# Patient Record
Sex: Female | Born: 1965 | Race: White | Hispanic: No | Marital: Married | Smoking: Never smoker
Health system: Southern US, Community
[De-identification: ages and names within clinical notes are randomized; demographics above are authoritative.]

## PROBLEM LIST (undated history)

## (undated) ENCOUNTER — Inpatient Hospital Stay: Admission: EM | Payer: Self-pay | Source: Home / Self Care

## (undated) DIAGNOSIS — I10 Essential (primary) hypertension: Secondary | ICD-10-CM

## (undated) DIAGNOSIS — L309 Dermatitis, unspecified: Secondary | ICD-10-CM

## (undated) DIAGNOSIS — D649 Anemia, unspecified: Secondary | ICD-10-CM

## (undated) DIAGNOSIS — M1990 Unspecified osteoarthritis, unspecified site: Secondary | ICD-10-CM

## (undated) DIAGNOSIS — R112 Nausea with vomiting, unspecified: Secondary | ICD-10-CM

## (undated) DIAGNOSIS — J45909 Unspecified asthma, uncomplicated: Secondary | ICD-10-CM

## (undated) DIAGNOSIS — I82409 Acute embolism and thrombosis of unspecified deep veins of unspecified lower extremity: Secondary | ICD-10-CM

## (undated) DIAGNOSIS — E882 Lipomatosis, not elsewhere classified: Secondary | ICD-10-CM

## (undated) DIAGNOSIS — R51 Headache: Secondary | ICD-10-CM

## (undated) DIAGNOSIS — F419 Anxiety disorder, unspecified: Secondary | ICD-10-CM

## (undated) DIAGNOSIS — N200 Calculus of kidney: Secondary | ICD-10-CM

## (undated) DIAGNOSIS — R079 Chest pain, unspecified: Secondary | ICD-10-CM

## (undated) DIAGNOSIS — K219 Gastro-esophageal reflux disease without esophagitis: Secondary | ICD-10-CM

## (undated) DIAGNOSIS — M797 Fibromyalgia: Secondary | ICD-10-CM

## (undated) DIAGNOSIS — J189 Pneumonia, unspecified organism: Secondary | ICD-10-CM

## (undated) DIAGNOSIS — Z9889 Other specified postprocedural states: Secondary | ICD-10-CM

## (undated) DIAGNOSIS — Z8249 Family history of ischemic heart disease and other diseases of the circulatory system: Secondary | ICD-10-CM

## (undated) HISTORY — PX: TONSILLECTOMY: SUR1361

## (undated) HISTORY — PX: VAGINAL HYSTERECTOMY: SUR661

## (undated) HISTORY — DX: Acute embolism and thrombosis of unspecified deep veins of unspecified lower extremity: I82.409

## (undated) HISTORY — PX: OTHER SURGICAL HISTORY: SHX169

## (undated) HISTORY — PX: REDUCTION MAMMAPLASTY: SUR839

## (undated) HISTORY — DX: Dermatitis, unspecified: L30.9

---

## 1998-09-22 ENCOUNTER — Encounter: Payer: Self-pay | Admitting: Emergency Medicine

## 1998-09-22 ENCOUNTER — Emergency Department (HOSPITAL_COMMUNITY): Admission: EM | Admit: 1998-09-22 | Discharge: 1998-09-22 | Payer: Self-pay | Admitting: Emergency Medicine

## 1998-11-15 ENCOUNTER — Emergency Department (HOSPITAL_COMMUNITY): Admission: EM | Admit: 1998-11-15 | Discharge: 1998-11-15 | Payer: Self-pay | Admitting: Emergency Medicine

## 1999-10-18 ENCOUNTER — Emergency Department (HOSPITAL_COMMUNITY): Admission: EM | Admit: 1999-10-18 | Discharge: 1999-10-18 | Payer: Self-pay | Admitting: Emergency Medicine

## 2001-12-06 ENCOUNTER — Encounter: Admission: RE | Admit: 2001-12-06 | Discharge: 2001-12-06 | Payer: Self-pay | Admitting: Family Medicine

## 2001-12-06 ENCOUNTER — Encounter: Payer: Self-pay | Admitting: Family Medicine

## 2003-08-07 ENCOUNTER — Encounter: Admission: RE | Admit: 2003-08-07 | Discharge: 2003-08-07 | Payer: Self-pay | Admitting: Family Medicine

## 2003-11-14 ENCOUNTER — Ambulatory Visit: Admission: RE | Admit: 2003-11-14 | Discharge: 2003-11-14 | Payer: Self-pay | Admitting: Emergency Medicine

## 2004-03-12 ENCOUNTER — Ambulatory Visit (HOSPITAL_COMMUNITY): Admission: RE | Admit: 2004-03-12 | Discharge: 2004-03-12 | Payer: Self-pay | Admitting: Emergency Medicine

## 2004-08-07 ENCOUNTER — Emergency Department (HOSPITAL_COMMUNITY): Admission: EM | Admit: 2004-08-07 | Discharge: 2004-08-07 | Payer: Self-pay | Admitting: Emergency Medicine

## 2005-02-22 ENCOUNTER — Encounter: Admission: RE | Admit: 2005-02-22 | Discharge: 2005-02-22 | Payer: Self-pay | Admitting: Family Medicine

## 2005-07-08 ENCOUNTER — Encounter: Admission: RE | Admit: 2005-07-08 | Discharge: 2005-07-08 | Payer: Self-pay | Admitting: Family Medicine

## 2006-04-25 ENCOUNTER — Encounter: Admission: RE | Admit: 2006-04-25 | Discharge: 2006-04-25 | Payer: Self-pay | Admitting: Family Medicine

## 2007-07-28 ENCOUNTER — Emergency Department (HOSPITAL_COMMUNITY): Admission: EM | Admit: 2007-07-28 | Discharge: 2007-07-28 | Payer: Self-pay | Admitting: Emergency Medicine

## 2007-07-28 ENCOUNTER — Ambulatory Visit: Payer: Self-pay | Admitting: Vascular Surgery

## 2007-08-16 ENCOUNTER — Ambulatory Visit: Payer: Self-pay | Admitting: Vascular Surgery

## 2009-04-28 ENCOUNTER — Encounter: Admission: RE | Admit: 2009-04-28 | Discharge: 2009-04-28 | Payer: Self-pay | Admitting: Internal Medicine

## 2009-05-15 ENCOUNTER — Encounter: Payer: Self-pay | Admitting: Neurology

## 2009-06-23 ENCOUNTER — Emergency Department (HOSPITAL_COMMUNITY): Admission: EM | Admit: 2009-06-23 | Discharge: 2009-06-23 | Payer: Self-pay | Admitting: Emergency Medicine

## 2009-06-30 ENCOUNTER — Ambulatory Visit (HOSPITAL_COMMUNITY): Admission: RE | Admit: 2009-06-30 | Discharge: 2009-06-30 | Payer: Self-pay | Admitting: Urology

## 2010-02-27 ENCOUNTER — Emergency Department (HOSPITAL_COMMUNITY): Admission: EM | Admit: 2010-02-27 | Discharge: 2010-02-27 | Payer: Self-pay | Admitting: Emergency Medicine

## 2010-03-02 ENCOUNTER — Emergency Department (HOSPITAL_COMMUNITY): Admission: EM | Admit: 2010-03-02 | Discharge: 2010-03-02 | Payer: Self-pay | Admitting: Family Medicine

## 2010-03-09 ENCOUNTER — Emergency Department (HOSPITAL_COMMUNITY): Admission: EM | Admit: 2010-03-09 | Discharge: 2010-03-09 | Payer: Self-pay | Admitting: Emergency Medicine

## 2010-03-16 ENCOUNTER — Emergency Department (HOSPITAL_COMMUNITY): Admission: EM | Admit: 2010-03-16 | Discharge: 2010-03-16 | Payer: Self-pay | Admitting: Emergency Medicine

## 2010-06-20 ENCOUNTER — Emergency Department (HOSPITAL_COMMUNITY): Admission: EM | Admit: 2010-06-20 | Discharge: 2010-06-20 | Payer: Self-pay | Admitting: Emergency Medicine

## 2010-08-23 ENCOUNTER — Encounter: Payer: Self-pay | Admitting: Neurology

## 2010-10-10 ENCOUNTER — Emergency Department (HOSPITAL_BASED_OUTPATIENT_CLINIC_OR_DEPARTMENT_OTHER)
Admission: EM | Admit: 2010-10-10 | Discharge: 2010-10-10 | Disposition: A | Discharge status: Home / Self Care | Payer: PRIVATE HEALTH INSURANCE | Attending: Emergency Medicine | Admitting: Emergency Medicine

## 2010-10-10 ENCOUNTER — Emergency Department (INDEPENDENT_AMBULATORY_CARE_PROVIDER_SITE_OTHER): Payer: PRIVATE HEALTH INSURANCE

## 2010-10-10 DIAGNOSIS — S060X1A Concussion with loss of consciousness of 30 minutes or less, initial encounter: Secondary | ICD-10-CM

## 2010-10-10 DIAGNOSIS — R209 Unspecified disturbances of skin sensation: Secondary | ICD-10-CM

## 2010-10-10 DIAGNOSIS — I10 Essential (primary) hypertension: Secondary | ICD-10-CM | POA: Insufficient documentation

## 2010-10-10 DIAGNOSIS — IMO0001 Reserved for inherently not codable concepts without codable children: Secondary | ICD-10-CM | POA: Insufficient documentation

## 2010-10-10 DIAGNOSIS — S0990XA Unspecified injury of head, initial encounter: Secondary | ICD-10-CM | POA: Insufficient documentation

## 2010-10-10 DIAGNOSIS — R51 Headache: Secondary | ICD-10-CM

## 2010-10-10 DIAGNOSIS — Y9239 Other specified sports and athletic area as the place of occurrence of the external cause: Secondary | ICD-10-CM | POA: Insufficient documentation

## 2010-10-10 DIAGNOSIS — Y92838 Other recreation area as the place of occurrence of the external cause: Secondary | ICD-10-CM | POA: Insufficient documentation

## 2010-10-10 DIAGNOSIS — Y9323 Activity, snow (alpine) (downhill) skiing, snow boarding, sledding, tobogganing and snow tubing: Secondary | ICD-10-CM | POA: Insufficient documentation

## 2010-10-13 LAB — DIFFERENTIAL
Basophils Absolute: 0 10*3/uL (ref 0.0–0.1)
Eosinophils Absolute: 0 10*3/uL (ref 0.0–0.7)
Eosinophils Relative: 0 % (ref 0–5)
Lymphocytes Relative: 14 % (ref 12–46)
Monocytes Absolute: 0.6 10*3/uL (ref 0.1–1.0)
Neutrophils Relative %: 80 % — ABNORMAL HIGH (ref 43–77)

## 2010-10-13 LAB — URINALYSIS, ROUTINE W REFLEX MICROSCOPIC
Bilirubin Urine: NEGATIVE
Hgb urine dipstick: NEGATIVE
Nitrite: NEGATIVE
Protein, ur: NEGATIVE mg/dL
Specific Gravity, Urine: 1.017 (ref 1.005–1.030)
pH: 6.5 (ref 5.0–8.0)

## 2010-10-13 LAB — BASIC METABOLIC PANEL
BUN: 14 mg/dL (ref 6–23)
CO2: 28 mEq/L (ref 19–32)
GFR calc Af Amer: >60 mL/min (ref >60)
Glucose, Bld: 82 mg/dL (ref 70–99)
Sodium: 141 mEq/L (ref 135–145)

## 2010-10-13 LAB — CBC
Hemoglobin: 13.5 g/dL (ref 12.0–15.0)
MCV: 96.9 fL (ref 78.0–100.0)
WBC: 10.5 10*3/uL (ref 4.0–10.5)

## 2010-11-04 LAB — CBC
HCT: 40.1 % (ref 36.0–46.0)
Hemoglobin: 13.3 g/dL (ref 12.0–15.0)
MCHC: 33 g/dL (ref 30.0–36.0)
MCV: 96.2 fL (ref 78.0–100.0)
Platelets: 279 10*3/uL (ref 150–400)
RBC: 4.17 MIL/uL (ref 3.87–5.11)
RDW: 12.3 % (ref 11.5–15.5)
WBC: 8.6 10*3/uL (ref 4.0–10.5)

## 2010-11-04 LAB — URINALYSIS, ROUTINE W REFLEX MICROSCOPIC
Bilirubin Urine: NEGATIVE
Glucose, UA: NEGATIVE mg/dL
Nitrite: NEGATIVE
Protein, ur: 100 mg/dL — AB
Specific Gravity, Urine: 1.024 (ref 1.005–1.030)
Urobilinogen, UA: 0.2 mg/dL (ref 0.0–1.0)
pH: 6 (ref 5.0–8.0)

## 2010-11-04 LAB — BASIC METABOLIC PANEL
BUN: 16 mg/dL (ref 6–23)
Calcium: 9.1 mg/dL (ref 8.4–10.5)
Chloride: 108 mEq/L (ref 96–112)
GFR calc non Af Amer: 46 mL/min — ABNORMAL LOW (ref >60)
Potassium: 3.7 mEq/L (ref 3.5–5.1)

## 2010-11-04 LAB — URINE MICROSCOPIC-ADD ON

## 2010-11-04 LAB — BASIC METABOLIC PANEL WITH GFR
CO2: 28 meq/L (ref 19–32)
Creatinine, Ser: 1.26 mg/dL — ABNORMAL HIGH (ref 0.4–1.2)
GFR calc Af Amer: 56 mL/min — ABNORMAL LOW (ref >60)
Glucose, Bld: 73 mg/dL (ref 70–99)
Sodium: 141 meq/L (ref 135–145)

## 2010-11-04 LAB — DIFFERENTIAL
Basophils Absolute: 0 10*3/uL (ref 0.0–0.1)
Basophils Relative: 0 % (ref 0–1)
Eosinophils Absolute: 0 K/uL (ref 0.0–0.7)
Eosinophils Relative: 0 % (ref 0–5)
Lymphocytes Relative: 32 % (ref 12–46)
Lymphs Abs: 2.8 10*3/uL (ref 0.7–4.0)
Monocytes Absolute: 0.5 K/uL (ref 0.1–1.0)
Monocytes Relative: 6 % (ref 3–12)
Neutro Abs: 5.3 10*3/uL (ref 1.7–7.7)
Neutrophils Relative %: 61 % (ref 43–77)

## 2010-12-15 NOTE — Assessment & Plan Note (Signed)
OFFICE VISIT   [name], [name]  DOB:  [birth year]                                       08/16/2007  BJYNW#:[id number]   The patient is a 45 year old female referred by Dr. Ranell Patrick for chronic  left leg swelling.  She sustained a left knee injury in August 2009  while parasailing.  At that time, she sustained a grade 2 medial  collateral ligament injury.  This was treated primarily with rest and  physical therapy.  She began to notice some swelling in her left foot  and ankle in October 2008.  She occasionally has some swelling in her  right leg.  She states the swelling is worse after being on her feet all  day.   PAST MEDICAL HISTORY:  Unremarkable.  She has no history of diabetes,  hypertension, or elevated cholesterol.   FAMILY HISTORY:  Remarkable for her father who had vascular disease at a  young age, as well as a heavy smoker.   SOCIAL HISTORY:  She is married.  Has 2 children.  Works as a [employer] scheme manager.  She does not smoke.  She drinks alcohol rarely.   REVIEW OF SYSTEMS:  She is 5 feet 5 inches and has had some recent  weight gain.  She has some occasional shortness of breath and  palpitations.  She denies history of asthma or wheezing.  She has a  history of mild reflux.  She denies history of renal insufficiency, TIA,  stroke, anxiety, or depression.  She has no recent changes in eyesight  or hearing.  She has occasional migraine headaches and fibromyalgia.   MEDICATIONS:  Include Flexeril 10 mg once a day.  She is allergic to  codeine, Z-Pak, and Vicodin.   PHYSICAL EXAM:  Blood pressure is 148/105, heart rate is 102.  Lower  extremity exam shows 2+ femoral, 1+ popliteal, and 1+ dorsalis pedis  pulses bilaterally.  Temperature of her feet is symmetric bilaterally.  There is trace edema in both lower extremities, but overall, these are  fairly symmetric.  She had a venous duplex ultrasound today, which  showed no evidence of DVT or venous  reflux bilaterally.   She has worn what sounds like some T.E.D. hose in the past, but did not  have much relief from these.   I believe the patient has some intermittent swelling in her left leg,  probably still related to her trauma.  However, she may also have some  component of venous insufficiency, although we did not detect any  incompetence of her veins on duplex today.  I believe the best option  for her is a prescribed 25-30 mm compression garment for her left leg.  She should get symptomatic relief from this, and hopefully, as her  injury continues to heal, the swelling will resolve.  I did discuss with  her that she may have some slight asymmetric swelling in her left leg  due to her previous injury.  She will follow up with me in 1 month's  time.  Based on her physical exam today, I do not believe she has any  component of arterial injury.   [name] Hora. Fields, MD  Electronically Signed   CEF/MEDQ  D:  08/17/2007  T:  08/17/2007  Job:  [id number]   cc:   Almedia Balls. Ranell Patrick, M.D.

## 2010-12-15 NOTE — Procedures (Signed)
DUPLEX DEEP VENOUS EXAM - LOWER EXTREMITY   INDICATION:  Right knee pain and left leg swelling.   HISTORY:  Edema:  Left leg.  Trauma/Surgery:  Patient had a traumatic injury in August, 2008.  Pain:  Right knee pain.  PE:  No.  Previous DVT:  No.  Anticoagulants:  No.  Other:   DUPLEX EXAM:                CFV   SFV   PopV  PTV    GSV                R  L  R  L  R  L  R   L  R  L  Thrombosis    o  o  o  o  o  o  o   o  o  o  Spontaneous   +  +  +  +  +  +  +   +  +  +  Phasic        +  +  +  +  +  +  +   +  +  +  Augmentation  +  +  +  +  +  +  +   +  +  +  Compressible  +  +  +  +  +  +  +   +  +  +  Competent     +  +  +  +  +  +  +   +  +  +   Legend:  + - yes  o - no  p - partial  D - decreased   IMPRESSION:  1. No evidence of deep or superficial venous thrombosis bilaterally.  2. No evidence of baker's cyst bilaterally.  3. No evidence of significant venous reflux bilaterally.    _____________________________  Janetta Hora Fields, MD   MC/MEDQ  D:  08/16/2007  T:  08/16/2007  Job:  [id number]

## 2012-06-23 ENCOUNTER — Other Ambulatory Visit: Payer: Self-pay | Admitting: Cardiovascular Disease

## 2012-07-11 ENCOUNTER — Encounter (HOSPITAL_COMMUNITY): Payer: Self-pay | Admitting: Pharmacy Technician

## 2012-07-11 ENCOUNTER — Encounter (HOSPITAL_COMMUNITY)
Admission: RE | Disposition: A | Discharge status: Home / Self Care | Payer: Self-pay | Source: Ambulatory Visit | Attending: Cardiovascular Disease

## 2012-07-11 ENCOUNTER — Ambulatory Visit (HOSPITAL_COMMUNITY)
Admission: RE | Admit: 2012-07-11 | Discharge: 2012-07-12 | Disposition: A | Discharge status: Home / Self Care | Payer: PRIVATE HEALTH INSURANCE | Source: Ambulatory Visit | Attending: Cardiovascular Disease | Admitting: Cardiovascular Disease

## 2012-07-11 DIAGNOSIS — Z8249 Family history of ischemic heart disease and other diseases of the circulatory system: Secondary | ICD-10-CM | POA: Insufficient documentation

## 2012-07-11 DIAGNOSIS — I10 Essential (primary) hypertension: Secondary | ICD-10-CM | POA: Insufficient documentation

## 2012-07-11 DIAGNOSIS — F419 Anxiety disorder, unspecified: Secondary | ICD-10-CM | POA: Diagnosis present

## 2012-07-11 DIAGNOSIS — R079 Chest pain, unspecified: Secondary | ICD-10-CM | POA: Insufficient documentation

## 2012-07-11 HISTORY — DX: Other specified postprocedural states: Z98.890

## 2012-07-11 HISTORY — DX: Essential (primary) hypertension: I10

## 2012-07-11 HISTORY — PX: LEFT HEART CATHETERIZATION WITH CORONARY ANGIOGRAM: SHX5451

## 2012-07-11 HISTORY — DX: Family history of ischemic heart disease and other diseases of the circulatory system: Z82.49

## 2012-07-11 HISTORY — PX: CARDIAC CATHETERIZATION: SHX172

## 2012-07-11 HISTORY — DX: Gastro-esophageal reflux disease without esophagitis: K21.9

## 2012-07-11 HISTORY — DX: Fibromyalgia: M79.7

## 2012-07-11 HISTORY — DX: Unspecified osteoarthritis, unspecified site: M19.90

## 2012-07-11 HISTORY — DX: Headache: R51

## 2012-07-11 HISTORY — DX: Other specified postprocedural states: R11.2

## 2012-07-11 HISTORY — DX: Chest pain, unspecified: R07.9

## 2012-07-11 HISTORY — DX: Anxiety disorder, unspecified: F41.9

## 2012-07-11 LAB — BASIC METABOLIC PANEL
BUN: 15 mg/dL (ref 6–23)
Chloride: 101 mEq/L (ref 96–112)
GFR calc non Af Amer: 71 mL/min — ABNORMAL LOW (ref >90)
Glucose, Bld: 91 mg/dL (ref 70–99)
Potassium: 3.9 mEq/L (ref 3.5–5.1)
Sodium: 139 mEq/L (ref 135–145)

## 2012-07-11 LAB — CBC
HCT: 41.3 % (ref 36.0–46.0)
Hemoglobin: 13.6 g/dL (ref 12.0–15.0)
MCHC: 32.9 g/dL (ref 30.0–36.0)
RBC: 4.39 MIL/uL (ref 3.87–5.11)
WBC: 6.7 10*3/uL (ref 4.0–10.5)

## 2012-07-11 LAB — PROTIME-INR: INR: 0.96 (ref 0.00–1.49)

## 2012-07-11 SURGERY — LEFT HEART CATHETERIZATION WITH CORONARY ANGIOGRAM
Anesthesia: LOCAL

## 2012-07-11 MED ORDER — SODIUM CHLORIDE 0.9 % IV SOLN
INTRAVENOUS | Status: DC
  Administered 2012-07-11: 15:00:00 via INTRAVENOUS

## 2012-07-11 MED ORDER — SODIUM CHLORIDE 0.9 % IV SOLN
INTRAVENOUS | Status: AC
  Administered 2012-07-11: 17:00:00 via INTRAVENOUS

## 2012-07-11 MED ORDER — NITROGLYCERIN 0.2 MG/ML ON CALL CATH LAB
INTRAVENOUS | Status: AC
  Filled 2012-07-11: qty 1

## 2012-07-11 MED ORDER — NON FORMULARY: Status: DC

## 2012-07-11 MED ORDER — HEPARIN (PORCINE) IN NACL 2-0.9 UNIT/ML-% IJ SOLN
INTRAMUSCULAR | Status: AC
  Filled 2012-07-11: qty 1000

## 2012-07-11 MED ORDER — OLMESARTAN MEDOXOMIL 20 MG PO TABS
20.0000 mg | ORAL_TABLET | Freq: Every day | ORAL | Status: DC
  Administered 2012-07-12: 09:00:00 20 mg via ORAL
  Filled 2012-07-11 (×3): qty 1

## 2012-07-11 MED ORDER — SODIUM CHLORIDE 0.9 % IJ SOLN: 3.0000 mL | INTRAMUSCULAR | Status: DC | PRN

## 2012-07-11 MED ORDER — CYCLOBENZAPRINE HCL 10 MG PO TABS
10.0000 mg | ORAL_TABLET | Freq: Every day | ORAL | Status: DC
  Administered 2012-07-11: 10 mg via ORAL
  Filled 2012-07-11 (×2): qty 1

## 2012-07-11 MED ORDER — DIAZEPAM 5 MG PO TABS: 5.0000 mg | ORAL_TABLET | ORAL | Status: DC

## 2012-07-11 MED ORDER — PANTOPRAZOLE SODIUM 40 MG PO TBEC
40.0000 mg | DELAYED_RELEASE_TABLET | Freq: Every day | ORAL | Status: DC
  Filled 2012-07-11: qty 1

## 2012-07-11 MED ORDER — MIDAZOLAM HCL 2 MG/2ML IJ SOLN
INTRAMUSCULAR | Status: AC
  Filled 2012-07-11: qty 2

## 2012-07-11 MED ORDER — ONDANSETRON HCL 4 MG/2ML IJ SOLN: 4.0000 mg | Freq: Four times a day (QID) | INTRAMUSCULAR | Status: DC | PRN

## 2012-07-11 MED ORDER — VERAPAMIL HCL 2.5 MG/ML IV SOLN
INTRAVENOUS | Status: AC
  Filled 2012-07-11: qty 2

## 2012-07-11 MED ORDER — FUROSEMIDE 40 MG PO TABS
40.0000 mg | ORAL_TABLET | Freq: Every day | ORAL | Status: DC | PRN
  Filled 2012-07-11: qty 1

## 2012-07-11 MED ORDER — FENTANYL CITRATE 0.05 MG/ML IJ SOLN
INTRAMUSCULAR | Status: AC
  Filled 2012-07-11: qty 2

## 2012-07-11 MED ORDER — ACETAMINOPHEN 325 MG PO TABS
650.0000 mg | ORAL_TABLET | ORAL | Status: DC | PRN
  Administered 2012-07-11 (×2): 650 mg via ORAL
  Filled 2012-07-11 (×2): qty 2

## 2012-07-11 MED ORDER — LIDOCAINE HCL (PF) 1 % IJ SOLN
INTRAMUSCULAR | Status: AC
  Filled 2012-07-11: qty 30

## 2012-07-11 MED ORDER — DIAZEPAM 5 MG PO TABS
ORAL_TABLET | ORAL | Status: AC
  Filled 2012-07-11: qty 1

## 2012-07-11 NOTE — H&P (Signed)
  H & P will be scanned in.  Pt was reexamined and existing H & P reviewed. No changes found.  Runell Gess, MD [facility] 07/11/2012 4:25 PM

## 2012-07-11 NOTE — Op Note (Signed)
[name] [name] is a 46 y.o. female    [id number] LOCATION:  FACILITY: [facility]  PHYSICIAN: Nanetta Batty, M.D. 10-08-1965   DATE OF PROCEDURE:  07/11/2012  DATE OF DISCHARGE:  [facility]  CARDIAC CATHETERIZATION     History obtained from chart review. [name] nd is a 46 year old married Caucasian female with a positive family history heart disease, history of hypertension and ongoing chest pain. She hast had a negative Myoview stress test. She presents now for diagnostic outpatient coronary arteriography to define her anatomy and rule out an ischemic etiology.   PROCEDURE DESCRIPTION:    The patient was brought to the second floor  [facility] cath lab in the postabsorptive state. She was premedicated with Valium 5 mg by mouth, IV Versed and fentanyl.. Her right wrist and groin Were prepped and shaved in usual sterile fashion. Xylocaine 1% was used for local anesthesia. A 5 French sheath was inserted into the right common femoral  artery using standard Seldinger technique. Initial attempts were made to access the right radial artery however the patient developed spasm which did not allow passage of the wire. 5 French right and left Judkins diagnostic catheters were used for selective coronary angiography obtain left heart pressures. Visipaque dye was used for the entirety of the case. Retrograde aortic, left ventricular and pulmonary pressures were recorded. Total contrast administered the patient was 25 cc.   HEMODYNAMICS:    AO SYSTOLIC/AO DIASTOLIC: 128/86   LV SYSTOLIC/LV DIASTOLIC: 131/16  ANGIOGRAPHIC RESULTS:   1. Left main; normal  2. LAD; normal 3. Left circumflex; normal.  4. Right coronary artery; dominant and normal 5. Left ventriculography; was not performed since the patient already had a 2-D echocardiogram revealing normal left ventricular function.  IMPRESSION:[name] has normal coronary arteries. I think her chest pain  is noncardiac. A femoral arterial angiogram was performed in her groin was sealed with a "MYNX" closure device achieving excellent hemostasis. The patient left the Cath Lab in stable condition. She'll be treated for noncardiac chest pain. Her primary care physician was notified of these results.  Runell Gess MD, [facility] 07/11/2012 5:16 PM

## 2012-07-12 ENCOUNTER — Encounter (HOSPITAL_COMMUNITY): Payer: Self-pay | Admitting: General Practice

## 2012-07-12 DIAGNOSIS — Z8249 Family history of ischemic heart disease and other diseases of the circulatory system: Secondary | ICD-10-CM

## 2012-07-12 DIAGNOSIS — F419 Anxiety disorder, unspecified: Secondary | ICD-10-CM | POA: Diagnosis present

## 2012-07-12 DIAGNOSIS — R079 Chest pain, unspecified: Secondary | ICD-10-CM

## 2012-07-12 DIAGNOSIS — I10 Essential (primary) hypertension: Secondary | ICD-10-CM | POA: Diagnosis present

## 2012-07-12 HISTORY — DX: Chest pain, unspecified: R07.9

## 2012-07-12 HISTORY — DX: Family history of ischemic heart disease and other diseases of the circulatory system: Z82.49

## 2012-07-12 NOTE — Discharge Summary (Signed)
Physician Discharge Summary  Patient ID: [name] [name] MRN: [id number] DOB/AGE: 05-Mar-1966 46 y.o.  Admit date: 07/11/2012 Discharge date: 07/12/2012  Discharge Diagnoses:  Principal Problem:  *Chest pain at rest, on going, probable GI source Active Problems:  HTN (hypertension)  Family history of early CAD  Anxiety  Procedures: cardiac cath by Dr. Allyson Sabal 07/11/12  Discharged Condition: good  Hospital Course: Ms. [name] [name] is a 46 year old married Caucasian female with a positive family history heart disease, history of hypertension and ongoing chest pain. She hast had a negative Myoview stress test. She presented for diagnostic outpatient coronary arteriography to define her anatomy and rule out an ischemic etiology.   Cardiac cath revealed patent coronary arteries.  Previous echo with normal LV function. She was observed overnight and found to be stable the morning of discharge.  Dr. Allyson Sabal saw and evaluated her and felt she was stable for discharge.   Consults: None  Significant Diagnostic Studies:  BMET    Component Value Date/Time   NA 139 07/11/2012 1500   K 3.9 07/11/2012 1500   CL 101 07/11/2012 1500   CO2 28 07/11/2012 1500   GLUCOSE 91 07/11/2012 1500   BUN 15 07/11/2012 1500   CREATININE 0.94 07/11/2012 1500   CALCIUM 9.0 07/11/2012 1500   GFRNONAA 71* 07/11/2012 1500   GFRAA 82* 07/11/2012 1500    CBC    Component Value Date/Time   WBC 6.7 07/11/2012 1500   RBC 4.39 07/11/2012 1500   HGB 13.6 07/11/2012 1500   HCT 41.3 07/11/2012 1500   PLT 265 07/11/2012 1500   MCV 94.1 07/11/2012 1500   MCH 31.0 07/11/2012 1500   MCHC 32.9 07/11/2012 1500   RDW 12.4 07/11/2012 1500   LYMPHSABS 1.5 06/20/2010 1140   MONOABS 0.6 06/20/2010 1140   EOSABS 0.0 06/20/2010 1140   BASOSABS 0.0 06/20/2010 1140       Discharge Exam: Blood pressure 100/58, pulse 77, temperature 98 F (36.7 C), temperature source Oral, resp. rate 15, height 5\' 5"  (1.651 m), weight 77.111  kg (170 lb), SpO2 100.00%.   Exam per Dr. Allyson Sabal at discharge:  Physical Exam:  General appearance: alert, cooperative and no distress  Neck: no adenopathy, no carotid bruit, no JVD, supple, symmetrical, trachea midline and thyroid not enlarged, symmetric, no tenderness/mass/nodules  Lungs: clear to auscultation bilaterally  Heart: regular rate and rhythm, S1, S2 normal, no murmur, click, rub or gallop  Extremities: extremities normal, atraumatic, no cyanosis or edema and right groin OK  Pulses: 2+ and symmetric  2+ RPP  Disposition: 01-Home or Self Care     Medication List     As of 07/12/2012  5:31 PM    TAKE these medications         cyclobenzaprine 10 MG tablet   Commonly known as: FLEXERIL   Take 10 mg by mouth at bedtime.      furosemide 40 MG tablet   Commonly known as: LASIX   Take 40 mg by mouth daily as needed. For edema      olmesartan 20 MG tablet   Commonly known as: BENICAR   Take 20 mg by mouth daily.      RABEprazole 20 MG tablet   Commonly known as: ACIPHEX   Take 20 mg by mouth daily.         Follow-up Information    Follow up with HAGER, BRYAN, PA. On 07/14/2012. (at 2:00 pm, at Dr. Hazle Coca office)    Contact information:   [address] [place] [address] [phone]        Discharge instructions: Call [facility] if any bleeding, swelling or drainage at cath site.  May shower, no tub baths for 48 hours for groin sticks.   Heart Healthy Diet  No lifting over 5 pounds for 3 days  No driving for 2 days. SignedLeone Brand 07/12/2012, 5:31 PM

## 2012-07-12 NOTE — Progress Notes (Signed)
Utilization Review Completed.   Leanza Shepperson, RN, BSN Nurse Case Manager  [phone]  

## 2012-07-12 NOTE — Progress Notes (Signed)
Subjective:  No CP/SOB. Mild Right groin pain  Objective:  Temp:  [97.6 F (36.4 C)-98.6 F (37 C)] 98 F (36.7 C) (12/11 0815) Pulse Rate:  [77] 77  (12/10 1522) Resp:  [12-21] 15  (12/11 0815) BP: (91-117)/(47-85) 100/58 mmHg (12/11 0815) SpO2:  [99 %-100 %] 100 % (12/11 0815) Weight:  [77.111 kg (170 lb)] 77.111 kg (170 lb) (12/10 1524) Weight change:   Intake/Output from previous day: 12/10 0701 - 12/11 0700 In: 292.5 [I.V.:292.5] Out: -   Intake/Output from this shift:    Physical Exam: General appearance: alert, cooperative and no distress Neck: no adenopathy, no carotid bruit, no JVD, supple, symmetrical, trachea midline and thyroid not enlarged, symmetric, no tenderness/mass/nodules Lungs: clear to auscultation bilaterally Heart: regular rate and rhythm, S1, S2 normal, no murmur, click, rub or gallop Extremities: extremities normal, atraumatic, no cyanosis or edema and right groin OK Pulses: 2+ and symmetric 2+ RPP  Lab Results: Results for orders placed during the hospital encounter of 07/11/12 (from the past 48 hour(s))  CBC     Status: Normal   Collection Time   07/11/12  3:00 PM      Component Value Range Comment   WBC 6.7  4.0 - 10.5 K/uL    RBC 4.39  3.87 - 5.11 MIL/uL    Hemoglobin 13.6  12.0 - 15.0 g/dL    HCT 40.9  81.1 - 91.4 %    MCV 94.1  78.0 - 100.0 fL    MCH 31.0  26.0 - 34.0 pg    MCHC 32.9  30.0 - 36.0 g/dL    RDW 78.2  95.6 - 21.3 %    Platelets 265  150 - 400 K/uL   BASIC METABOLIC PANEL     Status: Abnormal   Collection Time   07/11/12  3:00 PM      Component Value Range Comment   Sodium 139  135 - 145 mEq/L    Potassium 3.9  3.5 - 5.1 mEq/L    Chloride 101  96 - 112 mEq/L    CO2 28  19 - 32 mEq/L    Glucose, Bld 91  70 - 99 mg/dL    BUN 15  6 - 23 mg/dL    Creatinine, Ser 0.86  0.50 - 1.10 mg/dL    Calcium 9.0  8.4 - 57.8 mg/dL    GFR calc non Af Amer 71 (*) >90 mL/min    GFR calc Af Amer 82 (*) >90 mL/min   PROTIME-INR      Status: Normal   Collection Time   07/11/12  3:00 PM      Component Value Range Comment   Prothrombin Time 12.7  11.6 - 15.2 seconds    INR 0.96  0.00 - 1.49     Imaging: Imaging results have been reviewed  Assessment/Plan:   1. Active Problems: 2.  * No active hospital problems. *  3.   Time Spent Directly with Patient:  20 minutes  Length of Stay:  LOS: 1 day   Nl cath. MYNX closure RFA. 2+ RPP. Labs OK. Medical therapy. D/C home this AM. ROV later this week or next (can see an extender) for groin check then back PRN.  Runell Gess 07/12/2012, 9:44 AM

## 2012-11-07 ENCOUNTER — Other Ambulatory Visit: Payer: Self-pay | Admitting: Internal Medicine

## 2012-11-07 DIAGNOSIS — N644 Mastodynia: Secondary | ICD-10-CM

## 2012-11-08 ENCOUNTER — Other Ambulatory Visit: Payer: Self-pay | Admitting: Internal Medicine

## 2012-11-08 DIAGNOSIS — N644 Mastodynia: Secondary | ICD-10-CM

## 2012-11-09 ENCOUNTER — Other Ambulatory Visit: Payer: Self-pay | Admitting: Internal Medicine

## 2012-11-09 ENCOUNTER — Ambulatory Visit
Admission: RE | Admit: 2012-11-09 | Discharge: 2012-11-09 | Disposition: A | Discharge status: Home / Self Care | Payer: PRIVATE HEALTH INSURANCE | Source: Ambulatory Visit | Attending: Internal Medicine | Admitting: Internal Medicine

## 2012-11-09 DIAGNOSIS — N644 Mastodynia: Secondary | ICD-10-CM

## 2013-01-04 ENCOUNTER — Ambulatory Visit
Admission: RE | Admit: 2013-01-04 | Discharge: 2013-01-04 | Disposition: A | Discharge status: Home / Self Care | Payer: PRIVATE HEALTH INSURANCE | Source: Ambulatory Visit | Attending: Allergy | Admitting: Allergy

## 2013-01-04 ENCOUNTER — Other Ambulatory Visit: Payer: Self-pay | Admitting: Allergy

## 2013-01-04 DIAGNOSIS — J329 Chronic sinusitis, unspecified: Secondary | ICD-10-CM

## 2013-08-02 HISTORY — PX: OTHER SURGICAL HISTORY: SHX169

## 2014-06-21 ENCOUNTER — Other Ambulatory Visit: Payer: Self-pay | Admitting: Internal Medicine

## 2014-06-21 DIAGNOSIS — N644 Mastodynia: Secondary | ICD-10-CM

## 2014-07-09 ENCOUNTER — Other Ambulatory Visit: Payer: PRIVATE HEALTH INSURANCE

## 2014-07-11 ENCOUNTER — Encounter (HOSPITAL_COMMUNITY): Payer: Self-pay | Admitting: Cardiovascular Disease

## 2014-07-29 ENCOUNTER — Other Ambulatory Visit: Payer: Self-pay | Admitting: Internal Medicine

## 2014-07-30 DIAGNOSIS — R102 Pelvic and perineal pain: Secondary | ICD-10-CM | POA: Insufficient documentation

## 2014-09-06 ENCOUNTER — Ambulatory Visit
Admission: RE | Admit: 2014-09-06 | Discharge: 2014-09-06 | Disposition: A | Discharge status: Home / Self Care | Payer: PRIVATE HEALTH INSURANCE | Source: Ambulatory Visit | Attending: Internal Medicine | Admitting: Internal Medicine

## 2014-09-06 ENCOUNTER — Encounter (INDEPENDENT_AMBULATORY_CARE_PROVIDER_SITE_OTHER): Payer: Self-pay

## 2014-09-06 DIAGNOSIS — N644 Mastodynia: Secondary | ICD-10-CM

## 2014-11-03 DIAGNOSIS — E785 Hyperlipidemia, unspecified: Secondary | ICD-10-CM | POA: Insufficient documentation

## 2015-04-03 ENCOUNTER — Other Ambulatory Visit: Payer: Self-pay | Admitting: Physician Assistant

## 2015-04-03 ENCOUNTER — Ambulatory Visit
Admission: RE | Admit: 2015-04-03 | Discharge: 2015-04-03 | Disposition: A | Discharge status: Home / Self Care | Payer: PRIVATE HEALTH INSURANCE | Source: Ambulatory Visit | Attending: Physician Assistant | Admitting: Physician Assistant

## 2015-04-03 DIAGNOSIS — E669 Obesity, unspecified: Secondary | ICD-10-CM

## 2015-04-03 DIAGNOSIS — R8290 Unspecified abnormal findings in urine: Secondary | ICD-10-CM

## 2015-04-03 DIAGNOSIS — R1031 Right lower quadrant pain: Secondary | ICD-10-CM

## 2015-07-13 ENCOUNTER — Ambulatory Visit (HOSPITAL_COMMUNITY)
Admission: RE | Admit: 2015-07-13 | Discharge: 2015-07-13 | Disposition: A | Discharge status: Home / Self Care | Payer: PRIVATE HEALTH INSURANCE | Source: Ambulatory Visit | Attending: Emergency Medicine | Admitting: Emergency Medicine

## 2015-07-13 ENCOUNTER — Other Ambulatory Visit (HOSPITAL_COMMUNITY): Payer: Self-pay | Admitting: Emergency Medicine

## 2015-07-13 DIAGNOSIS — M79605 Pain in left leg: Secondary | ICD-10-CM

## 2015-07-13 DIAGNOSIS — M7989 Other specified soft tissue disorders: Secondary | ICD-10-CM | POA: Diagnosis not present

## 2015-09-11 ENCOUNTER — Emergency Department (HOSPITAL_COMMUNITY)
Admission: EM | Admit: 2015-09-11 | Discharge: 2015-09-11 | Disposition: A | Discharge status: Home / Self Care | Payer: PRIVATE HEALTH INSURANCE | Attending: Surgery | Admitting: Surgery

## 2015-09-11 ENCOUNTER — Emergency Department (HOSPITAL_COMMUNITY): Payer: PRIVATE HEALTH INSURANCE

## 2015-09-11 ENCOUNTER — Encounter (HOSPITAL_COMMUNITY): Payer: Self-pay

## 2015-09-11 DIAGNOSIS — Z9889 Other specified postprocedural states: Secondary | ICD-10-CM | POA: Insufficient documentation

## 2015-09-11 DIAGNOSIS — Z79899 Other long term (current) drug therapy: Secondary | ICD-10-CM | POA: Diagnosis not present

## 2015-09-11 DIAGNOSIS — I10 Essential (primary) hypertension: Secondary | ICD-10-CM | POA: Diagnosis not present

## 2015-09-11 DIAGNOSIS — M549 Dorsalgia, unspecified: Secondary | ICD-10-CM | POA: Insufficient documentation

## 2015-09-11 DIAGNOSIS — G43909 Migraine, unspecified, not intractable, without status migrainosus: Secondary | ICD-10-CM | POA: Diagnosis not present

## 2015-09-11 DIAGNOSIS — K219 Gastro-esophageal reflux disease without esophagitis: Secondary | ICD-10-CM | POA: Insufficient documentation

## 2015-09-11 DIAGNOSIS — Z90710 Acquired absence of both cervix and uterus: Secondary | ICD-10-CM | POA: Insufficient documentation

## 2015-09-11 DIAGNOSIS — R109 Unspecified abdominal pain: Secondary | ICD-10-CM | POA: Insufficient documentation

## 2015-09-11 DIAGNOSIS — Z8659 Personal history of other mental and behavioral disorders: Secondary | ICD-10-CM | POA: Insufficient documentation

## 2015-09-11 DIAGNOSIS — Z8739 Personal history of other diseases of the musculoskeletal system and connective tissue: Secondary | ICD-10-CM | POA: Diagnosis not present

## 2015-09-11 DIAGNOSIS — R52 Pain, unspecified: Secondary | ICD-10-CM

## 2015-09-11 LAB — COMPREHENSIVE METABOLIC PANEL
ALT: 14 U/L (ref 14–54)
AST: 20 U/L (ref 15–41)
Albumin: 3.9 g/dL (ref 3.5–5.0)
Alkaline Phosphatase: 70 U/L (ref 38–126)
Anion gap: 11 (ref 5–15)
BUN: 16 mg/dL (ref 6–20)
CO2: 25 mmol/L (ref 22–32)
Calcium: 8.9 mg/dL (ref 8.9–10.3)
Chloride: 107 mmol/L (ref 101–111)
Creatinine, Ser: 0.94 mg/dL (ref 0.44–1.00)
GFR calc Af Amer: >60 mL/min (ref >60)
GFR calc non Af Amer: >60 mL/min (ref >60)
Glucose, Bld: 112 mg/dL — ABNORMAL HIGH (ref 65–99)
Potassium: 3.9 mmol/L (ref 3.5–5.1)
Sodium: 143 mmol/L (ref 135–145)
Total Bilirubin: 0.5 mg/dL (ref 0.3–1.2)
Total Protein: 6.6 g/dL (ref 6.5–8.1)

## 2015-09-11 LAB — CBC WITH DIFFERENTIAL/PLATELET
Basophils Absolute: 0 10*3/uL (ref 0.0–0.1)
Basophils Relative: 0 %
Eosinophils Absolute: 0.1 10*3/uL (ref 0.0–0.7)
Eosinophils Relative: 2 %
HCT: 43.1 % (ref 36.0–46.0)
Hemoglobin: 13.9 g/dL (ref 12.0–15.0)
Lymphocytes Relative: 35 %
Lymphs Abs: 2.4 10*3/uL (ref 0.7–4.0)
MCH: 30.5 pg (ref 26.0–34.0)
MCHC: 32.3 g/dL (ref 30.0–36.0)
MCV: 94.7 fL (ref 78.0–100.0)
Monocytes Absolute: 0.5 10*3/uL (ref 0.1–1.0)
Monocytes Relative: 7 %
Neutro Abs: 3.7 10*3/uL (ref 1.7–7.7)
Neutrophils Relative %: 56 %
Platelets: 275 10*3/uL (ref 150–400)
RBC: 4.55 MIL/uL (ref 3.87–5.11)
RDW: 12.9 % (ref 11.5–15.5)
WBC: 6.7 10*3/uL (ref 4.0–10.5)

## 2015-09-11 LAB — URINALYSIS, ROUTINE W REFLEX MICROSCOPIC
Bilirubin Urine: NEGATIVE
Glucose, UA: NEGATIVE mg/dL
Hgb urine dipstick: NEGATIVE
Ketones, ur: NEGATIVE mg/dL
Leukocytes, UA: NEGATIVE
Nitrite: NEGATIVE
Protein, ur: NEGATIVE mg/dL
Specific Gravity, Urine: 1.022 (ref 1.005–1.030)
pH: 6 (ref 5.0–8.0)

## 2015-09-11 LAB — LIPASE, BLOOD: Lipase: 42 U/L (ref 11–51)

## 2015-09-11 MED ORDER — ONDANSETRON HCL 4 MG/2ML IJ SOLN
4.0000 mg | Freq: Once | INTRAMUSCULAR | Status: AC
  Administered 2015-09-11: 4 mg via INTRAVENOUS
  Filled 2015-09-11: qty 2

## 2015-09-11 MED ORDER — SODIUM CHLORIDE 0.9 % IV BOLUS (SEPSIS)
1000.0000 mL | Freq: Once | INTRAVENOUS | Status: AC
  Administered 2015-09-11: 1000 mL via INTRAVENOUS

## 2015-09-11 MED ORDER — KETOROLAC TROMETHAMINE 30 MG/ML IJ SOLN
30.0000 mg | Freq: Once | INTRAMUSCULAR | Status: AC
  Administered 2015-09-11: 30 mg via INTRAVENOUS
  Filled 2015-09-11: qty 1

## 2015-09-11 MED ORDER — HYDROCODONE-ACETAMINOPHEN 7.5-325 MG/15ML PO SOLN: 5.0000 mL | Freq: Four times a day (QID) | ORAL | Status: AC | PRN

## 2015-09-11 MED ORDER — HYDROCODONE-ACETAMINOPHEN 7.5-325 MG/15ML PO SOLN
10.0000 mL | Freq: Once | ORAL | Status: AC
  Administered 2015-09-11: 5 mL via ORAL
  Filled 2015-09-11: qty 15

## 2015-09-11 NOTE — Consult Note (Signed)
[name] [name] is an 50 y.o. female.   Chief Complaint:  Right flank pain with cholelithiasis PCP:  Marton Redwood, MD  CARDIOLOGY:  DR. Quay Burow  HPI: Pt presents to the ED with right flank pain that started around 10-11 PM last night.  She says it's like sleeping on potatoes or rocks.  She also reported some sharp pain.  At first she thought it was a muscle strain then the symptoms more were  like pain with prior kidney stones.  She describes more pain in her groin than with prior kidney stones.  Pain persisted and she came to the ED.  Work up in the ED shows she is afebrile, VSS.  CMP is normal, CBC is normal, U/A is also normal.    Ultrasound show Multiple gallstones, no significant GB wall thickening.  Some minimal pericholecystic fluid. Negative Murphy's sign, CBD 3.5 mm, normal liver.     She has another [place] from 04/03/15 that showed multiple gallstones, GB wall thickness 2.8 cm, no fluid described.  CBD 4.8 mm at that time. Right kidney with calculi and scaring of cortex, left mid calculi noted.    We got a CT scan that showed: no calculi or hydronephrosis, normal liver, no calcified gallstones, borderline distension; the edema seen on the [place] was not as apparent on CT as it was on [place].    Discussed with Dr. Lucia Gaskins and he recommended HIDA scan.  Unfortunately she just had some pain med and cannot have the HIDA done today.       Past Medical History   Diagnosis  Date   .  PONV (postoperative nausea and vomiting)     .  Chest pain     .  GERD (gastroesophageal reflux disease)     .  Headache(784.0)  Migraines         "often; not daily" (07/12/2012)   .  Migraine     .  Arthritis         "hands" (07/12/2012)   .  Fibromyalgia     .  Anxiety     .  Chest pain at rest, on going  07/12/2012   .  Family history of early CAD  07/12/2012       Past Surgical History   Procedure  Laterality  Date   .  Cardiac catheterization    07/11/2012   .  Tonsillectomy    ~ 1976   .  Vaginal  hysterectomy    ~ 2009   .  Left heart catheterization with coronary angiogram  N/A  07/11/2012       Procedure: LEFT HEART CATHETERIZATION WITH CORONARY ANGIOGRAM;  Surgeon: Lorretta Harp, MD;  Location: [facility] CATH LAB;  Service: Cardiovascular;  Laterality: N/A;     History reviewed. No pertinent family history. Social History:  reports that she has never smoked. She has never used smokeless tobacco. She reports that she drinks alcohol. She reports that she does not use illicit drugs.   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     Allergies:   Allergies   Allergen  Reactions   .  Erythromycin  Other (See Comments)       ABD pain   .  Zithromax [Azithromycin]  Other (See Comments)       ABD PAIN   .  Morphine And Related  Nausea And Vomiting   .  Tramadol  Nausea Only       Prior to Admission medications    Medication  Sig  Start Date  End Date  Taking?  Authorizing Provider   BIOTIN PO  Take 1 tablet by mouth daily.      Yes  Historical Provider, MD   budesonide-formoterol (SYMBICORT) 80-4.5 MCG/ACT inhaler  Inhale 2 puffs into the lungs daily.      Yes  Historical Provider, MD   calcium-vitamin D (OSCAL WITH D) 500-200 MG-UNIT tablet  Take 1 tablet by mouth daily with breakfast.      Yes  Historical Provider, MD   cyclobenzaprine (FLEXERIL) 10 MG tablet  Take 10 mg by mouth at bedtime.      Yes  Historical Provider, MD   estradiol (ESTRACE) 1 MG tablet  Take 1 mg by mouth daily.  09/03/15    Yes  Historical Provider, MD   levocetirizine (XYZAL) 5 MG tablet  Take 5 mg by mouth every evening.      Yes  Historical Provider, MD   montelukast (SINGULAIR) 10 MG tablet  Take 10 mg by mouth at bedtime.      Yes  Historical Provider, MD   Multiple Vitamin (MULTIVITAMIN WITH MINERALS) TABS tablet  Take 1 tablet by mouth daily.      Yes  Historical Provider, MD   Omega-3 Fatty Acids (FISH OIL PO)  Take 1 capsule by mouth daily.       Yes  Historical Provider, MD   PRESCRIPTION MEDICATION  3 (three) times a week. *Allergy Shots*      Yes  Historical Provider, MD   RABEprazole (ACIPHEX) 20 MG tablet  Take 20 mg by mouth daily.      Yes  Historical Provider, MD   rizatriptan (MAXALT-MLT) 10 MG disintegrating tablet  Take 1 tablet by mouth every 2 (two) hours as needed for migraine.   06/18/15    Yes  Historical Provider, MD   VITAMIN E PO  Take 1 tablet by mouth daily.      Yes  Historical Provider, MD         Lab Results Last 48 Hours    Results for orders placed or performed during the hospital encounter of 09/11/15 (from the past 48 hour(s))   Urinalysis, Routine w reflex microscopic (not at [facility])     Status: None     Collection Time: 09/11/15  8:27 AM   Result  Value  Ref Range     Color, Urine  YELLOW  YELLOW     APPearance  CLEAR  CLEAR     Specific Gravity, Urine  1.022  1.005 - 1.030     pH  6.0  5.0 - 8.0     Glucose, UA  NEGATIVE  NEGATIVE mg/dL     Hgb urine dipstick  NEGATIVE  NEGATIVE     Bilirubin Urine  NEGATIVE  NEGATIVE     Ketones, ur  NEGATIVE  NEGATIVE mg/dL     Protein, ur  NEGATIVE  NEGATIVE mg/dL     Nitrite  NEGATIVE  NEGATIVE     Leukocytes, UA  NEGATIVE  NEGATIVE       Comment:  MICROSCOPIC NOT DONE ON URINES WITH NEGATIVE PROTEIN, BLOOD, LEUKOCYTES, NITRITE, OR GLUCOSE <1000 mg/dL.   CBC with Differential     Status: None     Collection Time: 09/11/15  9:11 AM   Result  Value  Ref Range     WBC  6.7  4.0 -  10.5 K/uL     RBC  4.55  3.87 - 5.11 MIL/uL     Hemoglobin  13.9  12.0 - 15.0 g/dL     HCT  43.1  36.0 - 46.0 %     MCV  94.7  78.0 - 100.0 fL     MCH  30.5  26.0 - 34.0 pg     MCHC  32.3  30.0 - 36.0 g/dL     RDW  12.9  11.5 - 15.5 %     Platelets  275  150 - 400 K/uL     Neutrophils Relative %  56  %     Neutro Abs  3.7  1.7 - 7.7 K/uL     Lymphocytes Relative  35  %     Lymphs Abs  2.4  0.7 - 4.0 K/uL     Monocytes Relative  7  %     Monocytes Absolute  0.5  0.1 - 1.0 K/uL      Eosinophils Relative  2  %     Eosinophils Absolute  0.1  0.0 - 0.7 K/uL     Basophils Relative  0  %     Basophils Absolute  0.0  0.0 - 0.1 K/uL   Comprehensive metabolic panel     Status: Abnormal     Collection Time: 09/11/15  9:12 AM   Result  Value  Ref Range     Sodium  143  135 - 145 mmol/L     Potassium  3.9  3.5 - 5.1 mmol/L     Chloride  107  101 - 111 mmol/L     CO2  25  22 - 32 mmol/L     Glucose, Bld  112 (H)  65 - 99 mg/dL     BUN  16  6 - 20 mg/dL     Creatinine, Ser  0.94  0.44 - 1.00 mg/dL     Calcium  8.9  8.9 - 10.3 mg/dL     Total Protein  6.6  6.5 - 8.1 g/dL     Albumin  3.9  3.5 - 5.0 g/dL     AST  20  15 - 41 U/L     ALT  14  14 - 54 U/L     Alkaline Phosphatase  70  38 - 126 U/L     Total Bilirubin  0.5  0.3 - 1.2 mg/dL     GFR calc non Af Amer  >60  >60 mL/min     GFR calc Af Amer  >60  >60 mL/min       Comment:  (NOTE)  The eGFR has been calculated using the CKD EPI equation. This calculation has not been validated in all clinical situations. eGFR's persistently <60 mL/min signify possible Chronic Kidney Disease.      Anion gap  11  5 - 15   Lipase, blood     Status: None     Collection Time: 09/11/15  9:12 AM   Result  Value  Ref Range     Lipase  42  11 - 51 U/L       Imaging Results (Last 48 hours)    US Abdomen Complete  09/11/2015  CLINICAL DATA:  Right flank pain for 1 day EXAM: ABDOMEN ULTRASOUND COMPLETE COMPARISON:  None. FINDINGS: Gallbladder: Multiple gallstones are identified. No significant wall thickening is seen. Some minimal pericholecystic fluid is noted. Negative sonographic Percell Miller sign is noted although the patient  has been medicated. Common bile duct: Diameter: 3.5 mm. Liver: No focal lesion identified. Within normal limits in parenchymal echogenicity. IVC: No abnormality visualized. Pancreas: Not well visualized due to overlying bowel gas. Spleen: Size and appearance within normal limits. Right Kidney: Length: 10.9 cm.  Echogenicity within normal limits. No mass or hydronephrosis visualized. Left Kidney: Length: 10.6 cm. Echogenicity within normal limits. No mass or hydronephrosis visualized. Abdominal aorta: No aneurysm visualized. Other findings: None. IMPRESSION: Multiple gallstones with evidence of pericholecystic fluid. In the appropriate clinical setting this could represent acute cholecystitis. No other focal abnormality is noted. Electronically Signed   By: Inez Catalina M.D.   On: 09/11/2015 12:15      Review of Systems  Constitutional: Negative.   HENT: Negative for congestion, ear discharge, ear pain, hearing loss, nosebleeds, sore throat and tinnitus.   Eyes: Negative.   Respiratory: Positive for cough (dry cought associated with allergies). Negative for hemoptysis, sputum production, shortness of breath, wheezing and stridor.   Cardiovascular: Positive for chest pain (occasional chest pain associated with stress, prior cath 2013, Dr. Lorie Phenix was normal). Negative for palpitations, orthopnea, claudication, leg swelling and PND.  Gastrointestinal: Positive for nausea (just once so far with this episode, she reports having episodes of morning sickness, not associated with PO intake .  on and off for a few months.) and abdominal pain (pain is really back right flank.  she has some chronic pain RLQ since her hysterectomy.  Not related). Negative for heartburn, vomiting, diarrhea, constipation, blood in stool and melena.  Genitourinary: Negative.   Musculoskeletal:        Fibromyalgia affects upper body hands and arms.  Skin: Negative.   Neurological: Negative.  Headaches: occasional migraines.  Endo/Heme/Allergies: Negative.   Psychiatric/Behavioral: Negative.     Blood pressure 122/81, pulse 74, temperature 97.8 F (36.6 C), temperature source Oral, resp. rate 16, height _0  (1.651 m), SpO2 95 %. Physical Exam  Constitutional: She is oriented to person, place, and time. She appears  well-developed and well-nourished. No distress.  HENT:   Head: Normocephalic and atraumatic.   Nose: Nose normal.  Eyes: Conjunctivae and EOM are normal. Right eye exhibits no discharge. Left eye exhibits no discharge.  Neck: Normal range of motion. Neck supple. No JVD present. No tracheal deviation present. No thyromegaly present.  Cardiovascular: Normal rate, regular rhythm, normal heart sounds and intact distal pulses.    No murmur heard. Respiratory: Effort normal and breath sounds normal. No respiratory distress. She has no wheezes. She has no rales. She exhibits no tenderness.  GI: Soft. Bowel sounds are normal. She exhibits no distension and no mass. There is tenderness. There is no rebound and no guarding.  Her primary pain is in her back right lateral flank.  She has some chronic tenderness over the RLQ that has been present since her hysterectomy.  She is somewhat tender to deep palpation over the RUQ.  Musculoskeletal: She exhibits no edema or tenderness.  Lymphadenopathy:    She has no cervical adenopathy.  Neurological: She is alert and oriented to person, place, and time. No cranial nerve deficit.  Skin: Skin is warm and dry. No rash noted. She is not diaphoretic. No erythema. No pallor.  Psychiatric: She has a normal mood and affect. Her behavior is normal. Judgment and thought content normal.     Assessment/Plan Right flank pain and history of nephrolithiasis Cholelithiasis, with some pericholecystic fluid, normal CBC, normal LFT's and lipase Hx of chest pain with  normal cardiac cath 2013 Hx of migraines Hx of arthritis in her hands and Fibromyalgia, upper body, hands and arms GERD Significant narcotic intolerance Allergy induced asthmatic symptoms  Plan:  Pt felt better with some pain medicine, and Zofran that she took before coming to the ED.  She  did not want to stay in the hospital for further work up.  We have arranged for her to get a HIDA scan tomorrow, as an  outpatient.  She is scheduled to see Dr. Ninfa Linden as a new patient on Monday 09/15/15.  Appointment is at 9:15 AM and she is to be at the office at 8:45 AM for check in.  If she has more symptoms and does not do well in the interim, she is to come back to the ED for further evaluation and work up.  She knows to not eat or take any pain medicine for 6 hours prior to her planned HIDA scan tomorrow.     Justice Deeds 09/11/2015, 12:57 PM            Revision History      Date/Time User Provider Type Action    09/11/2015  4:03 PM Earnstine Regal, PA-C Physician Assistant Sign    09/11/2015  4:00 PM Earnstine Regal, PA-C Physician Assistant Sign    View Details Report        Routing History      Date/Time From To Method    09/11/2015  4:04 PM Earnstine Regal, PA-C Coralie Keens, MD In Basket    09/11/2015  4:04 PM Earnstine Regal, PA-C Marton Redwood, MD Fax              Agree with above. It is not entirely clear what is going on.  The HIDA should show if her symptoms are related to her gall bladder.  Alphonsa Overall, MD, [facility] Pager: [phone] Office phone:  [phone]

## 2015-09-11 NOTE — Discharge Instructions (Signed)
Return tomorrow for your HIDA scan. Do not take pain medication (ibuprofen is fine) after midnight tonight.    Abdominal Pain, Adult Many things can cause abdominal pain. Usually, abdominal pain is not caused by a disease and will improve without treatment. It can often be observed and treated at home. Your health care provider will do a physical exam and possibly order blood tests and X-rays to help determine the seriousness of your pain. However, in many cases, more time must pass before a clear cause of the pain can be found. Before that point, your health care provider may not know if you need more testing or further treatment. HOME CARE INSTRUCTIONS Monitor your abdominal pain for any changes. The following actions may help to alleviate any discomfort you are experiencing:  Only take over-the-counter or prescription medicines as directed by your health care provider.  Do not take laxatives unless directed to do so by your health care provider.  Try a clear liquid diet (broth, tea, or water) as directed by your health care provider. Slowly move to a bland diet as tolerated. SEEK MEDICAL CARE IF:  You have unexplained abdominal pain.  You have abdominal pain associated with nausea or diarrhea.  You have pain when you urinate or have a bowel movement.  You experience abdominal pain that wakes you in the night.  You have abdominal pain that is worsened or improved by eating food.  You have abdominal pain that is worsened with eating fatty foods.  You have a fever. SEEK IMMEDIATE MEDICAL CARE IF:  Your pain does not go away within 2 hours.  You keep throwing up (vomiting).  Your pain is felt only in portions of the abdomen, such as the right side or the left lower portion of the abdomen.  You pass bloody or black tarry stools. MAKE SURE YOU:  Understand these instructions.  Will watch your condition.  Will get help right away if you are not doing well or get worse.   This  information is not intended to replace advice given to you by your health care provider. Make sure you discuss any questions you have with your health care provider.   Document Released: 04/28/2005 Document Revised: 04/09/2015 Document Reviewed: 03/28/2013 Elsevier Interactive Patient Education [employer].

## 2015-09-11 NOTE — H&P (Deleted)
[name] [name] is an 50 y.o. female.   Chief Complaint:  Right flank pain with cholelithiasis PCP:  Marton Redwood, MD  CARDIOLOGY:  DR. Quay Burow  HPI: Pt presents to the ED with right flank pain that started around 10-11 PM last night.  She says it's like sleeping on potatoes or rocks.  She also reported some sharp pain.  At first she thought it was a muscle strain then the symptoms more were  like pain with prior kidney stones.  She describes more pain in her groin than with prior kidney stones.  Pain persisted and she came to the ED.  Work up in the ED shows she is afebrile, VSS.  CMP is normal, CBC is normal, U/A is also normal.   Ultrasound show Multiple gallstones, no significant GB wall thickening.  Some minimal pericholecystic fluid. Negative Murphy's sign, CBD 3.5 mm, normal liver.     She has another [place] from 04/03/15 that showed multiple gallstones, GB wall thickness 2.8 cm, no fluid described.  CBD 4.8 mm at that time. Right kidney with calculi and scaring of cortex, left mid calculi noted.   We got a CT scan that showed: no calculi or hydronephrosis, normal liver, no calcified gallstones, borderline distension; the edema seen on the [place] was not as apparent on CT as it was on [place].   Discussed with Dr. Lucia Gaskins and he recommended HIDA scan.  Unfortunately she just had some pain med and cannot have the HIDA done today.     Past Medical History  Diagnosis Date   ? asthma    ?migraine       . PONV (postoperative nausea and vomiting)   . Chest pain   . GERD (gastroesophageal reflux disease)   . Headache(784.0)  Migraines     "often; not daily" (07/12/2012)  . Migraine   . Arthritis     "hands" (07/12/2012)  . Fibromyalgia   . Anxiety   . Chest pain at rest, on going 07/12/2012  . Family history of early CAD 07/12/2012    Past Surgical History  Procedure Laterality Date  . Cardiac catheterization  07/11/2012  . Tonsillectomy  ~ 1976  . Vaginal hysterectomy  ~ 2009  . Left  heart catheterization with coronary angiogram N/A 07/11/2012    Procedure: LEFT HEART CATHETERIZATION WITH CORONARY ANGIOGRAM;  Surgeon: Lorretta Harp, MD;  Location: [facility] CATH LAB;  Service: Cardiovascular;  Laterality: N/A;    History reviewed. No pertinent family history. Social History:  reports that she has never smoked. She has never used smokeless tobacco. She reports that she drinks alcohol. She reports that she does not use illicit drugs.   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     Allergies:  Allergies  Allergen Reactions  . Erythromycin Other (See Comments)    ABD pain  . Zithromax [Azithromycin] Other (See Comments)    ABD PAIN  . Morphine And Related Nausea And Vomiting  . Tramadol Nausea Only    Prior to Admission medications   Medication Sig Start Date End Date Taking? Authorizing Provider  BIOTIN PO Take 1 tablet by mouth daily.   Yes Historical Provider, MD  budesonide-formoterol (SYMBICORT) 80-4.5 MCG/ACT inhaler Inhale 2 puffs into the lungs daily.   Yes Historical Provider, MD  calcium-vitamin D (OSCAL WITH D) 500-200 MG-UNIT tablet Take 1 tablet by mouth daily with breakfast.   Yes Historical Provider, MD  cyclobenzaprine (FLEXERIL) 10 MG tablet Take 10 mg by mouth at bedtime.   Yes Historical Provider, MD  estradiol (ESTRACE) 1 MG tablet Take 1 mg by mouth daily. 09/03/15  Yes Historical Provider, MD  levocetirizine (XYZAL) 5 MG tablet Take 5 mg by mouth every evening.   Yes Historical Provider, MD  montelukast (SINGULAIR) 10 MG tablet Take 10 mg by mouth at bedtime.   Yes Historical Provider, MD  Multiple Vitamin (MULTIVITAMIN WITH MINERALS) TABS tablet Take 1 tablet by mouth daily.   Yes Historical Provider, MD  Omega-3 Fatty Acids (FISH OIL PO) Take 1 capsule by mouth daily.   Yes Historical Provider, MD  PRESCRIPTION MEDICATION 3 (three) times a week. *Allergy Shots*   Yes Historical  Provider, MD  RABEprazole (ACIPHEX) 20 MG tablet Take 20 mg by mouth daily.   Yes Historical Provider, MD  rizatriptan (MAXALT-MLT) 10 MG disintegrating tablet Take 1 tablet by mouth every 2 (two) hours as needed for migraine.  06/18/15  Yes Historical Provider, MD  VITAMIN E PO Take 1 tablet by mouth daily.   Yes Historical Provider, MD     Results for orders placed or performed during the hospital encounter of 09/11/15 (from the past 48 hour(s))  Urinalysis, Routine w reflex microscopic (not at [facility])     Status: None   Collection Time: 09/11/15  8:27 AM  Result Value Ref Range   Color, Urine YELLOW YELLOW   APPearance CLEAR CLEAR   Specific Gravity, Urine 1.022 1.005 - 1.030   pH 6.0 5.0 - 8.0   Glucose, UA NEGATIVE NEGATIVE mg/dL   Hgb urine dipstick NEGATIVE NEGATIVE   Bilirubin Urine NEGATIVE NEGATIVE   Ketones, ur NEGATIVE NEGATIVE mg/dL   Protein, ur NEGATIVE NEGATIVE mg/dL   Nitrite NEGATIVE NEGATIVE   Leukocytes, UA NEGATIVE NEGATIVE    Comment: MICROSCOPIC NOT DONE ON URINES WITH NEGATIVE PROTEIN, BLOOD, LEUKOCYTES, NITRITE, OR GLUCOSE <1000 mg/dL.  CBC with Differential     Status: None   Collection Time: 09/11/15  9:11 AM  Result Value Ref Range   WBC 6.7 4.0 - 10.5 K/uL   RBC 4.55 3.87 - 5.11 MIL/uL   Hemoglobin 13.9 12.0 - 15.0 g/dL   HCT 43.1 36.0 - 46.0 %   MCV 94.7 78.0 - 100.0 fL   MCH 30.5 26.0 - 34.0 pg   MCHC 32.3 30.0 - 36.0 g/dL   RDW 12.9 11.5 - 15.5 %   Platelets 275 150 - 400 K/uL   Neutrophils Relative % 56 %   Neutro Abs 3.7 1.7 - 7.7 K/uL   Lymphocytes Relative 35 %   Lymphs Abs 2.4 0.7 - 4.0 K/uL   Monocytes Relative 7 %   Monocytes Absolute 0.5 0.1 - 1.0 K/uL   Eosinophils Relative 2 %   Eosinophils Absolute 0.1 0.0 - 0.7 K/uL   Basophils Relative 0 %   Basophils Absolute 0.0 0.0 - 0.1 K/uL  Comprehensive metabolic panel     Status: Abnormal   Collection Time: 09/11/15  9:12 AM  Result Value Ref Range   Sodium 143 135 - 145 mmol/L    Potassium 3.9 3.5 - 5.1 mmol/L   Chloride 107 101 - 111 mmol/L   CO2 25 22 - 32 mmol/L   Glucose, Bld 112 (H) 65 - 99 mg/dL   BUN 16 6 - 20 mg/dL   Creatinine, Ser 0.94 0.44 - 1.00 mg/dL   Calcium 8.9 8.9 - 10.3 mg/dL   Total Protein 6.6 6.5 -  8.1 g/dL   Albumin 3.9 3.5 - 5.0 g/dL   AST 20 15 - 41 U/L   ALT 14 14 - 54 U/L   Alkaline Phosphatase 70 38 - 126 U/L   Total Bilirubin 0.5 0.3 - 1.2 mg/dL   GFR calc non Af Amer >60 >60 mL/min   GFR calc Af Amer >60 >60 mL/min    Comment: (NOTE) The eGFR has been calculated using the CKD EPI equation. This calculation has not been validated in all clinical situations. eGFR's persistently <60 mL/min signify possible Chronic Kidney Disease.    Anion gap 11 5 - 15  Lipase, blood     Status: None   Collection Time: 09/11/15  9:12 AM  Result Value Ref Range   Lipase 42 11 - 51 U/L   US Abdomen Complete  09/11/2015  CLINICAL DATA:  Right flank pain for 1 day EXAM: ABDOMEN ULTRASOUND COMPLETE COMPARISON:  None. FINDINGS: Gallbladder: Multiple gallstones are identified. No significant wall thickening is seen. Some minimal pericholecystic fluid is noted. Negative sonographic Percell Miller sign is noted although the patient has been medicated. Common bile duct: Diameter: 3.5 mm. Liver: No focal lesion identified. Within normal limits in parenchymal echogenicity. IVC: No abnormality visualized. Pancreas: Not well visualized due to overlying bowel gas. Spleen: Size and appearance within normal limits. Right Kidney: Length: 10.9 cm. Echogenicity within normal limits. No mass or hydronephrosis visualized. Left Kidney: Length: 10.6 cm. Echogenicity within normal limits. No mass or hydronephrosis visualized. Abdominal aorta: No aneurysm visualized. Other findings: None. IMPRESSION: Multiple gallstones with evidence of pericholecystic fluid. In the appropriate clinical setting this could represent acute cholecystitis. No other focal abnormality is noted. Electronically  Signed   By: Inez Catalina M.D.   On: 09/11/2015 12:15    Review of Systems  Constitutional: Negative.   HENT: Negative for congestion, ear discharge, ear pain, hearing loss, nosebleeds, sore throat and tinnitus.   Eyes: Negative.   Respiratory: Positive for cough (dry cought associated with allergies). Negative for hemoptysis, sputum production, shortness of breath, wheezing and stridor.   Cardiovascular: Positive for chest pain (occasional chest pain associated with stress, prior cath 2013, Dr. Lorie Phenix was normal). Negative for palpitations, orthopnea, claudication, leg swelling and PND.  Gastrointestinal: Positive for nausea (just once so far with this episode, she reports having episodes of morning sickness, not associated with PO intake .  on and off for a few months.) and abdominal pain (pain is really back right flank.  she has some chronic pain RLQ since her hysterectomy.  Not related). Negative for heartburn, vomiting, diarrhea, constipation, blood in stool and melena.  Genitourinary: Negative.   Musculoskeletal:       Fibromyalgia affects upper body hands and arms.  Skin: Negative.   Neurological: Negative.  Headaches: occasional migraines.  Endo/Heme/Allergies: Negative.   Psychiatric/Behavioral: Negative.     Blood pressure 122/81, pulse 74, temperature 97.8 F (36.6 C), temperature source Oral, resp. rate 16, height _0  (1.651 m), SpO2 95 %. Physical Exam  Constitutional: She is oriented to person, place, and time. She appears well-developed and well-nourished. No distress.  HENT:  Head: Normocephalic and atraumatic.  Nose: Nose normal.  Eyes: Conjunctivae and EOM are normal. Right eye exhibits no discharge. Left eye exhibits no discharge.  Neck: Normal range of motion. Neck supple. No JVD present. No tracheal deviation present. No thyromegaly present.  Cardiovascular: Normal rate, regular rhythm, normal heart sounds and intact distal pulses.   No murmur  heard. Respiratory:  Effort normal and breath sounds normal. No respiratory distress. She has no wheezes. She has no rales. She exhibits no tenderness.  GI: Soft. Bowel sounds are normal. She exhibits no distension and no mass. There is tenderness. There is no rebound and no guarding.  Her primary pain is in her back right lateral flank.  She has some chronic tenderness over the RLQ that has been present since her hysterectomy.  She is somewhat tender to deep palpation over the RUQ.  Musculoskeletal: She exhibits no edema or tenderness.  Lymphadenopathy:    She has no cervical adenopathy.  Neurological: She is alert and oriented to person, place, and time. No cranial nerve deficit.  Skin: Skin is warm and dry. No rash noted. She is not diaphoretic. No erythema. No pallor.  Psychiatric: She has a normal mood and affect. Her behavior is normal. Judgment and thought content normal.     Assessment/Plan Right flank pain and history of nephrolithiasis Cholelithiasis, with some pericholecystic fluid, normal CBC, normal LFT's and lipase Hx of chest pain with normal cardiac cath 2013 Hx of migraines Hx of arthritis in her hands and Fibromyalgia, upper body, hands and arms GERD Significant narcotic intolerance Allergy induced asthmatic symptoms  Plan:  Pt felt better with some pain medicine, and Zofran that she took before coming to the ED.  She  did not want to stay in the hospital for further work up.  We have arranged for her to get a HIDA scan tomorrow, as an outpatient.  She is scheduled to see Dr. Ninfa Linden as a new patient on Monday 09/15/15.  Appointment is at 9:15 AM and she is to be at the office at 8:45 AM for check in.  If she has more symptoms and does not do well in the interim, she is to come back to the ED for further evaluation and work up.  She knows to not eat or take any pain medicine for 6 hours prior to her planned HIDA scan tomorrow.     Daren Doswell, PA-C 09/11/2015, 12:57  PM

## 2015-09-11 NOTE — ED Provider Notes (Signed)
CSN: [id number]     Arrival date & time 09/11/15  0827 History   First MD Initiated Contact with Patient 09/11/15 [phone]     Chief Complaint  Patient presents with  . Flank Pain     (Consider location/radiation/quality/duration/timing/severity/associated sxs/prior Treatment) HPI   50 year old female with right back pain radiating into her right flank. Symptom onset last night around 11pm. Initially felt like she was "sleeping on a sack of potatoes." Increasing pain. Could not find a comfortable position. Associated with nausea. She is a past history of kidney stones. Current symptoms feel somewhat reminiscent of that although pain was more into her groin with prior stones. No fevers or chills. No urinary complaints. Surgical history significant for hysterectomy. Has previously required lithotripsy.   Past Medical History  Diagnosis Date  . PONV (postoperative nausea and vomiting)   . Hypertension   . Chest pain   . GERD (gastroesophageal reflux disease)   . Headache(784.0)     "often; not daily" (07/12/2012)  . Migraine   . Arthritis     "hands" (07/12/2012)  . Fibromyalgia   . Anxiety   . Chest pain at rest, on going 07/12/2012  . HTN (hypertension) 07/12/2012  . Family history of early CAD 07/12/2012   Past Surgical History  Procedure Laterality Date  . Cardiac catheterization  07/11/2012  . Tonsillectomy  ~ 1976  . Vaginal hysterectomy  ~ 2009  . Left heart catheterization with coronary angiogram N/A 07/11/2012    Procedure: LEFT HEART CATHETERIZATION WITH CORONARY ANGIOGRAM;  Surgeon: Lorretta Harp, MD;  Location: [facility] CATH LAB;  Service: Cardiovascular;  Laterality: N/A;   No family history on file. Social History  Substance Use Topics  . Smoking status: Never Smoker   . Smokeless tobacco: Never Used  . Alcohol Use: Yes     Comment: 07/12/2012 "glass of wine 3X/yr or so"   OB History    No data available     Review of Systems  All systems reviewed and negative,  other than as noted in HPI.   Allergies  Erythromycin and Zithromax  Home Medications   Prior to Admission medications   Medication Sig Start Date End Date Taking? Authorizing Provider  cyclobenzaprine (FLEXERIL) 10 MG tablet Take 10 mg by mouth at bedtime.    Historical Provider, MD  furosemide (LASIX) 40 MG tablet Take 40 mg by mouth daily as needed. For edema    Historical Provider, MD  olmesartan (BENICAR) 20 MG tablet Take 20 mg by mouth daily.    Historical Provider, MD  RABEprazole (ACIPHEX) 20 MG tablet Take 20 mg by mouth daily.    Historical Provider, MD   There were no vitals taken for this visit. Physical Exam  Constitutional: She appears well-developed and well-nourished. No distress.  HENT:  Head: Normocephalic and atraumatic.  Eyes: Conjunctivae are normal. Right eye exhibits no discharge. Left eye exhibits no discharge.  Neck: Neck supple.  Cardiovascular: Normal rate, regular rhythm and normal heart sounds.  Exam reveals no gallop and no friction rub.   No murmur heard. Pulmonary/Chest: Effort normal and breath sounds normal. No respiratory distress.  Abdominal: Soft. She exhibits no distension. There is no tenderness.  Tender in R flank w/o rebound or guarding  Musculoskeletal: She exhibits no edema or tenderness.  Neurological: She is alert.  Skin: Skin is warm and dry.  Psychiatric: She has a normal mood and affect. Her behavior is normal. Thought content normal.  Nursing note and vitals reviewed.  ED Course  Procedures (including critical care time) Labs Review Labs Reviewed  COMPREHENSIVE METABOLIC PANEL - Abnormal; Notable for the following:    Glucose, Bld 112 (*)    All other components within normal limits  CBC WITH DIFFERENTIAL/PLATELET  URINALYSIS, ROUTINE W REFLEX MICROSCOPIC (NOT AT [facility])  LIPASE, BLOOD    Imaging Review Ct Abdomen Pelvis Wo Contrast  09/11/2015  CLINICAL DATA:  Right-sided flank pain.  History kidney stone. EXAM: CT  ABDOMEN AND PELVIS WITHOUT CONTRAST TECHNIQUE: Multidetector CT imaging of the abdomen and pelvis was performed following the standard protocol without IV contrast. COMPARISON:  09/11/2015 ultrasound.  CT of 06/20/2010. FINDINGS: Lower chest: Mild bibasilar atelectasis. Borderline cardiomegaly, without pericardial or pleural effusion. Hepatobiliary: Normal liver. No calcified gallstone. Borderline gallbladder distension. Pericholecystic edema described on today's ultrasound is not readily apparent. Pancreas: Normal, without mass or ductal dilatation. Spleen: Normal in size, without focal abnormality. Adrenals/Urinary Tract: Normal adrenal glands. No renal calculi or hydronephrosis. No hydroureter or ureteric calculi. No bladder calculi. Stomach/Bowel: Normal stomach, without wall thickening. Normal colon and terminal ileum. Normal appendix on coronal image 47. Normal small bowel. Vascular/Lymphatic: Normal caliber of the aorta and branch vessels. No abdominopelvic adenopathy. Reproductive: Hysterectomy.  No adnexal mass. Other: No significant free fluid. Musculoskeletal: No acute osseous abnormality. Mild disc bulges including at L3-4 and L4-5. IMPRESSION: 1.  No urinary tract calculi or hydronephrosis. 2. Borderline gallbladder distension. The suspicious findings for acute cholecystitis are less apparent on the current exam than on today's ultrasound. Please see that report. 3. No other explanation for right-sided pain. Electronically Signed   By: Abigail Miyamoto M.D.   On: 09/11/2015 13:46   US Abdomen Complete  09/11/2015  CLINICAL DATA:  Right flank pain for 1 day EXAM: ABDOMEN ULTRASOUND COMPLETE COMPARISON:  None. FINDINGS: Gallbladder: Multiple gallstones are identified. No significant wall thickening is seen. Some minimal pericholecystic fluid is noted. Negative sonographic Percell Miller sign is noted although the patient has been medicated. Common bile duct: Diameter: 3.5 mm. Liver: No focal lesion identified.  Within normal limits in parenchymal echogenicity. IVC: No abnormality visualized. Pancreas: Not well visualized due to overlying bowel gas. Spleen: Size and appearance within normal limits. Right Kidney: Length: 10.9 cm. Echogenicity within normal limits. No mass or hydronephrosis visualized. Left Kidney: Length: 10.6 cm. Echogenicity within normal limits. No mass or hydronephrosis visualized. Abdominal aorta: No aneurysm visualized. Other findings: None. IMPRESSION: Multiple gallstones with evidence of pericholecystic fluid. In the appropriate clinical setting this could represent acute cholecystitis. No other focal abnormality is noted. Electronically Signed   By: Inez Catalina M.D.   On: 09/11/2015 12:15   I have personally reviewed and evaluated these images and lab results as part of my medical decision-making.   EKG Interpretation None      MDM   Final diagnoses:  Right flank pain    50 year old female with right back/right flank pain. Symptoms seem most consistent with ureteral colic. She does have a past history kidney stones as well. No blood on UA though which is possible, but unexpected with a ureteral stone. Prior imaging reviewed. She had a ultrasound this past September which did note multiple gallstones. Repeat US today again shows stones and minimal pericholecystic fluid. No GB thickening. No leukocytosis. LFTs are normal. Is actually more tender into flank than than in RUQ. Source of her pain is not completely clear. Will discuss with surgery.   CT fairly unremarkable. Will have return tomorrow for HIDA. Appreciate surgical consultation.  To see Dr Ninfa Linden in office on Monday.     Virgel Manifold, MD 09/16/15 1256

## 2015-09-11 NOTE — ED Notes (Signed)
Pt unable to void.  IVF running.

## 2015-09-11 NOTE — ED Notes (Signed)
MD at bedside. 

## 2015-09-11 NOTE — ED Notes (Signed)
Per pt,  Pain in rt flank starting at 11pm last night.  Hx of kidney stone.  No change in urination.   Pain comes under right rib cage.  Nausea with no vomiting.  Zofran at home.

## 2015-09-12 ENCOUNTER — Ambulatory Visit (HOSPITAL_COMMUNITY)
Admission: RE | Admit: 2015-09-12 | Discharge: 2015-09-12 | Disposition: A | Discharge status: Home / Self Care | Payer: PRIVATE HEALTH INSURANCE | Source: Ambulatory Visit | Attending: Emergency Medicine | Admitting: Emergency Medicine

## 2015-09-12 ENCOUNTER — Other Ambulatory Visit (HOSPITAL_COMMUNITY): Payer: Self-pay | Admitting: Emergency Medicine

## 2015-09-12 DIAGNOSIS — Z885 Allergy status to narcotic agent status: Secondary | ICD-10-CM | POA: Diagnosis not present

## 2015-09-12 DIAGNOSIS — R1011 Right upper quadrant pain: Secondary | ICD-10-CM | POA: Diagnosis not present

## 2015-09-12 MED ORDER — TECHNETIUM TC 99M MEBROFENIN IV KIT
5.4000 | PACK | Freq: Once | INTRAVENOUS | Status: AC | PRN
  Administered 2015-09-12: 5.4 via INTRAVENOUS

## 2015-09-15 ENCOUNTER — Other Ambulatory Visit: Payer: Self-pay | Admitting: Surgery

## 2015-09-17 ENCOUNTER — Encounter (HOSPITAL_COMMUNITY): Payer: Self-pay | Admitting: *Deleted

## 2015-09-17 MED ORDER — CEFAZOLIN SODIUM-DEXTROSE 2-3 GM-% IV SOLR
2.0000 g | INTRAVENOUS | Status: AC
  Administered 2015-09-18: 2 g via INTRAVENOUS
  Filled 2015-09-17: qty 50

## 2015-09-17 NOTE — Progress Notes (Addendum)
Pt is VERY concerned about what pain medications and anti-nausea medications that she will be given during surgery and after. I explained to her that the anesthesiologist would be talking with her prior to surgery and will explain to her what they will be giving her and that she will have a chance to explain to them her concerns. She states she gets very sick after anesthesia, especially with Morphine, Dilaudid, ?Demerol. She thinks Fentanyl works well for her. She asked if she could take Zofran prior to arrival and I told her that was allowed.  Pt denies cardiac history. She has had a cath, Echo and stress test done in 2012 and 2013 for chest pain, all were normal. She denies any recent chest pain or sob.  Pt states she's had an EKG done at Dr. Raul Del office [facility]) in the past year. Requested copy to be faxed to [place], spoke with Olivia Mackie at Dr. Raul Del office

## 2015-09-17 NOTE — H&P (Signed)
Expand All Collapse All   [name] [name] is an 50 y.o. female.  Chief Complaint: Right flank pain with cholelithiasis PCP: Marton Redwood, MD  CARDIOLOGY: DR. Quay Burow  HPI: Pt presents to the ED with right flank pain. She says it's like sleeping on potatoes or rocks. She also reported some sharp pain. At first she thought it was a muscle strain then the symptoms more were like pain with prior kidney stones. She describes more pain in her groin than with prior kidney stones. Pain persisted and she came to the ED.   Work up in the ED shows she is afebrile, VSS. CMP is normal, CBC is normal, U/A is also normal.  Ultrasound show Multiple gallstones, no significant GB wall thickening. Some minimal pericholecystic fluid. Negative Murphy's sign, CBD 3.5 mm, normal liver.  She has another [place] from 04/03/15 that showed multiple gallstones, GB wall thickness 2.8 cm, no fluid described. CBD 4.8 mm at that time. Right kidney with calculi and scaring of cortex, left mid calculi noted.  We got a CT scan that showed: no calculi or hydronephrosis, normal liver, no calcified gallstones, borderline distension; the edema seen on the [place] was not as apparent on CT as it was on [place].  She has since had a HIDA scan showing non visualization of the gallbladder.   Past Medical History  Diagnosis Date   ? asthma    ?migraine       . PONV (postoperative nausea and vomiting)   . Chest pain   . GERD (gastroesophageal reflux disease)   . Headache(784.0) Migraines     "often; not daily" (07/12/2012)  . Migraine   . Arthritis     "hands" (07/12/2012)  . Fibromyalgia   . Anxiety   . Chest pain at rest, on going 07/12/2012  . Family history of early CAD 07/12/2012    Past Surgical History  Procedure Laterality Date  . Cardiac catheterization  07/11/2012  . Tonsillectomy  ~ 1976  . Vaginal hysterectomy  ~ 2009  . Left  heart catheterization with coronary angiogram N/A 07/11/2012    Procedure: LEFT HEART CATHETERIZATION WITH CORONARY ANGIOGRAM; Surgeon: Lorretta Harp, MD; Location: [facility] CATH LAB; Service: Cardiovascular; Laterality: N/A;    History reviewed. No pertinent family history. Social History:  reports that she has never smoked. She has never used smokeless tobacco. She reports that she drinks alcohol. She reports that she does not use illicit drugs.   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    Allergies:  Allergies  Allergen Reactions  . Erythromycin Other (See Comments)    ABD pain  . Zithromax [Azithromycin] Other (See Comments)    ABD PAIN  . Morphine And Related Nausea And Vomiting  . Tramadol Nausea Only    Prior to Admission medications   Medication Sig Start Date End Date Taking? Authorizing Provider  BIOTIN PO Take 1 tablet by mouth daily.   Yes Historical Provider, MD  budesonide-formoterol (SYMBICORT) 80-4.5 MCG/ACT inhaler Inhale 2 puffs into the lungs daily.   Yes Historical Provider, MD  calcium-vitamin D (OSCAL WITH D) 500-200 MG-UNIT tablet Take 1 tablet by mouth daily with breakfast.   Yes Historical Provider, MD  cyclobenzaprine (FLEXERIL) 10 MG tablet Take 10 mg by mouth at bedtime.   Yes Historical Provider, MD  estradiol (ESTRACE) 1 MG tablet Take 1 mg by mouth daily. 09/03/15  Yes Historical Provider, MD  levocetirizine (XYZAL) 5  MG tablet Take 5 mg by mouth every evening.   Yes Historical Provider, MD  montelukast (SINGULAIR) 10 MG tablet Take 10 mg by mouth at bedtime.   Yes Historical Provider, MD  Multiple Vitamin (MULTIVITAMIN WITH MINERALS) TABS tablet Take 1 tablet by mouth daily.   Yes Historical Provider, MD  Omega-3 Fatty Acids (FISH OIL PO) Take 1 capsule by mouth daily.   Yes Historical Provider, MD   PRESCRIPTION MEDICATION 3 (three) times a week. *Allergy Shots*   Yes Historical Provider, MD  RABEprazole (ACIPHEX) 20 MG tablet Take 20 mg by mouth daily.   Yes Historical Provider, MD  rizatriptan (MAXALT-MLT) 10 MG disintegrating tablet Take 1 tablet by mouth every 2 (two) hours as needed for migraine.  06/18/15  Yes Historical Provider, MD  VITAMIN E PO Take 1 tablet by mouth daily.   Yes Historical Provider, MD      Lab Results Last 48 Hours    Results for orders placed or performed during the hospital encounter of 09/11/15 (from the past 48 hour(s))  Urinalysis, Routine w reflex microscopic (not at [facility]) Status: None   Collection Time: 09/11/15 8:27 AM  Result Value Ref Range   Color, Urine YELLOW YELLOW   APPearance CLEAR CLEAR   Specific Gravity, Urine 1.022 1.005 - 1.030   pH 6.0 5.0 - 8.0   Glucose, UA NEGATIVE NEGATIVE mg/dL   Hgb urine dipstick NEGATIVE NEGATIVE   Bilirubin Urine NEGATIVE NEGATIVE   Ketones, ur NEGATIVE NEGATIVE mg/dL   Protein, ur NEGATIVE NEGATIVE mg/dL   Nitrite NEGATIVE NEGATIVE   Leukocytes, UA NEGATIVE NEGATIVE    Comment: MICROSCOPIC NOT DONE ON URINES WITH NEGATIVE PROTEIN, BLOOD, LEUKOCYTES, NITRITE, OR GLUCOSE <1000 mg/dL.  CBC with Differential Status: None   Collection Time: 09/11/15 9:11 AM  Result Value Ref Range   WBC 6.7 4.0 - 10.5 K/uL   RBC 4.55 3.87 - 5.11 MIL/uL   Hemoglobin 13.9 12.0 - 15.0 g/dL   HCT 43.1 36.0 - 46.0 %   MCV 94.7 78.0 - 100.0 fL   MCH 30.5 26.0 - 34.0 pg   MCHC 32.3 30.0 - 36.0 g/dL   RDW 12.9 11.5 - 15.5 %   Platelets 275 150 - 400 K/uL   Neutrophils Relative % 56 %   Neutro Abs 3.7 1.7 - 7.7 K/uL   Lymphocytes Relative 35 %   Lymphs Abs 2.4 0.7 - 4.0 K/uL   Monocytes Relative 7 %   Monocytes Absolute 0.5 0.1 - 1.0 K/uL   Eosinophils Relative 2 %    Eosinophils Absolute 0.1 0.0 - 0.7 K/uL   Basophils Relative 0 %   Basophils Absolute 0.0 0.0 - 0.1 K/uL  Comprehensive metabolic panel Status: Abnormal   Collection Time: 09/11/15 9:12 AM  Result Value Ref Range   Sodium 143 135 - 145 mmol/L   Potassium 3.9 3.5 - 5.1 mmol/L   Chloride 107 101 - 111 mmol/L   CO2 25 22 - 32 mmol/L   Glucose, Bld 112 (H) 65 - 99 mg/dL   BUN 16 6 - 20 mg/dL   Creatinine, Ser 0.94 0.44 - 1.00 mg/dL   Calcium 8.9 8.9 - 10.3 mg/dL   Total Protein 6.6 6.5 - 8.1 g/dL   Albumin 3.9 3.5 - 5.0 g/dL   AST 20 15 - 41 U/L   ALT 14 14 - 54 U/L   Alkaline Phosphatase 70 38 - 126 U/L   Total Bilirubin 0.5 0.3 - 1.2 mg/dL  GFR calc non Af Amer >60 >60 mL/min   GFR calc Af Amer >60 >60 mL/min    Comment: (NOTE) The eGFR has been calculated using the CKD EPI equation. This calculation has not been validated in all clinical situations. eGFR's persistently <60 mL/min signify possible Chronic Kidney Disease.    Anion gap 11 5 - 15  Lipase, blood Status: None   Collection Time: 09/11/15 9:12 AM  Result Value Ref Range   Lipase 42 11 - 51 U/L      Imaging Results (Last 48 hours)    US Abdomen Complete  09/11/2015 CLINICAL DATA: Right flank pain for 1 day EXAM: ABDOMEN ULTRASOUND COMPLETE COMPARISON: None. FINDINGS: Gallbladder: Multiple gallstones are identified. No significant wall thickening is seen. Some minimal pericholecystic fluid is noted. Negative sonographic Percell Miller sign is noted although the patient has been medicated. Common bile duct: Diameter: 3.5 mm. Liver: No focal lesion identified. Within normal limits in parenchymal echogenicity. IVC: No abnormality visualized. Pancreas: Not well visualized due to overlying bowel gas. Spleen: Size and appearance within normal limits. Right Kidney: Length: 10.9 cm. Echogenicity within normal limits. No mass  or hydronephrosis visualized. Left Kidney: Length: 10.6 cm. Echogenicity within normal limits. No mass or hydronephrosis visualized. Abdominal aorta: No aneurysm visualized. Other findings: None. IMPRESSION: Multiple gallstones with evidence of pericholecystic fluid. In the appropriate clinical setting this could represent acute cholecystitis. No other focal abnormality is noted. Electronically Signed By: Inez Catalina M.D. On: 09/11/2015 12:15     Review of Systems  Constitutional: Negative.  HENT: Negative for congestion, ear discharge, ear pain, hearing loss, nosebleeds, sore throat and tinnitus.  Eyes: Negative.  Respiratory: Positive for cough (dry cought associated with allergies). Negative for hemoptysis, sputum production, shortness of breath, wheezing and stridor.  Cardiovascular: Positive for chest pain (occasional chest pain associated with stress, prior cath 2013, Dr. Lorie Phenix was normal). Negative for palpitations, orthopnea, claudication, leg swelling and PND.  Gastrointestinal: Positive for nausea (just once so far with this episode, she reports having episodes of morning sickness, not associated with PO intake . on and off for a few months.) and abdominal pain (pain is really back right flank. she has some chronic pain RLQ since her hysterectomy. Not related). Negative for heartburn, vomiting, diarrhea, constipation, blood in stool and melena.  Genitourinary: Negative.  Musculoskeletal:   Fibromyalgia affects upper body hands and arms.  Skin: Negative.  Neurological: Negative. Headaches: occasional migraines.  Endo/Heme/Allergies: Negative.  Psychiatric/Behavioral: Negative.    Blood pressure 122/81, pulse 74, temperature 97.8 F (36.6 C), temperature source Oral, resp. rate 16, height '5\' 5"'  (1.651 m), SpO2 95 %. Physical Exam  Constitutional: She is oriented to person, place, and time. She appears well-developed and well-nourished. No distress.   HENT:  Head: Normocephalic and atraumatic.  Nose: Nose normal.  Eyes: Conjunctivae and EOM are normal. Right eye exhibits no discharge. Left eye exhibits no discharge.  Neck: Normal range of motion. Neck supple. No JVD present. No tracheal deviation present. No thyromegaly present.  Cardiovascular: Normal rate, regular rhythm, normal heart sounds and intact distal pulses.  No murmur heard. Respiratory: Effort normal and breath sounds normal. No respiratory distress. She has no wheezes. She has no rales. She exhibits no tenderness.  GI: Soft. Bowel sounds are normal. She exhibits no distension and no mass. There is tenderness. There is no rebound and no guarding.  Her primary pain is in her back right lateral flank. She has some chronic tenderness over the RLQ  that has been present since her hysterectomy. She is somewhat tender to deep palpation over the RUQ.  Musculoskeletal: She exhibits no edema or tenderness.  Lymphadenopathy:   She has no cervical adenopathy.  Neurological: She is alert and oriented to person, place, and time. No cranial nerve deficit.  Skin: Skin is warm and dry. No rash noted. She is not diaphoretic. No erythema. No pallor.  Psychiatric: She has a normal mood and affect. Her behavior is normal. Judgment and thought content normal.     Assessment/Plan Symptomatic cholelithiasis with probable cholecystitis  Plan laparoscopic cholecystectomy I discussed the procedure in detail.  The patient was given Neurosurgeon.  We discussed the risks and benefits of a laparoscopic cholecystectomy and possible cholangiogram including, but not limited to bleeding, infection, injury to surrounding structures such as the intestine or liver, bile leak, retained gallstones, need to convert to an open procedure, prolonged diarrhea, blood clots such as  DVT, common bile duct injury, anesthesia risks, and possible need for additional procedures.  The likelihood of improvement in  symptoms and return to the patient's normal status is good. We discussed the typical post-operative recovery course.

## 2015-09-18 ENCOUNTER — Encounter (HOSPITAL_COMMUNITY): Payer: Self-pay | Admitting: *Deleted

## 2015-09-18 ENCOUNTER — Ambulatory Visit (HOSPITAL_COMMUNITY)
Admission: RE | Admit: 2015-09-18 | Discharge: 2015-09-18 | Disposition: A | Discharge status: Home / Self Care | Payer: PRIVATE HEALTH INSURANCE | Source: Ambulatory Visit | Attending: Surgery | Admitting: Surgery

## 2015-09-18 ENCOUNTER — Ambulatory Visit (HOSPITAL_COMMUNITY): Payer: PRIVATE HEALTH INSURANCE | Admitting: Anesthesiology

## 2015-09-18 ENCOUNTER — Encounter (HOSPITAL_COMMUNITY)
Admission: RE | Disposition: A | Discharge status: Home / Self Care | Payer: Self-pay | Source: Ambulatory Visit | Attending: Surgery

## 2015-09-18 DIAGNOSIS — Z79890 Hormone replacement therapy (postmenopausal): Secondary | ICD-10-CM | POA: Diagnosis not present

## 2015-09-18 DIAGNOSIS — Z79899 Other long term (current) drug therapy: Secondary | ICD-10-CM | POA: Diagnosis not present

## 2015-09-18 DIAGNOSIS — K8010 Calculus of gallbladder with chronic cholecystitis without obstruction: Secondary | ICD-10-CM | POA: Insufficient documentation

## 2015-09-18 DIAGNOSIS — I10 Essential (primary) hypertension: Secondary | ICD-10-CM | POA: Insufficient documentation

## 2015-09-18 DIAGNOSIS — K219 Gastro-esophageal reflux disease without esophagitis: Secondary | ICD-10-CM | POA: Diagnosis not present

## 2015-09-18 DIAGNOSIS — M797 Fibromyalgia: Secondary | ICD-10-CM | POA: Diagnosis not present

## 2015-09-18 DIAGNOSIS — Z7951 Long term (current) use of inhaled steroids: Secondary | ICD-10-CM | POA: Diagnosis not present

## 2015-09-18 HISTORY — PX: CHOLECYSTECTOMY: SHX55

## 2015-09-18 HISTORY — DX: Unspecified asthma, uncomplicated: J45.909

## 2015-09-18 HISTORY — DX: Pneumonia, unspecified organism: J18.9

## 2015-09-18 HISTORY — DX: Anemia, unspecified: D64.9

## 2015-09-18 HISTORY — DX: Calculus of kidney: N20.0

## 2015-09-18 SURGERY — LAPAROSCOPIC CHOLECYSTECTOMY
Anesthesia: General

## 2015-09-18 MED ORDER — LIDOCAINE HCL (CARDIAC) 20 MG/ML IV SOLN
INTRAVENOUS | Status: AC
Start: 2015-09-18 — End: 2015-09-18
  Filled 2015-09-18: qty 5

## 2015-09-18 MED ORDER — ROCURONIUM BROMIDE 100 MG/10ML IV SOLN
INTRAVENOUS | Status: DC | PRN
  Administered 2015-09-18: 40 mg via INTRAVENOUS

## 2015-09-18 MED ORDER — ACETAMINOPHEN 325 MG PO TABS: 650.0000 mg | ORAL_TABLET | ORAL | Status: DC | PRN

## 2015-09-18 MED ORDER — FENTANYL CITRATE (PF) 100 MCG/2ML IJ SOLN
INTRAMUSCULAR | Status: AC
  Filled 2015-09-18: qty 2

## 2015-09-18 MED ORDER — BUPIVACAINE-EPINEPHRINE 0.25% -1:200000 IJ SOLN
INTRAMUSCULAR | Status: DC | PRN
  Administered 2015-09-18: 20 mL

## 2015-09-18 MED ORDER — LACTATED RINGERS IV SOLN
INTRAVENOUS | Status: DC
  Administered 2015-09-18: 11:00:00 via INTRAVENOUS

## 2015-09-18 MED ORDER — MIDAZOLAM HCL 2 MG/2ML IJ SOLN
INTRAMUSCULAR | Status: AC
  Filled 2015-09-18: qty 2

## 2015-09-18 MED ORDER — SUGAMMADEX SODIUM 200 MG/2ML IV SOLN
INTRAVENOUS | Status: DC | PRN
  Administered 2015-09-18: 200 mg via INTRAVENOUS

## 2015-09-18 MED ORDER — PROMETHAZINE HCL 25 MG/ML IJ SOLN
INTRAMUSCULAR | Status: AC
  Filled 2015-09-18: qty 1

## 2015-09-18 MED ORDER — FAMOTIDINE IN NACL 20-0.9 MG/50ML-% IV SOLN
20.0000 mg | Freq: Two times a day (BID) | INTRAVENOUS | Status: DC
  Filled 2015-09-18 (×2): qty 50

## 2015-09-18 MED ORDER — FENTANYL CITRATE (PF) 100 MCG/2ML IJ SOLN
INTRAMUSCULAR | Status: DC | PRN
  Administered 2015-09-18: 100 ug via INTRAVENOUS

## 2015-09-18 MED ORDER — ROCURONIUM BROMIDE 50 MG/5ML IV SOLN
INTRAVENOUS | Status: AC
  Filled 2015-09-18: qty 1

## 2015-09-18 MED ORDER — DEXAMETHASONE SODIUM PHOSPHATE 4 MG/ML IJ SOLN
INTRAMUSCULAR | Status: AC
  Filled 2015-09-18: qty 2

## 2015-09-18 MED ORDER — DEXAMETHASONE SODIUM PHOSPHATE 4 MG/ML IJ SOLN
INTRAMUSCULAR | Status: DC | PRN
  Administered 2015-09-18: 8 mg via INTRAVENOUS

## 2015-09-18 MED ORDER — PROPOFOL 10 MG/ML IV BOLUS
INTRAVENOUS | Status: AC
  Filled 2015-09-18: qty 20

## 2015-09-18 MED ORDER — FENTANYL CITRATE (PF) 250 MCG/5ML IJ SOLN
INTRAMUSCULAR | Status: AC
  Filled 2015-09-18: qty 5

## 2015-09-18 MED ORDER — SODIUM CHLORIDE 0.9 % IV SOLN: 250.0000 mL | INTRAVENOUS | Status: DC | PRN

## 2015-09-18 MED ORDER — ACETAMINOPHEN 10 MG/ML IV SOLN
INTRAVENOUS | Status: AC
  Filled 2015-09-18: qty 100

## 2015-09-18 MED ORDER — SODIUM CHLORIDE 0.9% FLUSH: 3.0000 mL | INTRAVENOUS | Status: DC | PRN

## 2015-09-18 MED ORDER — ONDANSETRON HCL 4 MG/2ML IJ SOLN
INTRAMUSCULAR | Status: AC
  Filled 2015-09-18: qty 2

## 2015-09-18 MED ORDER — ACETAMINOPHEN 650 MG RE SUPP: 650.0000 mg | RECTAL | Status: DC | PRN

## 2015-09-18 MED ORDER — SODIUM CHLORIDE 0.9% FLUSH: 3.0000 mL | Freq: Two times a day (BID) | INTRAVENOUS | Status: DC

## 2015-09-18 MED ORDER — FENTANYL CITRATE (PF) 100 MCG/2ML IJ SOLN
25.0000 ug | INTRAMUSCULAR | Status: DC | PRN
  Administered 2015-09-18 (×4): 25 ug via INTRAVENOUS

## 2015-09-18 MED ORDER — PROMETHAZINE HCL 25 MG/ML IJ SOLN
6.2500 mg | Freq: Once | INTRAMUSCULAR | Status: AC
  Administered 2015-09-18: 6.25 mg via INTRAVENOUS

## 2015-09-18 MED ORDER — ONDANSETRON HCL 4 MG/2ML IJ SOLN
INTRAMUSCULAR | Status: DC | PRN
  Administered 2015-09-18: 4 mg via INTRAVENOUS

## 2015-09-18 MED ORDER — FENTANYL CITRATE (PF) 100 MCG/2ML IJ SOLN: 25.0000 ug | INTRAMUSCULAR | Status: DC | PRN

## 2015-09-18 MED ORDER — ACETAMINOPHEN 10 MG/ML IV SOLN
INTRAVENOUS | Status: DC | PRN
  Administered 2015-09-18: 1000 mg via INTRAVENOUS

## 2015-09-18 MED ORDER — SUGAMMADEX SODIUM 200 MG/2ML IV SOLN
INTRAVENOUS | Status: AC
  Filled 2015-09-18: qty 2

## 2015-09-18 MED ORDER — HYDROCODONE-ACETAMINOPHEN 5-325 MG PO TABS: 1.0000 | ORAL_TABLET | ORAL | Status: DC | PRN

## 2015-09-18 MED ORDER — MIDAZOLAM HCL 5 MG/5ML IJ SOLN
INTRAMUSCULAR | Status: DC | PRN
  Administered 2015-09-18: 2 mg via INTRAVENOUS

## 2015-09-18 MED ORDER — PROPOFOL 10 MG/ML IV BOLUS
INTRAVENOUS | Status: DC | PRN
  Administered 2015-09-18: 150 mg via INTRAVENOUS

## 2015-09-18 MED ORDER — LIDOCAINE HCL (CARDIAC) 20 MG/ML IV SOLN
INTRAVENOUS | Status: DC | PRN
  Administered 2015-09-18: 80 mg via INTRAVENOUS

## 2015-09-18 SURGICAL SUPPLY — 31 items
APPLIER CLIP 5 13 M/L LIGAMAX5 (MISCELLANEOUS) ×3
APR CLP MED LRG 5 ANG JAW (MISCELLANEOUS) ×1
BAG SPEC RTRVL LRG 6X4 10 (ENDOMECHANICALS) ×1
CANISTER SUCTION 2500CC (MISCELLANEOUS) ×3 IMPLANT
CHLORAPREP W/TINT 26ML (MISCELLANEOUS) ×3 IMPLANT
CLIP APPLIE 5 13 M/L LIGAMAX5 (MISCELLANEOUS) ×1 IMPLANT
COVER SURGICAL LIGHT HANDLE (MISCELLANEOUS) ×3 IMPLANT
ELECT REM PT RETURN 9FT ADLT (ELECTROSURGICAL) ×3
ELECTRODE REM PT RTRN 9FT ADLT (ELECTROSURGICAL) ×1 IMPLANT
GLOVE SURG SIGNA 7.5 PF LTX (GLOVE) ×3 IMPLANT
GOWN STRL REUS W/ TWL LRG LVL3 (GOWN DISPOSABLE) ×2 IMPLANT
GOWN STRL REUS W/ TWL XL LVL3 (GOWN DISPOSABLE) ×1 IMPLANT
GOWN STRL REUS W/TWL LRG LVL3 (GOWN DISPOSABLE) ×6
GOWN STRL REUS W/TWL XL LVL3 (GOWN DISPOSABLE) ×3
KIT BASIN OR (CUSTOM PROCEDURE TRAY) ×3 IMPLANT
KIT ROOM TURNOVER OR (KITS) ×3 IMPLANT
LIQUID BAND (GAUZE/BANDAGES/DRESSINGS) ×3 IMPLANT
NS IRRIG 1000ML POUR BTL (IV SOLUTION) ×3 IMPLANT
PAD ARMBOARD 7.5X6 YLW CONV (MISCELLANEOUS) ×3 IMPLANT
POUCH SPECIMEN RETRIEVAL 10MM (ENDOMECHANICALS) ×3 IMPLANT
SCISSORS LAP 5X35 DISP (ENDOMECHANICALS) ×3 IMPLANT
SET IRRIG TUBING LAPAROSCOPIC (IRRIGATION / IRRIGATOR) ×3 IMPLANT
SLEEVE ENDOPATH XCEL 5M (ENDOMECHANICALS) ×6 IMPLANT
SPECIMEN JAR SMALL (MISCELLANEOUS) ×3 IMPLANT
SUT MON AB 4-0 PC3 18 (SUTURE) ×3 IMPLANT
TOWEL OR 17X24 6PK STRL BLUE (TOWEL DISPOSABLE) ×3 IMPLANT
TOWEL OR 17X26 10 PK STRL BLUE (TOWEL DISPOSABLE) ×3 IMPLANT
TRAY LAPAROSCOPIC MC (CUSTOM PROCEDURE TRAY) ×3 IMPLANT
TROCAR XCEL BLUNT TIP 100MML (ENDOMECHANICALS) ×3 IMPLANT
TROCAR XCEL NON-BLD 5MMX100MML (ENDOMECHANICALS) ×3 IMPLANT
TUBING INSUFFLATION (TUBING) ×3 IMPLANT

## 2015-09-18 NOTE — Anesthesia Procedure Notes (Signed)
Procedure Name: Intubation Date/Time: 09/18/2015 12:08 PM Performed by: Rush Farmer E Pre-anesthesia Checklist: Patient identified, Emergency Drugs available, Suction available, Patient being monitored and Timeout performed Patient Re-evaluated:Patient Re-evaluated prior to inductionOxygen Delivery Method: Circle system utilized Preoxygenation: Pre-oxygenation with 100% oxygen Intubation Type: IV induction Ventilation: Mask ventilation without difficulty Laryngoscope Size: Mac and 3 Grade View: Grade I Tube type: Oral Tube size: 7.0 mm Number of attempts: 1 Airway Equipment and Method: Stylet Placement Confirmation: ETT inserted through vocal cords under direct vision,  positive ETCO2 and breath sounds checked- equal and bilateral Secured at: 21 cm Tube secured with: Tape Dental Injury: Teeth and Oropharynx as per pre-operative assessment

## 2015-09-18 NOTE — Interval H&P Note (Signed)
History and Physical Interval Note:no change in H and P  09/18/2015 10:26 AM  [name] [name]  has presented today for surgery, with the diagnosis of Symptomatic chololithias  The various methods of treatment have been discussed with the patient and family. After consideration of risks, benefits and other options for treatment, the patient has consented to  Procedure(s): LAPAROSCOPIC CHOLECYSTECTOMY (N/A) as a surgical intervention .  The patient's history has been reviewed, patient examined, no change in status, stable for surgery.  I have reviewed the patient's chart and labs.  Questions were answered to the patient's satisfaction.     [name] A

## 2015-09-18 NOTE — Discharge Instructions (Signed)
[facility] ______[facility] °LAPAROSCOPIC SURGERY: POST OP INSTRUCTIONS °Always review your discharge instruction sheet given to you by the facility where your surgery was performed. °IF YOU HAVE DISABILITY OR FAMILY LEAVE FORMS, YOU MUST BRING THEM TO THE OFFICE FOR PROCESSING.   °DO NOT GIVE THEM TO YOUR DOCTOR. ° °1. A prescription for pain medication may be given to you upon discharge.  Take your pain medication as prescribed, if needed.  If narcotic pain medicine is not needed, then you may take acetaminophen (Tylenol) or ibuprofen (Advil) as needed. °2. Take your usually prescribed medications unless otherwise directed. °3. If you need a refill on your pain medication, please contact your pharmacy.  They will contact our office to request authorization. Prescriptions will not be filled after 5pm or on week-ends. °4. You should follow a light diet the first few days after arrival home, such as soup and crackers, etc.  Be sure to include lots of fluids daily. °5. Most patients will experience some swelling and bruising in the area of the incisions.  Ice packs will help.  Swelling and bruising can take several days to resolve.  °6. It is common to experience some constipation if taking pain medication after surgery.  Increasing fluid intake and taking a stool softener (such as Colace) will usually help or prevent this problem from occurring.  A mild laxative (Milk of Magnesia or Miralax) should be taken according to package instructions if there are no bowel movements after 48 hours. °7. Unless discharge instructions indicate otherwise, you may remove your bandages 24-48 hours after surgery, and you may shower at that time.  You may have steri-strips (small skin tapes) in place directly over the incision.  These strips should be left on the skin for 7-10 days.  If your surgeon used skin glue on the incision, you may shower in 24 hours.  The glue will flake off over the next 2-3 weeks.  Any sutures or  staples will be removed at the office during your follow-up visit. °8. ACTIVITIES:  You may resume regular (light) daily activities beginning the next day--such as daily self-care, walking, climbing stairs--gradually increasing activities as tolerated.  You may have sexual intercourse when it is comfortable.  Refrain from any heavy lifting or straining until approved by your doctor. °a. You may drive when you are no longer taking prescription pain medication, you can comfortably wear a seatbelt, and you can safely maneuver your car and apply brakes. °b. RETURN TO WORK:  __________________________________________________________ °9. You should see your doctor in the office for a follow-up appointment approximately 2-3 weeks after your surgery.  Make sure that you call for this appointment within a day or two after you arrive home to insure a convenient appointment time. °10. OTHER INSTRUCTIONS:NO LIFTING MORE THAN 15 TO 20 POUNDS FOR 2 WEEKS __________________________________________________________________________________________________________________________ __________________________________________________________________________________________________________________________ °WHEN TO CALL YOUR DOCTOR: °1. Fever over 101.0 °2. Inability to urinate °3. Continued bleeding from incision. °4. Increased pain, redness, or drainage from the incision. °5. Increasing abdominal pain ° °The clinic staff is available to answer your questions during regular business hours.  Please don’t hesitate to call and ask to speak to one of the nurses for clinical concerns.  If you have a medical emergency, go to the nearest emergency room or call 911.  A surgeon from [facility] is always on call at the hospital. °[address], [address], [place], [place]  [address] ? [address] [address], [place], [place]   [address] °[phone]  [phone] ? [phone] ? FAX [phone] Web site: [email or web address]

## 2015-09-18 NOTE — Transfer of Care (Signed)
Immediate Anesthesia Transfer of Care Note  Patient: [name] [name]  Procedure(s) Performed: Procedure(s): LAPAROSCOPIC CHOLECYSTECTOMY (N/A)  Patient Location: PACU  Anesthesia Type:General  Level of Consciousness: awake, alert  and oriented  Airway & Oxygen Therapy: Patient Spontanous Breathing and Patient connected to nasal cannula oxygen  Post-op Assessment: Report given to RN, Post -op Vital signs reviewed and stable and Patient moving all extremities X 4  Post vital signs: Reviewed and stable  Last Vitals:  Filed Vitals:   09/18/15 1115 09/18/15 1256  BP: 129/96   Pulse: 72   Temp: 36.8 C 36.4 C  Resp: 20     Complications: No apparent anesthesia complications

## 2015-09-18 NOTE — Op Note (Signed)
Laparoscopic Cholecystectomy Procedure Note  Indications: This patient presents with symptomatic gallbladder disease and will undergo laparoscopic cholecystectomy.  She had an ultrasound with gallstones, a thickened gallbladder wall, and some pericholecystitic fluid and a positive HIDA scan  Pre-operative Diagnosis: chronic cholecystitis with cholelithiasis  Post-operative Diagnosis: Same  Surgeon: Coralie Keens A   Assistants: 0  Anesthesia: General endotracheal anesthesia  ASA Class: 2  Procedure Details  The patient was seen again in the Holding Room. The risks, benefits, complications, treatment options, and expected outcomes were discussed with the patient. The possibilities of reaction to medication, pulmonary aspiration, perforation of viscus, bleeding, recurrent infection, finding a normal gallbladder, the need for additional procedures, failure to diagnose a condition, the possible need to convert to an open procedure, and creating a complication requiring transfusion or operation were discussed with the patient. The likelihood of improving the patient's symptoms with return to their baseline status is good.  The patient and/or family concurred with the proposed plan, giving informed consent. The site of surgery properly noted. The patient was taken to Operating Room, identified as [name] [name] and the procedure verified as Laparoscopic Cholecystectomy with Intraoperative Cholangiogram. A Time Out was held and the above information confirmed.  Prior to the induction of general anesthesia, antibiotic prophylaxis was administered. General endotracheal anesthesia was then administered and tolerated well. After the induction, the abdomen was prepped with Chloraprep and draped in sterile fashion. The patient was positioned in the supine position.  Local anesthetic agent was injected into the skin near the umbilicus and an incision made. We dissected down to the abdominal fascia with  blunt dissection.  The fascia was incised vertically and we entered the peritoneal cavity bluntly.  A pursestring suture of 0-Vicryl was placed around the fascial opening.  The Hasson cannula was inserted and secured with the stay suture.  Pneumoperitoneum was then created with CO2 and tolerated well without any adverse changes in the patient's vital signs. A 5-mm port was placed in the subxiphoid position.  Two 5-mm ports were placed in the right upper quadrant. All skin incisions were infiltrated with a local anesthetic agent before making the incision and placing the trocars.   We positioned the patient in reverse Trendelenburg, tilted slightly to the patient's left.  The gallbladder was identified and was thick walled in appearance.  The fundus grasped and retracted cephalad. Adhesions were lysed bluntly and with the electrocautery where indicated, taking care not to injure any adjacent organs or viscus. The infundibulum was grasped and retracted laterally, exposing the peritoneum overlying the triangle of Calot. This was then divided and exposed in a blunt fashion. The cystic duct was clearly identified and bluntly dissected circumferentially. A critical view of the cystic duct and cystic artery was obtained.  The cystic duct was then ligated with clips and divided. The cystic artery was, dissected free, ligated with clips and divided as well.   The gallbladder was dissected from the liver bed in retrograde fashion with the electrocautery. The gallbladder was removed and placed in an Endocatch sac. The liver bed was irrigated and inspected. Hemostasis was achieved with the electrocautery. Copious irrigation was utilized and was repeatedly aspirated until clear.  The gallbladder and Endocatch sac were then removed through the umbilical port site.  The pursestring suture was used to close the umbilical fascia.    We again inspected the right upper quadrant for hemostasis.  Pneumoperitoneum was released as  we removed the trocars.  4-0 Monocryl was used to close the  skin.   Skin glue was then applied. The patient was then extubated and brought to the recovery room in stable condition. Instrument, sponge, and needle counts were correct at closure and at the conclusion of the case.   Findings: Chronic Cholecystitis with Cholelithiasis  Estimated Blood Loss: Minimal         Drains: 0         Specimens: Gallbladder           Complications: None; patient tolerated the procedure well.         Disposition: PACU - hemodynamically stable.         Condition: stable

## 2015-09-18 NOTE — Anesthesia Preprocedure Evaluation (Signed)
Anesthesia Evaluation  Patient identified by MRN, date of birth, ID band Patient awake    Reviewed: Allergy & Precautions, NPO status , Patient's Chart, lab work & pertinent test results  History of Anesthesia Complications (+) PONV and history of anesthetic complications  Airway Mallampati: II  TM Distance: >3 FB Neck ROM: Full    Dental  (+) Teeth Intact   Pulmonary neg shortness of breath, asthma , neg sleep apnea, neg COPD, neg recent URI, neg PE   breath sounds clear to auscultation       Cardiovascular hypertension,  Rhythm:Regular     Neuro/Psych  Headaches, neg Seizures PSYCHIATRIC DISORDERS Anxiety  Neuromuscular disease    GI/Hepatic Neg liver ROS, GERD  Medicated,Gallstones    Endo/Other  negative endocrine ROS  Renal/GU negative Renal ROS     Musculoskeletal  (+) Arthritis , Fibromyalgia -  Abdominal   Peds  Hematology negative hematology ROS (+)   Anesthesia Other Findings   Reproductive/Obstetrics                             Anesthesia Physical Anesthesia Plan  ASA: III  Anesthesia Plan: General   Post-op Pain Management:    Induction: Intravenous  Airway Management Planned: Oral ETT  Additional Equipment: None  Intra-op Plan:   Post-operative Plan: Extubation in OR  Informed Consent: I have reviewed the patients History and Physical, chart, labs and discussed the procedure including the risks, benefits and alternatives for the proposed anesthesia with the patient or authorized representative who has indicated his/her understanding and acceptance.   Dental advisory given  Plan Discussed with: CRNA and Surgeon  Anesthesia Plan Comments:         Anesthesia Quick Evaluation

## 2015-09-20 NOTE — Anesthesia Postprocedure Evaluation (Signed)
Anesthesia Post Note  Patient: [name] [name]  Procedure(s) Performed: Procedure(s) (LRB): LAPAROSCOPIC CHOLECYSTECTOMY (N/A)  Patient location during evaluation: PACU Anesthesia Type: General Level of consciousness: awake Pain management: pain level controlled Vital Signs Assessment: post-procedure vital signs reviewed and stable Respiratory status: spontaneous breathing Cardiovascular status: stable Postop Assessment: no signs of nausea or vomiting Anesthetic complications: no    Last Vitals:  Filed Vitals:   09/18/15 1445 09/18/15 1540  BP:    Pulse: 74   Temp:  36.6 C  Resp: 13     Last Pain:  Filed Vitals:   09/18/15 1657  PainSc: Asleep                 Mclean Moya

## 2015-09-21 ENCOUNTER — Encounter: Payer: Self-pay | Admitting: General Surgery

## 2015-09-21 NOTE — Progress Notes (Signed)
She is s/p lap chole with Dr. Ninfa Linden on 2/16.  She awoke early this AM with a rash on her abdomen, back, thighs.  She has been taking Augmentin and Lortab which she has taken before.  She describes a drug rash most likely to one of the above medications.  I recommended she stop both of the medications and take Benadryl.  If that does not work, she made need a Medrol dose pack.  I told her to call her PCP if it came to that.

## 2015-09-22 ENCOUNTER — Encounter (HOSPITAL_COMMUNITY): Payer: Self-pay | Admitting: Surgery

## 2015-10-08 ENCOUNTER — Emergency Department (HOSPITAL_COMMUNITY): Payer: PRIVATE HEALTH INSURANCE

## 2015-10-08 ENCOUNTER — Emergency Department (HOSPITAL_COMMUNITY)
Admission: EM | Admit: 2015-10-08 | Discharge: 2015-10-09 | Disposition: A | Discharge status: Home / Self Care | Payer: PRIVATE HEALTH INSURANCE | Attending: Emergency Medicine | Admitting: Emergency Medicine

## 2015-10-08 ENCOUNTER — Encounter (HOSPITAL_COMMUNITY): Payer: Self-pay | Admitting: Emergency Medicine

## 2015-10-08 DIAGNOSIS — R109 Unspecified abdominal pain: Secondary | ICD-10-CM

## 2015-10-08 DIAGNOSIS — J45909 Unspecified asthma, uncomplicated: Secondary | ICD-10-CM | POA: Insufficient documentation

## 2015-10-08 DIAGNOSIS — Z79899 Other long term (current) drug therapy: Secondary | ICD-10-CM | POA: Diagnosis not present

## 2015-10-08 DIAGNOSIS — M19042 Primary osteoarthritis, left hand: Secondary | ICD-10-CM | POA: Diagnosis not present

## 2015-10-08 DIAGNOSIS — I10 Essential (primary) hypertension: Secondary | ICD-10-CM | POA: Diagnosis not present

## 2015-10-08 DIAGNOSIS — M19041 Primary osteoarthritis, right hand: Secondary | ICD-10-CM | POA: Insufficient documentation

## 2015-10-08 DIAGNOSIS — R1011 Right upper quadrant pain: Secondary | ICD-10-CM | POA: Insufficient documentation

## 2015-10-08 LAB — COMPREHENSIVE METABOLIC PANEL
ALK PHOS: 67 U/L (ref 38–126)
ALT: 21 U/L (ref 14–54)
AST: 24 U/L (ref 15–41)
Albumin: 4.1 g/dL (ref 3.5–5.0)
Anion gap: 13 (ref 5–15)
BILIRUBIN TOTAL: 1 mg/dL (ref 0.3–1.2)
BUN: 18 mg/dL (ref 6–20)
CALCIUM: 8.6 mg/dL — AB (ref 8.9–10.3)
CO2: 27 mmol/L (ref 22–32)
CREATININE: 0.86 mg/dL (ref 0.44–1.00)
Chloride: 103 mmol/L (ref 101–111)
Glucose, Bld: 117 mg/dL — ABNORMAL HIGH (ref 65–99)
Potassium: 4.3 mmol/L (ref 3.5–5.1)
Sodium: 143 mmol/L (ref 135–145)
TOTAL PROTEIN: 7.5 g/dL (ref 6.5–8.1)

## 2015-10-08 LAB — CBC WITH DIFFERENTIAL/PLATELET
Basophils Absolute: 0 10*3/uL (ref 0.0–0.1)
Basophils Relative: 0 %
EOS PCT: 1 %
Eosinophils Absolute: 0.1 10*3/uL (ref 0.0–0.7)
HCT: 46.4 % — ABNORMAL HIGH (ref 36.0–46.0)
HEMOGLOBIN: 15.1 g/dL — AB (ref 12.0–15.0)
LYMPHS ABS: 0.6 10*3/uL — AB (ref 0.7–4.0)
LYMPHS PCT: 3 %
MCH: 31.1 pg (ref 26.0–34.0)
MCHC: 32.5 g/dL (ref 30.0–36.0)
MCV: 95.5 fL (ref 78.0–100.0)
Monocytes Absolute: 0.5 10*3/uL (ref 0.1–1.0)
Monocytes Relative: 3 %
Neutro Abs: 17 10*3/uL — ABNORMAL HIGH (ref 1.7–7.7)
Neutrophils Relative %: 93 %
PLATELETS: 266 10*3/uL (ref 150–400)
RBC: 4.86 MIL/uL (ref 3.87–5.11)
RDW: 12.9 % (ref 11.5–15.5)
WBC: 18.2 10*3/uL — AB (ref 4.0–10.5)

## 2015-10-08 LAB — URINALYSIS, ROUTINE W REFLEX MICROSCOPIC
BILIRUBIN URINE: NEGATIVE
Glucose, UA: NEGATIVE mg/dL
Hgb urine dipstick: NEGATIVE
LEUKOCYTES UA: NEGATIVE
NITRITE: NEGATIVE
Protein, ur: NEGATIVE mg/dL
SPECIFIC GRAVITY, URINE: 1.02 (ref 1.005–1.030)
pH: 7 (ref 5.0–8.0)

## 2015-10-08 LAB — LIPASE, BLOOD: LIPASE: 31 U/L (ref 11–51)

## 2015-10-08 MED ORDER — IOHEXOL 300 MG/ML  SOLN
100.0000 mL | Freq: Once | INTRAMUSCULAR | Status: AC | PRN
  Administered 2015-10-08: 100 mL via INTRAVENOUS

## 2015-10-08 MED ORDER — SODIUM CHLORIDE 0.9 % IV BOLUS (SEPSIS)
1000.0000 mL | Freq: Once | INTRAVENOUS | Status: AC
  Administered 2015-10-08: 1000 mL via INTRAVENOUS

## 2015-10-08 MED ORDER — LIDOCAINE HCL (PF) 1 % IJ SOLN
INTRAMUSCULAR | Status: AC
  Filled 2015-10-08: qty 5

## 2015-10-08 MED ORDER — DIPHENHYDRAMINE HCL 50 MG/ML IJ SOLN
25.0000 mg | Freq: Once | INTRAMUSCULAR | Status: AC
  Administered 2015-10-08: 25 mg via INTRAVENOUS

## 2015-10-08 MED ORDER — LIDOCAINE-EPINEPHRINE (PF) 1 %-1:200000 IJ SOLN: 10.0000 mL | Freq: Once | INTRAMUSCULAR | Status: DC

## 2015-10-08 MED ORDER — DEXAMETHASONE SODIUM PHOSPHATE 4 MG/ML IJ SOLN: 10.0000 mg | Freq: Once | INTRAMUSCULAR | Status: DC

## 2015-10-08 MED ORDER — DIPHENHYDRAMINE HCL 50 MG/ML IJ SOLN
INTRAMUSCULAR | Status: AC
  Filled 2015-10-08: qty 1

## 2015-10-08 MED ORDER — KETOROLAC TROMETHAMINE 30 MG/ML IJ SOLN
15.0000 mg | Freq: Once | INTRAMUSCULAR | Status: AC
  Administered 2015-10-08: 15 mg via INTRAVENOUS
  Filled 2015-10-08: qty 1

## 2015-10-08 MED ORDER — DEXAMETHASONE SODIUM PHOSPHATE 10 MG/ML IJ SOLN
INTRAMUSCULAR | Status: AC
  Administered 2015-10-08: 10 mg
  Filled 2015-10-08: qty 1

## 2015-10-08 MED ORDER — ONDANSETRON HCL 4 MG/2ML IJ SOLN
4.0000 mg | Freq: Once | INTRAMUSCULAR | Status: AC
  Administered 2015-10-08: 4 mg via INTRAVENOUS
  Filled 2015-10-08: qty 2

## 2015-10-08 MED ORDER — KETOROLAC TROMETHAMINE 30 MG/ML IJ SOLN
30.0000 mg | Freq: Once | INTRAMUSCULAR | Status: AC
  Administered 2015-10-08: 30 mg via INTRAVENOUS
  Filled 2015-10-08: qty 1

## 2015-10-08 MED ORDER — EPINEPHRINE 0.3 MG/0.3ML IJ SOAJ
0.3000 mg | Freq: Once | INTRAMUSCULAR | Status: AC
  Administered 2015-10-08: 0.3 mg via INTRAMUSCULAR
  Filled 2015-10-08: qty 0.3

## 2015-10-08 NOTE — ED Notes (Signed)
Assisted patient to bathroom, tolerated well.

## 2015-10-08 NOTE — ED Provider Notes (Signed)
CSN: [id number]     Arrival date & time 10/08/15  1433 History   First MD Initiated Contact with Patient 10/08/15 1450     Chief Complaint  Patient presents with  . Flank Pain  . Emesis     (Consider location/radiation/quality/duration/timing/severity/associated sxs/prior Treatment) Patient is a 50 y.o. female presenting with abdominal pain.  Abdominal Pain Pain location:  R flank Pain quality: aching, cramping, fullness and sharp   Pain radiates to:  RUQ Pain severity:  Mild Timing:  Constant Progression:  Worsening Chronicity:  New Context: not alcohol use, not previous surgeries, not retching and not suspicious food intake   Relieved by:  None tried Worsened by:  Nothing tried Ineffective treatments:  None tried Associated symptoms: nausea and vomiting   Associated symptoms: no anorexia, no chills, no fever and no shortness of breath   Risk factors: no aspirin use, not obese and not pregnant     Past Medical History  Diagnosis Date  . Chest pain   . GERD (gastroesophageal reflux disease)   . Headache(784.0)     "often; not daily" (07/12/2012)  . Migraine   . Arthritis     "hands" (07/12/2012)  . Fibromyalgia   . Chest pain at rest, on going 07/12/2012  . Family history of early CAD 07/12/2012  . Hypertension     not on medications  . Pneumonia     as a child  . Anxiety     situational  . Asthma     seasonal   . Kidney stones   . Anemia     many years ago  . PONV (postoperative nausea and vomiting)     she states she gets very sick   Past Surgical History  Procedure Laterality Date  . Cardiac catheterization  07/11/2012  . Tonsillectomy  ~ 1976  . Vaginal hysterectomy  ~ 2009  . Left heart catheterization with coronary angiogram N/A 07/11/2012    Procedure: LEFT HEART CATHETERIZATION WITH CORONARY ANGIOGRAM;  Surgeon: Lorretta Harp, MD;  Location: [facility] CATH LAB;  Service: Cardiovascular;  Laterality: N/A;  . Breast lift    . Tubes and ovaries removed   2015  . Cholecystectomy N/A 09/18/2015    Procedure: LAPAROSCOPIC CHOLECYSTECTOMY;  Surgeon: Coralie Keens, MD;  Location: [facility] OR;  Service: General;  Laterality: N/A;   Family History  Problem Relation Age of Onset  . Hiatal hernia Mother   . Heart disease Father    Social History  Substance Use Topics  . Smoking status: Never Smoker   . Smokeless tobacco: Never Used  . Alcohol Use: Yes     Comment: rarely   OB History    No data available     Review of Systems  Constitutional: Negative for fever and chills.  Respiratory: Negative for shortness of breath.   Gastrointestinal: Positive for nausea, vomiting and abdominal pain. Negative for anorexia.  All other systems reviewed and are negative.     Allergies  Morphine and related; Erythromycin; Iohexol; Zithromax; Avelox; Levaquin; Augmentin; Dilaudid; Oxycodone hcl; and Tramadol  Home Medications   Prior to Admission medications   Medication Sig Start Date End Date Taking? Authorizing Provider  BIOTIN PO Take 1 tablet by mouth daily.   Yes Historical Provider, MD  budesonide-formoterol (SYMBICORT) 80-4.5 MCG/ACT inhaler Inhale 2 puffs into the lungs daily.   Yes Historical Provider, MD  calcium-vitamin D (OSCAL WITH D) 500-200 MG-UNIT tablet Take 1 tablet by mouth daily with breakfast.   Yes Historical  Provider, MD  cyclobenzaprine (FLEXERIL) 10 MG tablet Take 10 mg by mouth at bedtime.   Yes Historical Provider, MD  estradiol (ESTRACE) 1 MG tablet Take 1 mg by mouth daily. 09/03/15  Yes Historical Provider, MD  levocetirizine (XYZAL) 5 MG tablet Take 5 mg by mouth every evening.   Yes Historical Provider, MD  montelukast (SINGULAIR) 10 MG tablet Take 10 mg by mouth at bedtime.   Yes Historical Provider, MD  Multiple Vitamin (MULTIVITAMIN WITH MINERALS) TABS tablet Take 1 tablet by mouth daily.   Yes Historical Provider, MD  Omega-3 Fatty Acids (FISH OIL PO) Take 1 capsule by mouth daily.   Yes Historical Provider, MD   RABEprazole (ACIPHEX) 20 MG tablet Take 20 mg by mouth daily.   Yes Historical Provider, MD  VITAMIN E PO Take 1 tablet by mouth daily.   Yes Historical Provider, MD  HYDROcodone-acetaminophen (HYCET) 7.5-325 mg/15 ml solution Take 5-10 mLs by mouth 4 (four) times daily as needed for moderate pain. Patient taking differently: Take 2.5-5 mLs by mouth 4 (four) times daily as needed for moderate pain.  09/11/15 09/10/16  Virgel Manifold, MD  ondansetron (ZOFRAN-ODT) 4 MG disintegrating tablet Take 4-8 mg by mouth every 8 (eight) hours as needed for nausea or vomiting.    Historical Provider, MD  PRESCRIPTION MEDICATION 3 (three) times a week. *Allergy Shots*    Historical Provider, MD  rizatriptan (MAXALT-MLT) 10 MG disintegrating tablet Take 1 tablet by mouth every 2 (two) hours as needed for migraine.  06/18/15   Historical Provider, MD   BP 109/66 mmHg  Pulse 113  Temp(Src) 97.5 F (36.4 C) (Oral)  Resp 22  Ht 5\' 5"  (1.651 m)  SpO2 96% Physical Exam  Constitutional: She appears well-developed and well-nourished.  HENT:  Head: Normocephalic and atraumatic.  Mouth/Throat: Mucous membranes are dry.  Eyes: Conjunctivae and EOM are normal. Pupils are equal, round, and reactive to light.  Neck: Normal range of motion.  Cardiovascular: Normal rate and regular rhythm.   Pulmonary/Chest: No stridor. No respiratory distress.  Abdominal: Soft. Bowel sounds are normal. She exhibits no distension. There is no tenderness.  Right flank pain that radiates to right upper quadrant  Musculoskeletal: Normal range of motion. She exhibits no edema or tenderness.  Neurological: She is alert.  Skin: Skin is warm and dry.  Nursing note and vitals reviewed.   ED Course  Procedures (including critical care time)  CRITICAL CARE Performed by: Merrily Pew   Total critical care time: 30 minutes Critical care time was exclusive of separately billable procedures and treating other patients. Critical care was  necessary to treat or prevent imminent or life-threatening deterioration. Critical care was time spent personally by me on the following activities: development of treatment plan with patient and/or surrogate as well as nursing, discussions with consultants, evaluation of patient's response to treatment, examination of patient, obtaining history from patient or surrogate, ordering and performing treatments and interventions, ordering and review of laboratory studies, ordering and review of radiographic studies, pulse oximetry and re-evaluation of patient's condition.   Labs Review Labs Reviewed  CBC WITH DIFFERENTIAL/PLATELET - Abnormal; Notable for the following:    WBC 18.2 (*)    Hemoglobin 15.1 (*)    HCT 46.4 (*)    Neutro Abs 17.0 (*)    Lymphs Abs 0.6 (*)    All other components within normal limits  COMPREHENSIVE METABOLIC PANEL - Abnormal; Notable for the following:    Glucose, Bld 117 (*)  Calcium 8.6 (*)    All other components within normal limits  URINALYSIS, ROUTINE W REFLEX MICROSCOPIC (NOT AT [facility]) - Abnormal; Notable for the following:    Ketones, ur TRACE (*)    All other components within normal limits  LIPASE, BLOOD    Imaging Review Ct Abdomen Pelvis W Contrast  10/08/2015  CLINICAL DATA:  Three weeks status post cholecystectomy. Worsening right flank pain and vomiting. Evaluate for postop biloma. EXAM: CT ABDOMEN AND PELVIS WITH CONTRAST TECHNIQUE: Multidetector CT imaging of the abdomen and pelvis was performed using the standard protocol following bolus administration of intravenous contrast. CONTRAST:  165mL OMNIPAQUE IOHEXOL 300 MG/ML  SOLN COMPARISON:  09/11/2015 FINDINGS: Lower chest: A 6 mm pulmonary nodule is seen in the right middle lobe on image 1 of series 6. This is located higher than field-of-view on previous studies. Hepatobiliary: Liver is normal in appearance. No liver lesions identified. Surgical clips seen from prior cholecystectomy. No abnormal  fluid collections seen within the gallbladder fossa or elsewhere within the abdomen or pelvis. No evidence of biliary ductal dilatation. Pancreas: No mass, inflammatory changes, or other significant abnormality. Spleen: Within normal limits in size and appearance. Adrenals/Urinary Tract: No masses identified. No evidence of hydronephrosis. Stomach/Bowel: No evidence of obstruction, inflammatory process, or abnormal fluid collections. Vascular/Lymphatic: No pathologically enlarged lymph nodes. No evidence of abdominal aortic aneurysm. Reproductive: Prior hysterectomy noted. Adnexal regions are unremarkable in appearance. Other: None. Musculoskeletal:  No suspicious bone lesions identified. IMPRESSION: Prior cholecystectomy. No evidence of postop biloma, biliary dilatation, hydronephrosis, or other complications. 6 mm indeterminate right middle lobe pulmonary nodule. If the patient is at high risk for bronchogenic carcinoma, follow-up chest CT at 6-12 months is recommended. If the patient is at low risk for bronchogenic carcinoma, follow-up chest CT at 12 months is recommended. This recommendation follows the consensus statement: Guidelines for Management of Small Pulmonary Nodules Detected on CT Scans: A Statement from the [place] as published in Radiology 2005;237:395-400. Electronically Signed   By: Earle Gell M.D.   On: 10/08/2015 18:39   I have personally reviewed and evaluated these images and lab results as part of my medical decision-making.   EKG Interpretation None      MDM   Final diagnoses:  Flank pain   Nephrolithiasis v pyelonephritis v viral syndrome. Less likely GB complications, but will eval with labs and symptomatic treatment.   Labs with leukocytosis only. Discussed with surgery who recommends ct abdomen pelvis w/ contrast to eval for bile leak/complications.   After ct patient with lip and oral swelling and hives c/w anaphylaxis, epi, benadryl, steroids given with  resolution in a couple hours. Plan to obs for another two hours from anaphylaxis standpoint.   CT negative for complications or bile lieak. symptoms improved, nausea improved. Unsure of cause, could be MSK in nature. Will try conservative treatment when discharge.    Merrily Pew, MD 10/10/15 [phone]

## 2015-10-08 NOTE — ED Notes (Signed)
Patient complaining of right flank pain and vomiting for the past 18 hours. States she had gall bladder removal 3 weeks ago. Denies dysuria.

## 2015-10-22 ENCOUNTER — Other Ambulatory Visit: Payer: Self-pay | Admitting: Internal Medicine

## 2015-10-22 ENCOUNTER — Other Ambulatory Visit: Payer: PRIVATE HEALTH INSURANCE

## 2015-10-22 ENCOUNTER — Ambulatory Visit
Admission: RE | Admit: 2015-10-22 | Discharge: 2015-10-22 | Disposition: A | Discharge status: Home / Self Care | Payer: PRIVATE HEALTH INSURANCE | Source: Ambulatory Visit | Attending: Internal Medicine | Admitting: Internal Medicine

## 2015-10-22 DIAGNOSIS — M79662 Pain in left lower leg: Secondary | ICD-10-CM

## 2016-02-09 ENCOUNTER — Other Ambulatory Visit: Payer: Self-pay | Admitting: Internal Medicine

## 2016-02-09 DIAGNOSIS — Z1231 Encounter for screening mammogram for malignant neoplasm of breast: Secondary | ICD-10-CM

## 2016-02-23 ENCOUNTER — Inpatient Hospital Stay: Admission: RE | Admit: 2016-02-23 | Payer: PRIVATE HEALTH INSURANCE | Source: Ambulatory Visit

## 2016-03-01 ENCOUNTER — Ambulatory Visit: Payer: PRIVATE HEALTH INSURANCE

## 2016-03-02 ENCOUNTER — Ambulatory Visit
Admission: RE | Admit: 2016-03-02 | Discharge: 2016-03-02 | Disposition: A | Discharge status: Home / Self Care | Payer: PRIVATE HEALTH INSURANCE | Source: Ambulatory Visit | Attending: Internal Medicine | Admitting: Internal Medicine

## 2016-03-02 DIAGNOSIS — Z1231 Encounter for screening mammogram for malignant neoplasm of breast: Secondary | ICD-10-CM

## 2016-05-07 ENCOUNTER — Other Ambulatory Visit: Payer: Self-pay | Admitting: Internal Medicine

## 2016-05-07 DIAGNOSIS — E65 Localized adiposity: Secondary | ICD-10-CM

## 2016-05-07 DIAGNOSIS — R51 Headache: Principal | ICD-10-CM

## 2016-05-07 DIAGNOSIS — R519 Headache, unspecified: Secondary | ICD-10-CM

## 2016-05-07 DIAGNOSIS — R29898 Other symptoms and signs involving the musculoskeletal system: Secondary | ICD-10-CM

## 2016-05-10 ENCOUNTER — Other Ambulatory Visit (HOSPITAL_COMMUNITY): Payer: Self-pay | Admitting: Internal Medicine

## 2016-05-10 DIAGNOSIS — E65 Localized adiposity: Secondary | ICD-10-CM

## 2016-05-10 DIAGNOSIS — R29898 Other symptoms and signs involving the musculoskeletal system: Secondary | ICD-10-CM

## 2016-05-10 DIAGNOSIS — R519 Headache, unspecified: Secondary | ICD-10-CM

## 2016-05-10 DIAGNOSIS — R51 Headache: Secondary | ICD-10-CM

## 2016-05-11 ENCOUNTER — Other Ambulatory Visit: Payer: Self-pay | Admitting: Vascular Surgery

## 2016-05-11 DIAGNOSIS — I872 Venous insufficiency (chronic) (peripheral): Secondary | ICD-10-CM

## 2016-05-12 ENCOUNTER — Ambulatory Visit (HOSPITAL_COMMUNITY): Payer: PRIVATE HEALTH INSURANCE

## 2016-05-12 ENCOUNTER — Ambulatory Visit (HOSPITAL_COMMUNITY)
Admission: RE | Admit: 2016-05-12 | Discharge: 2016-05-12 | Disposition: A | Discharge status: Home / Self Care | Payer: PRIVATE HEALTH INSURANCE | Source: Ambulatory Visit | Attending: Internal Medicine | Admitting: Internal Medicine

## 2016-05-12 DIAGNOSIS — R29898 Other symptoms and signs involving the musculoskeletal system: Secondary | ICD-10-CM

## 2016-05-12 DIAGNOSIS — E65 Localized adiposity: Secondary | ICD-10-CM

## 2016-05-12 DIAGNOSIS — R51 Headache: Secondary | ICD-10-CM | POA: Diagnosis not present

## 2016-05-12 DIAGNOSIS — R519 Headache, unspecified: Secondary | ICD-10-CM

## 2016-05-12 MED ORDER — GADOBENATE DIMEGLUMINE 529 MG/ML IV SOLN
15.0000 mL | Freq: Once | INTRAVENOUS | Status: AC | PRN
  Administered 2016-05-12: 15 mL via INTRAVENOUS

## 2016-05-13 ENCOUNTER — Encounter: Payer: Self-pay | Admitting: Neurology

## 2016-05-13 ENCOUNTER — Ambulatory Visit (INDEPENDENT_AMBULATORY_CARE_PROVIDER_SITE_OTHER): Payer: PRIVATE HEALTH INSURANCE | Admitting: Neurology

## 2016-05-13 VITALS — BP 130/82 | HR 80 | Resp 20 | Ht 65.0 in | Wt 197.0 lb

## 2016-05-13 DIAGNOSIS — F513 Sleepwalking [somnambulism]: Secondary | ICD-10-CM | POA: Insufficient documentation

## 2016-05-13 DIAGNOSIS — R0683 Snoring: Secondary | ICD-10-CM | POA: Diagnosis not present

## 2016-05-13 DIAGNOSIS — M791 Myalgia, unspecified site: Secondary | ICD-10-CM | POA: Insufficient documentation

## 2016-05-13 DIAGNOSIS — F5102 Adjustment insomnia: Secondary | ICD-10-CM

## 2016-05-13 DIAGNOSIS — G4750 Parasomnia, unspecified: Secondary | ICD-10-CM | POA: Diagnosis not present

## 2016-05-13 NOTE — Progress Notes (Addendum)
SLEEP MEDICINE CLINIC   Provider:  Larey Seat, M D  Referring Provider: Marton Redwood, MD Primary Care Physician:  Marton Redwood, MD  Chief Complaint  Patient presents with  . New Patient (Initial Visit)    husband says she does not snore    HPI:  [name] [name] is a 50 y.o. female , seen here as a referral from Dr. [name] for a new sleep evaluation.  She has been seen in our sleep clinic before,  Exactly 7 years ago underwent a sleep study that diagnosed no apnea, no PLM and no organic disease.  Her last sleep study took place on 05/15/2009 and revealed an AHI of 1.1 even in supine there was no accentuation but in rem sleep her AHI was 9.2. She did not have oxygen desaturations at the time and snoring was noted as being mild. Heart rate varied in normal range and remained in sinus rhythm. We discussed at the time to change her bedtime to an earlier time of day and to try Flexeril as helpful for muscle relaxation and sleep induction at a low dose nightly. That was helpful for years, but not any longer. Mrs Sobiech's husband, an ED Physician, has not noted changes in her sleep, but he is de facto a shift Insurance underwriter.  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 These painful nodes are manifesting like cellulite on extremities and abdomen, and she feels misunderstood.  She is postmenopausal , enrolled into the [facility] in Spring 2016 , lost 40 pounds. Now en plateau. She still gained in girth while losing weight ! One possible explanation is that subcutaneous lipomata can be stimulated by lactic acid, therfore by exercise. She has incontinence problem.  She relates her loss of sleep to her pain all over her body. She already underwent an MRI of the brain and the femur to look at the possibility of having developed lipomata for a central nervous system disease that could correlate to these  pains. The one place she can easily sleep is in the cinema, and in the houses of friends.    Chief complaint according to patient : " this pain controls my life, I am heavy, weak and in pain. "  Tearful   Sleep habits are as follows: She carries a bedtime between 10:30 PM and 1 AM depending on when she expects her husband back. Usually she wakes up when he returns home from work and she tries to avoid this interruption of her sleep. She wakes up at 5 5:30 AM spontaneously without an alarm. She usually takes Flexeril helps her to sleep through the night she does not have nocturnal bathroom needs. Her husband has only witnessed her to snore lightly and has not seen apneas. She has woken herself over the last 8 months several times from snoring or snorting. If she falls asleep in a seated position or in a recliner she may occasionally experience sleep choking but this is not a frequent occurrence. She habitually breathes through the mouth, has frequent rhinitis, septal deviation and sinusitis- all seasonal.   Sleep medical history and family sleep history: Night terrors, feeling chesed , scared, she has yelled loudly in her sleep . Night terrors since childhood, her mother has this condition and sleep walks.    Father had OSA , died 11-03-2012 of CHF, long protracted decline , and death after valve replacemant and bypass. Marland Kitchen  Social history: married to an  ED physician. Business degree, Mudlogger .   Review of Systems: Out of a complete 14 system review, the patient complains of only the following symptoms, and all other reviewed systems are negative.  Epworth score 4 , Fatigue severity score 19  , depression score   Social History   Social History  . Marital status: Married    Spouse name: N/A  . Number of children: N/A  . Years of education: N/A   Occupational History  . Not on file.   Social History Main Topics  . Smoking status: Never Smoker  . Smokeless tobacco: Never Used  .  Alcohol use Yes     Comment: rarely  . Drug use: No  . Sexual activity: Yes   Other Topics Concern  . Not on file   Social History Narrative  . No narrative on file    Family History  Problem Relation Age of Onset  . Hiatal hernia Mother   . Hypertension Mother   . Depression Mother   . Heart disease Father   . Stroke Maternal Grandfather   . Chronic Renal Failure Paternal Grandmother   . Drug abuse Son     Past Medical History:  Diagnosis Date  . Anemia    many years ago  . Anxiety    situational  . Arthritis    "hands" (07/12/2012)  . Asthma    seasonal   . Chest pain   . Chest pain at rest, on going 07/12/2012  . Family history of early CAD 07/12/2012  . Fibromyalgia   . GERD (gastroesophageal reflux disease)   . Headache(784.0)    "often; not daily" (07/12/2012)  . Hypertension    not on medications  . Kidney stones   . Migraine   . Pneumonia    as a child  . PONV (postoperative nausea and vomiting)    she states she gets very sick    Past Surgical History:  Procedure Laterality Date  . breast lift    . CARDIAC CATHETERIZATION  07/11/2012  . CHOLECYSTECTOMY N/A 09/18/2015   Procedure: LAPAROSCOPIC CHOLECYSTECTOMY;  Surgeon: Coralie Keens, MD;  Location: [place];  Service: General;  Laterality: N/A;  . LEFT HEART CATHETERIZATION WITH CORONARY ANGIOGRAM N/A 07/11/2012   Procedure: LEFT HEART CATHETERIZATION WITH CORONARY ANGIOGRAM;  Surgeon: Lorretta Harp, MD;  Location: [facility] CATH LAB;  Service: Cardiovascular;  Laterality: N/A;  . TONSILLECTOMY  ~ 1976  . tubes and ovaries removed  2015  . VAGINAL HYSTERECTOMY  ~ 2009    Current Outpatient Prescriptions  Medication Sig Dispense Refill  . Azelastine-Fluticasone (DYMISTA) 137-50 MCG/ACT SUSP Place into the nose.    Marland Kitchen BIOTIN PO Take 1 tablet by mouth daily.    . budesonide-formoterol (SYMBICORT) 80-4.5 MCG/ACT inhaler Inhale 2 puffs into the lungs daily.    . calcium-vitamin D (OSCAL WITH D)  500-200 MG-UNIT tablet Take 1 tablet by mouth daily with breakfast.    . cyclobenzaprine (FLEXERIL) 10 MG tablet Take 10 mg by mouth at bedtime.    Marland Kitchen HYDROcodone-acetaminophen (HYCET) 7.5-325 mg/15 ml solution Take 5-10 mLs by mouth 4 (four) times daily as needed for moderate pain. (Patient taking differently: Take 2.5-5 mLs by mouth 4 (four) times daily as needed for moderate pain. ) 120 mL 0  . levocetirizine (XYZAL) 5 MG tablet Take 5 mg by mouth every evening.    . montelukast (SINGULAIR) 10 MG tablet Take 10 mg by mouth at bedtime.    Marland Kitchen  Multiple Vitamin (MULTIVITAMIN WITH MINERALS) TABS tablet Take 1 tablet by mouth daily.    . Omega-3 Fatty Acids (FISH OIL PO) Take 1 capsule by mouth daily.    . ondansetron (ZOFRAN-ODT) 4 MG disintegrating tablet Take 4-8 mg by mouth every 8 (eight) hours as needed for nausea or vomiting.    Marland Kitchen PRESCRIPTION MEDICATION 3 (three) times a week. *Allergy Shots*    . RABEprazole (ACIPHEX) 20 MG tablet Take 20 mg by mouth daily.    . rizatriptan (MAXALT-MLT) 10 MG disintegrating tablet Take 1 tablet by mouth every 2 (two) hours as needed for migraine.   11  . VITAMIN E PO Take 1 tablet by mouth daily.     No current facility-administered medications for this visit.     Allergies as of 05/13/2016 - Review Complete 05/13/2016  Allergen Reaction Noted  . Morphine and related Anaphylaxis and Nausea And Vomiting 09/11/2015  . Erythromycin Other (See Comments) 07/11/2012  . Iohexol Hives, Itching, and Swelling 10/08/2015  . Zithromax [azithromycin] Other (See Comments) 07/11/2012  . Avelox [moxifloxacin hcl in nacl] Swelling 10/08/2015  . Levaquin [levofloxacin in d5w] Swelling 10/08/2015  . Augmentin [amoxicillin-pot clavulanate] Nausea And Vomiting 09/17/2015  . Dilaudid [hydromorphone hcl] Nausea And Vomiting 09/17/2015  . Oxycodone hcl Nausea And Vomiting 09/17/2015  . Tramadol Nausea Only 09/11/2015    Vitals: BP 130/82   Pulse 80   Resp 20   Ht 5\' 5"   (1.651 m)   Wt 197 lb (89.4 kg)   BMI 32.78 kg/m  Last Weight:  Wt Readings from Last 1 Encounters:  05/13/16 197 lb (89.4 kg)   [id number] mass index is 32.78 kg/m.     Last Height:   Ht Readings from Last 1 Encounters:  05/13/16 5\' 5"  (1.651 m)    Physical exam:  General: The patient is awake, alert and appears not in acute distress. The patient is well groomed. Head: Normocephalic, atraumatic. Neck is supple. Mallampati 3,  neck circumference: 14.5 . Nasal airflow congested , macroglossia borderline  . Retrognathia is seen.  Cardiovascular:  Regular rate and rhythm , without  murmurs or carotid bruit, and without distended neck veins. Respiratory: Lungs are clear to auscultation. Skin:  Without evidence of edema, or rash Trunk: BMI is elevated . The patient's posture is erect   Neurologic exam : The patient is awake and alert, oriented to place and time.   Memory subjective described as intact.   Attention span & concentration ability appears normal.  Speech is fluent,  without dysarthria, dysphonia or aphasia.  Mood and affect are appropriate.  Cranial nerves: Pupils are equal and briskly reactive to light. Extraocular movements  in vertical and horizontal planes intact and without nystagmus. Visual fields by finger perimetry are intact. Hearing to finger rub intact.   Facial sensation intact to fine touch.  Facial motor strength is symmetric and tongue and uvula move midline. Shoulder shrug was symmetrical.   Motor exam: Normal tone, muscle bulk and symmetric strength in all extremities.  Sensory:  Fine touch, pinprick and vibration were tested in all extremities. Proprioception tested in the upper extremities was normal.  Coordination: Rapid alternating movements in the fingers/hands was normal.  Finger-to-nose maneuver  normal without evidence of ataxia, dysmetria or tremor.  Gait and station: Patient walks without assistive device and is able unassisted to climb up to  the exam table. Strength within normal limits.  Stance is stable and normal.   Deep tendon reflexes: in the  upper and lower extremities are symmetric and intact. Babinski maneuver response is downgoing.  The patient was advised of the nature of the diagnosed sleep disorder , the treatment options and risks for general a health and wellness arising from not treating the condition.  I spent more than 45  minutes of face to face time with the patient. Greater than 50% of time was spent in counseling and coordination of care. We have discussed the diagnosis and differential and I answered the patient's questions.  There is an overlying concern of pain and subjective nodules all over the body. Visible is for my that the skin is dimpled and tight, that her skin is indurated.     Assessment:  After physical and neurologic examination, review of laboratory studies,  Personal review of imaging studies, reports of other /same  Imaging studies ,  Results of polysomnography/ neurophysiology testing and pre-existing records as far as provided in visit., my assessment is   1)  Mrs. on its sleep concerned are related to insomnia, and to have 2 origins at this time but it easily identified 1 she is in pain, #2 she has and entrainment sleep rhythm that she tries to synchronize with her husband's work hours and that often leads her to be only in bed for 5 or 6 hours. She's only a mild snorer. I would like to order a full polysomnography. This will also help me to identify periodic limb movements or pain related spontaneous arousals.  2) she reports being sensitive to medication but has been tolerating Flexeril at night very well. I do not think that she has to change from Flexeril if it gives her still several hours of high quality sleep. I would consider changing her to amitriptyline. She has no cardiac conduct activity issues, she has allergy and exercise-induced asthma.  3) she has a history of migraines but she  has better control to not play a role in her current sleep problems.    Plan:  Treatment plan and additional workup :  PSG with [facility]. Parasomnia montage .  Insomnia help sheet,  Migraine sheet.  RV after sleep study-  I will refer to physicial therapy with deep tissue mobilisation.    Asencion Partridge Lambros Cerro MD  05/13/2016   CC: Marton Redwood, [address] [place], [place] [id number]

## 2016-05-13 NOTE — Patient Instructions (Signed)

## 2016-05-16 ENCOUNTER — Other Ambulatory Visit: Payer: PRIVATE HEALTH INSURANCE

## 2016-06-01 ENCOUNTER — Encounter (HOSPITAL_COMMUNITY): Payer: PRIVATE HEALTH INSURANCE

## 2016-06-02 ENCOUNTER — Encounter: Payer: PRIVATE HEALTH INSURANCE | Admitting: Vascular Surgery

## 2016-06-07 ENCOUNTER — Ambulatory Visit (INDEPENDENT_AMBULATORY_CARE_PROVIDER_SITE_OTHER): Payer: PRIVATE HEALTH INSURANCE | Admitting: Neurology

## 2016-06-07 DIAGNOSIS — M791 Myalgia, unspecified site: Secondary | ICD-10-CM

## 2016-06-07 DIAGNOSIS — G4750 Parasomnia, unspecified: Secondary | ICD-10-CM | POA: Diagnosis not present

## 2016-06-07 DIAGNOSIS — R0683 Snoring: Secondary | ICD-10-CM

## 2016-06-07 DIAGNOSIS — F5102 Adjustment insomnia: Secondary | ICD-10-CM

## 2016-06-14 ENCOUNTER — Telehealth: Payer: Self-pay | Admitting: Neurology

## 2016-06-14 DIAGNOSIS — M791 Myalgia, unspecified site: Secondary | ICD-10-CM

## 2016-06-14 NOTE — Addendum Note (Signed)
Addended by: Larey Seat on: 06/14/2016 05:28 PM   Modules accepted: Orders

## 2016-06-14 NOTE — Telephone Encounter (Signed)
I had an extensive phone call with pt. Pt says that every day since she was seen by Dr. [name] on 05/13/2016, she has felt like she has had increased facial numbness, sluggish walking, "like I am walking through quicksand", and a "lump in my throat" which causes her to choke while eating and drinking. Pt says that she saw Wilda at [facility] and she worked with pt for a connective tissue disorder. 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 Pt says that only female physicians listen to her. Pt says that [facility] from [facility] advised pt to call Dr. [name] to decide how to proceed from here. Pt says that she is very frightened.

## 2016-06-14 NOTE — Telephone Encounter (Signed)
Patient is calling stating face numbness and weakness in her arms and legs has gotten worse. Please call and discuss.

## 2016-06-14 NOTE — Telephone Encounter (Signed)
Dr. [name] spoke to pt by phone today to discuss sleep study results and numbness/weakness. Dr. [name] asked me to place pt on scheduled for 12/19 at 3:00pm. Appt scheduled.

## 2016-06-14 NOTE — Telephone Encounter (Signed)
Dear [name] [name].   Your  sleep study was a simple PSG, and revealed normal sleep architecture.  Very few arousals from sleep, 88% sleep efficiency. I have no explanation for your physical complaints, and no diagnosis.  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   CD

## 2016-06-16 ENCOUNTER — Encounter: Payer: PRIVATE HEALTH INSURANCE | Admitting: Vascular Surgery

## 2016-06-16 ENCOUNTER — Encounter (HOSPITAL_COMMUNITY): Payer: PRIVATE HEALTH INSURANCE

## 2016-06-18 NOTE — Procedures (Signed)
The patient's sleep study did neither document apnea , nor insomnia.  Scanned document in MEDIA.

## 2016-07-14 NOTE — Telephone Encounter (Signed)
Pt called the phone room, and I was skyped to help with this call.  Pt cannot make it to her appt with Dr. [name] on 07/20/16 because she had an opportunity to visit her daughter in [place].   I spoke to Dr. [name]. Dr. [name] is agreeable to an appt on 07/21/2016 at 4:00pm for this pt.  I called pt and advised her that Dr. [name] can see her on 07/21/2016 at 4:00 and to please arrive by 3:45. Pt verbalized understanding and appreciation.

## 2016-07-21 ENCOUNTER — Ambulatory Visit (INDEPENDENT_AMBULATORY_CARE_PROVIDER_SITE_OTHER): Payer: PRIVATE HEALTH INSURANCE | Admitting: Neurology

## 2016-07-21 ENCOUNTER — Encounter: Payer: Self-pay | Admitting: Neurology

## 2016-07-21 VITALS — BP 116/78 | HR 92 | Resp 16 | Ht 65.0 in | Wt 186.0 lb

## 2016-07-21 DIAGNOSIS — M791 Myalgia, unspecified site: Secondary | ICD-10-CM

## 2016-07-21 MED ORDER — DULOXETINE HCL 30 MG PO CPEP: 30.0000 mg | ORAL_CAPSULE | Freq: Every day | ORAL | 3 refills | Status: DC

## 2016-07-21 NOTE — Progress Notes (Signed)
SLEEP MEDICINE CLINIC   Provider:  Larey Seat, M D  Referring Provider: Marton Redwood, MD Primary Care Physician:  Marton Redwood, MD  Chief Complaint  Patient presents with  . Follow-up    Rm 11.     HPI:  [name] [name] is a 50 y.o. female , seen here as a referral from Dr. [name] for a new sleep evaluation.  She has been seen in our sleep clinic before,  Exactly 7 years ago underwent a sleep study that diagnosed no apnea, no PLM and no organic disease.  Her last sleep study took place on 05/15/2009 and revealed an AHI of 1.1 even in supine there was no accentuation but in rem sleep her AHI was 9.2. She did not have oxygen desaturations at the time and snoring was noted as being mild. Heart rate varied in normal range and remained in sinus rhythm. We discussed at the time to change her bedtime to an earlier time of day and to try Flexeril as helpful for muscle relaxation and sleep induction at a low dose nightly. That was helpful for years, but not any longer. 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 These painful nodes are manifesting like cellulite on extremities and abdomen, and she feels misunderstood.  She is postmenopausal , enrolled into the [facility] in Spring 2016 , lost 40 pounds. Now en plateau. She still gained in girth while losing weight ! One possible explanation is that subcutaneous lipomata can be stimulated by lactic acid, therfore by exercise. She has incontinence problem.  She relates her loss of sleep to her pain all over her body. She already underwent an MRI of the brain and the femur to look at the possibility of having developed lipomata for a central nervous system disease that could correlate to these pains. The one place she can easily sleep is  in the cinema, and in the houses of friends.    Chief complaint according to patient : " this pain controls my life, I am heavy, weak and in pain. "  Tearful   Sleep habits are as follows: She carries a bedtime between 10:30 PM and 1 AM depending on when she expects her [name] back. Usually she wakes up when he returns home from work and she tries to avoid this interruption of her sleep. She wakes up at 5 5:30 AM spontaneously without an alarm. She usually takes Flexeril helps her to sleep through the night she does not have nocturnal bathroom needs. Her [name] has only witnessed her to snore lightly and has not seen apneas. She has woken herself over the last 8 months several times from snoring or snorting. If she falls asleep in a seated position or in a recliner she may occasionally experience sleep choking but this is not a frequent occurrence. She habitually breathes through the mouth, has frequent rhinitis, septal deviation and sinusitis- all seasonal.   Sleep medical history and family sleep history: Night terrors, feeling chesed , scared, she has yelled loudly in her sleep . Night terrors since childhood, her mother has this condition and sleep walks.  Father had OSA , died 11-11-12 of CHF, long protracted decline , and death after valve replacemant and bypass. .  Social history: married to an  ED physician.  Business degree, M[employer].   07-21-2016 As the pleasure of seeing [name]. Terrence Dupont today following a sleep study from November 2017, her AHI was 1.2 and there is no apnea going on and she did not have hypoxemia she did not have periodic limb movement arousals, and she had highly efficient sleep was normal sleep architecture including normal REM sleep distribution. Her heart rate remained in normal sinus rhythm. She slept well and feels that she slept well. She had taken maxalt.  She had a negative MRI for lipoma deposits, that she suspected she has.  She believes she has fibromyalgia  or Dercum's disease- is in contact with Rosanne Gutting, in [place] , [place], Was recommended to do dry brushing.   Velda Young recommended Dr Chauncey Cruel. Deveshwar, Rheumatology.  paleo diet, no red food , no dairy.   Epworth 8 , FSS 16.    Review of Systems: Out of a complete 14 system review, the patient complains of only the following symptoms, and all other reviewed systems are negative.  Soreness, rigidity,   Feeling as if muscles are squeezed, whole body achiness.   Social History   Social History  . Marital status: Married    Spouse name: N/A  . Number of children: N/A  . Years of education: N/A   Occupational History  . Not on file.   Social History Main Topics  . Smoking status: Never Smoker  . Smokeless tobacco: Never Used  . Alcohol use Yes     Comment: rarely  . Drug use: No  . Sexual activity: Yes   Other Topics Concern  . Not on file   Social History Narrative  . No narrative on file    Family History  Problem Relation Age of Onset  . Hiatal hernia Mother   . Hypertension Mother   . Depression Mother   . Heart disease Father   . Stroke Maternal Grandfather   . Chronic Renal Failure Paternal Grandmother   . Drug abuse Son     Past Medical History:  Diagnosis Date  . Anemia    many years ago  . Anxiety    situational  . Arthritis    "hands" (07/12/2012)  . Asthma    seasonal   . Chest pain   . Chest pain at rest, on going 07/12/2012  . Family history of early CAD 07/12/2012  . Fibromyalgia   . GERD (gastroesophageal reflux disease)   . Headache(784.0)    "often; not daily" (07/12/2012)  . Hypertension    not on medications  . Kidney stones   . Migraine   . Pneumonia    as a child  . PONV (postoperative nausea and vomiting)    she states she gets very sick    Past Surgical History:  Procedure Laterality Date  . breast lift    . CARDIAC CATHETERIZATION  07/11/2012  . CHOLECYSTECTOMY N/A 09/18/2015   Procedure: LAPAROSCOPIC CHOLECYSTECTOMY;   Surgeon: Coralie Keens, MD;  Location: [facility];  Service: General;  Laterality: N/A;  . LEFT HEART CATHETERIZATION WITH CORONARY ANGIOGRAM N/A 07/11/2012   Procedure: LEFT HEART CATHETERIZATION WITH CORONARY ANGIOGRAM;  Surgeon: Lorretta Harp, MD;  Location: [facility] CATH LAB;  Service: Cardiovascular;  Laterality: N/A;  . TONSILLECTOMY  ~ 1976  . tubes and ovaries removed  2015  . VAGINAL HYSTERECTOMY  ~ 2009    Current Outpatient Prescriptions  Medication Sig Dispense Refill  . calcium-vitamin D (OSCAL WITH D) 500-200 MG-UNIT tablet Take 1 tablet by  mouth daily with breakfast.    . Multiple Vitamin (MULTIVITAMIN WITH MINERALS) TABS tablet Take 1 tablet by mouth daily.    . Azelastine-Fluticasone (DYMISTA) 137-50 MCG/ACT SUSP Place into the nose.    Marland Kitchen BIOTIN PO Take 1 tablet by mouth daily.    . budesonide-formoterol (SYMBICORT) 80-4.5 MCG/ACT inhaler Inhale 2 puffs into the lungs daily.    . cyclobenzaprine (FLEXERIL) 10 MG tablet Take 10 mg by mouth at bedtime.    Marland Kitchen HYDROcodone-acetaminophen (HYCET) 7.5-325 mg/15 ml solution Take 5-10 mLs by mouth 4 (four) times daily as needed for moderate pain. (Patient not taking: Reported on 07/21/2016) 120 mL 0  . levocetirizine (XYZAL) 5 MG tablet Take 5 mg by mouth every evening.    . montelukast (SINGULAIR) 10 MG tablet Take 10 mg by mouth at bedtime.    . Omega-3 Fatty Acids (FISH OIL PO) Take 1 capsule by mouth daily.    . ondansetron (ZOFRAN-ODT) 4 MG disintegrating tablet Take 4-8 mg by mouth every 8 (eight) hours as needed for nausea or vomiting.    Marland Kitchen PRESCRIPTION MEDICATION 3 (three) times a week. *Allergy Shots*    . RABEprazole (ACIPHEX) 20 MG tablet Take 20 mg by mouth daily.    . rizatriptan (MAXALT-MLT) 10 MG disintegrating tablet Take 1 tablet by mouth every 2 (two) hours as needed for migraine.   11  . VITAMIN E PO Take 1 tablet by mouth daily.     No current facility-administered medications for this visit.     Allergies as of  07/21/2016 - Review Complete 07/21/2016  Allergen Reaction Noted  . Morphine and related Anaphylaxis and Nausea And Vomiting 09/11/2015  . Erythromycin Other (See Comments) 07/11/2012  . Iohexol Hives, Itching, and Swelling 10/08/2015  . Zithromax [azithromycin] Other (See Comments) 07/11/2012  . Avelox [moxifloxacin hcl in nacl] Swelling 10/08/2015  . Levaquin [levofloxacin in d5w] Swelling 10/08/2015  . Augmentin [amoxicillin-pot clavulanate] Nausea And Vomiting 09/17/2015  . Dilaudid [hydromorphone hcl] Nausea And Vomiting 09/17/2015  . Oxycodone hcl Nausea And Vomiting 09/17/2015  . Tramadol Nausea Only 09/11/2015    Vitals: BP 116/78   Pulse 92   Resp 16   Ht 5\' 5"  (1.651 m)   Wt 186 lb (84.4 kg)   BMI 30.95 kg/m  Last Weight:  Wt Readings from Last 1 Encounters:  07/21/16 186 lb (84.4 kg)   [id number] mass index is 30.95 kg/m.     Last Height:   Ht Readings from Last 1 Encounters:  07/21/16 5\' 5"  (1.651 m)    Physical exam:  General: The patient is awake, alert and appears not in acute distress. The patient is well groomed. Head: Normocephalic, atraumatic. Neck is supple. Mallampati 3,  neck circumference: 14.5 . Nasal airflow congested , macroglossia borderline  . Retrognathia is seen.  Cardiovascular:  Regular rate and rhythm , without  murmurs or carotid bruit, and without distended neck veins. Respiratory: Lungs are clear to auscultation. Skin:  Without evidence of edema, or rash Trunk: BMI is elevated . The patient's posture is erect   Neurologic exam : The patient is awake and alert, oriented to place and time.   Memory subjective described as intact.   Attention span & concentration ability appears normal.  Speech is fluent,  without dysarthria, dysphonia or aphasia.  Mood and affect are appropriate.  Cranial nerves: Pupils are equal and briskly reactive to light. Extraocular movements  in vertical and horizontal planes intact and without nystagmus. Visual  fields by  finger perimetry are intact. Hearing to finger rub intact.   Facial sensation intact to fine touch.  Facial motor strength is symmetric and tongue and uvula move midline. Shoulder shrug was symmetrical.   Motor exam: Normal tone, muscle bulk and symmetric strength in all extremities.  Sensory:  Fine touch, pinprick and vibration were tested in all extremities. Proprioception tested in the upper extremities was normal.  Coordination: Rapid alternating movements in the fingers/hands was normal.  Finger-to-nose maneuver  normal without evidence of ataxia, dysmetria or tremor.  Gait and station: Patient walks without assistive device and is able unassisted to climb up to the exam table. Strength within normal limits.  Stance is stable and normal.   Deep tendon reflexes: in the  upper and lower extremities are symmetric and intact. Babinski maneuver response is downgoing.  The patient was advised of the nature of the diagnosed sleep disorder , the treatment options and risks for general a health and wellness arising from not treating the condition.  I spent more than 25  minutes of face to face time with the patient. Greater than 50% of time was spent in counseling and coordination of care. We have discussed the diagnosis and differential and I answered the patient's questions.  There is an overlying concern of pain and subjective nodules all over the body. Visible is for my that the skin is dimpled and tight, that her skin is indurated.     Assessment:  After physical and neurologic examination, review of laboratory studies,  Personal review of imaging studies, reports of other /same  Imaging studies ,  Results of polysomnography/ neurophysiology testing and pre-existing records as far as provided in visit., my assessment is   1)  [name] [name]'s  sleep concerns are related to insomnia,  - her sleep in the sleep lab was NORMAL.   2) she reports being sensitive to medication but has been  tolerating Flexeril at night very well.   I would consider changing her to amitriptyline. She has no cardiac conduct activity issues, she has allergy and exercise-induced asthma. 3) she has a history of migraines but she has better control to not play a role in her current sleep problems.  Fibromyalgia, dercum's? Psychosomatic?   Plan:  Treatment plan and additional workup :  Cymbalta , prescribed by Dr [name], I encouraged her to take it.    Asencion Partridge Tasheba Henson MD  07/21/2016   CC: Marton Redwood, [address], [place] [address]

## 2016-08-09 ENCOUNTER — Ambulatory Visit: Payer: Self-pay | Admitting: Neurology

## 2016-08-24 ENCOUNTER — Telehealth: Payer: Self-pay | Admitting: Neurology

## 2016-08-24 NOTE — Telephone Encounter (Signed)
I called pt. She is agreeable to an appt on 09/02/16 at 11:00am. Pt verbalized understanding of new appt date and time.   Pt says that Dr. [name] was supposed to reach out to the specialist in [place], [place] to discuss the possible Dercum's disease and wants to discuss what came from that discussion.

## 2016-08-24 NOTE — Telephone Encounter (Signed)
Pt called request f/u with Dr D. Pt is experiencing in the left leg, arms, numbness in the face. She said the lypomas have increased in numbers since last seen. Please call to schedule an appt  Pt said she has an appt between 10-11:15 on 1/23 and will be available to talk.

## 2016-09-01 ENCOUNTER — Telehealth: Payer: Self-pay | Admitting: Rheumatology

## 2016-09-01 NOTE — Telephone Encounter (Signed)
This referral is not back here. Referrals have been reviewed.

## 2016-09-01 NOTE — Telephone Encounter (Signed)
Has this referral been reviewed? Patient called very upset, she said Dr. [name] sent the referral in December.

## 2016-09-01 NOTE — Telephone Encounter (Addendum)
DR.Herbst is closed to new [pateints. Indefinitely.  MRI results given to her clinic,  E mail trough web site only. Very friendly lady answers phone but cannot give clinical information and always refers back to web site.  Asked for  instructional help for local PT, massage therapy . Could not give information or contact -  No response yet-   Could not leave VM on [name] [name]'s phone. Mail box is full.

## 2016-09-02 ENCOUNTER — Ambulatory Visit (INDEPENDENT_AMBULATORY_CARE_PROVIDER_SITE_OTHER): Payer: PRIVATE HEALTH INSURANCE | Admitting: Neurology

## 2016-09-02 ENCOUNTER — Encounter: Payer: Self-pay | Admitting: Neurology

## 2016-09-02 VITALS — BP 126/89 | HR 80 | Resp 20 | Ht 65.0 in

## 2016-09-02 DIAGNOSIS — M791 Myalgia, unspecified site: Secondary | ICD-10-CM

## 2016-09-02 DIAGNOSIS — E7800 Pure hypercholesterolemia, unspecified: Secondary | ICD-10-CM | POA: Diagnosis not present

## 2016-09-02 NOTE — Progress Notes (Signed)
SLEEP MEDICINE CLINIC   Provider:  Larey Seat, M D  Referring Provider: Marton Redwood, MD Primary Care Physician:  Marton Redwood, MD  Chief Complaint  Patient presents with  . Follow-up    follow up on leg pain    HPI:  [name] [name] is a 51 y.o. female , seen here as a referral from Dr. [name] for a new sleep evaluation.  She has been seen in our sleep clinic before,  Exactly 7 years ago underwent a sleep study that diagnosed no apnea, no PLM and no organic disease.  Her last sleep study took place on 05/15/2009 and revealed an AHI of 1.1 even in supine there was no accentuation but in rem sleep her AHI was 9.2. She did not have oxygen desaturations at the time and snoring was noted as being mild. Heart rate varied in normal range and remained in sinus rhythm. We discussed at the time to change her bedtime to an earlier time of day and to try Flexeril as helpful for muscle relaxation and sleep induction at a low dose nightly. That was helpful for years, but not any longer. [name] [name] husband, an ED Physician, has not noted changes in her sleep, but he is de facto a shift Insurance underwriter.  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 These painful nodes are manifesting like cellulite on extremities and abdomen, and she feels misunderstood.  She is postmenopausal , enrolled into the [facility] in Spring 2016 , lost 40 pounds. Now en plateau. She still gained in girth while losing weight ! One possible explanation is that subcutaneous lipomata can be stimulated by lactic acid, therfore by exercise. She has incontinence problem.  She relates her loss of sleep to her pain all over her body. She already underwent an MRI of the brain and the femur to look at the possibility of having developed lipomata for a central nervous system disease that could correlate to these pains. The one place she can  easily sleep is in the cinema, and in the houses of friends.    Chief complaint according to patient : " this pain controls my life, I am heavy, weak and in pain. " Tearful  Sleep habits are as follows: She carries a bedtime between 10:30 PM and 1 AM depending on when she expects her husband back. Usually she wakes up when he returns home from work and she tries to avoid this interruption of her sleep. She wakes up at 5 5:30 AM spontaneously without an alarm. She usually takes Flexeril helps her to sleep through the night she does not have nocturnal bathroom needs. Her husband has only witnessed her to snore lightly and has not seen apneas. She has woken herself over the last 8 months several times from snoring or snorting. If she falls asleep in a seated position or in a recliner she may occasionally experience sleep choking but this is not a frequent occurrence. She habitually breathes through the mouth, has frequent rhinitis, septal deviation and sinusitis- all seasonal.   Sleep medical history and family sleep history: Night terrors, feeling chesed , scared, she has yelled loudly in her sleep . Night terrors since childhood, her mother has this condition and sleep walks.  Father had OSA , died 11-18-12 of CHF, long protracted decline , and death after valve replacemant and bypass. .  Social history: married to an  ED physician.  Business degree, M[employer].   07-21-2016 I have  the pleasure of seeing [name]. [name] today following a sleep study from November 2017,  her AHI was 1.2 and there is no apnea going on and she did not have hypoxemia she did not have periodic limb movement arousals, and she had highly efficient sleep was normal sleep architecture including normal REM sleep distribution. Her heart rate remained in normal sinus rhythm. She slept well and feels that she slept well. She had taken maxalt.  She had a negative MRI for lipoma deposits, that she suspected she has.  She believes she  has fibromyalgia or Dercum's disease- is in contact with Rosanne Gutting, in [place] , [place], Was recommended to do dry brushing.   Velda Young recommended Dr Chauncey Cruel. Deveshwar, Rheumatology. Dr [name] wrote her referral.  paleo diet, no red food , no dairy.  Epworth 8 , FSS 16.  Interval history from the first of every 2018, [name] [name]  has insomnia related to pain, feels more more hard nodules affecting her extremities and torso, tender to touch, very frustrating. She was able to lose weight but has not felt that the subcutaneous fatty tissue has really changed.  Not longer tearful. She went skiing and had one minor fall. 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 She continues to work with Javier Docker, PT.whohas mapped the painful spots. She exercises to keep her muscle strength.  She has begun taking Cymbalta. Referral to Rheumatology, Dr. Estanislado Pandy is not seeing patients for about a month from now on- I would much appreciate if Dr. Trudie Reed or Charlynne Pander would be willing to see her. She also learned that she has borderline hypercholesterolemia but she follows a vegetarian diet, with eggs, little dairy.    Review of Systems: Out of a complete 14 system review, the patient complains of only the following symptoms, and all other reviewed systems are negative.  Soreness, rigidity,   Feeling as if muscles are squeezed, whole body achiness.   Social History   Social History  . Marital status: Married    Spouse name: N/A  . Number of children: N/A  . Years of education: N/A   Occupational History  . Not on file.   Social History Main Topics  . Smoking status: Never Smoker  . Smokeless tobacco: Never Used  . Alcohol use Yes     Comment: rarely  . Drug use: No  . Sexual activity: Yes   Other Topics Concern  . Not on file   Social History Narrative  . No narrative on file    Family History    Problem Relation Age of Onset  . Hiatal hernia Mother   . Hypertension Mother   . Depression Mother   . Heart disease Father   . Stroke Maternal Grandfather   . Chronic Renal Failure Paternal Grandmother   . Drug abuse Son     Past Medical History:  Diagnosis Date  . Anemia    many years ago  . Anxiety    situational  . Arthritis    "hands" (07/12/2012)  . Asthma    seasonal   . Chest pain   . Chest pain at rest, on going 07/12/2012  . Family history of early CAD 07/12/2012  . Fibromyalgia   . GERD (gastroesophageal reflux disease)   . Headache(784.0)    "often; not daily" (07/12/2012)  . Hypertension  not on medications  . Kidney stones   . Migraine   . Pneumonia    as a child  . PONV (postoperative nausea and vomiting)    she states she gets very sick    Past Surgical History:  Procedure Laterality Date  . breast lift    . CARDIAC CATHETERIZATION  07/11/2012  . CHOLECYSTECTOMY N/A 09/18/2015   Procedure: LAPAROSCOPIC CHOLECYSTECTOMY;  Surgeon: Coralie Keens, MD;  Location: [facility];  Service: General;  Laterality: N/A;  . LEFT HEART CATHETERIZATION WITH CORONARY ANGIOGRAM N/A 07/11/2012   Procedure: LEFT HEART CATHETERIZATION WITH CORONARY ANGIOGRAM;  Surgeon: Lorretta Harp, MD;  Location: [facility] CATH LAB;  Service: Cardiovascular;  Laterality: N/A;  . TONSILLECTOMY  ~ 1976  . tubes and ovaries removed  2015  . VAGINAL HYSTERECTOMY  ~ 2009    Current Outpatient Prescriptions  Medication Sig Dispense Refill  . Azelastine-Fluticasone (DYMISTA) 137-50 MCG/ACT SUSP Place into the nose.    Marland Kitchen BIOTIN PO Take 1 tablet by mouth daily.    . budesonide-formoterol (SYMBICORT) 80-4.5 MCG/ACT inhaler Inhale 2 puffs into the lungs daily.    . calcium-vitamin D (OSCAL WITH D) 500-200 MG-UNIT tablet Take 1 tablet by mouth daily with breakfast.    . cyclobenzaprine (FLEXERIL) 10 MG tablet Take 10 mg by mouth at bedtime.    . DULoxetine (CYMBALTA) 30 MG capsule Take 1  capsule (30 mg total) by mouth daily. 30 capsule 3  . HYDROcodone-acetaminophen (HYCET) 7.5-325 mg/15 ml solution Take 5-10 mLs by mouth 4 (four) times daily as needed for moderate pain. 120 mL 0  . levocetirizine (XYZAL) 5 MG tablet Take 5 mg by mouth every evening.    . montelukast (SINGULAIR) 10 MG tablet Take 10 mg by mouth at bedtime.    . Multiple Vitamin (MULTIVITAMIN WITH MINERALS) TABS tablet Take 1 tablet by mouth daily.    . Omega-3 Fatty Acids (FISH OIL PO) Take 1 capsule by mouth daily.    . ondansetron (ZOFRAN-ODT) 4 MG disintegrating tablet Take 4-8 mg by mouth every 8 (eight) hours as needed for nausea or vomiting.    Marland Kitchen PRESCRIPTION MEDICATION 3 (three) times a week. *Allergy Shots*    . rizatriptan (MAXALT-MLT) 10 MG disintegrating tablet Take 1 tablet by mouth every 2 (two) hours as needed for migraine.   11  . VITAMIN E PO Take 1 tablet by mouth daily.     No current facility-administered medications for this visit.     Allergies as of 09/02/2016 - Review Complete 09/02/2016  Allergen Reaction Noted  . Morphine and related Anaphylaxis and Nausea And Vomiting 09/11/2015  . Erythromycin Other (See Comments) 07/11/2012  . Iohexol Hives, Itching, and Swelling 10/08/2015  . Zithromax [azithromycin] Other (See Comments) 07/11/2012  . Avelox [moxifloxacin hcl in nacl] Swelling 10/08/2015  . Levaquin [levofloxacin in d5w] Swelling 10/08/2015  . Augmentin [amoxicillin-pot clavulanate] Nausea And Vomiting 09/17/2015  . Dilaudid [hydromorphone hcl] Nausea And Vomiting 09/17/2015  . Oxycodone hcl Nausea And Vomiting 09/17/2015  . Tramadol Nausea Only 09/11/2015    Vitals: BP 126/89   Pulse 80   Resp 20   Ht 5\' 5"  (1.651 m)  Last Weight:  Wt Readings from Last 1 Encounters:  07/21/16 186 lb (84.4 kg)   [id number] is no height or weight on file to calculate BMI.     Last Height:   Ht Readings from Last 1 Encounters:  09/02/16 5\' 5"  (1.651 m)    Physical exam:  General:  The patient is awake, alert and appears not in acute distress. The patient is well groomed. Head: Normocephalic, atraumatic. Neck is supple. Mallampati 3,  neck circumference: 14.5 . Nasal airflow congested , macroglossia borderline  . Retrognathia is seen.  Cardiovascular:  Regular rate and rhythm , without  murmurs or carotid bruit, and without distended neck veins. Respiratory: Lungs are clear to auscultation. Skin:  Without evidence of edema, or rash Trunk: BMI is elevated . The patient's posture is erect   Neurologic exam : The patient is awake and alert, oriented to place and time.   Memory subjective described as intact.   Attention span & concentration ability appears normal.  Speech is fluent,  without dysarthria, dysphonia or aphasia.  Mood and affect are appropriate.  Cranial nerves: Pupils are equal and briskly reactive to light. Extraocular movements  in vertical and horizontal planes intact and without nystagmus. Visual fields by finger perimetry are intact. Hearing to finger rub intact. Facial sensation intact to fine touch. Facial motor strength is symmetric and tongue and uvula move midline. Shoulder shrug was symmetrical.  Motor exam: Normal tone, muscle bulk and symmetric strength in all extremities. Sensory:  Fine touch, pinprick and vibration were tested in all extremities. Proprioception tested in the upper extremities was normal. Coordination: Rapid alternating movements in the fingers/hands was normal.  Finger-to-nose maneuver  normal without evidence of ataxia, dysmetria or tremor. Gait and station: Patient walks without assistive device .  She wears a knee brace . Her Strength within normal limits.  Stance is stable and normal.   Deep tendon reflexes: in the upper and lower extremities are symmetric and intact. Babinski maneuver response is downgoing.  The patient was advised of the nature of the diagnosed sleep disorder , the treatment options and risks for  general a health and wellness arising from not treating the condition.  I spent more than 25  minutes of face to face time with the patient. Greater than 50% of time was spent in counseling and coordination of care. We have discussed the diagnosis and differential and I answered the patient's questions.  There is an overlying concern of pain and subjective nodules all over the body. Visible is for my that the skin is dimpled and tight, that her skin is indurated.     Assessment:  After physical and neurologic examination, review of laboratory studies,  Personal review of imaging studies, reports of other /same  Imaging studies ,  Results of polysomnography/ neurophysiology testing and pre-existing records as far as provided in visit., my assessment is   1)  [name] [name]'s  sleep concerns are related to insomnia,  - her sleep in the sleep lab was NORMAL.   2) She has been tolerating Flexeril at night very well. I would consider changing her to amitriptyline. She has no cardiac conduct activity issues, she has allergy and exercise-induced asthma.  3) she has a history of migraines but she has better control to not play a role in her current sleep problems. Insomnia due to generalized aches and pains.   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   Plan:  Treatment plan and additional workup :  Referral for second opinion, Rheumatology.   Cymbalta , prescribed by Dr [name], I encouraged her to take it.   I have failed to connect with Dr. Jonette Eva Blakeley Scheier MD  09/02/2016   CC: Marton Redwood, [place], [place] [address]

## 2016-09-06 ENCOUNTER — Telehealth: Payer: Self-pay | Admitting: Neurology

## 2016-09-06 NOTE — Telephone Encounter (Signed)
[name] [name] with [facility] received a referral for the patient. Their office does not treat Dercum's and would need to be referred to [facility], [place] or [facility] [place].

## 2016-09-09 NOTE — Telephone Encounter (Signed)
Patient has been sent to [facility] telephone [phone] - fax [phone].   I have called Patient as well and relayed to her.

## 2016-09-22 NOTE — Telephone Encounter (Signed)
Called Patient and left her a message asking her to call me back .  Referral - Rheumatology referral DERCUMS Disease.  [facility] cannot see Patient at this because per [facility] limited staff as far as physicians.   [facility] - I have been speaking to [place] she sent records back to clinic and they don't see For [facility]. Disease.   Dr. Amil Amen does not.  Dr. Estanislado Pandy does not .

## 2016-10-21 ENCOUNTER — Encounter: Payer: Self-pay | Admitting: Neurology

## 2016-10-21 ENCOUNTER — Ambulatory Visit (INDEPENDENT_AMBULATORY_CARE_PROVIDER_SITE_OTHER): Payer: PRIVATE HEALTH INSURANCE | Admitting: Neurology

## 2016-10-21 DIAGNOSIS — E65 Localized adiposity: Secondary | ICD-10-CM | POA: Diagnosis not present

## 2016-10-21 DIAGNOSIS — M791 Myalgia, unspecified site: Secondary | ICD-10-CM

## 2016-10-21 NOTE — Progress Notes (Signed)
SLEEP MEDICINE CLINIC   Provider:  Larey Seat, M D  Referring Provider: Marton Redwood, MD Primary Care Physician:  Marton Redwood, MD  Chief Complaint  Patient presents with  . Numbness    right foot cramping and numbness    HPI:  [name] [name] is a 51 y.o. female , seen here as a referral from Dr. [name] for a new sleep evaluation.  She has been seen in our sleep clinic before,and exactly 7 years ago underwent a sleep study that diagnosed no apnea, no PLM and no organic disease.  Her last sleep study took place on 05/15/2009 and revealed an AHI of 1.1 even in supine there was no accentuation but in rem sleep her AHI was 9.2. She did not have oxygen desaturations at the time and snoring was noted as being mild. Heart rate varied in normal range and remained in sinus rhythm. We discussed at the time to change her bedtime to an earlier time of day and to try Flexeril as helpful for muscle relaxation and sleep induction at a low dose nightly. That was helpful for years, but not any longer. Mrs Roettger's husband, an ED Physician, has not noted changes in her sleep, but he is de facto a shift Insurance underwriter.  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 These painful nodes are manifesting like cellulite on extremities and abdomen, and she feels misunderstood.  She is postmenopausal , enrolled into the [facility] in Spring 2016 , lost 40 pounds. Now en plateau. She still gained in girth while losing weight ! One possible explanation is that subcutaneous lipomata can be stimulated by lactic acid, therfore by exercise. She has incontinence problem.  She relates her loss of sleep to her pain all over her body. She already underwent an MRI of the brain and the femur to look at the possibility of having developed lipomata for a central nervous system disease that could correlate to these pains. The one place  she can easily sleep is in the cinema, and in the houses of friends.   07-21-2016 I have  the pleasure of seeing Mrs. Gronewold today following a sleep study from November 2017,  her AHI was 1.2 and there is no apnea going on and she did not have hypoxemia she did not have periodic limb movement arousals, and she had highly efficient sleep was normal sleep architecture including normal REM sleep distribution. Her heart rate remained in normal sinus rhythm. She slept well and feels that she slept well. She had taken maxalt.  She had a negative MRI for lipoma deposits, that she suspected she has.  She believes she has fibromyalgia or Dercum's disease- is in contact with Rosanne Gutting, in [place] , [place], Was recommended to do dry brushing.   Velda Young recommended Dr Chauncey Cruel. Deveshwar, Rheumatology. Dr [name] wrote her referral.  paleo diet, "no red food , no dairy ".  Epworth 8 , FSS 16.  Interval history from the first of every 2018, Mrs. Creason  has insomnia related to pain, feels more more hard nodules affecting her extremities and torso, tender to touch, very frustrating. She was able to lose weight but has not felt that the subcutaneous fatty tissue has really changed.  Not longer tearful.  She went skiing and had one minor fall. 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  She continues to work with Javier Docker, PT.whohas mapped the painful spots. She exercises to keep her muscle strength.  She has begun taking Cymbalta. Referral to Rheumatology, Dr. Estanislado Pandy is not seeing patients for about a month from now on- I would much appreciate if Dr. Trudie Reed or Charlynne Pander would be willing to see her. She also learned that she has borderline hypercholesterolemia but she follows a vegetarian diet, with eggs, little dairy.   Interval history from 10/21/2016, patient has not seen rheumatology at, Dr. Estanislado Pandy declined,  [facility] declined, and apparently neither Dr. Lenna Gilford or Dr. Amil Amen has made appointments for her. She has seen her primary care physician yesterday but feels that she has been placed in a category that does not fit her. She does not believe that this is a psychosomatic expression or related to menopause. She has continued to see physical therapy once a week had helped in her  Myalgia and connective tissue pain- symptoms through the different modality intervention. Continues with therapy under Javier Docker.  She reports subjective right leg heaviness. Having hot flushes and cannot sleep. Hair loss. Dry mouth and dry eyes. Weight gain.  I urged her to resume estradiol and progesterone - These symptoms can be related to menopause. She eats a calorie restricted diet and is very cautious with her food intake, she has a vegetarian but not strictly vegan diet. She still has gained body fat especially at the abdomen and over the triceps. She is very unhappy about this. I offered to send her to medical weight management for second opinion. Dr. Leafy Ro.   Review of Systems: Out of a complete 14 system review, the patient complains of only the following symptoms, and all other reviewed systems are negative.  Soreness, rigidity, Feeling as if muscles are "squeezed" and tight,  Pain sensitive, postmenopausal, fibromyalgia ?, whole body achiness.  Tore MCL while skiing.   Social History   Social History  . Marital status: Married    Spouse name: N/A  . Number of children: N/A  . Years of education: N/A   Occupational History  . Not on file.   Social History Main Topics  . Smoking status: Never Smoker  . Smokeless tobacco: Never Used  . Alcohol use Yes     Comment: rarely  . Drug use: No  . Sexual activity: Yes   Other Topics Concern  . Not on file   Social History Narrative  . No narrative on file    Family History  Problem Relation Age of Onset  . Hiatal hernia Mother   . Hypertension  Mother   . Depression Mother   . Heart disease Father   . Stroke Maternal Grandfather   . Chronic Renal Failure Paternal Grandmother   . Drug abuse Son     Past Medical History:  Diagnosis Date  . Anemia    many years ago  . Anxiety    situational  . Arthritis    "hands" (07/12/2012)  . Asthma    seasonal   . Chest pain   . Chest pain at rest, on going 07/12/2012  . Family history of early CAD 07/12/2012  . Fibromyalgia   . GERD (gastroesophageal reflux disease)   . Headache(784.0)    "often; not daily" (07/12/2012)  . Hypertension    not on medications  . Kidney stones   . Migraine   . Pneumonia    as a child  . PONV (postoperative  nausea and vomiting)    she states she gets very sick    Past Surgical History:  Procedure Laterality Date  . breast lift    . CARDIAC CATHETERIZATION  07/11/2012  . CHOLECYSTECTOMY N/A 09/18/2015   Procedure: LAPAROSCOPIC CHOLECYSTECTOMY;  Surgeon: Coralie Keens, MD;  Location: [facility];  Service: General;  Laterality: N/A;  . LEFT HEART CATHETERIZATION WITH CORONARY ANGIOGRAM N/A 07/11/2012   Procedure: LEFT HEART CATHETERIZATION WITH CORONARY ANGIOGRAM;  Surgeon: Lorretta Harp, MD;  Location: [facility] CATH LAB;  Service: Cardiovascular;  Laterality: N/A;  . TONSILLECTOMY  ~ 1976  . tubes and ovaries removed  2015  . VAGINAL HYSTERECTOMY  ~ 2009    Current Outpatient Prescriptions  Medication Sig Dispense Refill  . cyclobenzaprine (FLEXERIL) 10 MG tablet Take 10 mg by mouth at bedtime.    Marland Kitchen estradiol (ESTRACE) 0.5 MG tablet Take 0.5 mg by mouth daily.    Marland Kitchen levocetirizine (XYZAL) 5 MG tablet Take 5 mg by mouth every evening.    . montelukast (SINGULAIR) 10 MG tablet Take 10 mg by mouth at bedtime.    . ondansetron (ZOFRAN-ODT) 4 MG disintegrating tablet Take 4-8 mg by mouth every 8 (eight) hours as needed for nausea or vomiting.    Marland Kitchen PRESCRIPTION MEDICATION 3 (three) times a week. *Allergy Shots*    . rizatriptan (MAXALT-MLT) 10 MG  disintegrating tablet Take 1 tablet by mouth every 2 (two) hours as needed for migraine.   11   No current facility-administered medications for this visit.     Allergies as of 10/21/2016 - Review Complete 10/21/2016  Allergen Reaction Noted  . Morphine and related Anaphylaxis and Nausea And Vomiting 09/11/2015  . Erythromycin Other (See Comments) 07/11/2012  . Iohexol Hives, Itching, and Swelling 10/08/2015  . Zithromax [azithromycin] Other (See Comments) 07/11/2012  . Avelox [moxifloxacin hcl in nacl] Swelling 10/08/2015  . Levaquin [levofloxacin in d5w] Swelling 10/08/2015  . Augmentin [amoxicillin-pot clavulanate] Nausea And Vomiting 09/17/2015  . Dilaudid [hydromorphone hcl] Nausea And Vomiting 09/17/2015  . Oxycodone hcl Nausea And Vomiting 09/17/2015  . Tramadol Nausea Only 09/11/2015    Vitals: BP 108/76   Pulse 76   Resp 20   Ht 5\' 5"  (1.651 m)   Wt 183 lb (83 kg)   BMI 30.45 kg/m  Last Weight:  Wt Readings from Last 1 Encounters:  10/21/16 183 lb (83 kg)   HUD:JSHF mass index is 30.45 kg/m.     Last Height:   Ht Readings from Last 1 Encounters:  10/21/16 5\' 5"  (1.651 m)    Physical exam:  General: The patient is awake, alert and appears not in acute distress. The patient is well groomed. Head: Normocephalic, atraumatic. Neck is supple. Mallampati 3,  neck circumference: 14.5 . Nasal airflow congested , macroglossia borderline  . Retrognathia is seen.  Cardiovascular:  Regular rate and rhythm , without  murmurs or carotid bruit, and without distended neck veins. Respiratory: Lungs are clear to auscultation. Skin:  Without evidence of edema, or rash Trunk: BMI is elevated . The patient's posture is erect   Neurologic exam : The patient is awake and alert, oriented to place and time.   Memory subjective described as intact.   Attention span & concentration ability appears normal.  Speech is fluent,  without dysarthria, dysphonia or aphasia.  Mood and affect  are appropriate.  Cranial nerves: Pupils are equal and briskly reactive to light. Extraocular movements  in vertical and horizontal planes intact and without nystagmus.  Visual fields by finger perimetry are intact. Hearing to finger rub intact. Facial sensation intact to fine touch. Facial motor strength is symmetric and tongue and uvula move midline. Shoulder shrug was symmetrical.  Motor exam: Normal tone, muscle bulk and symmetric strength in all extremities. Sensory:  Fine touch, pinprick and vibration were tested in all extremities. Proprioception tested in the upper extremities was normal. Coordination: Rapid alternating movements in the fingers/hands was normal.  Finger-to-nose maneuver  normal without evidence of ataxia, dysmetria or tremor. Gait and station: Patient walks without assistive device .  She wears a knee brace . Her Strength within normal limits.  Stance is stable and normal.   Deep tendon reflexes: in the upper and lower extremities are symmetric and intact. Babinski maneuver response is downgoing.  The patient was advised of the nature of the diagnosed sleep disorder , the treatment options and risks for general a health and wellness arising from not treating the condition.  I spent more than 25  minutes of face to face time with the patient. Greater than 50% of time was spent in counseling and coordination of care. We have discussed the diagnosis and differential and I answered the patient's questions.  There is an overlying concern of pain and subjective nodules all over the body. Visible is for my that the skin is dimpled and tight, that her skin is indurated.     Assessment:  After physical and neurologic examination, review of laboratory studies,  Personal review of imaging studies, reports of other /same  Imaging studies ,  Results of polysomnography/ neurophysiology testing and pre-existing records as far as provided in visit., my assessment is   1)  Mrs.Icard's   sleep concerns are related to insomnia,  - her sleep in the sleep lab was NORMAL.  Highly distorted sleep perception. May be  helped with progesterone.   2)  Pain, pain, pain. She has been tolerating Flexeril at night very well. I would consider changing her to amitriptyline. She has no cardiac conduct activity issues, she has allergy and exercise-induced asthma. This helps muscle spasms and tension.   3) she has attended a medical weight cannot program at [facility] but has plateaued in spite of high activity levels and calorie restricted diet. I will ask Dr. Leafy Ro to give her a second opinion in regards to weight loss at the medical weight management Center.   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   Plan:  Cymbalta , prescribed by Dr [name], I encouraged her to take it.   I encourage resuming HRT- low dose estradiol and progesterone at night.  MWM Dr. Trixie Rude.  RV prn    I have repeatedly  failed to connect with Dr. Ezequiel Kayser , [place]   Asencion Partridge Niema Carrara MD  10/21/2016   CC: Marton Redwood, [address] [place], [place] [address]

## 2016-10-21 NOTE — Patient Instructions (Signed)

## 2016-10-22 ENCOUNTER — Encounter: Payer: Self-pay | Admitting: Neurology

## 2016-10-25 ENCOUNTER — Telehealth: Payer: Self-pay | Admitting: Neurology

## 2016-10-25 DIAGNOSIS — D171 Benign lipomatous neoplasm of skin and subcutaneous tissue of trunk: Secondary | ICD-10-CM

## 2016-10-25 DIAGNOSIS — M791 Myalgia, unspecified site: Secondary | ICD-10-CM

## 2016-10-25 NOTE — Telephone Encounter (Signed)
Noted, will route this call back to you so you may document the outcome of you and Dr. Arlean Hopping conversation.

## 2016-10-25 NOTE — Telephone Encounter (Signed)
I just spoke to patient, I will place a call to Dr. Estanislado Pandy upon patient's request.

## 2016-10-25 NOTE — Telephone Encounter (Signed)
Called dr Buckner Malta. Office today and asked for a second opinion appointment, her PA answered, took notes and will call us back in AM. CD

## 2016-10-25 NOTE — Telephone Encounter (Signed)
Dr. [name]    Thank you again for trying to find a source of my pain and numbing sensations. You said the contact you with any changes -- today I awoke to find that the numbing/heavy/ tingling in the right foot and calf have now traveled to mid thigh. I am concerned that as the months are passing I am having increased numbing and tightening - as well heaviness - in various areas. My concerns are that there are no returning to a normal state once the feeling has manifested. Is there any way to find a specialist that deals with connective tissue disease, lymphatic disease , etc in the [place], [place], [place], health care systems ? Is there a way to find anyone else that Is knowledgeable with Dercums other than Ned Grace? She has that conference in April - should I try to get some info from it? Dr. Gweneth Fritter has quite a few treatments - such as compression garments - and procedures that she uses with her patients but without a diagnosis I am in limbo. She also lists tests for diagnosis.     I am still waiting for rheumatology at [facility] or elsewhere to take patient on.   I encouraged her to go to the April conference if she can . I have not d found another [place] physician that has researched Dercum's, and I do not know how to diagnose it.  Her MRI was not indicative of Dercum's   I have asked local Rheumatologist for a connective tissue work up, second opinion. Patient has not seen any MD .    CD

## 2016-10-28 ENCOUNTER — Telehealth: Payer: Self-pay | Admitting: Radiology

## 2016-10-28 NOTE — Telephone Encounter (Signed)
Dr. [name] and Dr. Estanislado Pandy spoke via phone and it was decided that Dr. Estanislado Pandy would see this pt.   Hinton Dyer, will you please let this pt know? Thank you!

## 2016-10-28 NOTE — Telephone Encounter (Signed)
Dr Estanislado Pandy spoke to Dr Maureen Chatters today concerning patient. She has requested 2nd opinion has already seen another rheumatologist. Dr Estanislado Pandy states ok to schedule, but patient must bring her previous rheumatology records with her for the visit.

## 2016-11-01 NOTE — Telephone Encounter (Signed)
Kristen, I have tried to call her x 3 to leave her a message she is not answering as well . All records have been faxed to Dr. Estanislado Pandy  office.

## 2016-11-01 NOTE — Telephone Encounter (Signed)
Ok great, thanks Drummond!

## 2016-11-03 NOTE — Telephone Encounter (Signed)
Left message on machine for patient to call back to schedule new patient appointment with Dr. Estanislado Pandy.

## 2016-11-09 ENCOUNTER — Ambulatory Visit (INDEPENDENT_AMBULATORY_CARE_PROVIDER_SITE_OTHER): Payer: PRIVATE HEALTH INSURANCE | Admitting: Rheumatology

## 2016-11-09 VITALS — BP 118/72 | HR 78 | Resp 14 | Wt 186.0 lb

## 2016-11-09 DIAGNOSIS — Z8669 Personal history of other diseases of the nervous system and sense organs: Secondary | ICD-10-CM

## 2016-11-09 DIAGNOSIS — Z9109 Other allergy status, other than to drugs and biological substances: Secondary | ICD-10-CM | POA: Diagnosis not present

## 2016-11-09 DIAGNOSIS — M791 Myalgia, unspecified site: Secondary | ICD-10-CM

## 2016-11-09 DIAGNOSIS — M2550 Pain in unspecified joint: Secondary | ICD-10-CM

## 2016-11-09 DIAGNOSIS — M797 Fibromyalgia: Secondary | ICD-10-CM | POA: Insufficient documentation

## 2016-11-09 DIAGNOSIS — F5102 Adjustment insomnia: Secondary | ICD-10-CM | POA: Diagnosis not present

## 2016-11-09 DIAGNOSIS — F419 Anxiety disorder, unspecified: Secondary | ICD-10-CM

## 2016-11-09 LAB — CK: Total CK: 105 U/L (ref 7–177)

## 2016-11-09 NOTE — Progress Notes (Signed)
I agree with the assessment and plan as directed by NP .The patient is known to me .   Chayna Surratt, MD  

## 2016-11-09 NOTE — Progress Notes (Signed)
Office Visit Note  Patient: [name] [name]             Date of Birth: 13-Jan-1966           MRN: [id number]             PCP: Marton Redwood, MD Referring: Larey Seat, MD Visit Date: 11/09/2016 Occupation: @GUAROCC @    Subjective:  New Patient (Initial Visit) (patient states she did not get records from previous rheumatologist ) and Alopecia (has seen dermatologist and PCP )   History of Present Illness: [name] [name] is a 51 y.o. female with known history of fibromyalgia syndrome. She's been seen in consultation per request of Dr. Roddie Mc for evaluation of her symptoms. According to patient about 3 years ago she started gaining weight she states despite being aggressive workout she did not see any response in her weight loss. She tried joining [facility] weight loss program without any help. She states about 1-1/2 year ago she started experiencing pain in her left calf left thigh and left knee she was seen at the pain clinic where the Doppler study was negative. She's also noticed some knots on her shoulders and legs and she feels that she has lost muscle mass and gain fat. She's concerned that she has lipomas on her buttocks in her breasts. She's also experienced right-sided facial numbness for which she was seen by an ENT and no workup was performed. She's been seeing Dr. Roddie Mc for her issues and had sleep study which was negative. Dr. Roddie Mc also reviewed her MRI of her brain which was negative. She was referred to [facility] where she's been getting physical therapy. She was seen by her PCP who felt that she should benefit from pain medications. She also has difficulty swallowing. She recalls having MRI of her left thigh region which was negative. Her insomnia is better with Flexeril.  Activities of Daily Living:  Patient reports morning stiffness for all day minutes.   Patient Reports nocturnal pain.  Difficulty dressing/grooming: Denies Difficulty climbing stairs:  Denies Difficulty getting out of chair: Denies Difficulty using hands for taps, buttons, cutlery, and/or writing: Denies   Review of Systems  Constitutional: Positive for fatigue. Negative for night sweats, weight gain, weight loss and weakness.  HENT: Positive for mouth dryness. Negative for mouth sores, trouble swallowing, trouble swallowing and nose dryness.   Eyes: Negative for pain, redness, visual disturbance and dryness.  Respiratory: Negative for cough, shortness of breath and difficulty breathing.   Cardiovascular: Negative for chest pain, palpitations, hypertension, irregular heartbeat and swelling in legs/feet.  Gastrointestinal: Negative for blood in stool, constipation and diarrhea.  Endocrine: Negative for increased urination.  Genitourinary: Negative for vaginal dryness.  Musculoskeletal: Positive for arthralgias, joint pain, myalgias, morning stiffness and myalgias. Negative for joint swelling, muscle weakness and muscle tenderness.  Skin: Negative for color change, rash, hair loss, skin tightness, ulcers and sensitivity to sunlight.  Allergic/Immunologic: Negative for susceptible to infections.  Neurological: Negative for dizziness, memory loss and night sweats.  Hematological: Negative for swollen glands.  Psychiatric/Behavioral: Positive for sleep disturbance. Negative for depressed mood. The patient is nervous/anxious.     PMFS History:  Patient Active Problem List   Diagnosis Date Noted  . Fibromyalgia 11/09/2016  . History of migraine 11/09/2016  . History of environmental allergies 11/09/2016  . Abdominal apron 10/21/2016  . Myalgia 05/13/2016  . Adjustment insomnia 05/13/2016  . Snoring 05/13/2016  . 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 Chest pain at  rest, on going, probable GI source 07/12/2012  . HTN (hypertension) 07/12/2012  . Family history of early CAD 07/12/2012  . Anxiety 07/12/2012    Past Medical History:  Diagnosis Date  . Anemia    many  years ago  . Anxiety    situational  . Arthritis    "hands" (07/12/2012)  . Asthma    seasonal   . Chest pain   . Chest pain at rest, on going 07/12/2012  . Family history of early CAD 07/12/2012  . Fibromyalgia   . GERD (gastroesophageal reflux disease)   . Headache(784.0)    "often; not daily" (07/12/2012)  . Hypertension    not on medications  . Kidney stones   . Migraine   . Pneumonia    as a child  . PONV (postoperative nausea and vomiting)    she states she gets very sick    Family History  Problem Relation Age of Onset  . Hiatal hernia Mother   . Hypertension Mother   . Depression Mother   . Heart disease Father   . Stroke Maternal Grandfather   . Chronic Renal Failure Paternal Grandmother   . Drug abuse Son    Past Surgical History:  Procedure Laterality Date  . breast lift    . CARDIAC CATHETERIZATION  07/11/2012  . CHOLECYSTECTOMY N/A 09/18/2015   Procedure: LAPAROSCOPIC CHOLECYSTECTOMY;  Surgeon: Coralie Keens, MD;  Location: [place];  Service: General;  Laterality: N/A;  . LEFT HEART CATHETERIZATION WITH CORONARY ANGIOGRAM N/A 07/11/2012   Procedure: LEFT HEART CATHETERIZATION WITH CORONARY ANGIOGRAM;  Surgeon: Lorretta Harp, MD;  Location: [facility] CATH LAB;  Service: Cardiovascular;  Laterality: N/A;  . TONSILLECTOMY  ~ 1976  . tubes and ovaries removed  2015  . VAGINAL HYSTERECTOMY  ~ 2009   Social History   Social History Narrative  . No narrative on file     Objective: Vital Signs: BP 118/72   Pulse 78   Resp 14   Wt 186 lb (84.4 kg)   BMI 30.95 kg/m    Physical Exam  Constitutional: She is oriented to person, place, and time. She appears well-developed and well-nourished.  HENT:  Head: Normocephalic and atraumatic.  Eyes: Conjunctivae and EOM are normal.  Neck: Normal range of motion.  Cardiovascular: Normal rate, regular rhythm, normal heart sounds and intact distal pulses.   Pulmonary/Chest: Effort normal and breath sounds normal.   Abdominal: Soft. Bowel sounds are normal.  Lymphadenopathy:    She has no cervical adenopathy.  Neurological: She is alert and oriented to person, place, and time.  Skin: Skin is warm and dry. Capillary refill takes less than 2 seconds.  Psychiatric: She has a normal mood and affect. Her behavior is normal.  Nursing note and vitals reviewed.    Musculoskeletal Exam: C-spine and thoracic lumbar spine good range of motion. Shoulder joints elbow joints wrist joint MCPs PIPs DIPs with good range of motion. Hip joints knee joints ankles MTPs PIPs DIPs with good range of motion. She had 12 out of 18 positive tender points with hyperalgesia.  CDAI Exam: No CDAI exam completed.    Investigation: No additional findings.   Imaging: No results found.  Speciality Comments: No specialty comments available.    Procedures:  No procedures performed Allergies: Morphine and related; Erythromycin; Iohexol; Zithromax [azithromycin]; Avelox [moxifloxacin hcl in nacl]; Levaquin [levofloxacin in d5w]; Augmentin [amoxicillin-pot clavulanate]; Dilaudid [hydromorphone hcl]; Oxycodone hcl; and Tramadol   Assessment / Plan:  Visit Diagnoses: Myalgia - patient complains of generalized myalgias and also micro-lipomas which were very difficult for me to palpate. I believe I only feel subcutaneous tissue. Plan: Sedimentation rate, CK, ANA, Serum protein electrophoresis with reflex  Anxiety: Situational  Adjustment insomnia: At her with Flexeril  Fibromyalgia: Long-standing history of fibromyalgia. Patient stopped Cymbalta I've encouraged her to restart on that.  History of migraine  History of environmental allergies  Polyarthralgia -I do not see any synovitis on examination I'll do following workup and call her with the lab results Plan: Rheumatoid factor, Cyclic citrul peptide antibody, IgG   We had detailed discussion regarding her situation weight gain and generalized aches and pain. I've  advised her to make an appointment at [facility].  Orders: Orders Placed This Encounter  Procedures  . Sedimentation rate  . CK  . ANA  . Rheumatoid factor  . Cyclic citrul peptide antibody, IgG  . Serum protein electrophoresis with reflex   No orders of the defined types were placed in this encounter.   Face-to-face time spent with patient was 60 minutes. 50% of time was spent in counseling and coordination of care.  Follow-Up Instructions: Return if symptoms worsen or fail to improve, for FMS.   Bo Merino, MD  Note - This record has been created using Editor, commissioning.  Chart creation errors have been sought, but may not always  have been located. Such creation errors do not reflect on  the standard of medical care.

## 2016-11-10 LAB — SEDIMENTATION RATE: Sed Rate: 19 mm/hr (ref 0–30)

## 2016-11-10 LAB — RHEUMATOID FACTOR

## 2016-11-10 LAB — CYCLIC CITRUL PEPTIDE ANTIBODY, IGG

## 2016-11-10 LAB — ANA: Anti Nuclear Antibody(ANA): NEGATIVE

## 2016-11-11 LAB — PROTEIN ELECTROPHORESIS, SERUM, WITH REFLEX
Albumin ELP: 4.3 g/dL (ref 3.8–4.8)
Alpha-1-Globulin: 0.6 g/dL — ABNORMAL HIGH (ref 0.2–0.3)
Alpha-2-Globulin: 0.7 g/dL (ref 0.5–0.9)
Beta 2: 0.4 g/dL (ref 0.2–0.5)
Beta Globulin: 0.6 g/dL (ref 0.4–0.6)
GAMMA GLOBULIN: 0.7 g/dL — AB (ref 0.8–1.7)
Total Protein, Serum Electrophoresis: 7.4 g/dL (ref 6.1–8.1)

## 2016-11-11 NOTE — Progress Notes (Signed)
WNL

## 2016-11-18 ENCOUNTER — Encounter (INDEPENDENT_AMBULATORY_CARE_PROVIDER_SITE_OTHER): Payer: PRIVATE HEALTH INSURANCE | Admitting: Family Medicine

## 2016-12-07 ENCOUNTER — Telehealth: Payer: Self-pay | Admitting: Neurology

## 2016-12-07 ENCOUNTER — Encounter: Payer: Self-pay | Admitting: Neurology

## 2016-12-07 ENCOUNTER — Ambulatory Visit (INDEPENDENT_AMBULATORY_CARE_PROVIDER_SITE_OTHER): Payer: PRIVATE HEALTH INSURANCE | Admitting: Neurology

## 2016-12-07 DIAGNOSIS — F5102 Adjustment insomnia: Secondary | ICD-10-CM

## 2016-12-07 DIAGNOSIS — F459 Somatoform disorder, unspecified: Secondary | ICD-10-CM

## 2016-12-07 DIAGNOSIS — M797 Fibromyalgia: Secondary | ICD-10-CM

## 2016-12-07 MED ORDER — QUETIAPINE FUMARATE 25 MG PO TABS: 25.0000 mg | ORAL_TABLET | Freq: Every day | ORAL | 1 refills | Status: DC

## 2016-12-07 NOTE — Progress Notes (Signed)
SLEEP MEDICINE CLINIC   Provider:  Larey Seat, M D  Referring Provider: Marton Redwood, MD Primary Care Physician:  [name] Redwood, MD  Chief Complaint  Patient presents with  . Follow-up    worsening symptoms    HPI:  [name] [name] is a 51 y.o. female , seen here as a referral from [name] [name] for a new sleep evaluation.  She has been seen in our sleep clinic before,and exactly 7 years ago underwent a sleep study that diagnosed no apnea, no PLM and no organic disease.  Her last sleep study took place on 05/15/2009 and revealed an AHI of 1.1 even in supine there was no accentuation but in rem sleep her AHI was 9.2. She did not have oxygen desaturations at the time and snoring was noted as being mild. Heart rate varied in normal range and remained in sinus rhythm. We discussed at the time to change her bedtime to an earlier time of day and to try Flexeril as helpful for muscle relaxation and sleep induction at a low dose nightly. That was helpful for years, but not any longer. [name] [name]'s husband, an ED Physician, has not noted changes in her sleep, but he is de facto a shift Insurance underwriter.  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 These painful nodes are manifesting like cellulite on extremities and abdomen, and she feels misunderstood.  She is postmenopausal , enrolled into [facility] in Spring 2016 , lost 40 pounds. Now en plateau. She still gained in girth while losing weight ! One possible explanation is that subcutaneous lipomata can be stimulated by lactic acid, therfore by exercise. She has incontinence problem.  She relates her loss of sleep to her pain all over her body. She already underwent an MRI of the brain and the femur to look at the possibility of having developed lipomata for a central nervous system disease that could correlate to these pains. The one place she can  easily sleep is in the cinema, and in the houses of friends.   07-21-2016 I have  the pleasure of seeing [name] [name] today following a sleep study from November 2017,  her AHI was 1.2 and there is no apnea going on and she did not have hypoxemia she did not have periodic limb movement arousals, and she had highly efficient sleep was normal sleep architecture including normal REM sleep distribution. Her heart rate remained in normal sinus rhythm. She slept well and feels that she slept well. She had taken maxalt.  She had a negative MRI for lipoma deposits, that she suspected she has.  She believes she has fibromyalgia or Dercum's disease- is in contact with [name] [name], in [place] , [place], Was recommended to do dry brushing.   [name] [name] recommended [name] [name], Rheumatology. [name] [name] wrote her referral.  paleo diet, "no red food , no dairy ".  Epworth 8 , FSS 16.  Interval history from the first of every 2018, [name] [name]  has insomnia related to pain, feels more more hard nodules affecting her extremities and torso, tender to [name], very frustrating. She was able to lose weight but has not felt that the subcutaneous fatty tissue has really changed.  Not longer tearful.  She went skiing and had one minor fall. 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  She continues to work with [name] [name], PT.whohas mapped the painful spots. She exercises to keep her muscle strength.  She has begun taking Cymbalta. Referral to Rheumatology, [name] [name] is not seeing patients for about a month from now on- I would much appreciate if [name] [name] or [name] [name] would be willing to see her. She also learned that she has borderline hypercholesterolemia but she follows a vegetarian diet, with eggs, little dairy.   Interval history from 10/21/2016, patient has not seen rheumatology at, [name] [name] declined, [facility] declined, and apparently neither [name] [name] or [name] [name] has made appointments for her. She has seen her primary care physician yesterday but feels that she has been placed in a category that does not fit her. She does not believe that this is a psychosomatic expression or related to menopause. She has continued to see physical therapy once a week had helped in her  Myalgia and connective tissue pain- symptoms through the different modality intervention. Continues with therapy under [name] [name].  She reports subjective right leg heaviness. Having hot flushes and cannot sleep. Hair loss. Dry mouth and dry eyes. Weight gain.  I urged her to resume estradiol and progesterone - These symptoms can be related to menopause. She eats a calorie restricted diet and is very cautious with her food intake, she has a vegetarian but not strictly vegan diet. She still has gained body fat especially at the abdomen and over the triceps. She is very unhappy about this. I offered to send her to medical weight management for second opinion. [name] [name].   Review of Systems: Out of a complete 14 system review, the patient complains of only the following symptoms, and all other reviewed systems are negative.  Soreness, rigidity, Feeling as if muscles are "squeezed" and tight,  Pain sensitive, postmenopausal, fibromyalgia ?, whole body achiness.  Tore MCL while skiing.   Social History   Social History  . Marital status: Married    Spouse name: N/A  . Number of children: N/A  . Years of education: N/A   Occupational History  . Not on file.   Social History Main Topics  . Smoking status: Never Smoker  . Smokeless tobacco: Never Used  . Alcohol use Yes     Comment: rarely  . Drug use: No  . Sexual activity: Yes   Other Topics Concern  . Not on file   Social History Narrative  . No narrative on file    Family History  Problem Relation Age of Onset  . Hiatal hernia Mother   . Hypertension Mother    . Depression Mother   . Heart disease Father   . Stroke Maternal Grandfather   . Chronic Renal Failure Paternal Grandmother   . Drug abuse Son     Past Medical History:  Diagnosis Date  . Anemia    many years ago  . Anxiety    situational  . Arthritis    "hands" (07/12/2012)  . Asthma    seasonal   . Chest pain   . Chest pain at rest, on going 07/12/2012  . Family history of early CAD 07/12/2012  . Fibromyalgia   . GERD (gastroesophageal reflux disease)   . Headache(784.0)    "often; not daily" (07/12/2012)  . Hypertension    not on medications  . Kidney stones   . Migraine   . Pneumonia    as a child  . PONV (postoperative nausea and vomiting)  she states she gets very sick    Past Surgical History:  Procedure Laterality Date  . breast lift    . CARDIAC CATHETERIZATION  07/11/2012  . CHOLECYSTECTOMY N/A 09/18/2015   Procedure: LAPAROSCOPIC CHOLECYSTECTOMY;  Surgeon: Coralie Keens, MD;  Location: [place];  Service: General;  Laterality: N/A;  . LEFT HEART CATHETERIZATION WITH CORONARY ANGIOGRAM N/A 07/11/2012   Procedure: LEFT HEART CATHETERIZATION WITH CORONARY ANGIOGRAM;  Surgeon: Lorretta Harp, MD;  Location: [facility] CATH LAB;  Service: Cardiovascular;  Laterality: N/A;  . TONSILLECTOMY  ~ 1976  . tubes and ovaries removed  2015  . VAGINAL HYSTERECTOMY  ~ 2009    Current Outpatient Prescriptions  Medication Sig Dispense Refill  . cholecalciferol (VITAMIN D) 1000 units tablet Take 1,000 Units by mouth daily.    . cyclobenzaprine (FLEXERIL) 10 MG tablet Take 10 mg by mouth at bedtime.    Marland Kitchen estradiol (ESTRACE) 0.5 MG tablet Take 0.5 mg by mouth daily.    Marland Kitchen levocetirizine (XYZAL) 5 MG tablet Take 5 mg by mouth every evening.    . montelukast (SINGULAIR) 10 MG tablet Take 10 mg by mouth at bedtime.    . ondansetron (ZOFRAN-ODT) 4 MG disintegrating tablet Take 4-8 mg by mouth every 8 (eight) hours as needed for nausea or vomiting.    Marland Kitchen PRESCRIPTION MEDICATION 3  (three) times a week. *Allergy Shots*    . rizatriptan (MAXALT-MLT) 10 MG disintegrating tablet Take 1 tablet by mouth every 2 (two) hours as needed for migraine.   11   No current facility-administered medications for this visit.     Allergies as of 12/07/2016 - Review Complete 12/07/2016  Allergen Reaction Noted  . Morphine and related Anaphylaxis and Nausea And Vomiting 09/11/2015  . Erythromycin Other (See Comments) 07/11/2012  . Iohexol Hives, Itching, and Swelling 10/08/2015  . Zithromax [azithromycin] Other (See Comments) 07/11/2012  . Avelox [moxifloxacin hcl in nacl] Swelling 10/08/2015  . Levaquin [levofloxacin in d5w] Swelling 10/08/2015  . Augmentin [amoxicillin-pot clavulanate] Nausea And Vomiting 09/17/2015  . Dilaudid [hydromorphone hcl] Nausea And Vomiting 09/17/2015  . Oxycodone hcl Nausea And Vomiting 09/17/2015  . Tramadol Nausea Only 09/11/2015    Vitals: BP 118/80   [name] 80   Ht 5\' 5"  (1.651 m)   Wt 186 lb (84.4 kg)   BMI 30.95 kg/m  Last Weight:  Wt Readings from Last 1 Encounters:  12/07/16 186 lb (84.4 kg)   SWN:IOEV mass index is 30.95 kg/m.     Last Height:   Ht Readings from Last 1 Encounters:  12/07/16 5\' 5"  (1.651 m)    Physical exam:  General: The patient is awake, alert and appears not in acute distress. The patient is well groomed. Head: Normocephalic, atraumatic. Neck is supple. Mallampati 3,  neck circumference: 13.5 !  . Nasal airflow congested , macroglossia borderline . Retrognathia. Cardiovascular:  Regular rate and rhythm , without  murmurs or carotid bruit, and without distended neck veins. Respiratory: Lungs are clear to auscultation. Skin:  Without evidence of pitting edema, or rash. Dimpled skin on abdomen and hips.  .  Trunk: BMI is elevated. The patient's posture is erect   Neurologic exam : The patient is awake and alert, oriented to place and time.  Memory subjective described as intact. Attention span & concentration  ability appears normal.  Speech is fluent,  without dysarthria, dysphonia or aphasia. Mood and affect are agitated and worried.   Cranial nerves: Pupils are equal and briskly reactive to light.  Extraocular movements  in vertical and horizontal planes intact and without nystagmus. Visual fields by finger perimetry are intact.Hearing to finger rub intact. Facial sensation intact to fine [name]. Facial motor strength is symmetric and tongue and uvula move midline. Shoulder shrug was symmetrical.   The patient's illness is still not diagnosed , not categorized, and she just has seen [name] [name] , who dismissed the patient.   -I spent more than 25  minutes of face to face time with the patient. Greater than 50% of time was spent in counseling and coordination of care. We have discussed the diagnosis and differential and I answered the patient's questions.  There is an overlying concern of pain and subjective nodules all over the body.  Visible is for my that the skin on abdomen and hips  is dimpled and tight, that her skin is indurated.     Assessment:  After physical and neurologic examination, review of laboratory studies,  Personal review of imaging studies, reports of other /same  Imaging studies ,  Results of polysomnography/ neurophysiology testing and pre-existing records as far as provided in visit.,   1)  MicheleHindle's  sleep concerns are related to subjective insomnia, based on a distorted sleep perception.  May be helped with progesterone.  Got dizzy on Cymbalta.  2)  Pain, pain, pain. Weakness in upper extremities, neckand Tightness now at neck and chest.  "Short winded " with minimal activity. She has been tolerating Flexeril at night very well. I would consider changing her to amitriptyline. She has no cardiac conduct activity issues, she has allergy and exercise-induced asthma. This helps muscle spasms and tension.   3) she has not gone to [facility]. Continues activities and calorie  restriction. She pursues a plant based diet.   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   Plan:     I encourage resuming HRT- low dose estradiol and progesterone at night.  Have reviewed her OD exam at [facility] eye care , she is having excellent vision with correction, just dry eyes, no scotoma, no diplopia.  Keep hydrating and watch electrolyte intake- copper, selenium and trace elements.   I have repeatedly  failed to connect with [name]. Gae Bon, Loletha Carrow , [place]. I regret that I have no answer for [name]. Alanis.     Asencion Partridge Shanee Batch MD  12/07/2016   CC: [name] [name], [place],  [id number]

## 2016-12-07 NOTE — Telephone Encounter (Signed)
Pt wants the Dr to know that she's open to trying the medication to help her sleep and is also going to try the plant based diet.

## 2016-12-07 NOTE — Addendum Note (Signed)
Addended by: Larey Seat on: 12/07/2016 03:24 PM   Modules accepted: Orders

## 2016-12-07 NOTE — Telephone Encounter (Signed)
Dr. [name] advised me that she called this pt and asked me to fax the RX for seroquel to [facility] on [address]. I faxed the RX. Received a receipt of confirmation.'

## 2016-12-07 NOTE — Telephone Encounter (Signed)
I continued  her on flexaril, after we spoke abut Seroquel for sleep and neuroleptic therapy.  A plant based diet is recommended for patient's with inflammatory diseases, and she has already been using this diet.  Seroquel will need a PA.- but I find it very helpful in insomnia and patient with anxiety and those chronically worried or anxious.

## 2016-12-07 NOTE — Telephone Encounter (Signed)
Dr. [name] will need to address starting pt on any new medications, none were ordered at her office visit today.

## 2017-01-05 ENCOUNTER — Telehealth: Payer: Self-pay | Admitting: Neurology

## 2017-01-05 NOTE — Telephone Encounter (Signed)
Patient says needs to be seen. Has numbing in her right arm and hand, both feet and right leg x 2 weeks. Please call.

## 2017-01-05 NOTE — Telephone Encounter (Signed)
I called pt. She reports increased weakness and numbness in bilateral legs for the past 2 weeks. Pt says that Dr. [name] told her that she would be "taking the lead" on pt's case and wants to be seen by Dr. [name]. I advised her that Dr. [name] is out of the office the next 2 weeks but there has been a cancellation on 01/24/17 at 8:30am that the pt may have if she would like. Pt declined a visit with the NP. Pt accepted the 01/24/17 appt.  Pt is asking that Dr. [name] call her. Pt has a multitude of questions regarding a possible [facility] appt, her worsening symptoms, etc. Please call her at [phone].

## 2017-01-05 NOTE — Telephone Encounter (Signed)
I spoke to Mrs. [name] [name], feels that both of her legs have become weaker as well as her upper arm strength been reduced. She is considering a tertiary care center and suggested the [facility]. We will discuss the details in her visit and I'm looking forward to see her on 01/24/2017. CD

## 2017-01-24 ENCOUNTER — Ambulatory Visit (INDEPENDENT_AMBULATORY_CARE_PROVIDER_SITE_OTHER): Payer: PRIVATE HEALTH INSURANCE | Admitting: Neurology

## 2017-01-24 ENCOUNTER — Encounter: Payer: Self-pay | Admitting: Neurology

## 2017-01-24 VITALS — BP 122/72 | HR 78 | Ht 65.0 in | Wt 184.0 lb

## 2017-01-24 DIAGNOSIS — R52 Pain, unspecified: Secondary | ICD-10-CM | POA: Diagnosis not present

## 2017-01-24 NOTE — Progress Notes (Signed)
SLEEP MEDICINE CLINIC   Provider:  Larey Seat, M D  Referring Provider: Marton Redwood, MD Primary Care Physician:  Marton Redwood, MD  Chief Complaint  Patient presents with  . Myalgia    She is here for her worsening diffuse body pain and numbness.    HPI:  [name] [name] is a 51 y.o. female , seen here as a referral from Dr. [name] for a new sleep evaluation.  She has been seen in our sleep clinic before,and exactly 7 years ago underwent a sleep study that diagnosed no apnea, no PLM and no organic disease.  Her last sleep study took place on 05/15/2009 and revealed an AHI of 1.1 even in supine there was no accentuation but in rem sleep her AHI was 9.2. She did not have oxygen desaturations at the time and snoring was noted as being mild. Heart rate varied in normal range and remained in sinus rhythm. We discussed at the time to change her bedtime to an earlier time of day and to try Flexeril as helpful for muscle relaxation and sleep induction at a low dose nightly. That was helpful for years, but not any longer. Mrs Dinunzio's husband, an ED Physician, has not noted changes in her sleep, but he is de facto a shift Insurance underwriter.  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 These painful nodes are manifesting like cellulite on extremities and abdomen, and she feels misunderstood.  She is postmenopausal , enrolled into the [facility] in Spring 2016 , lost 40 pounds. Now en plateau. She still gained in girth while losing weight ! One possible explanation is that subcutaneous lipomata can be stimulated by lactic acid, therfore by exercise. She has incontinence problem.  She relates her loss of sleep to her pain all over her body. She already underwent an MRI of the brain and the femur to look at the possibility of having developed lipomata for a central nervous system disease that could correlate  to these pains. The one place she can easily sleep is in the cinema, and in the houses of friends.  07-21-2016 I have  the pleasure of seeing [name] [name] today following a sleep study from November 2017, her AHI was 1.2 and there is no apnea going on and she did not have hypoxemia she did not have periodic limb movement arousals, and she had highly efficient sleep was normal sleep architecture including normal REM sleep distribution. Her heart rate remained in normal sinus rhythm. She slept well and feels that she slept well. She had taken maxalt. She had a negative MRI for lipoma deposits, that she suspected she has.  She believes she has fibromyalgia or Dercum's disease- is in contact with Rosanne Gutting, in [place] , [place], Was recommended to do dry brushing.  [name] [name] recommended Dr Chauncey Cruel. Deveshwar, Rheumatology. Dr [name] wrote her referral. paleo diet, "no red food , no dairy ".  Epworth 8 , FSS 16.  Interval history from the first of every 2018, [name] [name]  has insomnia related to pain, feels more more hard nodules affecting her extremities and torso, tender to touch, very frustrating. She was able to lose weight but has not felt that the subcutaneous fatty tissue has really changed.  Not longer tearful.  She went skiing and had one minor fall. 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  She continues to work with Javier Docker, PT.whohas mapped the painful spots. She exercises to keep her muscle strength.  She has begun taking Cymbalta. Referral to Rheumatology, Dr. Estanislado Pandy is not seeing patients for about a month from now on- I would much appreciate if Dr. Trudie Reed or Charlynne Pander would be willing to see her. She also learned that she has borderline hypercholesterolemia but she follows a vegetarian diet, with eggs, little dairy.   Interval history from 10/21/2016, patient has not seen rheumatology at, Dr.  Estanislado Pandy declined, [facility] declined, and apparently neither Dr. Lenna Gilford or Dr. Amil Amen has made appointments for her. She has seen her primary care physician yesterday but feels that she has been placed in a category that does not fit her. She does not believe that this is a psychosomatic expression or related to menopause. She has continued to see physical therapy once a week had helped in her  Myalgia and connective tissue pain- symptoms through the different modality intervention. Continues with therapy under Javier Docker.  She reports subjective right leg heaviness. Having hot flushes and cannot sleep. Hair loss. Dry mouth and dry eyes. Weight gain.  I urged her to resume estradiol and progesterone - These symptoms can be related to menopause. She eats a calorie restricted diet and is very cautious with her food intake, she has a vegetarian but not strictly vegan diet. She still has gained body fat especially at the abdomen and over the triceps. She is very unhappy about this. I offered to send her to medical weight management for second opinion. Dr. Leafy Ro.  Interval history from 01/24/2017 [name] [name] wrote a summary of her symptoms and the development of her illness she reports that in September 2017 she begun having left knee numbness and swelling, she later developed painful lumps around the abdomen and upper arms, the thighs,- both arms feel very heavy and she has a loss of exercise tolerance, getting winded easily.  She reports numbness swelling in her extremities extremely painful swelling in all joints especially at the hands , were it has made it difficult to hold on to an object. She feels that she is still gaining volume in the upper lower extremities and skin thickness she feels very heavy very lumpy and painful she feels that she is walking through quicksand. After sitting more than 30 minutes she will have numbing both legs and into the feet. Sometimes she has leg spasms, other times pin  and needle dysesthesias. She has become larger at the abdomen 5 and upper arms in spite of being on a rigid diet is very distressing to her and remains unexplained she continues to do a lot of physical activity and watches her calories. 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     Review of Systems: Out of a complete 14 system review, the patient complains of only the following symptoms, and all other reviewed systems are negative.  Soreness, rigidity, nodes all over the skin.  Feeling as if muscles are "squeezed" and tight,  Pain sensitive, with menopausal symptoms, fibromyalgia ?, whole body achiness.  Tore MCL while skiing.   Social History   Social History  . Marital status: Married  Spouse name: N/A  . Number of children: N/A  . Years of education: N/A   Occupational History  . Not on file.   Social History Main Topics  . Smoking status: Never Smoker  . Smokeless tobacco: Never Used  . Alcohol use Yes     Comment: rarely  . Drug use: No  . Sexual activity: Yes   Other Topics Concern  . Not on file   Social History Narrative  . No narrative on file    Family History  Problem Relation Age of Onset  . Hiatal hernia Mother   . Hypertension Mother   . Depression Mother   . Heart disease Father   . Stroke Maternal Grandfather   . Chronic Renal Failure Paternal Grandmother   . Drug abuse Son     Past Medical History:  Diagnosis Date  . Anemia    many years ago  . Anxiety    situational  . Arthritis    "hands" (07/12/2012)  . Asthma    seasonal   . Chest pain   . Chest pain at rest, on going 07/12/2012  . Family history of early CAD 07/12/2012  . Fibromyalgia   . GERD (gastroesophageal reflux disease)   .  Headache(784.0)    "often; not daily" (07/12/2012)  . Hypertension    not on medications  . Kidney stones   . Migraine   . Pneumonia    as a child  . PONV (postoperative nausea and vomiting)    she states she gets very sick    Past Surgical History:  Procedure Laterality Date  . breast lift    . CARDIAC CATHETERIZATION  07/11/2012  . CHOLECYSTECTOMY N/A 09/18/2015   Procedure: LAPAROSCOPIC CHOLECYSTECTOMY;  Surgeon: Coralie Keens, MD;  Location: [facility];  Service: General;  Laterality: N/A;  . LEFT HEART CATHETERIZATION WITH CORONARY ANGIOGRAM N/A 07/11/2012   Procedure: LEFT HEART CATHETERIZATION WITH CORONARY ANGIOGRAM;  Surgeon: Lorretta Harp, MD;  Location: [facility] CATH LAB;  Service: Cardiovascular;  Laterality: N/A;  . TONSILLECTOMY  ~ 1976  . tubes and ovaries removed  2015  . VAGINAL HYSTERECTOMY  ~ 2009    Current Outpatient Prescriptions  Medication Sig Dispense Refill  . cholecalciferol (VITAMIN D) 1000 units tablet Take 1,000 Units by mouth daily.    . cyclobenzaprine (FLEXERIL) 10 MG tablet Take 10 mg by mouth at bedtime.    Marland Kitchen levocetirizine (XYZAL) 5 MG tablet Take 5 mg by mouth every evening.    . montelukast (SINGULAIR) 10 MG tablet Take 10 mg by mouth at bedtime.    . ondansetron (ZOFRAN-ODT) 4 MG disintegrating tablet Take 4-8 mg by mouth every 8 (eight) hours as needed for nausea or vomiting.    Marland Kitchen PRESCRIPTION MEDICATION 3 (three) times a week. *Allergy Shots*    . rizatriptan (MAXALT-MLT) 10 MG disintegrating tablet Take 1 tablet by mouth every 2 (two) hours as needed for migraine.   11   No current facility-administered medications for this visit.     Allergies as of 01/24/2017 - Review Complete 01/24/2017  Allergen Reaction Noted  . Morphine and related Anaphylaxis and Nausea And Vomiting 09/11/2015  . Erythromycin Other (See Comments) 07/11/2012  . Iohexol Hives, Itching, and Swelling 10/08/2015  . Zithromax [azithromycin] Other (See Comments)  07/11/2012  . Avelox [moxifloxacin hcl in nacl] Swelling 10/08/2015  . Levaquin [levofloxacin in d5w] Swelling 10/08/2015  . Augmentin [amoxicillin-pot clavulanate] Nausea And Vomiting 09/17/2015  . Dilaudid [hydromorphone hcl] Nausea And Vomiting  09/17/2015  . Oxycodone hcl Nausea And Vomiting 09/17/2015  . Tramadol Nausea Only 09/11/2015    Vitals: BP 122/72   Pulse 78   Ht 5\' 5"  (1.651 m)   Wt 184 lb (83.5 kg)   BMI 30.62 kg/m  Last Weight:  Wt Readings from Last 1 Encounters:  01/24/17 184 lb (83.5 kg)   XBJ:YNWG mass index is 30.62 kg/m.     Last Height:   Ht Readings from Last 1 Encounters:  01/24/17 5\' 5"  (1.651 m)    Physical exam:  General: The patient is awake, alert and appears not in acute distress. The patient is well groomed. Head: Normocephalic, atraumatic. Neck is supple. Mallampati 3,  neck circumference: 13.5 !  . Nasal airflow congested , macroglossia borderline . Retrognathia. Cardiovascular:  Regular rate and rhythm , without  murmurs or carotid bruit, and without distended neck veins. Respiratory: Lungs are clear to auscultation. Skin:  Without evidence of pitting edema, or rash. Dimpled skin on abdomen and hips.  .  Trunk: BMI is elevated. The patient's posture is erect   Neurologic exam : The patient is awake and alert, oriented to place and time.  Memory subjective described as intact. Attention span & concentration ability appears normal.  Speech is fluent,  without dysarthria, dysphonia or aphasia. Mood and affect are agitated and worried.   Cranial nerves: Pupils are equal and briskly reactive to light. Extraocular movements  in vertical and horizontal planes intact and without nystagmus. Visual fields by finger perimetry are intact.Hearing to finger rub intact. Facial sensation intact to fine touch. Facial motor strength is symmetric and tongue and uvula move midline. Shoulder shrug was symmetrical.    The patient's illness is still not  categorized, and she  was dismissed by rheumatology.  .   -I spent more than 25  minutes of face to face time with the patient. Greater than 50% of time was spent in counseling and coordination of care. We have discussed the diagnosis and differential and I answered the patient's questions.  There is an overlying concern of pain and subjective nodules all over the body.  Visible is for my that the skin on abdomen and hips  is dimpled and tight, that her skin is indurated.   She wants to see soemone at [facility] clinic.   Assessment:  After physical and neurologic examination, review of laboratory studies,  Personal review of imaging studies, reports of other /same  Imaging studies ,  Results of polysomnography/ neurophysiology testing and pre-existing records as far as provided in visit.,   1)  MicheleRezabek's  sleep concerns are related to subjective insomnia, based on a distorted sleep perception. She has menopausal symptoms.  May be helped with progesterone.  Got dizzy on Cymbalta.  2)  Pain, pain, pain. Weakness in upper extremities, neck and reported tightness now at neck and chest.  "Short winded " with minimal activity.  She has been tolerating Flexeril at night very well. I would consider changing her to amitriptyline.  She has no cardiac conduct activity issues, she has allergy and exercise-induced asthma. This helps muscle spasms and tension.   3) numbness , and reports (subjective) lumps. Continues activities and calorie restriction. No weight loss resulted. She pursues a plant based diet.    Plan:     I encourage resuming HRT- low dose estradiol and progesterone at night. She has hot flushes. No HTN noted- not scleroderma.   Keep hydrating and watch electrolyte intake- copper, selenium and trace  elements.   I have repeatedly  failed to connect with Dr. Gae Bon, [place] Carrow , [place]. I would like her to see the [facility] clinic.  I regret that I have no answer for Mrs. Rhem.     Asencion Partridge Abyan Cadman  MD  01/24/2017   CC: Marton Redwood, [address], [place] [id number]

## 2017-03-07 ENCOUNTER — Telehealth: Payer: Self-pay | Admitting: Neurology

## 2017-03-07 NOTE — Telephone Encounter (Signed)
Pt called the office requested to speak with Dr Dohmeier only. She said it is concerning the [facility] clinic and new symptoms. Said she is loosing function of hands and feet and has another growth. Pt has an appt on 8/13 but wants to speak with provider only.

## 2017-03-08 NOTE — Telephone Encounter (Signed)
The mail box is full and cannot accept any messeges at this time.

## 2017-03-09 ENCOUNTER — Other Ambulatory Visit: Payer: Self-pay | Admitting: Internal Medicine

## 2017-03-09 DIAGNOSIS — E65 Localized adiposity: Secondary | ICD-10-CM

## 2017-03-10 ENCOUNTER — Other Ambulatory Visit (HOSPITAL_COMMUNITY): Payer: Self-pay | Admitting: Internal Medicine

## 2017-03-10 ENCOUNTER — Other Ambulatory Visit: Payer: Self-pay | Admitting: Internal Medicine

## 2017-03-10 DIAGNOSIS — E65 Localized adiposity: Secondary | ICD-10-CM

## 2017-03-11 ENCOUNTER — Ambulatory Visit (HOSPITAL_COMMUNITY)
Admission: RE | Admit: 2017-03-11 | Discharge: 2017-03-11 | Disposition: A | Discharge status: Home / Self Care | Payer: PRIVATE HEALTH INSURANCE | Source: Ambulatory Visit | Attending: Internal Medicine | Admitting: Internal Medicine

## 2017-03-11 DIAGNOSIS — E65 Localized adiposity: Secondary | ICD-10-CM

## 2017-03-14 ENCOUNTER — Encounter: Payer: Self-pay | Admitting: Neurology

## 2017-03-14 ENCOUNTER — Ambulatory Visit (INDEPENDENT_AMBULATORY_CARE_PROVIDER_SITE_OTHER): Payer: PRIVATE HEALTH INSURANCE | Admitting: Neurology

## 2017-03-14 VITALS — BP 145/82 | HR 78 | Ht 65.0 in | Wt 193.0 lb

## 2017-03-14 DIAGNOSIS — H9191 Unspecified hearing loss, right ear: Secondary | ICD-10-CM | POA: Diagnosis not present

## 2017-03-14 DIAGNOSIS — H538 Other visual disturbances: Secondary | ICD-10-CM

## 2017-03-14 DIAGNOSIS — Z6838 Body mass index (BMI) 38.0-38.9, adult: Secondary | ICD-10-CM | POA: Insufficient documentation

## 2017-03-14 DIAGNOSIS — E669 Obesity, unspecified: Secondary | ICD-10-CM | POA: Diagnosis not present

## 2017-03-14 DIAGNOSIS — R609 Edema, unspecified: Secondary | ICD-10-CM | POA: Insufficient documentation

## 2017-03-14 NOTE — Progress Notes (Signed)
SLEEP MEDICINE CLINIC   Provider:  Larey Seat, M D  Referring Provider: Marton Redwood, MD Primary Care Physician:  Marton Redwood, MD  Chief Complaint  Patient presents with  . Follow-up    pt alone, following up, pt having more loss of strength in extremities, more face fullness and left ear difficuilty hearing and right eye site is affected. neck is getting thicker.. severe pain bilateral hands difficulty with certain digits    HPI:  [name] [name] is a 51 y.o. 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      She has been seen in our sleep clinic before,and exactly 7 years ago underwent a sleep study that diagnosed no apnea, no PLM and no organic disease.  Her last sleep study took place on 05/15/2009 and revealed an AHI of 1.1 even in supine there was no accentuation but in rem sleep her AHI was 9.2. She did not have oxygen desaturations at the time and snoring was noted as being mild. Heart rate varied in normal range and remained in sinus rhythm. We discussed at the time to change her bedtime to an earlier time of day and to try Flexeril as helpful for muscle relaxation and sleep induction at a low dose nightly. That was helpful for years, but not any longer. Mrs Bunyan's husband, an ED Physician, has not noted changes in her sleep, but he is de facto a shift Insurance underwriter.  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 These painful nodes are manifesting like cellulite on extremities and abdomen, and she feels misunderstood.  She is postmenopausal , enrolled into the [facility] in Spring 2016 , lost 40 pounds. Now en plateau. She still gained in girth while losing weight ! One possible explanation is that subcutaneous lipomata can be stimulated by lactic acid, therfore by exercise. She has incontinence problem.  She relates her loss of sleep to her pain all over her body. She  already underwent an MRI of the brain and the femur to look at the possibility of having developed lipomata for a central nervous system disease that could correlate to these pains. The one place she can easily sleep is in the cinema, and in the houses of friends.  07-21-2016 I have  the pleasure of seeing Mrs. Burstein today following a sleep study from November 2017, her AHI was 1.2 and there is no apnea going on and she did not have hypoxemia she did not have periodic limb movement arousals, and she had highly efficient sleep was normal sleep architecture including normal REM sleep distribution. Her heart rate remained in normal sinus rhythm. She slept well and feels that she slept well. She had taken maxalt. She had a negative MRI for lipoma deposits, that she suspected she has.  She believes she has fibromyalgia or Dercum's disease- is in contact with Rosanne Gutting, in [place] , [place], Was recommended to do dry brushing.  Velda Young recommended Dr Chauncey Cruel. Deveshwar, Rheumatology. Dr [name] wrote her referral. paleo diet, "no red food , no dairy ".  Epworth 8 , FSS 16.  Interval history from the first of every 2018, Mrs. Mckillop  has insomnia related to pain, feels more more hard nodules affecting her extremities and torso, tender to touch, very frustrating. She was able to lose weight but has not felt that the subcutaneous fatty tissue has really changed.  Not longer tearful.  She went skiing and had one minor fall. With  any activity now she is concerned that she may generate "too much lactic acid "and create the presumed Dercums disease to progress further.  She continues to work with Javier Docker, PT.whohas mapped the painful spots. She exercises to keep her muscle strength.  She has begun taking Cymbalta. Referral to Rheumatology, Dr. Estanislado Pandy is not seeing patients for about a month from now on- I would much appreciate if Dr. Trudie Reed or Charlynne Pander would be willing to see her. She also learned that she has  borderline hypercholesterolemia but she follows a vegetarian diet, with eggs, little dairy.   Interval history from 10/21/2016, patient has not seen rheumatology at, Dr. Estanislado Pandy declined, [facility] declined, and apparently neither Dr. Lenna Gilford or Dr. Amil Amen has made appointments for her. She has seen her primary care physician yesterday but feels that she has been placed in a category that does not fit her. She does not believe that this is a psychosomatic expression or related to menopause. She has continued to see physical therapy once a week had helped in her  Myalgia and connective tissue pain- symptoms through the different modality intervention. Continues with therapy under Javier Docker.  She reports subjective right leg heaviness. Having hot flushes and cannot sleep. Hair loss. Dry mouth and dry eyes. Weight gain.  I urged her to resume estradiol and progesterone - These symptoms can be related to menopause. She eats a calorie restricted diet and is very cautious with her food intake, she has a vegetarian but not strictly vegan diet. She still has gained body fat especially at the abdomen and over the triceps. She is very unhappy about this. I offered to send her to medical weight management for second opinion. Dr. Leafy Ro.  Interval history from 01/24/2017 Mrs. Kimber wrote a summary of her symptoms and the development of her illness she reports that in September 2017 she begun having left knee numbness and swelling, she later developed painful lumps around the abdomen and upper arms, the thighs,- both arms feel very heavy and she has a loss of exercise tolerance, getting winded easily.  She reports numbness swelling in her extremities extremely painful swelling in all joints especially at the hands , were it has made it difficult to hold on to an object. She feels that she is still gaining volume in the upper lower extremities and skin thickness she feels very heavy very lumpy and painful she feels  that she is walking through quicksand. After sitting more than 30 minutes she will have numbing both legs and into the feet. Sometimes she has leg spasms, other times pin and needle dysesthesias. She has become larger at the abdomen 5 and upper arms in spite of being on a rigid diet is very distressing to her and remains unexplained she continues to do a lot of physical activity and watches her calories. 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  03-14-2017, Awaiting a referral acceptance from [facility].  Most recent lab results through her primary care have shown hypertriglyceridemia and hyperlipidemia, in a patient who eats a mostly vegan diet. She has gained further circumference of the upper arms, upper legs and abdomen and is very frustrated. Her fingers  have also become bigger, her face more coarser. And she reports no decreased hearing in the right ear and blurring of vision at night while driving. Her MRI of October 2017 have been normal without evidence of a demyelinating disease, but I will order a visual evoked potential and a hearing brainstem evoked potential for her now.  Review of Systems: Out of a complete 14 system review, the patient complains of only the following symptoms, and all other reviewed systems are negative.  Soreness, rigidity, nodes all over the skin.  Feeling as if muscles are "squeezed" and tight,  Pain sensitive, with menopausal symptoms, fibromyalgia ?, whole body achiness.  Tore MCL while skiing.   Social History   Social History  . Marital status: Married    Spouse name: N/A  . Number of children: N/A  . Years of education: N/A   Occupational History  . Not on file.   Social History Main Topics  . Smoking status: Never  Smoker  . Smokeless tobacco: Never Used  . Alcohol use Yes     Comment: rarely  . Drug use: No  . Sexual activity: Yes   Other Topics Concern  . Not on file   Social History Narrative  . No narrative on file    Family History  Problem Relation Age of Onset  . Hiatal hernia Mother   . Hypertension Mother   . Depression Mother   . Heart disease Father   . Stroke Maternal Grandfather   . Chronic Renal Failure Paternal Grandmother   . Drug abuse Son     Past Medical History:  Diagnosis Date  . Anemia    many years ago  . Anxiety    situational  . Arthritis    "hands" (07/12/2012)  . Asthma    seasonal   . Chest pain   . Chest pain at rest, on going 07/12/2012  . Family history of early CAD 07/12/2012  . Fibromyalgia   . GERD (gastroesophageal reflux disease)   . Headache(784.0)    "often; not daily" (07/12/2012)  . Hypertension    not on medications  . Kidney stones   . Migraine   . Pneumonia    as a child  . PONV (postoperative nausea and vomiting)    she states she gets very sick    Past Surgical History:  Procedure Laterality Date  . breast lift    . CARDIAC CATHETERIZATION  07/11/2012  . CHOLECYSTECTOMY N/A 09/18/2015   Procedure: LAPAROSCOPIC CHOLECYSTECTOMY;  Surgeon: Coralie Keens, MD;  Location: [facility];  Service: General;  Laterality: N/A;  . LEFT HEART CATHETERIZATION WITH CORONARY ANGIOGRAM N/A 07/11/2012   Procedure: LEFT HEART CATHETERIZATION WITH CORONARY ANGIOGRAM;  Surgeon: Lorretta Harp, MD;  Location: [facility] CATH LAB;  Service: Cardiovascular;  Laterality: N/A;  . TONSILLECTOMY  ~ 1976  . tubes and ovaries removed  2015  . VAGINAL HYSTERECTOMY  ~ 2009    Current Outpatient Prescriptions  Medication Sig Dispense Refill  . cholecalciferol (VITAMIN D) 1000 units tablet Take 1,000 Units by mouth daily.    . cyclobenzaprine (FLEXERIL) 10 MG tablet Take 10 mg by mouth at bedtime.    Marland Kitchen levocetirizine (XYZAL) 5 MG tablet Take 5 mg by mouth  every evening.    . montelukast (SINGULAIR) 10 MG tablet Take 10 mg by mouth at bedtime.    . ondansetron (ZOFRAN-ODT) 4 MG disintegrating tablet Take 4-8 mg by mouth every 8 (eight) hours as needed for nausea or vomiting.    Marland Kitchen  PRESCRIPTION MEDICATION 3 (three) times a week. *Allergy Shots*    . rizatriptan (MAXALT-MLT) 10 MG disintegrating tablet Take 1 tablet by mouth every 2 (two) hours as needed for migraine.   11   No current facility-administered medications for this visit.     Allergies as of 03/14/2017 - Review Complete 03/14/2017  Allergen Reaction Noted  . Morphine and related Anaphylaxis and Nausea And Vomiting 09/11/2015  . Erythromycin Other (See Comments) 07/11/2012  . Iohexol Hives, Itching, and Swelling 10/08/2015  . Zithromax [azithromycin] Other (See Comments) 07/11/2012  . Avelox [moxifloxacin hcl in nacl] Swelling 10/08/2015  . Levaquin [levofloxacin in d5w] Swelling 10/08/2015  . Augmentin [amoxicillin-pot clavulanate] Nausea And Vomiting 09/17/2015  . Dilaudid [hydromorphone hcl] Nausea And Vomiting 09/17/2015  . Oxycodone hcl Nausea And Vomiting 09/17/2015  . Tramadol Nausea Only 09/11/2015    Vitals: BP (!) 145/82   Pulse 78   Ht 5\' 5"  (1.651 m)   Wt 193 lb (87.5 kg)   BMI 32.12 kg/m  Last Weight:  Wt Readings from Last 1 Encounters:  03/14/17 193 lb (87.5 kg)   ZDG:LOVF mass index is 32.12 kg/m.     Last Height:   Ht Readings from Last 1 Encounters:  03/14/17 5\' 5"  (1.651 m)    Physical exam:  General: The patient is awake, alert and appears not in acute distress. The patient is well groomed. Head: Normocephalic, atraumatic. Neck is supple. Mallampati 3,  neck circumference: 13.5 !  . Nasal airflow congested , macroglossia borderline . Retrognathia. Cardiovascular:  Regular rate and rhythm , without  murmurs or carotid bruit, and without distended neck veins. Respiratory: Lungs are clear to auscultation. Skin:  Without evidence of pitting edema,  or rash. Dimpled skin on abdomen and hips.  .  Trunk: BMI is elevated. The patient's posture is erect   Neurologic exam : The patient is awake and alert, oriented to place and time.  Memory subjective described as intact. Attention span & concentration ability appears normal.  Speech is fluent,  without dysarthria, dysphonia or aphasia. Mood and affect are agitated and worried.   Cranial nerves: Pupils are equal and briskly reactive to light. Extraocular movements  in vertical and horizontal planes intact and without nystagmus. Visual fields by finger perimetry are intact.Hearing to finger rub intact. Facial sensation intact to fine touch. Facial motor strength is symmetric and tongue and uvula move midline. Shoulder shrug was symmetrical.    The patient's illness is still not categorized, and she  was dismissed by rheumatology.  .   -I spent more than 25  minutes of face to face time with the patient. Greater than 50% of time was spent in counseling and coordination of care. We have discussed the diagnosis and differential and I answered the patient's questions.  There is an overlying concern of pain and subjective nodules all over the body.  Visible is for my that the skin on abdomen and hips  is dimpled and tight, that her skin is indurated.   IMPRESSION: Interval 2.3 x 2.1 x 1.5 cm mass in the left upper buttocks subcutaneous fat. This has an appearance most compatible with developing fat necrosis. Changes due to an interval subcutaneous injection are also possible. A neoplasm is less likely.   Electronically Signed   By: Claudie Revering M.D.   On: 03/11/2017 17:39  She wants to see the [facility].   Assessment:  After physical and neurologic examination, review of laboratory studies,  Personal review of  imaging studies, reports of other /same  Imaging studies ,  Results of polysomnography/ neurophysiology testing and pre-existing records as far as provided in visit.,    0) patient  reports new growth on the left hip- had Ct hip and pelvis - see above . Fat necrosis. Is this a sign of DERCUM'S DISEASE ?   1)  Mrs.Megna's  sleep concerns are related to subjective insomnia, based on a distorted sleep perception. She has menopausal symptoms.  May be helped with progesterone.  Got dizzy on Cymbalta.  2)  Pain, pain, pain. Weakness in upper extremities, neck and reported tightness now at neck and chest.  "Short winded " with minimal activity.  She has been tolerating Flexeril at night very well. I would consider changing her to amitriptyline.  She has no cardiac conduct activity issues, she has allergy and exercise-induced asthma. This helps muscle spasms and tension.   3) numbness , and reports (subjective) lumps. Continues activities and calorie restriction. No weight loss resulted. She pursues a plant based diet.    4) hearing loss to finger rub on the right ear . Sensation loss on top of her shoulders. Numbness in feet.   Plan:     I encourage resuming HRT- low dose estradiol and progesterone at night. She has hot flushes. No HTN noted- not scleroderma.   Keep hydrating and watch electrolyte intake- copper, selenium and trace elements.   I will order an auditory test , a brainstem evoked potential for hearing loss. Reports left vision blurring, at night, while driving. Offered a visual evoked potential. She had a normal MRI in 05-2016 I regret that I have no answer for Mrs. Rinn.     Asencion Partridge Sigmond Patalano MD  03/14/2017   CC: Marton Redwood, [place], [address] [address]

## 2017-03-15 ENCOUNTER — Ambulatory Visit (HOSPITAL_COMMUNITY): Payer: PRIVATE HEALTH INSURANCE

## 2017-03-23 ENCOUNTER — Ambulatory Visit (INDEPENDENT_AMBULATORY_CARE_PROVIDER_SITE_OTHER): Payer: PRIVATE HEALTH INSURANCE | Admitting: Neurology

## 2017-03-23 DIAGNOSIS — H538 Other visual disturbances: Secondary | ICD-10-CM | POA: Diagnosis not present

## 2017-03-23 DIAGNOSIS — R609 Edema, unspecified: Secondary | ICD-10-CM

## 2017-03-23 DIAGNOSIS — Z6838 Body mass index (BMI) 38.0-38.9, adult: Secondary | ICD-10-CM

## 2017-03-23 DIAGNOSIS — E669 Obesity, unspecified: Secondary | ICD-10-CM

## 2017-03-23 DIAGNOSIS — H9191 Unspecified hearing loss, right ear: Secondary | ICD-10-CM

## 2017-03-23 NOTE — Procedures (Signed)
    History:   [name] [name] is a 51 year old patient with a history of blurring of vision, particularly at night while driving. The patient is being evaluated for possible demyelinating disease.  Description: The visual evoked response test was performed today using 32 x 32 check sizes. The absolute latencies for the N1 and the P100 wave forms were within normal limits bilaterally. The amplitudes for the P100 wave forms were also within normal limits bilaterally. The visual acuity was 20/20 OD and 20/20 OS corrected.  Impression:  The visual evoked response test above was within normal limits bilaterally. No evidence of conduction slowing was seen within the anterior visual pathways on either side on today's evaluation.

## 2017-03-24 ENCOUNTER — Ambulatory Visit (INDEPENDENT_AMBULATORY_CARE_PROVIDER_SITE_OTHER): Payer: PRIVATE HEALTH INSURANCE | Admitting: Neurology

## 2017-03-24 DIAGNOSIS — E669 Obesity, unspecified: Secondary | ICD-10-CM

## 2017-03-24 DIAGNOSIS — H9191 Unspecified hearing loss, right ear: Secondary | ICD-10-CM

## 2017-03-24 DIAGNOSIS — H538 Other visual disturbances: Secondary | ICD-10-CM

## 2017-03-24 DIAGNOSIS — R609 Edema, unspecified: Secondary | ICD-10-CM

## 2017-03-24 DIAGNOSIS — Z6838 Body mass index (BMI) 38.0-38.9, adult: Secondary | ICD-10-CM

## 2017-03-24 NOTE — Procedures (Signed)
    History:  [name] [name] is a 51 year old patient with a history of sensation of loss of strength in the arms, difficulty hearing, blurring of vision. The patient is being evaluated for possible demyelinating disease.    Description: The brainstem auditory evoked response test was performed today using 78 dB rarefraction clicks in the ipsilateral ear and 40 dB masking noise in the contralateral ear. The absolute latencies for waveforms I, III, and V were within normal limits bilaterally. The interpeak latencies for waveforms I-III, III-V, and I-V were within normal limits bilaterally. The amplitudes of waveforms I and V were within normal limits bilaterally.  Impression:  The brainstem auditory evoked response test done today was within normal limits bilaterally. No evidence of conduction slowing within the peripheral or central nervous system on either side was seen on today's evaluation.

## 2017-03-24 NOTE — Progress Notes (Signed)
    [facility] - 2 Channel    Protocol / [employer].Stim I III V I-III III-V I-V Amp I Amp V Amp Ratio Threshold   dB ms ms ms ms ms ms V V  dBnHL  R  - Ear Rarefaction  1.1 Ipsilateral 78nHL 1.6 3.6 5.9 2.0 2.3 4.3 0.14 0.90 0.15 +50  1.2 Contralateral   3.7 6.2  2.5   0.57    L  - Ear Rarefaction  1.1 Ipsilateral 70nHL 1.7 3.7 5.7 2.0 2.1 4.0 0.06 0.18 0.34 0  1.2 Contralateral   3.7 5.6  1.9   0.58    2.1              2.2              R-L  - Ear Rarefaction  1.1 Ipsilateral 70nHL -0.1 -0.1 0.2 0.0 0.2 0.3 0.08 0.73 -0.19 0  1.2 Contralateral   0.0 0.6  0.6   -0.01    2.1              2.2

## 2017-03-25 ENCOUNTER — Telehealth: Payer: Self-pay | Admitting: Neurology

## 2017-03-25 ENCOUNTER — Other Ambulatory Visit (HOSPITAL_COMMUNITY): Payer: Self-pay | Admitting: Surgery

## 2017-03-25 DIAGNOSIS — R2242 Localized swelling, mass and lump, left lower limb: Principal | ICD-10-CM

## 2017-03-25 DIAGNOSIS — M7989 Other specified soft tissue disorders: Secondary | ICD-10-CM

## 2017-03-25 NOTE — Telephone Encounter (Signed)
Called to inform the patient that her test that were completed were all within normal limits. Dr Dohmeier didn't have anything extra to add. LVM for pt to call back if she had any further questions.

## 2017-03-25 NOTE — Telephone Encounter (Signed)
-----   Message from Larey Seat, MD sent at 03/24/2017  6:28 PM EDT ----- Normal test result for  Auditory brainstem responses-  No evidence of hearing loss or conduction delay

## 2017-03-30 ENCOUNTER — Other Ambulatory Visit: Payer: Self-pay | Admitting: Student

## 2017-03-30 ENCOUNTER — Telehealth: Payer: Self-pay | Admitting: Radiology

## 2017-03-30 ENCOUNTER — Telehealth (HOSPITAL_COMMUNITY): Payer: Self-pay

## 2017-03-30 NOTE — Telephone Encounter (Signed)
Called to schedule CT biopsy, no answer, vm full. AW

## 2017-03-31 ENCOUNTER — Encounter (HOSPITAL_COMMUNITY): Payer: Self-pay

## 2017-03-31 ENCOUNTER — Ambulatory Visit (HOSPITAL_COMMUNITY)
Admission: RE | Admit: 2017-03-31 | Discharge: 2017-03-31 | Disposition: A | Discharge status: Home / Self Care | Payer: PRIVATE HEALTH INSURANCE | Source: Ambulatory Visit | Attending: Surgery | Admitting: Surgery

## 2017-03-31 ENCOUNTER — Ambulatory Visit (HOSPITAL_COMMUNITY)
Admission: RE | Admit: 2017-03-31 | Discharge: 2017-03-31 | Disposition: A | Discharge status: Home / Self Care | Payer: PRIVATE HEALTH INSURANCE | Source: Ambulatory Visit | Attending: General Surgery | Admitting: General Surgery

## 2017-03-31 DIAGNOSIS — R222 Localized swelling, mass and lump, trunk: Secondary | ICD-10-CM | POA: Diagnosis not present

## 2017-03-31 DIAGNOSIS — M7989 Other specified soft tissue disorders: Secondary | ICD-10-CM

## 2017-03-31 DIAGNOSIS — R2242 Localized swelling, mass and lump, left lower limb: Secondary | ICD-10-CM

## 2017-03-31 LAB — CBC
HCT: 41.2 % (ref 36.0–46.0)
Hemoglobin: 13 g/dL (ref 12.0–15.0)
MCH: 29.8 pg (ref 26.0–34.0)
MCHC: 31.6 g/dL (ref 30.0–36.0)
MCV: 94.5 fL (ref 78.0–100.0)
PLATELETS: 272 10*3/uL (ref 150–400)
RBC: 4.36 MIL/uL (ref 3.87–5.11)
RDW: 13 % (ref 11.5–15.5)
WBC: 7 10*3/uL (ref 4.0–10.5)

## 2017-03-31 LAB — PROTIME-INR
INR: 0.84
Prothrombin Time: 11.4 seconds (ref 11.4–15.2)

## 2017-03-31 LAB — APTT: aPTT: 27 seconds (ref 24–36)

## 2017-03-31 MED ORDER — LIDOCAINE-EPINEPHRINE 1 %-1:100000 IJ SOLN
INTRAMUSCULAR | Status: AC
  Filled 2017-03-31: qty 1

## 2017-03-31 MED ORDER — SODIUM CHLORIDE 0.9 % IV SOLN: INTRAVENOUS | Status: DC

## 2017-03-31 NOTE — Progress Notes (Signed)
Patient ID: [name] [name], female   DOB: [birth year], 51 y.o.   MRN: [id number]    Referring Physician(s): Dr. Armandina Gemma  Supervising Physician: Sandi Mariscal  Patient Status: [facility] - Out-pt  Chief Complaint: Left upper buttock nodule  Subjective: Patient presents today for a biopsy of a lesion on her left upper buttock that appeared about 3 weeks ago.  She has noticed several lesions over her body similar to this.  She had a CT scan that revealed this nodule and was referred to Dr. Harlow Asa for evaluation.  She now presents today for a biopsy of this lesion.  Allergies: Morphine and related; Erythromycin; Iohexol; Zithromax [azithromycin]; Avelox [moxifloxacin hcl in nacl]; Levaquin [levofloxacin in d5w]; Augmentin [amoxicillin-pot clavulanate]; Dilaudid [hydromorphone hcl]; Oxycodone hcl; and Tramadol  Medications: Prior to Admission medications   Medication Sig Start Date End Date Taking? Authorizing Provider  cholecalciferol (VITAMIN D) 1000 units tablet Take 1,000 Units by mouth daily.   Yes [provider]  cyclobenzaprine (FLEXERIL) 10 MG tablet Take 10 mg by mouth at bedtime.   Yes [provider]  levocetirizine (XYZAL) 5 MG tablet Take 5 mg by mouth every evening.   Yes [provider]  montelukast (SINGULAIR) 10 MG tablet Take 10 mg by mouth at bedtime.   Yes [provider]  Multiple Vitamin (MULTIVITAMIN) tablet Take 1 tablet by mouth daily.   Yes [provider]  ondansetron (ZOFRAN-ODT) 4 MG disintegrating tablet Take 4-8 mg by mouth every 8 (eight) hours as needed for nausea or vomiting.   Yes [provider]  PRESCRIPTION MEDICATION 3 (three) times a week. *Allergy Shots*   Yes [provider]  rizatriptan (MAXALT-MLT) 10 MG disintegrating tablet Take 1 tablet by mouth every 2 (two) hours as needed for migraine.  06/18/15  Yes [provider]  Vitamin D, Ergocalciferol, (DRISDOL) 50000 units CAPS  capsule Take 50,000 Units by mouth every 7 (seven) days.   Yes [provider]    Vital Signs: BP (!) 133/97   Pulse 74   Temp 97.6 F (36.4 C) (Oral)   Resp 16   Ht 5\' 5"  (1.651 m)   Wt 183 lb (83 kg)   SpO2 99%   BMI 30.45 kg/m   Physical Exam: Gen: NAD Heart: regular rate and rhythm  Lungs: CTAB Abd: soft, NT, ND, +BS Ext: left upper buttock nodule is palpable and superficial  Imaging: No results found.  Labs:  CBC:  Recent Labs  03/31/17 0604  WBC 7.0  HGB 13.0  HCT 41.2  PLT 272    COAGS:  Recent Labs  03/31/17 0604  INR 0.84  APTT 27    BMP: No results for input(s): NA, K, CL, CO2, GLUCOSE, BUN, CALCIUM, CREATININE, GFRNONAA, GFRAA in the last 8760 hours.  Invalid input(s): CMP  LIVER FUNCTION TESTS: No results for input(s): BILITOT, AST, ALT, ALKPHOS, PROT, ALBUMIN in the last 8760 hours.  Assessment and Plan: 1. Left upper buttock nodule  We will plan for a biopsy today of this nodule in [place].  Her labs and vitals have been reviewed.  She would like to proceed with no sedation which is reasonable.  Risks and benefits discussed with the patient including, but not limited to bleeding, infection, damage to adjacent structures or low yield requiring additional tests. All of the patient's questions were answered, patient is agreeable to proceed. Consent signed and in chart.  Electronically Signed: Henreitta Cea 03/31/2017, 7:57 AM   I spent a total  of 25 Minutes at the the patient's bedside AND on the patient's hospital floor or unit, greater than 50% of which was counseling/coordinating care for left buttock nodule

## 2017-03-31 NOTE — Procedures (Signed)
Pre Procedure Dx: Subcutaneous nodule within the left gluteal tissues.  Post Procedural Dx: Same  Technically successful US guided biopsy of indeterminate subcutaneous nodule within the left gluteal tissues.   EBL: None  No immediate complications.   Ronny Bacon, MD Pager #: [phone]

## 2017-03-31 NOTE — Discharge Instructions (Addendum)
Needle Biopsy, Care After °Refer to this sheet in the next few weeks. These instructions provide you with information about caring for yourself after your procedure. Your health care provider may also give you more specific instructions. Your treatment has been planned according to current medical practices, but problems sometimes occur. Call your health care provider if you have any problems or questions after your procedure. °What can I expect after the procedure? °After your procedure, it is common to have soreness, bruising, or mild pain at the biopsy site. This should go away in a few days. °Follow these instructions at home: °· Rest as directed by your health care provider. °· Take medicines only as directed by your health care provider. °· There are many different ways to close and cover the biopsy site, including stitches (sutures), skin glue, and adhesive strips. Follow your health care provider's instructions about: °? Biopsy site care. °? Bandage (dressing) changes and removal. °? Biopsy site closure removal. °· Check your biopsy site every day for signs of infection. Watch for: °? Redness, swelling, or pain. °? Fluid, blood, or pus. °Contact a health care provider if: °· You have a fever. °· You have redness, swelling, or pain at the biopsy site that lasts longer than a few days. °· You have fluid, blood, or pus coming from the biopsy site. °· You feel nauseous. °· You vomit. °Get help right away if: °· You have shortness of breath. °· You have trouble breathing. °· You have chest pain. °· You feel dizzy or you faint. °· You have bleeding that does not stop with pressure or a bandage. °· You cough up blood. °· You have pain in your abdomen. °This information is not intended to replace advice given to you by your health care provider. Make sure you discuss any questions you have with your health care provider. °Document Released: 12/03/2014 Document Revised: 12/25/2015 Document Reviewed:  07/15/2014 °Elsevier Interactive Patient Education © 2018 [employer] ° °

## 2017-07-14 ENCOUNTER — Ambulatory Visit: Payer: PRIVATE HEALTH INSURANCE | Admitting: Neurology

## 2017-07-15 ENCOUNTER — Other Ambulatory Visit: Payer: Self-pay | Admitting: Internal Medicine

## 2017-07-15 DIAGNOSIS — Z1231 Encounter for screening mammogram for malignant neoplasm of breast: Secondary | ICD-10-CM

## 2017-07-19 ENCOUNTER — Other Ambulatory Visit: Payer: Self-pay | Admitting: Internal Medicine

## 2017-07-19 DIAGNOSIS — N644 Mastodynia: Secondary | ICD-10-CM

## 2017-07-20 ENCOUNTER — Other Ambulatory Visit: Payer: PRIVATE HEALTH INSURANCE

## 2017-07-22 ENCOUNTER — Ambulatory Visit
Admission: RE | Admit: 2017-07-22 | Discharge: 2017-07-22 | Disposition: A | Discharge status: Home / Self Care | Payer: PRIVATE HEALTH INSURANCE | Source: Ambulatory Visit | Attending: Internal Medicine | Admitting: Internal Medicine

## 2017-07-22 DIAGNOSIS — N644 Mastodynia: Secondary | ICD-10-CM

## 2017-08-19 ENCOUNTER — Encounter (HOSPITAL_COMMUNITY): Payer: Self-pay

## 2017-08-19 ENCOUNTER — Emergency Department (HOSPITAL_COMMUNITY): Payer: PRIVATE HEALTH INSURANCE

## 2017-08-19 ENCOUNTER — Emergency Department (HOSPITAL_COMMUNITY)
Admission: EM | Admit: 2017-08-19 | Discharge: 2017-08-20 | Disposition: A | Discharge status: Home / Self Care | Payer: PRIVATE HEALTH INSURANCE | Attending: Emergency Medicine | Admitting: Emergency Medicine

## 2017-08-19 DIAGNOSIS — R112 Nausea with vomiting, unspecified: Secondary | ICD-10-CM | POA: Insufficient documentation

## 2017-08-19 DIAGNOSIS — R197 Diarrhea, unspecified: Secondary | ICD-10-CM | POA: Insufficient documentation

## 2017-08-19 DIAGNOSIS — Z79899 Other long term (current) drug therapy: Secondary | ICD-10-CM | POA: Insufficient documentation

## 2017-08-19 DIAGNOSIS — J45909 Unspecified asthma, uncomplicated: Secondary | ICD-10-CM | POA: Diagnosis not present

## 2017-08-19 DIAGNOSIS — I10 Essential (primary) hypertension: Secondary | ICD-10-CM | POA: Insufficient documentation

## 2017-08-19 DIAGNOSIS — M545 Low back pain, unspecified: Secondary | ICD-10-CM

## 2017-08-19 DIAGNOSIS — R109 Unspecified abdominal pain: Secondary | ICD-10-CM | POA: Diagnosis present

## 2017-08-19 LAB — COMPREHENSIVE METABOLIC PANEL
ALK PHOS: 71 U/L (ref 38–126)
ALT: 32 U/L (ref 14–54)
AST: 28 U/L (ref 15–41)
Albumin: 4.2 g/dL (ref 3.5–5.0)
Anion gap: 10 (ref 5–15)
BILIRUBIN TOTAL: 1.1 mg/dL (ref 0.3–1.2)
BUN: 14 mg/dL (ref 6–20)
CALCIUM: 8.8 mg/dL — AB (ref 8.9–10.3)
CO2: 26 mmol/L (ref 22–32)
Chloride: 105 mmol/L (ref 101–111)
Creatinine, Ser: 0.8 mg/dL (ref 0.44–1.00)
GFR calc Af Amer: >60 mL/min (ref >60)
Glucose, Bld: 98 mg/dL (ref 65–99)
POTASSIUM: 3.7 mmol/L (ref 3.5–5.1)
Sodium: 141 mmol/L (ref 135–145)
TOTAL PROTEIN: 7 g/dL (ref 6.5–8.1)

## 2017-08-19 LAB — CBC WITH DIFFERENTIAL/PLATELET
BASOS PCT: 0 %
Basophils Absolute: 0 10*3/uL (ref 0.0–0.1)
EOS ABS: 0 10*3/uL (ref 0.0–0.7)
EOS PCT: 1 %
HEMATOCRIT: 44.9 % (ref 36.0–46.0)
Hemoglobin: 14.3 g/dL (ref 12.0–15.0)
Lymphocytes Relative: 16 %
Lymphs Abs: 1 10*3/uL (ref 0.7–4.0)
MCH: 30.3 pg (ref 26.0–34.0)
MCHC: 31.8 g/dL (ref 30.0–36.0)
MCV: 95.1 fL (ref 78.0–100.0)
MONOS PCT: 6 %
Monocytes Absolute: 0.4 10*3/uL (ref 0.1–1.0)
NEUTROS ABS: 5 10*3/uL (ref 1.7–7.7)
Neutrophils Relative %: 77 %
PLATELETS: 247 10*3/uL (ref 150–400)
RBC: 4.72 MIL/uL (ref 3.87–5.11)
RDW: 12.7 % (ref 11.5–15.5)
WBC: 6.4 10*3/uL (ref 4.0–10.5)

## 2017-08-19 LAB — INFLUENZA PANEL BY PCR (TYPE A & B)
Influenza A By PCR: NEGATIVE
Influenza B By PCR: NEGATIVE

## 2017-08-19 LAB — URINALYSIS, ROUTINE W REFLEX MICROSCOPIC
BILIRUBIN URINE: NEGATIVE
GLUCOSE, UA: NEGATIVE mg/dL
Hgb urine dipstick: NEGATIVE
KETONES UR: NEGATIVE mg/dL
Leukocytes, UA: NEGATIVE
Nitrite: NEGATIVE
PH: 5 (ref 5.0–8.0)
Protein, ur: NEGATIVE mg/dL
SPECIFIC GRAVITY, URINE: 1.025 (ref 1.005–1.030)

## 2017-08-19 LAB — LIPASE, BLOOD: LIPASE: 24 U/L (ref 11–51)

## 2017-08-19 MED ORDER — DIPHENHYDRAMINE HCL 50 MG/ML IJ SOLN
50.0000 mg | Freq: Once | INTRAMUSCULAR | Status: AC
  Administered 2017-08-19: 50 mg via INTRAVENOUS
  Filled 2017-08-19 (×2): qty 1

## 2017-08-19 MED ORDER — SODIUM CHLORIDE 0.9 % IV BOLUS (SEPSIS)
1000.0000 mL | Freq: Once | INTRAVENOUS | Status: AC
Start: 2017-08-19 — End: 2017-08-19
  Administered 2017-08-19: 1000 mL via INTRAVENOUS

## 2017-08-19 MED ORDER — ACETAMINOPHEN 325 MG PO TABS
650.0000 mg | ORAL_TABLET | Freq: Once | ORAL | Status: AC
Start: 2017-08-19 — End: 2017-08-19
  Administered 2017-08-19: 650 mg via ORAL
  Filled 2017-08-19: qty 2

## 2017-08-19 MED ORDER — KETOROLAC TROMETHAMINE 30 MG/ML IJ SOLN
15.0000 mg | Freq: Once | INTRAMUSCULAR | Status: AC
  Administered 2017-08-19: 15 mg via INTRAVENOUS
  Filled 2017-08-19: qty 1

## 2017-08-19 MED ORDER — METOCLOPRAMIDE HCL 5 MG/ML IJ SOLN
10.0000 mg | Freq: Once | INTRAMUSCULAR | Status: DC
  Filled 2017-08-19 (×2): qty 2

## 2017-08-19 MED ORDER — ONDANSETRON HCL 4 MG/2ML IJ SOLN
4.0000 mg | INTRAMUSCULAR | Status: AC | PRN
  Administered 2017-08-19 (×2): 4 mg via INTRAVENOUS
  Filled 2017-08-19 (×2): qty 2

## 2017-08-19 MED ORDER — DEXAMETHASONE 4 MG PO TABS
4.0000 mg | ORAL_TABLET | Freq: Once | ORAL | Status: DC
  Filled 2017-08-19: qty 1

## 2017-08-19 MED ORDER — ONDANSETRON 4 MG PO TBDP: 4.0000 mg | ORAL_TABLET | Freq: Three times a day (TID) | ORAL | 0 refills | Status: DC | PRN

## 2017-08-19 MED ORDER — ONDANSETRON HCL 4 MG/2ML IJ SOLN
4.0000 mg | INTRAMUSCULAR | Status: DC | PRN
  Administered 2017-08-19: 4 mg via INTRAVENOUS
  Filled 2017-08-19: qty 2

## 2017-08-19 MED ORDER — NAPROXEN 250 MG PO TABS: 250.0000 mg | ORAL_TABLET | Freq: Two times a day (BID) | ORAL | 0 refills | Status: DC | PRN

## 2017-08-19 NOTE — ED Notes (Signed)
Pt given ginger ale at this time.  

## 2017-08-19 NOTE — Discharge Instructions (Signed)
Take the prescriptions as directed.  Increase your fluid intake (ie:  Gatoraide) for the next few days.  Eat a bland diet and advance to your regular diet slowly as you can tolerate it.   Avoid full strength juices, as well as milk and milk products until your diarrhea has resolved.   Call your regular medical doctor Monday to schedule a follow up appointment this week.  Return to the Emergency Department immediately if not improving (or even worsening) despite taking the medicines as prescribed, any black or bloody stool or vomit, if you develop a fever over "101," or for any other concerns.

## 2017-08-19 NOTE — ED Triage Notes (Signed)
Pt reports that she has right flank pain since last night approx 6pm. Last night Sharp pain to right flank area throughout night. Pt reports that she has history of stones. Recently exposed to flu

## 2017-08-19 NOTE — ED Provider Notes (Signed)
[facility] EMERGENCY DEPARTMENT Provider Note   CSN: [id number] Arrival date & time: 08/19/17  1449     History   Chief Complaint Chief Complaint  Patient presents with  . Flank Pain    HPI [name] [name] is a 52 y.o. female.   Flank Pain     Pt was seen at 1500.  Per pt, c/o gradual onset and persistence of constant right sided flank "pain" that began yesterday. Has been associated with multiple intermittent episodes of N/V/D. Describes the pain as "sharp." States she has recently been "exposed to the flu." Denies sore throat, no CP/SOB, no cough, no fevers, no rash, no black or blood in stools or emesis.    Past Medical History:  Diagnosis Date  . Anemia    many years ago  . Anxiety    situational  . Arthritis    "hands" (07/12/2012)  . Asthma    seasonal   . Chest pain   . Chest pain at rest, on going 07/12/2012  . Family history of early CAD 07/12/2012  . Fibromyalgia   . GERD (gastroesophageal reflux disease)   . Headache(784.0)    "often; not daily" (07/12/2012)  . Hypertension    not on medications  . Kidney stones   . Migraine   . Pneumonia    as a child  . PONV (postoperative nausea and vomiting)    she states she gets very sick    Patient Active Problem List   Diagnosis Date Noted  . Lipoedema 03/14/2017  . Blurring of vision 03/14/2017  . Class 2 obesity with body mass index (BMI) of 38.0 to 38.9 in adult 03/14/2017  . Fibromyalgia 11/09/2016  . History of migraine 11/09/2016  . History of environmental allergies 11/09/2016  . Abdominal apron 10/21/2016  . Myalgia 05/13/2016  . Adjustment insomnia 05/13/2016  . Snoring 05/13/2016  . 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 Chest pain at rest, on going, probable GI source 07/12/2012  . HTN (hypertension) 07/12/2012  . Family history of early CAD 07/12/2012  . Anxiety 07/12/2012    Past Surgical History:  Procedure Laterality Date  . breast lift    . CARDIAC CATHETERIZATION  07/11/2012    . CHOLECYSTECTOMY N/A 09/18/2015   Procedure: LAPAROSCOPIC CHOLECYSTECTOMY;  Surgeon: Coralie Keens, MD;  Location: [facility];  Service: General;  Laterality: N/A;  . LEFT HEART CATHETERIZATION WITH CORONARY ANGIOGRAM N/A 07/11/2012   Procedure: LEFT HEART CATHETERIZATION WITH CORONARY ANGIOGRAM;  Surgeon: Lorretta Harp, MD;  Location: [facility] CATH LAB;  Service: Cardiovascular;  Laterality: N/A;  . REDUCTION MAMMAPLASTY Bilateral 10+ years ago  . TONSILLECTOMY  ~ 1976  . tubes and ovaries removed  2015  . VAGINAL HYSTERECTOMY  ~ 2009    OB History    No data available       Home Medications    Prior to Admission medications   Medication Sig Start Date End Date Taking? Authorizing Provider  cholecalciferol (VITAMIN D) 1000 units tablet Take 1,000 Units by mouth daily.    [provider]  cyclobenzaprine (FLEXERIL) 10 MG tablet Take 10 mg by mouth at bedtime.    [provider]  levocetirizine (XYZAL) 5 MG tablet Take 5 mg by mouth every evening.    [provider]  montelukast (SINGULAIR) 10 MG tablet Take 10 mg by mouth at bedtime.    [provider]  Multiple Vitamin (MULTIVITAMIN) tablet Take 1 tablet by mouth daily.    [provider]  ondansetron (ZOFRAN-ODT) 4 MG disintegrating tablet Take 4-8 mg by mouth every 8 (eight) hours as needed for nausea or vomiting.    [provider]  PRESCRIPTION MEDICATION 3 (three) times a week. *Allergy Shots*    [provider]  rizatriptan (MAXALT-MLT) 10 MG disintegrating tablet Take 1 tablet by mouth every 2 (two) hours as needed for migraine.  06/18/15   [provider]  Vitamin D, Ergocalciferol, (DRISDOL) 50000 units CAPS capsule Take 50,000 Units by mouth every 7 (seven) days.    [provider]    Family History Family History  Problem Relation Age of Onset  . Hiatal hernia Mother   . Hypertension Mother   . Depression Mother   . Heart disease Father   .  Stroke Maternal Grandfather   . Chronic Renal Failure Paternal Grandmother   . Drug abuse Son     Social History Social History   Tobacco Use  . Smoking status: Never Smoker  . Smokeless tobacco: Never Used  Substance Use Topics  . Alcohol use: No    Frequency: Never  . Drug use: No     Allergies   Morphine and related; Erythromycin; Iohexol; Zithromax [azithromycin]; Avelox [moxifloxacin hcl in nacl]; Levaquin [levofloxacin in d5w]; Augmentin [amoxicillin-pot clavulanate]; Dilaudid [hydromorphone hcl]; Oxycodone hcl; and Tramadol   Review of Systems Review of Systems  Genitourinary: Positive for flank pain.  ROS: Statement: All systems negative except as marked or noted in the HPI; Constitutional: Negative for fever and chills. ; ; Eyes: Negative for eye pain, redness and discharge. ; ; ENMT: Negative for ear pain, hoarseness, nasal congestion, sinus pressure and sore throat. ; ; Cardiovascular: Negative for chest pain, palpitations, diaphoresis, dyspnea and peripheral edema. ; ; Respiratory: Negative for cough, wheezing and stridor. ; ; Gastrointestinal: +N/V/D. Negative for abdominal pain, blood in stool, hematemesis, jaundice and rectal bleeding. . ; ; Genitourinary: +flank pain. Negative for dysuria and hematuria. ; ; Musculoskeletal: Negative for neck pain. Negative for swelling and trauma.; ; Skin: Negative for pruritus, rash, abrasions, blisters, bruising and skin lesion.; ; Neuro: Negative for headache, lightheadedness and neck stiffness. Negative for weakness, altered level of consciousness, altered mental status, extremity weakness, paresthesias, involuntary movement, seizure and syncope.       Physical Exam Updated Vital Signs BP 114/84 (BP Location: Right Arm)   Pulse 96   Temp 98.3 F (36.8 C) (Oral)   Resp 17   Ht 5\' 5"  (1.651 m)   Wt 81.6 kg (180 lb)   SpO2 99%   BMI 29.95 kg/m   Physical Exam 1505: Physical examination:  Nursing notes reviewed; Vital  signs and O2 SAT reviewed;  Constitutional: Well developed, Well nourished, Well hydrated, In no acute distress; Head:  Normocephalic, atraumatic; Eyes: EOMI, PERRL, No scleral icterus; ENMT: Mouth and pharynx normal, Mucous membranes moist; Neck: Supple, Full range of motion, No lymphadenopathy; Cardiovascular: Regular rate and rhythm, No gallop; Respiratory: Breath sounds clear & equal bilaterally, No wheezes.  Speaking full sentences with ease, Normal respiratory effort/excursion; Chest: Nontender, Movement normal; Abdomen: Soft, Nontender, Nondistended, Normal bowel sounds; Genitourinary: No CVA tenderness.;;  Spine:  No midline CS, TS, LS tenderness. +TTP right lumbar paraspinal muscles..;; Extremities: Pulses normal, No tenderness, No edema, No calf edema or asymmetry.; Neuro: AA&Ox3, Major CN grossly intact.  Speech clear. No gross focal motor or sensory deficits in extremities.; Skin: Color normal, Warm, Dry.   ED Treatments / Results  Labs (all labs ordered are listed, but only  abnormal results are displayed)   EKG  EKG Interpretation None       Radiology   Procedures Procedures (including critical care time)  Medications Ordered in ED Medications  sodium chloride 0.9 % bolus 1,000 mL (not administered)  ondansetron (ZOFRAN) injection 4 mg (not administered)     Initial Impression / Assessment and Plan / ED Course  I have reviewed the triage vital signs and the nursing notes.  Pertinent labs & imaging results that were available during my care of the patient were reviewed by me and considered in my medical decision making (see chart for details).  MDM Reviewed: previous chart, nursing note and vitals Reviewed previous: labs Interpretation: CT scan and labs    Results for orders placed or performed during the hospital encounter of 08/19/17  Urinalysis, Routine w reflex microscopic  Result Value Ref Range   Color, Urine YELLOW YELLOW   APPearance HAZY (A) CLEAR    Specific Gravity, Urine 1.025 1.005 - 1.030   pH 5.0 5.0 - 8.0   Glucose, UA NEGATIVE NEGATIVE mg/dL   Hgb urine dipstick NEGATIVE NEGATIVE   Bilirubin Urine NEGATIVE NEGATIVE   Ketones, ur NEGATIVE NEGATIVE mg/dL   Protein, ur NEGATIVE NEGATIVE mg/dL   Nitrite NEGATIVE NEGATIVE   Leukocytes, UA NEGATIVE NEGATIVE  Comprehensive metabolic panel  Result Value Ref Range   Sodium 141 135 - 145 mmol/L   Potassium 3.7 3.5 - 5.1 mmol/L   Chloride 105 101 - 111 mmol/L   CO2 26 22 - 32 mmol/L   Glucose, Bld 98 65 - 99 mg/dL   BUN 14 6 - 20 mg/dL   Creatinine, Ser 0.80 0.44 - 1.00 mg/dL   Calcium 8.8 (L) 8.9 - 10.3 mg/dL   Total Protein 7.0 6.5 - 8.1 g/dL   Albumin 4.2 3.5 - 5.0 g/dL   AST 28 15 - 41 U/L   ALT 32 14 - 54 U/L   Alkaline Phosphatase 71 38 - 126 U/L   Total Bilirubin 1.1 0.3 - 1.2 mg/dL   GFR calc non Af Amer >60 >60 mL/min   GFR calc Af Amer >60 >60 mL/min   Anion gap 10 5 - 15  Lipase, blood  Result Value Ref Range   Lipase 24 11 - 51 U/L  CBC with Differential  Result Value Ref Range   WBC 6.4 4.0 - 10.5 K/uL   RBC 4.72 3.87 - 5.11 MIL/uL   Hemoglobin 14.3 12.0 - 15.0 g/dL   HCT 44.9 36.0 - 46.0 %   MCV 95.1 78.0 - 100.0 fL   MCH 30.3 26.0 - 34.0 pg   MCHC 31.8 30.0 - 36.0 g/dL   RDW 12.7 11.5 - 15.5 %   Platelets 247 150 - 400 K/uL   Neutrophils Relative % 77 %   Neutro Abs 5.0 1.7 - 7.7 K/uL   Lymphocytes Relative 16 %   Lymphs Abs 1.0 0.7 - 4.0 K/uL   Monocytes Relative 6 %   Monocytes Absolute 0.4 0.1 - 1.0 K/uL   Eosinophils Relative 1 %   Eosinophils Absolute 0.0 0.0 - 0.7 K/uL   Basophils Relative 0 %   Basophils Absolute 0.0 0.0 - 0.1 K/uL  Influenza panel by PCR (type A & B)  Result Value Ref Range   Influenza A By PCR NEGATIVE NEGATIVE   Influenza B By PCR NEGATIVE NEGATIVE   Ct Renal Stone Study Result Date: 08/19/2017 CLINICAL DATA:  RIGHT flank pain. 6 hours of pain. Sharp RIGHT  flank pain. EXAM: CT ABDOMEN AND PELVIS WITHOUT CONTRAST  TECHNIQUE: Multidetector CT imaging of the abdomen and pelvis was performed following the standard protocol without IV contrast. COMPARISON:  CT abdomen 10/08/2015 FINDINGS: Lower chest: Lung bases are clear. Hepatobiliary: No focal hepatic lesion. Postcholecystectomy. No biliary dilatation. Pancreas: Pancreas is normal. No ductal dilatation. No pancreatic inflammation. Spleen: Normal spleen Adrenals/urinary tract: Adrenal glands normal. Nonobstructing calculus in lower pole of the RIGHT kidney measures 2 mm. No ureterolithiasis or obstructive uropathy. The ureters and bladder normal. Stomach/Bowel: Stomach, duodenum small-bowel normal. Terminal ileum normal. Appendix not identified. No secondary signs appendicitis. Ascending, transverse, and descending colon are normal. Rectosigmoid colon normal. Vascular/Lymphatic: Abdominal aorta is normal caliber. There is no retroperitoneal or periportal lymphadenopathy. No pelvic lymphadenopathy. Reproductive: Post hysterectomy.  No adnexal abnormality Other: Mild haziness central mesentery is similar to comparison exams. Musculoskeletal: No aggressive osseous lesion. IMPRESSION: 1. No acute abdominopelvic findings. 2. Small nonobstructing RIGHT renal calculus. 3. No explanation for RIGHT lower quadrant pain. 4. Appendix not identified but no secondary signs of appendicitis. 5. No significant change from comparison exams. Electronically Signed   By: Suzy Bouchard M.D.   On: 08/19/2017 16:51    2145:  Pt has tol PO well while in the ED without N/V.  No stooling while in the ED.  Abd remains benign, VSS. Feels better and wants to go home now.  Requesting tx for her usual migraine headache before d/c; no red flags. Tx symptomatically at this time. Dx and testing d/w pt and family.  Questions answered.  Verb understanding, agreeable to d/c home with outpt f/u.    Final Clinical Impressions(s) / ED Diagnoses   Final diagnoses:  None    ED Discharge Orders    None          Francine Graven, DO 08/21/17 1333

## 2017-08-19 NOTE — ED Notes (Signed)
Was able to drink ginger ale without any vomiting.  Still c.o slight nausea

## 2017-08-29 DIAGNOSIS — M79641 Pain in right hand: Secondary | ICD-10-CM | POA: Insufficient documentation

## 2017-08-29 DIAGNOSIS — M65322 Trigger finger, left index finger: Secondary | ICD-10-CM | POA: Insufficient documentation

## 2017-08-29 DIAGNOSIS — M6530 Trigger finger, unspecified finger: Secondary | ICD-10-CM | POA: Insufficient documentation

## 2017-09-14 ENCOUNTER — Ambulatory Visit: Payer: PRIVATE HEALTH INSURANCE | Admitting: Neurology

## 2018-01-30 ENCOUNTER — Other Ambulatory Visit: Payer: Self-pay

## 2018-01-30 ENCOUNTER — Encounter (HOSPITAL_COMMUNITY): Payer: Self-pay | Admitting: Obstetrics and Gynecology

## 2018-01-30 ENCOUNTER — Emergency Department (HOSPITAL_COMMUNITY)
Admission: EM | Admit: 2018-01-30 | Discharge: 2018-01-30 | Disposition: A | Discharge status: Home / Self Care | Payer: PRIVATE HEALTH INSURANCE | Attending: Emergency Medicine | Admitting: Emergency Medicine

## 2018-01-30 DIAGNOSIS — T7840XA Allergy, unspecified, initial encounter: Secondary | ICD-10-CM | POA: Insufficient documentation

## 2018-01-30 DIAGNOSIS — I10 Essential (primary) hypertension: Secondary | ICD-10-CM | POA: Insufficient documentation

## 2018-01-30 DIAGNOSIS — Z79899 Other long term (current) drug therapy: Secondary | ICD-10-CM | POA: Diagnosis not present

## 2018-01-30 DIAGNOSIS — J45909 Unspecified asthma, uncomplicated: Secondary | ICD-10-CM | POA: Insufficient documentation

## 2018-01-30 MED ORDER — EPINEPHRINE 0.3 MG/0.3ML IJ SOAJ
0.3000 mg | INTRAMUSCULAR | Status: AC
Start: 2018-01-30 — End: 2018-01-30
  Administered 2018-01-30: 0.3 mg via INTRAMUSCULAR
  Filled 2018-01-30: qty 0.3

## 2018-01-30 MED ORDER — FAMOTIDINE IN NACL 20-0.9 MG/50ML-% IV SOLN
20.0000 mg | Freq: Once | INTRAVENOUS | Status: DC
  Filled 2018-01-30: qty 50

## 2018-01-30 MED ORDER — DIPHENHYDRAMINE HCL 50 MG/ML IJ SOLN
25.0000 mg | Freq: Once | INTRAMUSCULAR | Status: AC
  Administered 2018-01-30: 25 mg via INTRAVENOUS
  Filled 2018-01-30: qty 1

## 2018-01-30 MED ORDER — EPINEPHRINE 0.3 MG/0.3ML IJ SOAJ: 0.3000 mg | INTRAMUSCULAR | 1 refills | Status: DC | PRN

## 2018-01-30 NOTE — ED Notes (Signed)
Bed: PJ12 Expected date:  Expected time:  Means of arrival:  Comments: Hold for incoming patient

## 2018-01-30 NOTE — ED Triage Notes (Signed)
Pt reports she was stung on her toe and is having an allergic reaction. Pt reports she called to try to get an epi pen but was unable to get a prescription. Pt reports she is feeling a little better now, but still a little short of breath and some mild facial swelling and tingling.

## 2018-01-30 NOTE — ED Provider Notes (Signed)
[facility] DEPT Provider Note   CSN: [id number] Arrival date & time: 01/30/18  1926     History   Chief Complaint Chief Complaint  Patient presents with  . Allergic Reaction    HPI [name] [name] is a 52 y.o. female.  HPI Patient presents with concern of throat swelling, chest tightness, generalized discomfort. Patient has multiple medical issues, including recently worsening propensity for allergic reaction. Today, about 4 hours prior to ED arrival, but about 3 hours prior to developing after mentioned symptoms she had a yellowjacket sting on her left distal foot. She had pain, swelling, discomfort, and some generalized discomfort, but was improving generally well after taking H2 blocker and resting. However, without clear precipitant, beyond walking, she developed chest tightness, throat swelling, generalized discomfort, and came for evaluation. She notes that during prior allergic reactions, she is developed worsening symptoms with steroids. She is currently being evaluated for an endocrinologic dysfunction.  Past Medical History:  Diagnosis Date  . Anemia    many years ago  . Anxiety    situational  . Arthritis    "hands" (07/12/2012)  . Asthma    seasonal   . Chest pain   . Chest pain at rest, on going 07/12/2012  . Family history of early CAD 07/12/2012  . Fibromyalgia   . GERD (gastroesophageal reflux disease)   . Headache(784.0)    "often; not daily" (07/12/2012)  . Hypertension    not on medications  . Kidney stones   . Migraine   . Pneumonia    as a child  . PONV (postoperative nausea and vomiting)    she states she gets very sick    Patient Active Problem List   Diagnosis Date Noted  . Lipoedema 03/14/2017  . Blurring of vision 03/14/2017  . Class 2 obesity with body mass index (BMI) of 38.0 to 38.9 in adult 03/14/2017  . Fibromyalgia 11/09/2016  . History of migraine 11/09/2016  . History of environmental  allergies 11/09/2016  . Abdominal apron 10/21/2016  . Myalgia 05/13/2016  . Adjustment insomnia 05/13/2016  . Snoring 05/13/2016  . 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 Chest pain at rest, on going, probable GI source 07/12/2012  . HTN (hypertension) 07/12/2012  . Family history of early CAD 07/12/2012  . Anxiety 07/12/2012    Past Surgical History:  Procedure Laterality Date  . breast lift    . CARDIAC CATHETERIZATION  07/11/2012  . CHOLECYSTECTOMY N/A 09/18/2015   Procedure: LAPAROSCOPIC CHOLECYSTECTOMY;  Surgeon: Coralie Keens, MD;  Location: [facility];  Service: General;  Laterality: N/A;  . LEFT HEART CATHETERIZATION WITH CORONARY ANGIOGRAM N/A 07/11/2012   Procedure: LEFT HEART CATHETERIZATION WITH CORONARY ANGIOGRAM;  Surgeon: Lorretta Harp, MD;  Location: [facility] CATH LAB;  Service: Cardiovascular;  Laterality: N/A;  . REDUCTION MAMMAPLASTY Bilateral 10+ years ago  . TONSILLECTOMY  ~ 1976  . tubes and ovaries removed  2015  . VAGINAL HYSTERECTOMY  ~ 2009     OB History   None      Home Medications    Prior to Admission medications   Medication Sig Start Date End Date Taking? Authorizing Provider  cyclobenzaprine (FLEXERIL) 10 MG tablet Take 10 mg by mouth at bedtime.    [provider]  EPINEPHrine (EPIPEN 2-PAK) 0.3 mg/0.3 mL IJ SOAJ injection Inject 0.3 mLs (0.3 mg total) into the muscle as needed. 01/30/18   Carmin Muskrat, MD  naproxen (NAPROSYN) 250 MG tablet Take 1 tablet (250 mg  total) by mouth 2 (two) times daily as needed for mild pain or moderate pain (take with food). 08/19/17   Francine Graven, DO  ondansetron (ZOFRAN ODT) 4 MG disintegrating tablet Take 1 tablet (4 mg total) by mouth every 8 (eight) hours as needed for nausea or vomiting. 08/19/17   Francine Graven, DO  rizatriptan (MAXALT-MLT) 10 MG disintegrating tablet Take 1 tablet by mouth every 2 (two) hours as needed for migraine.  06/18/15   [provider]  Vitamin D,  Ergocalciferol, (DRISDOL) 50000 units CAPS capsule Take 50,000 Units by mouth every 7 (seven) days.    [provider]    Family History Family History  Problem Relation Age of Onset  . Hiatal hernia Mother   . Hypertension Mother   . Depression Mother   . Heart disease Father   . Stroke Maternal Grandfather   . Chronic Renal Failure Paternal Grandmother   . Drug abuse Son     Social History Social History   Tobacco Use  . Smoking status: Never Smoker  . Smokeless tobacco: Never Used  Substance Use Topics  . Alcohol use: No    Frequency: Never  . Drug use: No     Allergies   Morphine and related; Erythromycin; Iohexol; Zithromax [azithromycin]; Avelox [moxifloxacin hcl in nacl]; Levaquin [levofloxacin in d5w]; Augmentin [amoxicillin-pot clavulanate]; Dilaudid [hydromorphone hcl]; Oxycodone hcl; and Tramadol   Review of Systems Review of Systems  Constitutional:       Per HPI, otherwise negative  HENT:       Per HPI, otherwise negative  Respiratory:       Per HPI, otherwise negative  Cardiovascular:       Per HPI, otherwise negative  Gastrointestinal: Negative for vomiting.  Endocrine:       Negative aside from HPI  Genitourinary:       Neg aside from HPI   Musculoskeletal:       Per HPI, otherwise negative  Skin: Positive for wound.  Neurological: Negative for syncope.     Physical Exam Updated Vital Signs BP (!) 146/106   Pulse 99   Temp 98.4 F (36.9 C) (Oral)   SpO2 99%   Physical Exam  Constitutional: She is oriented to person, place, and time. She appears well-developed and well-nourished.  Anxious female awake and alert  HENT:  Head: Normocephalic and atraumatic.  Eyes: Conjunctivae and EOM are normal.  Cardiovascular: Normal rate and regular rhythm.  Pulmonary/Chest: Effort normal and breath sounds normal. No stridor. No respiratory distress.  Abdominal: She exhibits no distension.  Musculoskeletal: She exhibits no edema.    Neurological: She is alert and oriented to person, place, and time. No cranial nerve deficit.  Skin: Skin is warm and dry.  Distal left foot with mild lesion visible, no surrounding erythema, no visible foreign body  Psychiatric: She has a normal mood and affect.  Nursing note and vitals reviewed.    ED Treatments / Results  Labs (all labs ordered are listed, but only abnormal results are displayed) Labs Reviewed - No data to display  EKG EKG Interpretation  Date/Time:  Monday January 30 2018 21:14:06 EDT Ventricular Rate:  84 PR Interval:    QRS Duration: 85 QT Interval:  379 QTC Calculation: 448 R Axis:   57 Text Interpretation:  Sinus rhythm unremarkable ECG Confirmed by Carmin Muskrat [phone]) on 01/30/2018 9:24:13 PM   Radiology No results found.  Procedures Procedures (including critical care time)  Medications Ordered in ED Medications  famotidine (PEPCID) IVPB 20 mg premix (has no administration in time range)  EPINEPHrine (EPI-PEN) injection 0.3 mg (0.3 mg Intramuscular Given 01/30/18 2043)  diphenhydrAMINE (BENADRYL) injection 25 mg (25 mg Intravenous Given 01/30/18 2017)     Initial Impression / Assessment and Plan / ED Course  I have reviewed the triage vital signs and the nursing notes.  Pertinent labs & imaging results that were available during my care of the patient were reviewed by me and considered in my medical decision making (see chart for details).     9:30 PM Patient appears better, states that she feels better, has had improvement in her symptoms. Patient remains hemodynamically unremarkable, EKG is unremarkable, and after provision of epinephrine, Benadryl, and her self-administered H2 blocker, has improved substantially. No evidence for worsening allergic reaction, and after initial treatment, she is appropriate for discharge with close outpatient follow-up.  Final Clinical Impressions(s) / ED Diagnoses   Final diagnoses:  Allergic reaction,  initial encounter    ED Discharge Orders        Ordered    EPINEPHrine (EPIPEN 2-PAK) 0.3 mg/0.3 mL IJ SOAJ injection  As needed     01/30/18 2126       Carmin Muskrat, MD 01/30/18 2131

## 2018-01-30 NOTE — Discharge Instructions (Addendum)
As discussed, your evaluation today has been largely reassuring.  But, it is important that you monitor your condition carefully, and do not hesitate to return to the ED if you develop new, or concerning changes in your condition. ? ?Otherwise, please follow-up with your physician for appropriate ongoing care. ? ?

## 2018-02-24 ENCOUNTER — Emergency Department (HOSPITAL_COMMUNITY)
Admission: EM | Admit: 2018-02-24 | Discharge: 2018-02-24 | Disposition: A | Discharge status: Home / Self Care | Payer: PRIVATE HEALTH INSURANCE | Attending: Emergency Medicine | Admitting: Emergency Medicine

## 2018-02-24 ENCOUNTER — Encounter (HOSPITAL_COMMUNITY): Payer: Self-pay

## 2018-02-24 ENCOUNTER — Emergency Department (HOSPITAL_COMMUNITY): Payer: PRIVATE HEALTH INSURANCE

## 2018-02-24 ENCOUNTER — Other Ambulatory Visit: Payer: Self-pay

## 2018-02-24 DIAGNOSIS — R072 Precordial pain: Secondary | ICD-10-CM | POA: Diagnosis not present

## 2018-02-24 DIAGNOSIS — I10 Essential (primary) hypertension: Secondary | ICD-10-CM | POA: Insufficient documentation

## 2018-02-24 DIAGNOSIS — J45909 Unspecified asthma, uncomplicated: Secondary | ICD-10-CM | POA: Diagnosis not present

## 2018-02-24 DIAGNOSIS — Z79899 Other long term (current) drug therapy: Secondary | ICD-10-CM | POA: Insufficient documentation

## 2018-02-24 LAB — BASIC METABOLIC PANEL
ANION GAP: 12 (ref 5–15)
BUN: 11 mg/dL (ref 6–20)
CHLORIDE: 107 mmol/L (ref 98–111)
CO2: 24 mmol/L (ref 22–32)
Calcium: 9.1 mg/dL (ref 8.9–10.3)
Creatinine, Ser: 0.96 mg/dL (ref 0.44–1.00)
GFR calc Af Amer: >60 mL/min (ref >60)
GFR calc non Af Amer: >60 mL/min (ref >60)
GLUCOSE: 118 mg/dL — AB (ref 70–99)
POTASSIUM: 3.8 mmol/L (ref 3.5–5.1)
Sodium: 143 mmol/L (ref 135–145)

## 2018-02-24 LAB — CBC
HEMATOCRIT: 43.3 % (ref 36.0–46.0)
HEMOGLOBIN: 13.7 g/dL (ref 12.0–15.0)
MCH: 30.4 pg (ref 26.0–34.0)
MCHC: 31.6 g/dL (ref 30.0–36.0)
MCV: 96.2 fL (ref 78.0–100.0)
PLATELETS: 285 10*3/uL (ref 150–400)
RBC: 4.5 MIL/uL (ref 3.87–5.11)
RDW: 12.8 % (ref 11.5–15.5)
WBC: 7.5 10*3/uL (ref 4.0–10.5)

## 2018-02-24 LAB — D-DIMER, QUANTITATIVE: D-Dimer, Quant: <0.27 ug/mL-FEU (ref 0.00–0.50)

## 2018-02-24 LAB — I-STAT TROPONIN, ED: Troponin i, poc: 0 ng/mL (ref 0.00–0.08)

## 2018-02-24 MED ORDER — FAMOTIDINE 20 MG PO TABS
20.0000 mg | ORAL_TABLET | Freq: Once | ORAL | Status: AC
Start: 2018-02-24 — End: 2018-02-24
  Administered 2018-02-24: 20 mg via ORAL
  Filled 2018-02-24: qty 1

## 2018-02-24 MED ORDER — ACETAMINOPHEN 500 MG PO TABS
1000.0000 mg | ORAL_TABLET | Freq: Once | ORAL | Status: AC
  Administered 2018-02-24: 1000 mg via ORAL
  Filled 2018-02-24: qty 2

## 2018-02-24 MED ORDER — GI COCKTAIL ~~LOC~~
30.0000 mL | Freq: Once | ORAL | Status: AC
  Administered 2018-02-24: 30 mL via ORAL
  Filled 2018-02-24: qty 30

## 2018-02-24 NOTE — Discharge Instructions (Signed)
It was our pleasure to provide your ER care today - we hope that you feel better.  Continue your acid blocker medication. If gi symptoms, you may also try pepcid or maalox for symptom relief.  For chest discomfort, follow up with your doctor/cardiologist in the next 1-2 weeks - call office Monday to arrange appointment.   Return to ER if worse, new symptoms, high fevers, increased trouble breathing, other concern.

## 2018-02-24 NOTE — ED Notes (Signed)
Patient transported to X-ray 

## 2018-02-24 NOTE — ED Triage Notes (Signed)
Pt endorses generalized chest pressure radiating up to the left neck and throat x 1 week intermittently. More frequent over the last 24 hours. Endorses shob, lightheadedness, diaphoresis, and nausea. VSS

## 2018-02-24 NOTE — ED Notes (Signed)
Pt departed in NAD, refused use of wheelchair.  

## 2018-02-24 NOTE — ED Provider Notes (Signed)
[place] EMERGENCY DEPARTMENT Provider Note   CSN: [id number] Arrival date & time: 02/24/18  2011     History   Chief Complaint Chief Complaint  Patient presents with  . Chest Pain    HPI [name] [name] is a 52 y.o. female.  Patient c/o mid to left chest pain in the past week, recurrent/persistent episodes, more constant today. Generally occurs at rest. Not related to exertion. Mild sob. No vomiting. No diaphoresis. Occasionally pleuritic in nature. No hx cad, although references family hx cad. No hx dvt or pe. No leg pain or swelling. Denies cough or uri symptoms. No fever or chills. Recent reflux symptoms, started taking aciphex. No chest wall injury or strain.   The history is provided by the patient.  Chest Pain   Pertinent negatives include no abdominal pain, no back pain, no cough, no fever, no headaches and no shortness of breath.    Past Medical History:  Diagnosis Date  . Anemia    many years ago  . Anxiety    situational  . Arthritis    "hands" (07/12/2012)  . Asthma    seasonal   . Chest pain   . Chest pain at rest, on going 07/12/2012  . Family history of early CAD 07/12/2012  . Fibromyalgia   . GERD (gastroesophageal reflux disease)   . Headache(784.0)    "often; not daily" (07/12/2012)  . Hypertension    not on medications  . Kidney stones   . Migraine   . Pneumonia    as a child  . PONV (postoperative nausea and vomiting)    she states she gets very sick    Patient Active Problem List   Diagnosis Date Noted  . Lipoedema 03/14/2017  . Blurring of vision 03/14/2017  . Class 2 obesity with body mass index (BMI) of 38.0 to 38.9 in adult 03/14/2017  . Fibromyalgia 11/09/2016  . History of migraine 11/09/2016  . History of environmental allergies 11/09/2016  . Abdominal apron 10/21/2016  . Myalgia 05/13/2016  . Adjustment insomnia 05/13/2016  . Snoring 05/13/2016  . 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 Chest pain at rest,  on going, probable GI source 07/12/2012  . HTN (hypertension) 07/12/2012  . Family history of early CAD 07/12/2012  . Anxiety 07/12/2012    Past Surgical History:  Procedure Laterality Date  . breast lift    . CARDIAC CATHETERIZATION  07/11/2012  . CHOLECYSTECTOMY N/A 09/18/2015   Procedure: LAPAROSCOPIC CHOLECYSTECTOMY;  Surgeon: Coralie Keens, MD;  Location: [facility];  Service: General;  Laterality: N/A;  . LEFT HEART CATHETERIZATION WITH CORONARY ANGIOGRAM N/A 07/11/2012   Procedure: LEFT HEART CATHETERIZATION WITH CORONARY ANGIOGRAM;  Surgeon: Lorretta Harp, MD;  Location: [facility] CATH LAB;  Service: Cardiovascular;  Laterality: N/A;  . REDUCTION MAMMAPLASTY Bilateral 10+ years ago  . TONSILLECTOMY  ~ 1976  . tubes and ovaries removed  2015  . VAGINAL HYSTERECTOMY  ~ 2009     OB History   None      Home Medications    Prior to Admission medications   Medication Sig Start Date End Date Taking? Authorizing Provider  cyclobenzaprine (FLEXERIL) 10 MG tablet Take 10 mg by mouth at bedtime.    [provider]  EPINEPHrine (EPIPEN 2-PAK) 0.3 mg/0.3 mL IJ SOAJ injection Inject 0.3 mLs (0.3 mg total) into the muscle as needed. 01/30/18   Carmin Muskrat, MD  naproxen (NAPROSYN) 250 MG tablet Take 1 tablet (250 mg total) by mouth  2 (two) times daily as needed for mild pain or moderate pain (take with food). 08/19/17   Francine Graven, DO  ondansetron (ZOFRAN ODT) 4 MG disintegrating tablet Take 1 tablet (4 mg total) by mouth every 8 (eight) hours as needed for nausea or vomiting. 08/19/17   Francine Graven, DO  rizatriptan (MAXALT-MLT) 10 MG disintegrating tablet Take 1 tablet by mouth every 2 (two) hours as needed for migraine.  06/18/15   [provider]  Vitamin D, Ergocalciferol, (DRISDOL) 50000 units CAPS capsule Take 50,000 Units by mouth every 7 (seven) days.    [provider]    Family History Family History  Problem Relation Age of Onset  . Hiatal  hernia Mother   . Hypertension Mother   . Depression Mother   . Heart disease Father   . Stroke Maternal Grandfather   . Chronic Renal Failure Paternal Grandmother   . Drug abuse Son     Social History Social History   Tobacco Use  . Smoking status: Never Smoker  . Smokeless tobacco: Never Used  Substance Use Topics  . Alcohol use: No    Frequency: Never  . Drug use: No     Allergies   Morphine and related; Erythromycin; Iohexol; Zithromax [azithromycin]; Avelox [moxifloxacin hcl in nacl]; Levaquin [levofloxacin in d5w]; Augmentin [amoxicillin-pot clavulanate]; Dilaudid [hydromorphone hcl]; Oxycodone hcl; and Tramadol   Review of Systems Review of Systems  Constitutional: Negative for fever.  HENT: Negative for sore throat.   Eyes: Negative for redness.  Respiratory: Negative for cough and shortness of breath.   Cardiovascular: Positive for chest pain. Negative for leg swelling.  Gastrointestinal: Negative for abdominal pain.  Genitourinary: Negative for flank pain.  Musculoskeletal: Negative for back pain.  Skin: Negative for rash.  Neurological: Negative for headaches.  Hematological: Does not bruise/bleed easily.  Psychiatric/Behavioral: Negative for confusion.     Physical Exam Updated Vital Signs BP (!) 147/95 (BP Location: Right Arm)   Pulse 98   Temp 98.5 F (36.9 C) (Oral)   Resp 18   Ht 1.651 m (5\' 5" )   Wt 86.2 kg (190 lb)   SpO2 100%   BMI 31.62 kg/m   Physical Exam  Constitutional: She appears well-developed and well-nourished.  HENT:  Head: Atraumatic.  Eyes: Conjunctivae are normal. No scleral icterus.  Neck: Neck supple. No tracheal deviation present.  Cardiovascular: Normal rate, regular rhythm, normal heart sounds and intact distal pulses. Exam reveals no gallop and no friction rub.  No murmur heard. Pulmonary/Chest: Effort normal and breath sounds normal. No respiratory distress.  Abdominal: Soft. Normal appearance and bowel sounds  are normal. She exhibits no distension. There is no tenderness.  Musculoskeletal: She exhibits no edema or tenderness.  Neurological: She is alert.  Skin: Skin is warm and dry. No rash noted. She is not diaphoretic.  Psychiatric: She has a normal mood and affect.  Nursing note and vitals reviewed.    ED Treatments / Results  Labs (all labs ordered are listed, but only abnormal results are displayed) Results for orders placed or performed during the hospital encounter of 75/10/25  Basic metabolic panel  Result Value Ref Range   Sodium 143 135 - 145 mmol/L   Potassium 3.8 3.5 - 5.1 mmol/L   Chloride 107 98 - 111 mmol/L   CO2 24 22 - 32 mmol/L   Glucose, Bld 118 (H) 70 - 99 mg/dL   BUN 11 6 - 20 mg/dL   Creatinine, Ser 0.96 0.44 -  1.00 mg/dL   Calcium 9.1 8.9 - 10.3 mg/dL   GFR calc non Af Amer >60 >60 mL/min   GFR calc Af Amer >60 >60 mL/min   Anion gap 12 5 - 15  CBC  Result Value Ref Range   WBC 7.5 4.0 - 10.5 K/uL   RBC 4.50 3.87 - 5.11 MIL/uL   Hemoglobin 13.7 12.0 - 15.0 g/dL   HCT 43.3 36.0 - 46.0 %   MCV 96.2 78.0 - 100.0 fL   MCH 30.4 26.0 - 34.0 pg   MCHC 31.6 30.0 - 36.0 g/dL   RDW 12.8 11.5 - 15.5 %   Platelets 285 150 - 400 K/uL  D-dimer, quantitative (not at [facility])  Result Value Ref Range   D-Dimer, Quant <0.27 0.00 - 0.50 ug/mL-FEU  I-stat troponin, ED  Result Value Ref Range   Troponin i, poc 0.00 0.00 - 0.08 ng/mL   Comment 3            EKG EKG Interpretation  Date/Time:  Friday February 24 2018 20:15:10 EDT Ventricular Rate:  101 PR Interval:  158 QRS Duration: 78 QT Interval:  344 QTC Calculation: 446 R Axis:   8 Text Interpretation:  Sinus tachycardia Confirmed by Lajean Saver [phone]) on 02/24/2018 8:52:14 PM   Radiology Dg Chest 2 View  Result Date: 02/24/2018 CLINICAL DATA:  Chest pressure. EXAM: CHEST - 2 VIEW COMPARISON:  March 12, 2004 FINDINGS: The heart, hila, and mediastinum are normal. No pneumothorax. No pulmonary nodules or masses.  No focal infiltrates. IMPRESSION: No active cardiopulmonary disease. Electronically Signed   By: Dorise Bullion III M.D   On: 02/24/2018 21:20    Procedures Procedures (including critical care time)  Medications Ordered in ED Medications - No data to display   Initial Impression / Assessment and Plan / ED Course  I have reviewed the triage vital signs and the nursing notes.  Pertinent labs & imaging results that were available during my care of the patient were reviewed by me and considered in my medical decision making (see chart for details).  Ecg. Labs. Cxr.  Reviewed nursing notes and prior charts for additional history. Cardiac cath 07/2012 for chest pain w normal coronaries.   Will try gi meds for symptom relief.   Labs reviewed - trop is normal. ddimer also normal.   Xray reviewed - no pna.  Pt currently appears stable for d/c.     Final Clinical Impressions(s) / ED Diagnoses   Final diagnoses:  None    ED Discharge Orders    None       Lajean Saver, MD 02/24/18 [id number]

## 2018-03-23 ENCOUNTER — Telehealth: Payer: Self-pay | Admitting: Neurology

## 2018-03-23 NOTE — Telephone Encounter (Signed)
Called the pt back and there was no answer. LVM to let her know that she has not been seen in over a year. Pt will need to have an office apt scheduled and then they can decide to proceed forward with additional testing. Informed the pt on VM that Dr [name] is fully booked at this time. We can get her scheduled with NP if opening is still present at the time she calls and I can add her to the wait list if an opening becomes available with Dr [name]. Instructed the pt to call back and get scheduled.

## 2018-03-23 NOTE — Telephone Encounter (Signed)
Pt requesting a call stating she hasn't had her visional or hearing testing done. Pt stated she had these 2 appts here back to back. Please call to advise?

## 2018-04-25 ENCOUNTER — Encounter: Payer: Self-pay | Admitting: Neurology

## 2018-04-25 ENCOUNTER — Ambulatory Visit: Payer: PRIVATE HEALTH INSURANCE | Admitting: Neurology

## 2018-04-25 ENCOUNTER — Telehealth: Payer: Self-pay | Admitting: Neurology

## 2018-04-25 VITALS — BP 154/103 | HR 76 | Ht 65.0 in | Wt 216.0 lb

## 2018-04-25 DIAGNOSIS — E220 Acromegaly and pituitary gigantism: Secondary | ICD-10-CM | POA: Diagnosis not present

## 2018-04-25 DIAGNOSIS — R0683 Snoring: Secondary | ICD-10-CM

## 2018-04-25 MED ORDER — ESTRADIOL 0.75 MG/1.25 GM (0.06%) TD GEL: 1.2500 g | Freq: Every day | TRANSDERMAL | 12 refills | Status: DC

## 2018-04-25 NOTE — Progress Notes (Signed)
SLEEP MEDICINE CLINIC   Provider:  Larey Seat, M D  Referring Provider: Marton Redwood, MD Primary Care Physician:  Marton Redwood, MD  Chief Complaint  Patient presents with  . Follow-up    pt alone, pt states she is having daytime sleepiness, she thinks that this is related to that fact she isnt sleeping well during the night. she states she may not be breathing as well at night time  she states about 6 months ago she started to wake herself up snorting and waking herself up. she states with her current dx her neck is becoming thicker. she is having brain fog. waking up feeling like she didnt get enough sleep. husband notices snoring.    HPI:  [name] [name] is a 52 y.o. female , seen in a RV for 04-25-2018, I have the pleasure of seeing [name] [name] today who had become my patient originally when she was referred for sleep study.  Her sleep study took place on 15 May 2009 and at the time revealed an AHI of only 1.1 and during REM sleep of 9.2/h.  We repeated a sleep study upon request of her primary care physician 7 years later on 07 June 2016, she again had an AHI of only 1.2 and supposing been no respiratory event related arousals.  This PSG was an attended sleep study.  In supine sleep there was slightly more apnea noted during REM sleep there was an accentuation to 6.8/h.  She did not have oxygen desaturation of clinical significance and very few periodic limb movements they did not lead to arousals.  Now she presents with similar concerns but the added observation by her husband, [name], of snoring that he had never noticed on her before.  She feels also that her throat has been tighter and that it has been difficult to swallow at times.  She continues to have an indurated and puffy appearing skin, and her physical appearance has changed.  [name] [name] had researched her condition and had suspected that she may have been her Crohn's disease.  However specialist in that field  have not seen her and she has been to some degree Ms. lead.  She made an appointment for the [facility] where she was told that only the main [place] branch does investigate this condition. She had an Discontinued hormone replacement therapy when she found information that it may make lipomas grow faster.  She slept much better while she was on hormone replacement therapy and I would like for her to resume.  To this day we do not have lipoma documented or found in any of our clinical tests but there is clearly the appearance of a thickened leathery skin, puffiness, and a change in her facial features. She developed a buffalo hump. She is in pain.   I also would like to obtain an MRI of the brain with special attention to the pituitary ruling out acromegaly.     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 She has been seen in our sleep clinic before,and exactly 7 years ago underwent a sleep study that diagnosed no apnea, no PLM and no organic disease.  Her last sleep study took place on 05/15/2009 and revealed an AHI of 1.1 even in supine there was no accentuation but in rem sleep her AHI was 9.2. She did not have oxygen desaturations at the time and snoring was noted as being mild. Heart rate varied in normal range and remained in sinus rhythm. We discussed at the time  to change her bedtime to an earlier time of day and to try Flexeril as helpful for muscle relaxation and sleep induction at a low dose nightly. That was helpful for years, but not any longer. [name] [name]'s husband, an ED Physician, has not noted changes in her sleep, but he is de facto a shift Insurance underwriter.  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 These painful nodes are manifesting like cellulite on extremities and abdomen, and she feels misunderstood.  She is postmenopausal , enrolled into the [facility] in Spring 2016 , lost 40 pounds. Now  en plateau. She still gained in girth while losing weight ! One possible explanation is that subcutaneous lipomata can be stimulated by lactic acid, therfore by exercise. She has incontinence problem.  She relates her loss of sleep to her pain all over her body. She already underwent an MRI of the brain and the femur to look at the possibility of having developed lipomata for a central nervous system disease that could correlate to these pains. The one place she can easily sleep is in the cinema, and in the houses of friends.  07-21-2016 I have  the pleasure of seeing [name] [name] today following a sleep study from November 2017, her AHI was 1.2 and there is no apnea going on and she did not have hypoxemia she did not have periodic limb movement arousals, and she had highly efficient sleep was normal sleep architecture including normal REM sleep distribution. Her heart rate remained in normal sinus rhythm. She slept well and feels that she slept well. She had taken maxalt. She had a negative MRI for lipoma deposits, that she suspected she has.  She believes she has fibromyalgia or Dercum's disease- is in contact with Rosanne Gutting, in [place] , [place], Was recommended to do dry brushing.  Velda Young recommended Dr Chauncey Cruel. Deveshwar, Rheumatology. Dr [name] wrote her referral. paleo diet, "no red food , no dairy ".  Epworth 8 , FSS 16.  Interval history from the first of every 2018, [name] [name]  has insomnia related to pain, feels more more hard nodules affecting her extremities and torso, tender to touch, very frustrating. She was able to lose weight but has not felt that the subcutaneous fatty tissue has really changed.  Not longer tearful.  She went skiing and had one minor fall. With  any activity now she is concerned that she may generate "too much lactic acid "and create the presumed Dercums disease to progress further.  She continues to work with Javier Docker, PT.whohas mapped the painful spots. She exercises to  keep her muscle strength.  She has begun taking Cymbalta. Referral to Rheumatology, Dr. Estanislado Pandy is not seeing patients for about a month from now on- I would much appreciate if Dr. Trudie Reed or Charlynne Pander would be willing to see her. She also learned that she has borderline hypercholesterolemia but she follows a vegetarian diet, with eggs, little dairy.   Interval history from 10/21/2016, patient has not seen rheumatology at, Dr. Estanislado Pandy declined, [facility] declined, and apparently neither Dr. Lenna Gilford or Dr. Amil Amen has made appointments for her. She has seen her primary care physician yesterday but feels that she has been placed in a category that does not fit her. She does not believe that this is a psychosomatic expression or related to menopause. She has continued to see physical therapy once a week had helped in  her  Myalgia and connective tissue pain- symptoms through the different modality intervention. Continues with therapy under Javier Docker.  She reports subjective right leg heaviness. Having hot flushes and cannot sleep. Hair loss. Dry mouth and dry eyes. Weight gain.  I urged her to resume estradiol and progesterone - These symptoms can be related to menopause. She eats a calorie restricted diet and is very cautious with her food intake, she has a vegetarian but not strictly vegan diet. She still has gained body fat especially at the abdomen and over the triceps. She is very unhappy about this. I offered to send her to medical weight management for second opinion. Dr. Leafy Ro.  Interval history from 01/24/2017 [name] [name] wrote a summary of her symptoms and the development of her illness she reports that in September 2017 she begun having left knee numbness and swelling, she later developed painful lumps around the abdomen and upper arms, the thighs,- both arms feel very heavy and she has a loss of exercise tolerance, getting winded easily.  She reports numbness swelling in her extremities  extremely painful swelling in all joints especially at the hands , were it has made it difficult to hold on to an object. She feels that she is still gaining volume in the upper lower extremities and skin thickness she feels very heavy very lumpy and painful she feels that she is walking through quicksand. After sitting more than 30 minutes she will have numbing both legs and into the feet. Sometimes she has leg spasms, other times pin and needle dysesthesias. She has become larger at the abdomen 5 and upper arms in spite of being on a rigid diet is very distressing to her and remains unexplained she continues to do a lot of physical activity and watches her calories. 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  03-14-2017, Awaiting a referral acceptance from [facility].  Most recent lab results through her primary care have shown hypertriglyceridemia and hyperlipidemia, in a patient who eats a mostly vegan diet. She has gained further circumference of the upper arms, upper legs and abdomen and is very frustrated. Her fingers have also become bigger, her face more coarser. And she reports no decreased hearing in the right ear and blurring of vision at night while driving. Her MRI of October 2017 have been normal without evidence of a demyelinating disease, but I will order a visual evoked potential and a hearing brainstem evoked potential for her now.  Review of Systems: Out of a complete 14 system review, the patient complains of only the following symptoms, and all other reviewed systems are negative.  Soreness, rigidity, nodes all over the skin.  Feeling as if muscles are "squeezed" and tight,  Pain sensitive, with menopausal symptoms, fibromyalgia ?, whole body  achiness.  Tore MCL while skiing.   Social History   Socioeconomic History  . Marital status: Married    Spouse name: Not on file  . Number of children: Not on file  . Years of education: Not on file  . Highest education level: Not on file  Occupational History  . Not on file  Social Needs  . Financial resource strain: Not on file  . Food insecurity:    Worry: Not  on file    Inability: Not on file  . Transportation needs:    Medical: Not on file    Non-medical: Not on file  Tobacco Use  . Smoking status: Never Smoker  . Smokeless tobacco: Never Used  Substance and Sexual Activity  . Alcohol use: No    Frequency: Never  . Drug use: No  . Sexual activity: Yes  Lifestyle  . Physical activity:    Days per week: Not on file    Minutes per session: Not on file  . Stress: Not on file  Relationships  . Social connections:    Talks on phone: Not on file    Gets together: Not on file    Attends religious service: Not on file    Active member of club or organization: Not on file    Attends meetings of clubs or organizations: Not on file    Relationship status: Not on file  . Intimate partner violence:    Fear of current or ex partner: Not on file    Emotionally abused: Not on file    Physically abused: Not on file    Forced sexual activity: Not on file  Other Topics Concern  . Not on file  Social History Narrative  . Not on file    Family History  Problem Relation Age of Onset  . Hiatal hernia Mother   . Hypertension Mother   . Depression Mother   . Heart disease Father   . Stroke Maternal Grandfather   . Chronic Renal Failure Paternal Grandmother   . Drug abuse Son     Past Medical History:  Diagnosis Date  . Anemia    many years ago  . Anxiety    situational  . Arthritis    "hands" (07/12/2012)  . Asthma    seasonal   . Chest pain   . Chest pain at rest, on going 07/12/2012  . Family history of early CAD 07/12/2012  . Fibromyalgia   . GERD  (gastroesophageal reflux disease)   . Headache(784.0)    "often; not daily" (07/12/2012)  . Hypertension    not on medications  . Kidney stones   . Migraine   . Pneumonia    as a child  . PONV (postoperative nausea and vomiting)    she states she gets very sick    Past Surgical History:  Procedure Laterality Date  . breast lift    . CARDIAC CATHETERIZATION  07/11/2012  . CHOLECYSTECTOMY N/A 09/18/2015   Procedure: LAPAROSCOPIC CHOLECYSTECTOMY;  Surgeon: Coralie Keens, MD;  Location: [place];  Service: General;  Laterality: N/A;  . LEFT HEART CATHETERIZATION WITH CORONARY ANGIOGRAM N/A 07/11/2012   Procedure: LEFT HEART CATHETERIZATION WITH CORONARY ANGIOGRAM;  Surgeon: Lorretta Harp, MD;  Location: [facility] CATH [facility];  Service: Cardiovascular;  Laterality: N/A;  . REDUCTION MAMMAPLASTY Bilateral 10+ years ago  . TONSILLECTOMY  ~ 1976  . tubes and ovaries removed  2015  . VAGINAL HYSTERECTOMY  ~ 2009    Current Outpatient Medications  Medication Sig Dispense Refill  . albuterol (PROAIR HFA) 108 (90 Base) MCG/ACT inhaler Inhale 2 puffs into the lungs 2 (two) times daily as needed for wheezing or shortness of breath.    . cyclobenzaprine (FLEXERIL) 10 MG tablet Take 10 mg by mouth at bedtime.    Marland Kitchen EPINEPHrine (EPIPEN 2-PAK) 0.3 mg/0.3 mL IJ SOAJ injection Inject 0.3 mLs (0.3 mg total) into the muscle as needed. (Patient taking differently: Inject 0.3 mg into  the muscle once as needed (for anaphylactic reactions). ) 2 Device 1  . HYDROcodone-Acetaminophen (LORTAB) 10-300 MG/15ML SOLN Take 11.25 mLs by mouth every 4 (four) hours as needed (for pain).     Marland Kitchen levocetirizine (XYZAL) 5 MG tablet Take 5 mg by mouth at bedtime.    . montelukast (SINGULAIR) 10 MG tablet Take 10 mg by mouth daily.    . naproxen (NAPROSYN) 250 MG tablet Take 1 tablet (250 mg total) by mouth 2 (two) times daily as needed for mild pain or moderate pain (take with food). 14 tablet 0  . ondansetron (ZOFRAN ODT) 4 MG  disintegrating tablet Take 1 tablet (4 mg total) by mouth every 8 (eight) hours as needed for nausea or vomiting. 6 tablet 0  . RABEprazole (ACIPHEX) 20 MG tablet Take 20 mg by mouth 2 (two) times daily.    . rizatriptan (MAXALT-MLT) 10 MG disintegrating tablet Take 10 mg by mouth every 2 (two) hours as needed for migraine.   11  . scopolamine (TRANSDERM-SCOP, 1.5 MG,) 1 MG/3DAYS Place 1 patch onto the skin every 3 (three) days as needed (for motion sickness or nausea/vomiting).     . Vitamin D, Ergocalciferol, (DRISDOL) 50000 units CAPS capsule Take 50,000 Units by mouth every Wednesday.      No current facility-administered medications for this visit.     Allergies as of 04/25/2018 - Review Complete 04/25/2018  Allergen Reaction Noted  . Morphine and related Anaphylaxis and Nausea And Vomiting 09/26/2014  . Prednisone Anaphylaxis 02/24/2018  . Yellow jacket venom [bee venom] Anaphylaxis 02/24/2018  . Azithromycin Other (See Comments) 07/11/2012  . Erythromycin Nausea And Vomiting and Other (See Comments) 07/11/2012  . Iohexol Hives, Itching, and Swelling 10/08/2015  . Oxycontin [oxycodone] Nausea And Vomiting 07/25/2014  . Avelox [moxifloxacin hcl in nacl] Swelling 10/08/2015  . Codeine Nausea And Vomiting and Other (See Comments) 02/24/2018  . Gadolinium derivatives Hives, Itching, and Swelling 02/24/2018  . Levaquin [levofloxacin in d5w] Swelling 10/08/2015  . Oxycodone-acetaminophen Nausea And Vomiting 02/24/2018  . Pantoprazole Other (See Comments) 02/24/2018  . Augmentin [amoxicillin-pot clavulanate] Nausea And Vomiting 09/17/2015  . Dilaudid [hydromorphone hcl] Nausea And Vomiting 09/17/2015  . Oxycodone hcl Nausea And Vomiting 09/17/2015  . Tramadol Nausea Only 09/11/2015    Vitals: BP (!) 154/103   Pulse 76   Ht 5\' 5"  (1.651 m)   Wt 216 lb (98 kg)   BMI 35.94 kg/m  Last Weight:  Wt Readings from Last 1 Encounters:  04/25/18 216 lb (98 kg)   IRC:VELF mass index is  35.94 kg/m.     Last Height:   Ht Readings from Last 1 Encounters:  04/25/18 5\' 5"  (1.651 m)    Physical exam:  General: The patient is awake, alert and appears not in acute distress. The patient is well groomed. Head: Normocephalic, atraumatic. Neck is supple. Mallampati 3,  neck circumference: 13.5 !  . Nasal airflow congested , macroglossia borderline . Retrognathia. Cardiovascular:  Regular rate and rhythm , without  murmurs or carotid bruit, and without distended neck veins. Respiratory: Lungs are clear to auscultation. Skin:  Without evidence of pitting edema, or rash. Dimpled skin on abdomen and hips.  .  Trunk: BMI is elevated. The patient's posture is erect   Neurologic exam : The patient is awake and alert, oriented to place and time.  Memory subjective described as intact. Attention span & concentration ability appears normal.  Speech is fluent,  without dysarthria, dysphonia or aphasia. Mood and  affect are agitated and worried.   Cranial nerves: Pupils are equal and briskly reactive to light. Extraocular movements  in vertical and horizontal planes intact and without nystagmus. Visual fields by finger perimetry are intact.Hearing to finger rub intact. Facial sensation intact to fine touch. Facial motor strength is symmetric and tongue and uvula move midline. Shoulder shrug was symmetrical.    The patient's illness is still not categorized, and she  was dismissed by rheumatology.  .   -I spent more than 25  minutes of face to face time with the patient. Greater than 50% of time was spent in counseling and coordination of care. We have discussed the diagnosis and differential and I answered the patient's questions.  There is an overlying concern of pain and subjective nodules all over the body.  Visible is for my that the skin on abdomen and hips  is dimpled and tight, that her skin is indurated.   IMPRESSION: Interval 2.3 x 2.1 x 1.5 cm mass in the left upper  buttocks subcutaneous fat. This has an appearance most compatible with developing fat necrosis. Changes due to an interval subcutaneous injection are also possible. A neoplasm is less likely.   Electronically Signed   By: Claudie Revering M.D.   On: 03/11/2017 17:39  She wants to see the [facility] clinic.   Assessment:  After physical and neurologic examination, review of laboratory studies,  Personal review of imaging studies, reports of other /same  Imaging studies ,  Results of polysomnography/ neurophysiology testing and pre-existing records as far as provided in visit.,   1)  [name] [name]'s  sleep concerns are related to subjective insomnia, based on a distorted sleep perception. She has menopausal symptoms. I will order an attended sleep study for ruling out OSA- her husband has observed snoring, and possible  apnea. Reviewed the 2 previous sleep studies. Plan : New onset snoring - sleep study ordered.   2) She has extreme fatigue and insomnia, May be helped with progesterone. Resume Hormone replacement therapy.   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    Plan:    And I would like to rule out acromegaly- pituitary MRI   I encourage resuming HRTherapy- low dose estradiol and progesterone at night. She has hot flushes. No HTN noted- not scleroderma.    Summary :Normal auditory test, normal  brainstem evoked potential for hearing loss- with Dr. Jannifer Franklin in 2018 . She had a normal MRI in 05-2016- this is not MS ! She still feels that her skin is indurated, thickened, and feels that nodes are popping up everywhere.  Summary of two previous PSGs in 2010 and 2017, all without OSA.   I regret that I have no answer for [name] [name].    Asencion Partridge Emer Onnen MD  04/25/2018   CC: Marton Redwood, [place], [phone]

## 2018-04-25 NOTE — Addendum Note (Signed)
Addended by: Larey Seat on: 04/25/2018 12:17 PM   Modules accepted: Orders

## 2018-04-25 NOTE — Telephone Encounter (Signed)
[facility] order sent to GI. They will obtain the auth and will reach out to the pt to schedule.

## 2018-05-24 ENCOUNTER — Other Ambulatory Visit: Payer: Self-pay | Admitting: *Deleted

## 2018-05-24 ENCOUNTER — Telehealth: Payer: Self-pay | Admitting: Neurology

## 2018-05-24 DIAGNOSIS — E220 Acromegaly and pituitary gigantism: Secondary | ICD-10-CM

## 2018-05-24 NOTE — Telephone Encounter (Signed)
Noted thank you

## 2018-05-24 NOTE — Telephone Encounter (Signed)
Placed order for signature.

## 2018-05-24 NOTE — Telephone Encounter (Signed)
Pt has called re: MRI for tomorrow.  The MRI for her face is supposed to be for her face with contrast.  Pt is allergic to contrast.  Pt wants to know if no contrast is used will there still be clear results.  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 Pt states she has been told there is a special type of MRI for pt's with  Zercums disease pt is asking if that will be ordered for her please call

## 2018-05-24 NOTE — Telephone Encounter (Signed)
 imaging has been trying to contact the patient to schedule MRI. I informed patient to give GI a call.. She did call them to schedule but Prentiss Bells with GI called me and told me patient has a allgeric reaction to the contrast and with the 13 hour prep of prednisone she has a allergic reaction to that and it makes her go into anaphylaxis shot.. Do you mind switching her MRI Brain to wo contrast.

## 2018-05-24 NOTE — Telephone Encounter (Signed)
I cannot order an MRI of the face- I don't know of a special sequence for the disorder she named. I will do brain and spine if needed, but not non- neurological images.   Larey Seat, MD

## 2018-05-25 ENCOUNTER — Other Ambulatory Visit: Payer: PRIVATE HEALTH INSURANCE

## 2018-05-25 NOTE — Telephone Encounter (Signed)
LMVM for pt and relayed the message per Dr. [name].  Will proceed with MRI brain w/o contrast.  Pt to call back if questions.

## 2018-05-29 ENCOUNTER — Ambulatory Visit
Admission: RE | Admit: 2018-05-29 | Discharge: 2018-05-29 | Disposition: A | Discharge status: Home / Self Care | Payer: PRIVATE HEALTH INSURANCE | Source: Ambulatory Visit | Attending: Neurology | Admitting: Neurology

## 2018-05-29 DIAGNOSIS — E220 Acromegaly and pituitary gigantism: Secondary | ICD-10-CM

## 2018-05-29 NOTE — Telephone Encounter (Signed)
Patient called this morning stating that she only has a allergic reaction to the CT contrast . She stated that she wants to proceed on having the MR Brain w/wo contrast .. She is scheduled at GI for 06/04/18.

## 2018-06-04 ENCOUNTER — Ambulatory Visit
Admission: RE | Admit: 2018-06-04 | Discharge: 2018-06-04 | Disposition: A | Discharge status: Home / Self Care | Payer: PRIVATE HEALTH INSURANCE | Source: Ambulatory Visit | Attending: Neurology | Admitting: Neurology

## 2018-06-04 DIAGNOSIS — E220 Acromegaly and pituitary gigantism: Secondary | ICD-10-CM

## 2018-06-04 MED ORDER — GADOBENATE DIMEGLUMINE 529 MG/ML IV SOLN
10.0000 mL | Freq: Once | INTRAVENOUS | Status: AC | PRN
  Administered 2018-06-04: 10 mL via INTRAVENOUS

## 2018-06-05 ENCOUNTER — Encounter: Payer: Self-pay | Admitting: Neurology

## 2018-06-05 ENCOUNTER — Emergency Department (HOSPITAL_COMMUNITY)
Admission: EM | Admit: 2018-06-05 | Discharge: 2018-06-06 | Disposition: A | Discharge status: Home / Self Care | Payer: PRIVATE HEALTH INSURANCE | Attending: Emergency Medicine | Admitting: Emergency Medicine

## 2018-06-05 ENCOUNTER — Other Ambulatory Visit: Payer: Self-pay

## 2018-06-05 ENCOUNTER — Emergency Department (HOSPITAL_COMMUNITY): Payer: PRIVATE HEALTH INSURANCE

## 2018-06-05 DIAGNOSIS — R109 Unspecified abdominal pain: Secondary | ICD-10-CM | POA: Insufficient documentation

## 2018-06-05 DIAGNOSIS — I10 Essential (primary) hypertension: Secondary | ICD-10-CM | POA: Insufficient documentation

## 2018-06-05 DIAGNOSIS — Z79899 Other long term (current) drug therapy: Secondary | ICD-10-CM | POA: Diagnosis not present

## 2018-06-05 LAB — CBC WITH DIFFERENTIAL/PLATELET
Abs Immature Granulocytes: 0.03 10*3/uL (ref 0.00–0.07)
Basophils Absolute: 0.1 10*3/uL (ref 0.0–0.1)
Basophils Relative: 1 %
Eosinophils Absolute: 0.1 10*3/uL (ref 0.0–0.5)
Eosinophils Relative: 1 %
HCT: 42.1 % (ref 36.0–46.0)
Hemoglobin: 13.4 g/dL (ref 12.0–15.0)
Immature Granulocytes: 0 %
Lymphocytes Relative: 30 %
Lymphs Abs: 2.6 10*3/uL (ref 0.7–4.0)
MCH: 30.5 pg (ref 26.0–34.0)
MCHC: 31.8 g/dL (ref 30.0–36.0)
MCV: 95.7 fL (ref 80.0–100.0)
Monocytes Absolute: 0.7 10*3/uL (ref 0.1–1.0)
Monocytes Relative: 8 %
Neutro Abs: 5.1 10*3/uL (ref 1.7–7.7)
Neutrophils Relative %: 60 %
Platelets: 301 10*3/uL (ref 150–400)
RBC: 4.4 MIL/uL (ref 3.87–5.11)
RDW: 12.6 % (ref 11.5–15.5)
WBC: 8.6 10*3/uL (ref 4.0–10.5)
nRBC: 0 % (ref 0.0–0.2)

## 2018-06-05 LAB — COMPREHENSIVE METABOLIC PANEL
ALT: 19 U/L (ref 0–44)
AST: 19 U/L (ref 15–41)
Albumin: 4.2 g/dL (ref 3.5–5.0)
Alkaline Phosphatase: 75 U/L (ref 38–126)
Anion gap: 8 (ref 5–15)
BUN: 17 mg/dL (ref 6–20)
CO2: 30 mmol/L (ref 22–32)
Calcium: 9.1 mg/dL (ref 8.9–10.3)
Chloride: 105 mmol/L (ref 98–111)
Creatinine, Ser: 0.87 mg/dL (ref 0.44–1.00)
GFR calc Af Amer: >60 mL/min (ref >60)
GFR calc non Af Amer: >60 mL/min (ref >60)
Glucose, Bld: 99 mg/dL (ref 70–99)
Potassium: 4 mmol/L (ref 3.5–5.1)
Sodium: 143 mmol/L (ref 135–145)
Total Bilirubin: 0.6 mg/dL (ref 0.3–1.2)
Total Protein: 7.3 g/dL (ref 6.5–8.1)

## 2018-06-05 LAB — URINALYSIS, ROUTINE W REFLEX MICROSCOPIC
Bilirubin Urine: NEGATIVE
Glucose, UA: NEGATIVE mg/dL
Hgb urine dipstick: NEGATIVE
Ketones, ur: NEGATIVE mg/dL
Leukocytes, UA: NEGATIVE
Nitrite: NEGATIVE
Protein, ur: NEGATIVE mg/dL
Specific Gravity, Urine: 1.017 (ref 1.005–1.030)
pH: 7 (ref 5.0–8.0)

## 2018-06-05 LAB — LIPASE, BLOOD: Lipase: 38 U/L (ref 11–51)

## 2018-06-05 LAB — I-STAT BETA HCG BLOOD, ED (MC, WL, AP ONLY): I-stat hCG, quantitative: 5.8 m[IU]/mL — ABNORMAL HIGH (ref <5)

## 2018-06-05 MED ORDER — ONDANSETRON HCL 4 MG/2ML IJ SOLN
4.0000 mg | Freq: Once | INTRAMUSCULAR | Status: AC
  Administered 2018-06-05: 4 mg via INTRAVENOUS
  Filled 2018-06-05: qty 2

## 2018-06-05 MED ORDER — KETOROLAC TROMETHAMINE 15 MG/ML IJ SOLN
15.0000 mg | Freq: Once | INTRAMUSCULAR | Status: AC
  Administered 2018-06-05: 15 mg via INTRAVENOUS
  Filled 2018-06-05: qty 1

## 2018-06-05 MED ORDER — SODIUM CHLORIDE 0.9 % IV BOLUS
500.0000 mL | Freq: Once | INTRAVENOUS | Status: AC
  Administered 2018-06-05: 500 mL via INTRAVENOUS

## 2018-06-05 NOTE — ED Provider Notes (Signed)
[facility] DEPT Provider Note   CSN: [id number] Arrival date & time: 06/05/18  2117     History   Chief Complaint Chief Complaint  Patient presents with  . Flank Pain    HPI [name] [name] is a 52 y.o. female.  HPI   56yF with R flank pain. Began aching several days ago in R flank. Becoming much more intense today and also feeling in her back and towards RUQ. Constant. Worse when walking. Gross hematuria and urinary urgency. No dysuria. No fever or chills. Nausea. No vomiting. Past history of kidney stones with previous lithotripsy. Surgical history significant for cholecystectomy and hysterectomy. She has been taking ibuprofen w/o much improvement.    Past Medical History:  Diagnosis Date  . Anemia    many years ago  . Anxiety    situational  . Arthritis    "hands" (07/12/2012)  . Asthma    seasonal   . Chest pain   . Chest pain at rest, on going 07/12/2012  . Family history of early CAD 07/12/2012  . Fibromyalgia   . GERD (gastroesophageal reflux disease)   . Headache(784.0)    "often; not daily" (07/12/2012)  . Hypertension    not on medications  . Kidney stones   . Migraine   . Pneumonia    as a child  . PONV (postoperative nausea and vomiting)    she states she gets very sick    Patient Active Problem List   Diagnosis Date Noted  . Lipoedema 03/14/2017  . Blurring of vision 03/14/2017  . Class 2 obesity with body mass index (BMI) of 38.0 to 38.9 in adult 03/14/2017  . Fibromyalgia 11/09/2016  . History of migraine 11/09/2016  . History of environmental allergies 11/09/2016  . Abdominal apron 10/21/2016  . Myalgia 05/13/2016  . Adjustment insomnia 05/13/2016  . Snoring 05/13/2016  . 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 Chest pain at rest, on going, probable GI source 07/12/2012  . HTN (hypertension) 07/12/2012  . Family history of early CAD 07/12/2012  . Anxiety 07/12/2012    Past Surgical History:  Procedure  Laterality Date  . breast lift    . CARDIAC CATHETERIZATION  07/11/2012  . CHOLECYSTECTOMY N/A 09/18/2015   Procedure: LAPAROSCOPIC CHOLECYSTECTOMY;  Surgeon: Coralie Keens, MD;  Location: [facility];  Service: General;  Laterality: N/A;  . LEFT HEART CATHETERIZATION WITH CORONARY ANGIOGRAM N/A 07/11/2012   Procedure: LEFT HEART CATHETERIZATION WITH CORONARY ANGIOGRAM;  Surgeon: Lorretta Harp, MD;  Location: [facility] CATH LAB;  Service: Cardiovascular;  Laterality: N/A;  . REDUCTION MAMMAPLASTY Bilateral 10+ years ago  . TONSILLECTOMY  ~ 1976  . tubes and ovaries removed  2015  . VAGINAL HYSTERECTOMY  ~ 2009     OB History   None      Home Medications    Prior to Admission medications   Medication Sig Start Date End Date Taking? Authorizing Provider  albuterol (PROAIR HFA) 108 (90 Base) MCG/ACT inhaler Inhale 2 puffs into the lungs 2 (two) times daily as needed for wheezing or shortness of breath.    [provider]  cyclobenzaprine (FLEXERIL) 10 MG tablet Take 10 mg by mouth at bedtime.    [provider]  EPINEPHrine (EPIPEN 2-PAK) 0.3 mg/0.3 mL IJ SOAJ injection Inject 0.3 mLs (0.3 mg total) into the muscle as needed. Patient taking differently: Inject 0.3 mg into the muscle once as needed (for anaphylactic reactions).  01/30/18   Carmin Muskrat, MD  Estradiol  0.75 MG/1.25 GM (0.06%) topical gel Place 1.25 g onto the skin daily. 04/25/18   Dohmeier, Asencion Partridge, MD  HYDROcodone-Acetaminophen (LORTAB) 10-300 MG/15ML SOLN Take 11.25 mLs by mouth every 4 (four) hours as needed (for pain).     [provider]  levocetirizine (XYZAL) 5 MG tablet Take 5 mg by mouth at bedtime.    [provider]  montelukast (SINGULAIR) 10 MG tablet Take 10 mg by mouth daily.    [provider]  naproxen (NAPROSYN) 250 MG tablet Take 1 tablet (250 mg total) by mouth 2 (two) times daily as needed for mild pain or moderate pain (take with food). 08/19/17   Francine Graven,  DO  ondansetron (ZOFRAN ODT) 4 MG disintegrating tablet Take 1 tablet (4 mg total) by mouth every 8 (eight) hours as needed for nausea or vomiting. 08/19/17   Francine Graven, DO  RABEprazole (ACIPHEX) 20 MG tablet Take 20 mg by mouth 2 (two) times daily.    [provider]  rizatriptan (MAXALT-MLT) 10 MG disintegrating tablet Take 10 mg by mouth every 2 (two) hours as needed for migraine.  06/18/15   [provider]  scopolamine (TRANSDERM-SCOP, 1.5 MG,) 1 MG/3DAYS Place 1 patch onto the skin every 3 (three) days as needed (for motion sickness or nausea/vomiting).     [provider]  Vitamin D, Ergocalciferol, (DRISDOL) 50000 units CAPS capsule Take 50,000 Units by mouth every Wednesday.     [provider]    Family History Family History  Problem Relation Age of Onset  . Hiatal hernia Mother   . Hypertension Mother   . Depression Mother   . Heart disease Father   . Stroke Maternal Grandfather   . Chronic Renal Failure Paternal Grandmother   . Drug abuse Son     Social History Social History   Tobacco Use  . Smoking status: Never Smoker  . Smokeless tobacco: Never Used  Substance Use Topics  . Alcohol use: No    Frequency: Never  . Drug use: No     Allergies   Morphine and related; Prednisone; Yellow jacket venom [bee venom]; Azithromycin; Erythromycin; Iohexol; Oxycontin [oxycodone]; Avelox [moxifloxacin hcl in nacl]; Codeine; Gadolinium derivatives; Levaquin [levofloxacin in d5w]; Oxycodone-acetaminophen; Pantoprazole; Augmentin [amoxicillin-pot clavulanate]; Dilaudid [hydromorphone hcl]; Oxycodone hcl; and Tramadol   Review of Systems Review of Systems  All systems reviewed and negative, other than as noted in HPI.  Physical Exam Updated Vital Signs BP (!) 141/90 (BP Location: Left Arm)   Pulse 87   Temp 98 F (36.7 C) (Oral)   Resp 17   Ht 5\' 5"  (1.651 m)   SpO2 98%   BMI 35.94 kg/m   Physical Exam  Constitutional: She  appears well-developed and well-nourished. No distress.  HENT:  Head: Normocephalic and atraumatic.  Eyes: Conjunctivae are normal. Right eye exhibits no discharge. Left eye exhibits no discharge.  Neck: Neck supple.  Cardiovascular: Normal rate, regular rhythm and normal heart sounds. Exam reveals no gallop and no friction rub.  No murmur heard. Pulmonary/Chest: Effort normal and breath sounds normal. No respiratory distress.  Abdominal: Soft. She exhibits no distension. There is tenderness.  RLQ and RUQ tenderness w/o rebound or guarding. R CVA tenderness.   Musculoskeletal: She exhibits no edema or tenderness.  Neurological: She is alert.  Skin: Skin is warm and dry.  Psychiatric: She has a normal mood and affect. Her behavior is normal. Thought content normal.  Nursing note and vitals reviewed.    ED  Treatments / Results  Labs (all labs ordered are listed, but only abnormal results are displayed) Labs Reviewed  I-STAT BETA HCG BLOOD, ED ([facility], [facility], [facility] ONLY) - Abnormal; Notable for the following components:      Result Value   I-stat hCG, quantitative 5.8 (*)    All other components within normal limits  URINALYSIS, ROUTINE W REFLEX MICROSCOPIC  CBC WITH DIFFERENTIAL/PLATELET  COMPREHENSIVE METABOLIC PANEL  LIPASE, BLOOD    EKG None  Radiology No results found.   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  T1 sagittal, T2 coronal and postcontrast views in the axial and  coronal plane were obtained.   Additional pre and post dynamic contrast T1 weighted images through the pituitary gland were obtained.    CONTRAST: 10 ml Multihance IMAGING SITE: [facility], [place]. FINDINGS: On sagittal images, the spinal cord is imaged caudally to C3 and is normal in caliber.   The contents of the posterior fossa are of normal size and position.   The pituitary gland is normal in size and contour.    It has a normal enhancement pattern.   The stalk is midline.  The optic chiasm appear normal.    Brain volume appears normal.   The ventricles are normal in size and without distortion.  There are no abnormal extra-axial collections of fluid.  The cerebellum and brainstem appears normal.   The deep gray matter appears normal.  In the hemispheres, there are some scattered T2/FLAIR foci in the subcortical and deep white matter.   None of these appear acute and the pattern is similar to what was observed in 2017. Marland Kitchen   Diffusion weighted images are normal.  Susceptibility weighted images are normal.   The orbits appear normal.   The VIIth/VIIIth nerve complex appears normal.  The mastoid air cells appear normal.  The paranasal sinuses appear normal.  Flow voids are identified within the major intracerebral arteries.  After the infusion of contrast material, a normal enhancement pattern is noted.    This MRI of the brain and pituitary gland with and without contrast shows the following. 1.   The pituitary gland appears normal. 2.    There are some scattered foci in the hemispheres consistent with mild chronic microvascular ischemic change, similar to the 2017 MRI 3.   There are no acute findings and there is a normal enhancement pattern. INTERPRETING PHYSICIAN: Richard A. Felecia Shelling, MD, PhD, FAAN Certified in  Neuroimaging by [employer]   Ct Renal Stone Study  Result Date: 06/05/2018 CLINICAL DATA:  Right flank pain for 5 days. History of kidney stones. EXAM: CT  ABDOMEN AND PELVIS WITHOUT CONTRAST TECHNIQUE: Multidetector CT imaging of the abdomen and pelvis was performed following the standard protocol without IV contrast. COMPARISON:  08/19/2017 FINDINGS: Lower chest: Minimal basilar atelectasis or scarring. No pleural effusion. Hepatobiliary: No focal liver abnormality is seen. Status post cholecystectomy. No biliary dilatation. Pancreas: Unremarkable. Spleen: Unremarkable. Adrenals/Urinary Tract: Unremarkable adrenal glands. Unchanged 2 mm stone in the lower pole of the right  kidney. No hydronephrosis. No ureteral dilatation. Unremarkable bladder. Stomach/Bowel: The stomach is within normal limits. There is no evidence of bowel obstruction or inflammation. The appendix is unremarkable. Vascular/Lymphatic: Normal caliber of the abdominal aorta. No enlarged lymph nodes. Reproductive: Status post hysterectomy. No adnexal masses. Other: No intraperitoneal free fluid. No abdominal wall hernia. Musculoskeletal: No acute osseous abnormality or suspicious osseous lesion. IMPRESSION: 1. No acute abnormality identified in the abdomen or pelvis. 2. Unchanged nonobstructing right nephrolithiasis. Electronically Signed   By: Logan Bores M.D.   On: 06/05/2018 22:18    Procedures Procedures (including critical care time)  Medications Ordered in ED Medications  ketorolac (TORADOL) 15 MG/ML injection 15 mg (has no administration in time range)  ondansetron (ZOFRAN) injection 4 mg (has no administration in time range)     Initial Impression / Assessment and Plan / ED Course  I have reviewed the triage vital signs and the nursing notes.  Pertinent labs & imaging results that were available during my care of the patient were reviewed by me and considered in my medical decision making (see chart for details).     52 year old female with right flank pain.  Symptoms seem very consistent with ureteral colic.  Now improved after Toradol.  CT the abdomen pelvis without acute  explanatory pathology though.  Passed kidney stone?  Final Clinical Impressions(s) / ED Diagnoses   Final diagnoses:  Flank pain    ED Discharge Orders    None       Virgel Manifold, MD 06/08/18 1105

## 2018-06-05 NOTE — ED Notes (Signed)
Bed: WA08 Expected date:  Expected time:  Means of arrival:  Comments: Hold 

## 2018-06-05 NOTE — ED Triage Notes (Signed)
Pt states she has had pain since Sat. Has history stones, pain in rt flank felt anterior and posterior. Blood noted in urine starting yesterday.

## 2018-06-07 ENCOUNTER — Telehealth: Payer: Self-pay | Admitting: Neurology

## 2018-06-07 NOTE — Telephone Encounter (Signed)
Called the patient and advised her that Dr Dohmeier reviewed the MRI and that it was normal as well as the pituitary gland was normal size. Advised there was nothing of concern based off the MRI. Pt verbalized understanding.

## 2018-06-07 NOTE — Telephone Encounter (Signed)
-----   Message from Darleen Crocker, RN sent at 06/05/2018 10:08 AM EST -----   ----- Message ----- From: Larey Seat, MD Sent: 06/05/2018   8:10 AM EST To: Marton Redwood, MD, Darleen Crocker, RN  Suspected acromegaly- but MRI brain is normal- pituitary gland was of normal size.

## 2018-07-03 ENCOUNTER — Ambulatory Visit (INDEPENDENT_AMBULATORY_CARE_PROVIDER_SITE_OTHER): Payer: PRIVATE HEALTH INSURANCE | Admitting: Neurology

## 2018-07-03 DIAGNOSIS — E220 Acromegaly and pituitary gigantism: Secondary | ICD-10-CM

## 2018-07-03 DIAGNOSIS — R0683 Snoring: Secondary | ICD-10-CM

## 2018-07-03 DIAGNOSIS — G4710 Hypersomnia, unspecified: Secondary | ICD-10-CM

## 2018-07-04 ENCOUNTER — Ambulatory Visit: Payer: PRIVATE HEALTH INSURANCE | Admitting: Neurology

## 2018-07-05 ENCOUNTER — Encounter: Payer: Self-pay | Admitting: Neurology

## 2018-07-12 ENCOUNTER — Telehealth: Payer: Self-pay | Admitting: Neurology

## 2018-07-12 DIAGNOSIS — R609 Edema, unspecified: Secondary | ICD-10-CM

## 2018-07-12 DIAGNOSIS — R0683 Snoring: Secondary | ICD-10-CM

## 2018-07-12 DIAGNOSIS — F5102 Adjustment insomnia: Secondary | ICD-10-CM

## 2018-07-12 DIAGNOSIS — G4719 Other hypersomnia: Secondary | ICD-10-CM

## 2018-07-12 NOTE — Procedures (Signed)
NAME: [name] [name]                                                         DOB: 1965-10-27 MEDICAL RECORD no:  [id number]                                     DOS:  07/03/2018 REFERRING PHYSICIAN: Marton Redwood, MD STUDY PERFORMED: Home Sleep Test on Watch Pat HISTORY:  [name] [name] is a 52 y.o. Caucasian female patient, seen in a RV on 04-25-2018. " I have the pleasure of seeing [name] [name] today, who originally was referred for a sleep study.  Her sleep study took place on 15 May 2009 and at the time revealed an AHI of only 1.1 and during REM sleep of 9.2/h.  We repeated a sleep study upon request of her primary care physician 7 years later on 07 June 2016, when she again had an AHI of only 1.2/.  This PSG was an attended sleep study. Now she presents with similar concerns but the added observation by her husband, [name], of louder snoring that he had not noticed on her before.  She feels also that her throat has been tighter and that it has been difficult to swallow at times.  She continues to have an indurated and puffy appearing skin, and her physical appearance has changed. [name] [name] had researched her condition.  STUDY RESULTS:  Total Recording Time: 7 h 32 min Total Sleep Time: 6 h 38min. Total Apnea/Hypopnea Index (AHI): 3.9/h; RDI: 5.6 /h. REM AHI 6.9/h. REM RDI 9.2/h Average Oxygen Saturation: 94%; Lowest Oxygen Saturation: 92%.  Total Time in Oxygen Saturation below 89 %:  0.0 minutes.  Average Heart Rate: 73 bpm (between 59 and 106 bpm). IMPRESSION: Except for snoring channel activation during REM sleep, there is not enough apnea / hypopnea present to justify intervention, certainly not enough for CPAP.  RECOMMENDATION: Snoring treatment can be attempted with a dental device, but the best way is weight loss. Consider medical weight management referral. I certify that I have reviewed the raw data recording prior to the issuance of this report in accordance with the standards of  the American Academy of Sleep Medicine (AASM). Larey Seat, M.D.    07-12-2018   Medical Director of [facility] at [facility], accredited by the AASM. Diplomat of the ABPN and ABSM.

## 2018-07-12 NOTE — Telephone Encounter (Signed)
Called the patient. No answer. LVM informing the patient that the sleep study results indicated that her apnea was too mild to require treatment with CPAP. Advised snoring was present and that Dr [name] would recommend weight loss management referral to help with weight loss. Advised the patient to call back with any questions she may have as well as if she would like for [place] to place the referral for her.

## 2018-07-13 ENCOUNTER — Encounter: Payer: Self-pay | Admitting: Neurology

## 2018-07-13 NOTE — Telephone Encounter (Signed)
Pt has called back to say she wanted to extend an apology to dr Dohmeier for missing her 12-03 appointment but she was very ill.  Pt is asking for a call from [place] to discuss next plan of action

## 2018-07-14 NOTE — Telephone Encounter (Signed)
Called the patient and states that she eats and excercises as much as possible. She eats healthy and walks 10,000 steps. Patient fell asleep in her car and having headaches everyday when he wakes up. She is snoring now. She also states one of the leads had come off at some point during the test. She is concerned with the amount of fatigue she is having during the day.

## 2018-07-14 NOTE — Telephone Encounter (Signed)
That weight loss program tests her metabolism- it will look at why and how she burns calories, or not.  I would add a HLA test . We can plan for MSLT ( narcolepsy work up)  but she needs to be off Flexaril, hydrocodone, and cannot take antidepressant medications for 14 days.   I can offer modafinil , but since she has no diagnosis it will be out of pocket.   CD

## 2018-07-19 NOTE — Telephone Encounter (Signed)
Called the patient to review what Dr [name] advised. Spent 10 min reviewing over the phone. The patient would like to move forward with testing. I will place the lab work for the patient to have completed and the sleep studies. Reviewed the patient's medication list and advised that we would have to make sure she hadn't taken any medications that would alter the MSLT test. Patient is willing to complete the process because she just feels there is something going on. Pt will call back after reviewing her weight management clinic noted from [facility] that she had already completed and inform if she would like for [place] to place the healthy weight and wellness referral.

## 2018-07-19 NOTE — Addendum Note (Signed)
Addended by: Darleen Crocker on: 07/19/2018 03:47 PM   Modules accepted: Orders

## 2018-07-24 ENCOUNTER — Other Ambulatory Visit (INDEPENDENT_AMBULATORY_CARE_PROVIDER_SITE_OTHER): Payer: Self-pay

## 2018-07-24 DIAGNOSIS — R609 Edema, unspecified: Secondary | ICD-10-CM

## 2018-07-24 DIAGNOSIS — G4719 Other hypersomnia: Secondary | ICD-10-CM

## 2018-07-24 DIAGNOSIS — R0683 Snoring: Secondary | ICD-10-CM

## 2018-07-24 DIAGNOSIS — F5102 Adjustment insomnia: Secondary | ICD-10-CM

## 2018-07-24 DIAGNOSIS — Z0289 Encounter for other administrative examinations: Secondary | ICD-10-CM

## 2018-07-28 ENCOUNTER — Other Ambulatory Visit: Payer: Self-pay | Admitting: Family Medicine

## 2018-07-28 DIAGNOSIS — R5383 Other fatigue: Secondary | ICD-10-CM

## 2018-07-28 DIAGNOSIS — I10 Essential (primary) hypertension: Secondary | ICD-10-CM

## 2018-07-31 LAB — NARCOLEPSY EVALUATION
HLA-DQ ALPHA: NEGATIVE
HLA-DQ BETA: NEGATIVE

## 2018-08-01 ENCOUNTER — Ambulatory Visit (HOSPITAL_COMMUNITY): Payer: PRIVATE HEALTH INSURANCE | Attending: Cardiology

## 2018-08-01 ENCOUNTER — Other Ambulatory Visit: Payer: Self-pay

## 2018-08-01 DIAGNOSIS — R5383 Other fatigue: Secondary | ICD-10-CM | POA: Insufficient documentation

## 2018-08-01 DIAGNOSIS — I10 Essential (primary) hypertension: Secondary | ICD-10-CM | POA: Diagnosis present

## 2018-08-03 ENCOUNTER — Telehealth: Payer: Self-pay | Admitting: Neurology

## 2018-08-03 NOTE — Telephone Encounter (Signed)
Called the patient and reviewed her lab work. Informed her that the narcolepsy gene came back negative in both strands. Informed the patient that it doesn't meant that she doesn't have narcolepsy it means that she doesn't carry the gene. The diagnostic tool is sleep study which she has been scheduled for. Advised the patient to make sure she doesn't take any medications that can alter the test 14 days prior to the sleep study date. This includes flexiril and hydrocodone, decongestants anything that can have a sedating effect. Pt verbalized understanding.

## 2018-08-30 ENCOUNTER — Ambulatory Visit (INDEPENDENT_AMBULATORY_CARE_PROVIDER_SITE_OTHER): Payer: PRIVATE HEALTH INSURANCE | Admitting: Neurology

## 2018-08-30 DIAGNOSIS — R0683 Snoring: Secondary | ICD-10-CM

## 2018-08-30 DIAGNOSIS — F5102 Adjustment insomnia: Secondary | ICD-10-CM

## 2018-08-30 DIAGNOSIS — G4719 Other hypersomnia: Secondary | ICD-10-CM

## 2018-08-30 DIAGNOSIS — R609 Edema, unspecified: Secondary | ICD-10-CM

## 2018-08-30 DIAGNOSIS — G4733 Obstructive sleep apnea (adult) (pediatric): Secondary | ICD-10-CM

## 2018-09-03 NOTE — Addendum Note (Signed)
Addended by: Larey Seat on: 09/03/2018 06:15 PM   Modules accepted: Orders

## 2018-09-03 NOTE — Procedures (Signed)
PATIENT'S NAME:  [name] [name], [name] [name] DOB:      08-Jul-1966      MR#:    [id number]     DATE OF RECORDING: 08/30/2018 REFERRING M.D.:  Carmie Kanner, MD Study Performed:   Baseline Polysomnogram for MSLT to follow. STUDY PERFORMED: Home Sleep Test on Watch Pat HISTORY:  [name] [name] is a 53 y.o. Caucasian female patient, seen in a RV on 04-25-2018.The patient tested negative for sleep apnea twice before by PSG, the last time in 2017. Now she presents with similar concerns but the added observation by her husband, Dr. Roderic [name], of louder snoring than he had not noticed before. HST STUDY RESULTS 07-03-2018:  Total Recording Time: 7 h 32 min Total Sleep Time: 6 h 42min. Total Apnea/Hypopnea Index (AHI): 3.9/h; RDI: 5.6 /h. REM AHI 6.9/h. REM RDI 9.2/h Average Oxygen Saturation: 94%; Lowest Oxygen Saturation: 92%.   While the HST by watch pat from 07-03-2018 was negative as well, AHI 3.9/h, RDI was 6.9 and REM AHI was 9.2/h., no clinically relevant oxygen desaturations were noted, leading to recommendations of weight loss and dental device for snoring treatment. She has asthma. She also endorsed a high degree of daytime sleepiness and fatigue, leading to this additional attended evaluation. The patient has now begun waking up from either gasping or snoring.  She feels that she barely sleeps at all. Also feels that her throat has been " closing up" and that it has been difficult to swallow. She continues to report to have an indurated and puffy appearing skin, her physical appearance has changed. 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 Anemia, anxiety, arthritis, asthma, chest pain, fibromyalgia, GERD, headache, hypertension, kidney stones, migraines, pneumonia and PONV  The patient endorsed the Epworth Sleepiness Scale at 8-10/24 points, FSS 16 points. BMI 36 kg/m2.   CURRENT MEDICATIONS: Flexaril and Albuterol were d/c for 14 days prior to this sleep study and can be  resumed.  Azelastine, Biotin, Budesonide-Formoterol, Vitamin D, Cyclobenzaprine, Hydrocodone-Acetaminophen, Levocetirizine, Montelukast, Multi-Vitamin, Omega-3 and Ondansetron.   PROCEDURE:  This is a multichannel digital polysomnogram utilizing the Somnostar 11.2 system.  Electrodes and sensors were applied and monitored per AASM Specifications.   EEG, EOG, Chin and Limb EMG, were sampled at 200 Hz.  ECG, Snore and Nasal Pressure, Thermal Airflow, Respiratory Effort, CPAP Flow and Pressure, Oximetry was sampled at 50 Hz. Digital video and audio were recorded.      BASELINE STUDY: Lights Out was at 22:43 and Lights On at 05:59. Total recording time (TRT) was 436.5 minutes, with a total sleep time (TST) of 368.5 minutes (over 6 hours).   The patient's sleep latency was 18 minutes. REM latency was 81 minutes.  The sleep efficiency was 84.2 %.     SLEEP ARCHITECTURE: WASO (Wake after sleep onset) was 51 minutes.  There were 31.5 minutes in Stage N1, 208.5 minutes Stage N2, 83 minutes Stage N3 and 44.5 minutes in Stage REM.  The percentage of Stage N1 was 8.6%, Stage N2 was 56.7%, Stage N3 was 22.6% and Stage R (REM sleep) was 12.1%.     RESPIRATORY ANALYSIS:  There were a total of 107 respiratory events:  16 obstructive apneas, 2 central apneas and 0 mixed apneas 89 hypopneas.  The patient also had few respiratory event related arousals (RERAs). The total APNEA/HYPOPNEA INDEX (AHI) was 17.5 /hour.  55 events occurred in REM sleep and 93 events in NREM.  The REM AHI was 74.2 /hour, versus a non-REM AHI  of 9.7. The patient spent 189 minutes of total sleep time in the supine position and 179 minutes in non-supine. The supine AHI was 8.2 versus a non-supine AHI of 27.2.  OXYGEN SATURATION & C02:  The Wake baseline 02 saturation was 96%, with the lowest being 77%. Time spent below 89% saturation equaled 29 minutes, or 12.6% of sleep time. .  AROUSALS:  The arousals were noted as: 22 were spontaneous, 0 were  associated with PLMs, and 37 were associated with respiratory events. The patient had a total of 0 Periodic Limb Movements.   Audio and video analysis did not show any abnormal or unusual movements, behaviors, phonations or vocalizations. The patient took bathroom breaks. Snoring was noted, loudest during supine sleep, while in N3 and REM sleep. EKG was in keeping with normal sinus rhythm (NSR). There were more movements and sleep fragmentation after 2 AM, but not PLMs.   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  Mostly OSA- Obstructive Sleep Apnea / Hypopnea (OSA) at AHI 17.5/h, REM AHI was 74.2/h.  1. No Periodic Limb Movement Disorder (PLMD). 2. Normal sinus rhythm EKG.  Post-study, the patient indicated that sleep was worse than at home.   RECOMMENDATIONS:  1. Advise full-night, attended, CPAP titration study to optimize therapy. The amount of REM dependence is not promising for dental / mandibular advancement therapy.  2. Restlessness in the morning hours was related to REM sleep apnea, hypoxemia and some spontaneous  arousals were RERAS.  3. The patient shall resume her medication regimen as before.    I certify that I have reviewed the entire raw data recording prior to the issuance of this report in accordance with the Standards of Accreditation of the American Academy of Sleep Medicine (AASM)   Larey Seat, MD    09-01-2018 Diplomat, American Board of Psychiatry and Neurology  Diplomat, American Board of [place] Director, [facility] at [facility] [employer]

## 2018-09-05 ENCOUNTER — Encounter: Payer: Self-pay | Admitting: Neurology

## 2018-09-05 ENCOUNTER — Telehealth: Payer: Self-pay | Admitting: Neurology

## 2018-09-05 NOTE — Telephone Encounter (Signed)
-----   Message from Larey Seat, MD sent at 09/03/2018  6:15 PM EST ----- I was surprised, but OSA was found- this only 2 month after a negative HST. It was confirmed that the patient indeed slept over 6 hours ( as in HST) and has snored, has this time enough OSA to warrant treatment.   IMPRESSION: Organic Sleep disorders were found and no follow up MSLT was performed.  Mostly OSA- Obstructive Sleep Apnea / Hypopnea (OSA) at AHI 17.5/h, REM AHI was 74.2/h.  1. No Periodic Limb Movement Disorder (PLMD). 2. Normal sinus rhythm EKG.  Post-study, the patient indicated that sleep was worse than at  home.   RECOMMENDATIONS:  1. Advise full-night, attended, CPAP titration study to optimize  therapy. The amount of REM dependence is not promising for dental  / mandibular advancement therapy.  2. Restlessness in the morning hours was related to REM sleep  apnea, hypoxemia and some spontaneous arousals were RERAS.  3. The patient shall resume her medication regimen as before.

## 2018-09-05 NOTE — Telephone Encounter (Signed)
Called patient to discuss sleep study results. No answer at this time. LVM for the patient to call back.   

## 2018-09-12 NOTE — Telephone Encounter (Signed)

## 2018-09-25 ENCOUNTER — Ambulatory Visit (INDEPENDENT_AMBULATORY_CARE_PROVIDER_SITE_OTHER): Payer: PRIVATE HEALTH INSURANCE | Admitting: Neurology

## 2018-09-25 DIAGNOSIS — G4733 Obstructive sleep apnea (adult) (pediatric): Secondary | ICD-10-CM | POA: Diagnosis not present

## 2018-09-25 DIAGNOSIS — R0683 Snoring: Secondary | ICD-10-CM

## 2018-09-30 NOTE — Addendum Note (Signed)
Addended by: Larey Seat on: 09/30/2018 12:21 PM   Modules accepted: Orders

## 2018-09-30 NOTE — Procedures (Signed)
PATIENT'S NAME:     [name] [name], [name] [name] DOB:      Dec 04, 1965      MRN#:    [id number]     DATE OF RECORDING: 08/30/2018 REFERRING M.D.:  Marton Redwood, MD Study Performed:    JKDTOIZT AIRWAY PRESSURE TITRATION HISTORY:  [name] [name] returned after her HST from 12-202019 and a PSG performed on 08/30/18 demonstrated primarily obstructive sleep apnea, with an AHI of 17.5, a REM AHI of 74.2/h, 29 minutes of total oxygen desaturation time and Nadir SpO2 of 77%.  The patient endorsed the Epworth Sleepiness Scale at 8/24 points.   The patient's weight 216 pounds with a height of 65 (inches), resulting in a BMI of 36.0 kg/m2. The patient's neck circumference measured 13.5 inches.  CURRENT MEDICATIONS:  Biotin, Budesonide-Formoterol, Vitamin D, Cyclobenzaprine, Hydrocodone-Acetaminophen, Levocetirizine, Montelukast, Multi-Vitamin, Omega-3 and Ondansetron.   PROCEDURE:  This is a multichannel digital polysomnogram utilizing the SomnoStar 11.2 system.  Electrodes and sensors were applied and monitored per AASM Specifications.   EEG, EOG, Chin and Limb EMG, were sampled at 200 Hz.  ECG, Snore and Nasal Pressure, Thermal Airflow, Respiratory Effort, CPAP Flow and Pressure, Oximetry was sampled at 50 Hz. Digital video and audio were recorded.      The patient was fitted by [name] with an AirFit P 10 nasal pillow by [employer] in small size. CPAP was initiated at 5 cmH20 with heated humidity per AASM split night standards and pressure was advanced to 7 cmH20 because of hypopneas, apneas and desaturations.  At a PAP pressure of 7 cmH20, there was a reduction of the AHI to 0.0/h with improvement of snoring and sleep apnea in supine and lateral sleep. The patient slept 81.5 minutes, with 92% sleep efficiency.  Lights Out was at 22:43 and Lights On at 05:59. Total recording time (TRT) was 365 minutes, with a total sleep time (TST) of 282 minutes. The patient's sleep latency was 58.5 minutes. REM latency was 80 minutes.   The sleep efficiency was 77.2 %.    SLEEP ARCHITECTURE: WASO (Wake after sleep onset) was 51 minutes.  There were 31.5 minutes in Stage N1, 208.5 minutes Stage N2, 83 minutes Stage N3 and 44.5 minutes in Stage REM.  The percentage of Stage N1 was 8.6%, Stage N2 was 56.7%, Stage N3 was 22.6% and Stage R (REM sleep) was 12.1%. The sleep architecture was notable for high fragmentation within the first hour of sleep, which improved rapidly.  RESPIRATORY ANALYSIS:  There was a total of 0 respiratory events: 0 apneas and 0 hypopneas with 0 respiratory event related arousals (RERAs).The total APNEA/HYPOPNEA INDEX (AHI) was 0 /hour. 0 events occurred in REM sleep and 0 events in NREM. The REM AHI was 0 /hour versus a non-REM AHI of 0.0 /hour.  The patient spent 189 minutes of total sleep time in the supine position and 179 minutes in non-supine. The supine AHI was 0.0, versus a non-supine AHI of 0.0/h.  OXYGEN SATURATION & C02:  The baseline 02 saturation was 92%, with the lowest being 85%. Time spent below 89% saturation equaled 0.1 minutes.  AROUSALS:  The arousals were noted as: 22 were spontaneous, 0 were associated with PLMs, and 37 were associated with respiratory events. The patient had a total of 0 Periodic Limb Movements.  Audio and video analysis did not show any abnormal or unusual movements, behaviors, phonations or vocalizations.   The patient did not take bathroom breaks. Mild Snoring was noted. EKG was in keeping with normal  sinus rhythm (NSR).   DIAGNOSIS 1. Snoring and mild- moderate Obstructive Sleep Apnea, responding to CPAP at 6 and 7 cm water pressure.  2. No clinically significant Sleep Related Hypoxemia. 3. 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  4. Normal EKG   PLANS/RECOMMENDATIONS: start using CPAP with autotitration capability, set between 4 and 8 cm water, 1 cm EPR, mask of patients choice. The patient was fitted with an AirFit P 10 nasal pillow by [employer] in small size.  Post-study, the patient indicated that sleep was better than usual, her mouth less dry.   CPAP therapy compliance is defined as 4 hours or more with nightly use.    A follow up appointment will be scheduled in the Sleep Clinic at [facility].   Please call [phone] with any questions.      I certify that I have reviewed the entire raw data recording prior to the issuance of this report in accordance with the Standards of Accreditation of the American Academy of Sleep Medicine (AASM)    Larey Seat, M.D.  09-30-2018 Diplomat, American Board of Psychiatry and Neurology  Diplomat, [place] [employer] [facility] Medical Director, [place] [facility]

## 2018-10-02 ENCOUNTER — Telehealth: Payer: Self-pay | Admitting: Neurology

## 2018-10-02 NOTE — Telephone Encounter (Signed)
-----   Message from Larey Seat, MD sent at 09/30/2018 12:21 PM EST ----- DIAGNOSIS 1. Snoring and mild- moderate Obstructive Sleep Apnea, responding  to CPAP at 6 and 7 cm water pressure.  2. No clinically significant Sleep Related Hypoxemia. 3. 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  4. Normal EKG   PLANS/RECOMMENDATIONS: start using CPAP with autotitration  capability, set between 4 and 8 cm water, 1 cm EPR, mask of  patients choice. The patient was fitted with an AirFit P 10 nasal  pillow by [employer] in small size. Post-study, the patient indicated  that sleep was better than usual, her mouth less dry.  CPAP therapy compliance is defined as 4 hours or more with  nightly use.

## 2018-10-02 NOTE — Telephone Encounter (Signed)
I called [name] [name]. I advised [name] [name] that Dr. [name] reviewed their sleep study results and found that [name] [name] has sleep apnea that was treated with CPAP. Dr. [name] recommends that [name] [name] starts auto CPAP 4-8 cm water pressure. I reviewed PAP compliance expectations with the [name] [name]. [name] [name] is agreeable to starting a CPAP. I advised [name] [name] that an order will be sent to a DME, [facility], and [facility] will call the [name] [name] within about one week after they file with the [name] [name]'s insurance. [facility] will show the [name] [name] how to use the machine, fit for masks, and troubleshoot the CPAP if needed. A follow up appt was made for insurance purposes with Dr. [name] on May 19,2020 at 8:30 am. [name] [name] verbalized understanding to arrive 15 minutes early and bring their CPAP. A letter with all of this information in it will be mailed to the [name] [name] as a reminder. I verified with the [name] [name] that the address we have on file is correct. [name] [name] verbalized understanding of results. [name] [name] had no questions at this time but was encouraged to call back if questions arise. I have sent the order to [facility] and have received confirmation that they have received the order.

## 2018-11-01 ENCOUNTER — Emergency Department (HOSPITAL_COMMUNITY)
Admission: EM | Admit: 2018-11-01 | Discharge: 2018-11-01 | Disposition: A | Discharge status: Home / Self Care | Payer: PRIVATE HEALTH INSURANCE | Attending: Emergency Medicine | Admitting: Emergency Medicine

## 2018-11-01 ENCOUNTER — Other Ambulatory Visit: Payer: Self-pay

## 2018-11-01 ENCOUNTER — Emergency Department (HOSPITAL_COMMUNITY): Payer: PRIVATE HEALTH INSURANCE

## 2018-11-01 ENCOUNTER — Encounter (HOSPITAL_COMMUNITY): Payer: Self-pay | Admitting: Emergency Medicine

## 2018-11-01 DIAGNOSIS — Z79899 Other long term (current) drug therapy: Secondary | ICD-10-CM | POA: Insufficient documentation

## 2018-11-01 DIAGNOSIS — R197 Diarrhea, unspecified: Secondary | ICD-10-CM | POA: Diagnosis not present

## 2018-11-01 DIAGNOSIS — J45909 Unspecified asthma, uncomplicated: Secondary | ICD-10-CM | POA: Diagnosis not present

## 2018-11-01 DIAGNOSIS — I10 Essential (primary) hypertension: Secondary | ICD-10-CM | POA: Insufficient documentation

## 2018-11-01 DIAGNOSIS — R079 Chest pain, unspecified: Secondary | ICD-10-CM | POA: Insufficient documentation

## 2018-11-01 LAB — CBC
HCT: 44.4 % (ref 36.0–46.0)
Hemoglobin: 14 g/dL (ref 12.0–15.0)
MCH: 30.2 pg (ref 26.0–34.0)
MCHC: 31.5 g/dL (ref 30.0–36.0)
MCV: 95.9 fL (ref 80.0–100.0)
Platelets: 276 10*3/uL (ref 150–400)
RBC: 4.63 MIL/uL (ref 3.87–5.11)
RDW: 12.7 % (ref 11.5–15.5)
WBC: 8.8 10*3/uL (ref 4.0–10.5)
nRBC: 0 % (ref 0.0–0.2)

## 2018-11-01 LAB — BASIC METABOLIC PANEL
Anion gap: 11 (ref 5–15)
BUN: 14 mg/dL (ref 6–20)
CO2: 25 mmol/L (ref 22–32)
Calcium: 8.9 mg/dL (ref 8.9–10.3)
Chloride: 105 mmol/L (ref 98–111)
Creatinine, Ser: 0.91 mg/dL (ref 0.44–1.00)
GFR calc Af Amer: >60 mL/min (ref >60)
GFR calc non Af Amer: >60 mL/min (ref >60)
Glucose, Bld: 90 mg/dL (ref 70–99)
Potassium: 3.6 mmol/L (ref 3.5–5.1)
Sodium: 141 mmol/L (ref 135–145)

## 2018-11-01 LAB — HEPATIC FUNCTION PANEL
ALT: 25 U/L (ref 0–44)
AST: 22 U/L (ref 15–41)
Albumin: 4.5 g/dL (ref 3.5–5.0)
Alkaline Phosphatase: 81 U/L (ref 38–126)
Bilirubin, Direct: 0.1 mg/dL (ref 0.0–0.2)
Indirect Bilirubin: 0.6 mg/dL (ref 0.3–0.9)
Total Bilirubin: 0.7 mg/dL (ref 0.3–1.2)
Total Protein: 7.6 g/dL (ref 6.5–8.1)

## 2018-11-01 LAB — TROPONIN I: Troponin I: <0.03 ng/mL (ref <0.03)

## 2018-11-01 LAB — I-STAT BETA HCG BLOOD, ED (MC, WL, AP ONLY): I-stat hCG, quantitative: 7 m[IU]/mL — ABNORMAL HIGH (ref <5)

## 2018-11-01 LAB — LACTATE DEHYDROGENASE: LDH: 186 U/L (ref 98–192)

## 2018-11-01 LAB — D-DIMER, QUANTITATIVE: D-Dimer, Quant: 0.32 ug/mL-FEU (ref 0.00–0.50)

## 2018-11-01 NOTE — ED Notes (Signed)
Pt transported to xray at this time

## 2018-11-01 NOTE — ED Notes (Signed)
Pt returning to Xray

## 2018-11-01 NOTE — ED Provider Notes (Signed)
[facility] DEPT Provider Note   CSN: [id number] Arrival date & time: 11/01/18  [place]    History   Chief Complaint Chief Complaint  Patient presents with  . Chest Pain  . Cough  . Diarrhea    HPI [name] [name] is a 53 y.o. female.     HPI  Patient been ill for couple of weeks with various symptoms including chest discomfort which is a "burning sensation," wheezing, decreased oral intake, diarrhea, anxiousness and periods of trouble breathing.  No known sick exposures.  Her husband was under quarantine for possible Covid-19 exposure.  He tested negative, and was never ill with a aspiratory infection  or fever.     Past Medical History:  Diagnosis Date  . Anemia    many years ago  . Anxiety    situational  . Arthritis    "hands" (07/12/2012)  . Asthma    seasonal   . Chest pain   . Chest pain at rest, on going 07/12/2012  . Family history of early CAD 07/12/2012  . Fibromyalgia   . GERD (gastroesophageal reflux disease)   . Headache(784.0)    "often; not daily" (07/12/2012)  . Hypertension    not on medications  . Kidney stones   . Migraine   . Pneumonia    as a child  . PONV (postoperative nausea and vomiting)    she states she gets very sick    Patient Active Problem List   Diagnosis Date Noted  . Lipoedema 03/14/2017  . Blurring of vision 03/14/2017  . Class 2 obesity with body mass index (BMI) of 38.0 to 38.9 in adult 03/14/2017  . Fibromyalgia 11/09/2016  . History of migraine 11/09/2016  . History of environmental allergies 11/09/2016  . Abdominal apron 10/21/2016  . Myalgia 05/13/2016  . Adjustment insomnia 05/13/2016  . Snoring 05/13/2016  . 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 Chest pain at rest, on going, probable GI source 07/12/2012  . HTN (hypertension) 07/12/2012  . Family history of early CAD 07/12/2012  . Anxiety 07/12/2012    Past Surgical History:  Procedure Laterality Date  . breast lift    .  CARDIAC CATHETERIZATION  07/11/2012  . CHOLECYSTECTOMY N/A 09/18/2015   Procedure: LAPAROSCOPIC CHOLECYSTECTOMY;  Surgeon: Coralie Keens, MD;  Location: [facility];  Service: General;  Laterality: N/A;  . LEFT HEART CATHETERIZATION WITH CORONARY ANGIOGRAM N/A 07/11/2012   Procedure: LEFT HEART CATHETERIZATION WITH CORONARY ANGIOGRAM;  Surgeon: Lorretta Harp, MD;  Location: [facility] CATH LAB;  Service: Cardiovascular;  Laterality: N/A;  . REDUCTION MAMMAPLASTY Bilateral 10+ years ago  . TONSILLECTOMY  ~ 1976  . tubes and ovaries removed  2015  . VAGINAL HYSTERECTOMY  ~ 2009     OB History   No obstetric history on file.      Home Medications    Prior to Admission medications   Medication Sig Start Date End Date Taking? Authorizing Provider  albuterol (PROAIR HFA) 108 (90 Base) MCG/ACT inhaler Inhale 2 puffs into the lungs 2 (two) times daily as needed for wheezing or shortness of breath.   Yes [provider]  cyclobenzaprine (FLEXERIL) 10 MG tablet Take 10 mg by mouth at bedtime as needed for muscle spasms.    Yes [provider]  dextromethorphan-guaiFENesin (MUCINEX DM) 30-600 MG 12hr tablet Take 1 tablet by mouth 2 (two) times daily.   Yes [provider]  finasteride (PROSCAR) 5 MG tablet Take 2.5 mg by mouth daily. 09/27/18  Yes [provider]  levocetirizine (XYZAL) 5 MG tablet Take 5 mg by mouth every morning.    Yes [provider]  montelukast (SINGULAIR) 10 MG tablet Take 10 mg by mouth daily.   Yes [provider]  Multiple Vitamins-Calcium (ONE-A-DAY WOMENS PO) Take 1 tablet by mouth daily.   Yes [provider]  Omega-3 Fatty Acids (FISH OIL PO) Take 1 tablet by mouth daily.   Yes [provider]  RABEprazole (ACIPHEX) 20 MG tablet Take 20 mg by mouth 2 (two) times daily.   Yes [provider]  rizatriptan (MAXALT-MLT) 10 MG disintegrating tablet Take 10 mg by mouth every 2 (two) hours as needed for  migraine.  06/18/15  Yes [provider]  Vitamin D, Ergocalciferol, (DRISDOL) 50000 units CAPS capsule Take 50,000 Units by mouth every Wednesday.    Yes [provider]  EPINEPHrine (EPIPEN 2-PAK) 0.3 mg/0.3 mL IJ SOAJ injection Inject 0.3 mLs (0.3 mg total) into the muscle as needed. Patient taking differently: Inject 0.3 mg into the muscle once as needed (for anaphylactic reactions).  01/30/18   Carmin Muskrat, MD  Estradiol 0.75 MG/1.25 GM (0.06%) topical gel Place 1.25 g onto the skin daily. Patient not taking: Reported on 06/05/2018 04/25/18   Dohmeier, Asencion Partridge, MD  HYDROcodone-Acetaminophen (LORTAB) 10-300 MG/15ML SOLN Take 11.25 mLs by mouth every 4 (four) hours as needed (for pain).     [provider]  naproxen (NAPROSYN) 250 MG tablet Take 1 tablet (250 mg total) by mouth 2 (two) times daily as needed for mild pain or moderate pain (take with food). Patient not taking: Reported on 06/05/2018 08/19/17   Francine Graven, DO  ondansetron (ZOFRAN ODT) 4 MG disintegrating tablet Take 1 tablet (4 mg total) by mouth every 8 (eight) hours as needed for nausea or vomiting. 08/19/17   Francine Graven, DO  promethazine (PHENERGAN) 12.5 MG tablet Take 12.5 mg by mouth every 4 (four) hours as needed. 09/27/18   [provider]    Family History Family History  Problem Relation Age of Onset  . Hiatal hernia Mother   . Hypertension Mother   . Depression Mother   . Heart disease Father   . Stroke Maternal Grandfather   . Chronic Renal Failure Paternal Grandmother   . Drug abuse Son     Social History Social History   Tobacco Use  . Smoking status: Never Smoker  . Smokeless tobacco: Never Used  Substance Use Topics  . Alcohol use: No    Frequency: Never  . Drug use: No     Allergies   Contrast media [iodinated diagnostic agents]; Morphine and related; Prednisone; Yellow jacket venom [bee venom]; Azithromycin; Erythromycin; Iohexol; Oxycontin  [oxycodone]; Avelox [moxifloxacin hcl in nacl]; Codeine; Gadolinium derivatives; Levaquin [levofloxacin in d5w]; Oxycodone-acetaminophen; Pantoprazole; Augmentin [amoxicillin-pot clavulanate]; Dilaudid [hydromorphone hcl]; Oxycodone hcl; and Tramadol   Review of Systems Review of Systems  All other systems reviewed and are negative.    Physical Exam Updated Vital Signs BP (!) 149/97   Pulse (!) 103   Temp 98.4 F (36.9 C) (Oral)   Resp (!) 22   Ht 5\' 5"  (1.651 m)   Wt 88.5 kg   SpO2 99%   BMI 32.45 kg/m   Physical Exam Vitals signs and nursing note reviewed.  Constitutional:      General: She is not in acute distress.    Appearance: She is well-developed. She is not ill-appearing, toxic-appearing or diaphoretic.  HENT:  Head: Normocephalic and atraumatic.     Right Ear: External ear normal.     Left Ear: External ear normal.  Eyes:     Conjunctiva/sclera: Conjunctivae normal.     Pupils: Pupils are equal, round, and reactive to light.  Neck:     Musculoskeletal: Normal range of motion and neck supple.     Trachea: Phonation normal.  Cardiovascular:     Rate and Rhythm: Regular rhythm. Tachycardia present.     Heart sounds: Normal heart sounds.  Pulmonary:     Effort: Pulmonary effort is normal. No respiratory distress.     Breath sounds: Normal breath sounds. No stridor. No rhonchi.  Abdominal:     Palpations: Abdomen is soft. There is no mass.     Tenderness: There is no abdominal tenderness.  Musculoskeletal: Normal range of motion.        General: No swelling, tenderness or signs of injury.  Skin:    General: Skin is warm and dry.  Neurological:     Mental Status: She is alert and oriented to person, place, and time.     Cranial Nerves: No cranial nerve deficit.     Sensory: No sensory deficit.     Motor: No abnormal muscle tone.     Coordination: Coordination normal.  Psychiatric:        Mood and Affect: Mood normal.        Behavior: Behavior normal.         Thought Content: Thought content normal.        Judgment: Judgment normal.      ED Treatments / Results  Labs (all labs ordered are listed, but only abnormal results are displayed) Labs Reviewed  I-STAT BETA HCG BLOOD, ED ([facility], [facility], [facility] ONLY) - Abnormal; Notable for the following components:      Result Value   I-stat hCG, quantitative 7.0 (*)    All other components within normal limits  BASIC METABOLIC PANEL  CBC  TROPONIN I  D-DIMER, QUANTITATIVE (NOT AT [facility])  HEPATIC FUNCTION PANEL  LACTATE DEHYDROGENASE    EKG EKG Interpretation  Date/Time:  Wednesday November 01 2018 19:18:53 EDT Ventricular Rate:  96 PR Interval:    QRS Duration: 84 QT Interval:  355 QTC Calculation: 449 R Axis:   10 Text Interpretation:  Sinus rhythm since last tracing no significant change Confirmed by Daleen Bo [phone]) on 11/01/2018 7:54:18 PM   Radiology Dg Chest 2 View  Result Date: 11/01/2018 CLINICAL DATA:  53 year old female with chest pain. EXAM: CHEST - 2 VIEW COMPARISON:  Chest radiograph dated 02/24/2018 FINDINGS: There is no focal consolidation, pleural effusion, or pneumothorax. The cardiac silhouette is within normal limits. No acute osseous pathology. IMPRESSION: No active cardiopulmonary disease. Electronically Signed   By: Anner Crete M.D.   On: 11/01/2018 20:02    Procedures Procedures (including critical care time)  Medications Ordered in ED Medications - No data to display   Initial Impression / Assessment and Plan / ED Course  I have reviewed the triage vital signs and the nursing notes.  Pertinent labs & imaging results that were available during my care of the patient were reviewed by me and considered in my medical decision making (see chart for details).  Clinical Course as of Nov 01 2138  Wed Nov 01, 2018  2059 Mild nonspecific elevation  I-Stat beta hCG blood, ED(!) [EW]  2059 Normal  Lactate dehydrogenase [EW]  2059 Normal  Hepatic function panel  [EW]  2059  Normal  Troponin I - ONCE - STAT [EW]  2059 Normal  Basic metabolic panel [EW]  6599 Normal  CBC [EW]  2100 No infiltrate or CHF, images reviewed by me  DG Chest 2 View [EW]    Clinical Course User Index [EW] Daleen Bo, MD        Patient Vitals for the past 24 hrs:  BP Temp Temp src Pulse Resp SpO2 Height Weight  11/01/18 2101 (!) 149/97 98.4 F (36.9 C) Oral (!) 103 (!) 22 99 % - -  11/01/18 1859 (!) 166/115 99.1 F (37.3 C) Oral (!) 110 17 99 % 5\' 5"  (1.651 m) 88.5 kg    9:40 PM Reevaluation with update and discussion. After initial assessment and treatment, an updated evaluation reveals she is somewhat calmer at this time.  Blood pressure is improved somewhat spontaneously.  She reports having high blood pressure in the past, not currently being treated.  She continues to be concerned about having an infection.  She again discusses ongoing chest pain present for several months.  We discussed the findings and agreed on the plan.  All questions were answered. Daleen Bo   Medical Decision Making: Nonspecific symptoms.  Not ACS, PE or pneumonia.  Doubt Covid-19 infection.  No indication for hospitalization, or further ED intervention at this time.  [name] [name] was evaluated in Emergency Department on 11/01/2018 for the symptoms described in the history of present illness. She was evaluated in the context of the global COVID-19 pandemic, which necessitated consideration that the patient might be at risk for infection with the SARS-CoV-2 virus that causes COVID-19. Institutional protocols and algorithms that pertain to the evaluation of patients at risk for COVID-19 are in a state of rapid change based on information released by regulatory bodies including the CDC and federal and state organizations. These policies and algorithms were followed during the patient's care in the ED.  CRITICAL CARE-no Performed by: Daleen Bo  Nursing Notes Reviewed/ Care  Coordinated Applicable Imaging Reviewed Interpretation of Laboratory Data incorporated into ED treatment  The patient appears reasonably screened and/or stabilized for discharge and I doubt any other medical condition or other [facility] requiring further screening, evaluation, or treatment in the ED at this time prior to discharge.  Plan: Home Medications-continue usual; Home Treatments-rest, fluids; return here if the recommended treatment, does not improve the symptoms; Recommended follow up-PCP checkup 1 to 2 weeks and as needed   Final Clinical Impressions(s) / ED Diagnoses   Final diagnoses:  Nonspecific chest pain  Diarrhea, unspecified type  Hypertension, unspecified type    ED Discharge Orders    None       Daleen Bo, MD 11/01/18 2142

## 2018-11-01 NOTE — Discharge Instructions (Addendum)
There are no signs for heart damage, pneumonia, serious viral problems, unusual gastrointestinal disorders, or other infectious problems.  Make sure that you are getting plenty of rest and drink a lot of fluids.  Stay on a low fiber diet, for now.  You can use Tylenol for chest discomfort.  If you are feeling short of breath or coughing, use your albuterol inhaler 2 or 3 times a day.  See your doctor for further care and evaluation in a week or 2.  Follow your blood pressure at home, by checking it once a day; hopefully in the mornings.  There is no sign that you need to quarantine yourself from your family, at this time.  Return here, if needed, for problems.

## 2018-11-01 NOTE — ED Triage Notes (Addendum)
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 Pt reports he was negative for Covid-19 after being exposed to a positive patient. Pt c/o chest pains that is pressure and burning and sore throat for 4 days. Reports called her PCP on Friday who advised to take Mucinex. Pt reports she has Had cough for several weeks that is dry. Reports that she has aches in her back and under the ribs. Pt reports diarrhea started today.

## 2018-11-01 NOTE — ED Notes (Signed)
Pt back from x-ray.

## 2018-11-01 NOTE — ED Notes (Signed)
EDP Wentz at bedside 

## 2018-11-02 ENCOUNTER — Encounter: Payer: Self-pay | Admitting: Neurology

## 2018-11-03 ENCOUNTER — Encounter: Payer: Self-pay | Admitting: Neurology

## 2018-11-03 DIAGNOSIS — M791 Myalgia, unspecified site: Secondary | ICD-10-CM

## 2018-11-03 DIAGNOSIS — R635 Abnormal weight gain: Secondary | ICD-10-CM

## 2018-11-03 DIAGNOSIS — Z9989 Dependence on other enabling machines and devices: Secondary | ICD-10-CM

## 2018-11-03 DIAGNOSIS — G4733 Obstructive sleep apnea (adult) (pediatric): Secondary | ICD-10-CM

## 2018-11-03 MED ORDER — MOMETASONE FUROATE 50 MCG/ACT NA SUSP: 2.0000 | Freq: Every day | NASAL | 12 refills | Status: DC

## 2018-11-13 ENCOUNTER — Telehealth: Payer: Self-pay | Admitting: Neurology

## 2018-11-13 NOTE — Telephone Encounter (Signed)
Due to current COVID 19 pandemic, our office is severely reducing in office visits until further notice, in order to minimize the risk to our patients and healthcare providers.   Called patient to offer a sooner appointment via virtual visit. Patient accepted. Patient verbalized understanding of the process of setting up webex on her device. Patient understands that she will receive an e-mail with further instruction. I advised patient she will receive a phone call from nurse Myriam Jacobson to update chart history prior to appointment.  Pt understands that although there may be some limitations with this type of visit, we will take all precautions to reduce any security or privacy concerns.  Pt understands that this will be treated like an in office visit and we will file with pt's insurance, and there may be a patient responsible charge related to this service.  Pt's email is [email or web address]. Pt understands that the [employer] webex software must be downloaded and operational on the device pt plans to use for the visit.

## 2018-11-20 ENCOUNTER — Ambulatory Visit: Payer: Self-pay | Admitting: Neurology

## 2018-11-20 NOTE — Telephone Encounter (Signed)
Pt called in and stated she has been up sick all night and needs to cancel her appt , pt is willing to r/s appt

## 2018-11-28 ENCOUNTER — Other Ambulatory Visit: Payer: Self-pay | Admitting: Neurology

## 2018-11-28 ENCOUNTER — Ambulatory Visit (INDEPENDENT_AMBULATORY_CARE_PROVIDER_SITE_OTHER): Payer: PRIVATE HEALTH INSURANCE | Admitting: Neurology

## 2018-11-28 ENCOUNTER — Other Ambulatory Visit: Payer: Self-pay

## 2018-11-28 ENCOUNTER — Encounter: Payer: Self-pay | Admitting: Neurology

## 2018-11-28 DIAGNOSIS — G4452 New daily persistent headache (NDPH): Secondary | ICD-10-CM | POA: Diagnosis not present

## 2018-11-28 DIAGNOSIS — H9319 Tinnitus, unspecified ear: Secondary | ICD-10-CM | POA: Diagnosis not present

## 2018-11-28 DIAGNOSIS — Z9989 Dependence on other enabling machines and devices: Secondary | ICD-10-CM

## 2018-11-28 DIAGNOSIS — G4733 Obstructive sleep apnea (adult) (pediatric): Secondary | ICD-10-CM | POA: Diagnosis not present

## 2018-11-28 DIAGNOSIS — E220 Acromegaly and pituitary gigantism: Secondary | ICD-10-CM | POA: Insufficient documentation

## 2018-11-28 NOTE — Patient Instructions (Signed)
Tinnitus  Tinnitus refers to hearing a sound when there is no actual source for that sound. This is often described as ringing in the ears. However, people with this condition may hear a variety of noises, in one ear or in both ears.  The sounds of tinnitus can be soft, loud, or somewhere in between. Tinnitus can last for a few seconds or can be constant for days. It may go away without treatment and come back at various times. When tinnitus is constant or happens often, it can lead to other problems, such as trouble sleeping and trouble concentrating.  Almost everyone experiences tinnitus at some point. Tinnitus that is long-lasting (chronic) or comes back often (recurs) may require medical attention.  What are the causes?  The cause of tinnitus is often not known. In some cases, it can result from other problems or conditions, including:  · Exposure to loud noises from machinery, music, or other sources.  · Hearing loss.  · Ear or sinus infections.  · Earwax buildup.  · An object (foreign body) stuck in the ear.  · Taking certain medicines.  · Drinking alcohol or caffeine.  · High blood pressure.  · Heart diseases.  · Anemia.  · Allergies.  · Meniere's disease.  · Thyroid problems.  · Tumors.  · A weak, bulging blood vessel (aneurysm) near the ear.  · Depression or other mood disorders.  What are the signs or symptoms?  The main symptom of tinnitus is hearing a sound when there is no source for that sound. It may sound like:  · Buzzing.  · Roaring.  · Ringing.  · Blowing air, like the sound heard when you listen to a seashell.  · Hissing.  · Whistling.  · Sizzling.  · Humming.  · Running water.  · A musical note.  · Tapping.  Symptoms may affect only one ear (unilateral) or both ears (bilateral).  How is this diagnosed?  Tinnitus is diagnosed based on your symptoms, your medical history, and a physical exam. Your health care provider may do a thorough hearing test (audiologic exam) if your tinnitus:  · Is  unilateral.  · Causes hearing difficulties.  · Lasts 6 months or longer.  You may work with a health care provider who specializes in hearing disorders (audiologist). You may be asked questions about your symptoms and how they affect your daily life. You may have other tests done, such as:  · CT scan.  · MRI.  · An imaging test of how blood flows through your blood vessels (angiogram).  How is this treated?  Treating an underlying medical condition can sometimes make tinnitus go away. If your tinnitus continues, other treatments may include:  · Medicines, such as antidepressants or sleeping aids.  · Sound generators to mask the tinnitus. These include:  ? Tabletop sound machines that play relaxing sounds to help you fall asleep.  ? Wearable devices that fit in your ear and play sounds or music.  ? Acoustic neural stimulation. This involves using headphones to listen to music that contains an auditory signal. Over time, listening to this signal may change some pathways in your brain and make you less sensitive to tinnitus. This treatment is used for very severe cases when no other treatment is working.  · Therapy and counseling to help you manage the stress of living with tinnitus.  · Using hearing aids or cochlear implants if your tinnitus is related to hearing loss. Hearing aids are worn in   the outer ear. Cochlear implants are surgically placed in the inner ear.  Follow these instructions at home:  Managing symptoms         · When possible, avoid being in loud places and being exposed to loud sounds.  · Wear hearing protection, such as earplugs, when you are exposed to loud noises.  · Use a white noise machine, a humidifier, or other devices to mask the sound of tinnitus.  · Practice techniques for reducing stress, such as meditation, yoga, or deep breathing. Work with your health care provider if you need help with managing stress.  · Sleep with your head slightly raised. This may reduce the impact of  tinnitus.  General instructions  · Do not use stimulants, such as nicotine, alcohol, or caffeine. Talk with your health care provider about other stimulants to avoid. Stimulants are substances that can make you feel alert and attentive by increasing certain activities in the body (such as heart rate and blood pressure). These substances may make tinnitus worse.  · Take over-the-counter and prescription medicines only as told by your health care provider.  · Try to get plenty of sleep each night.  · Keep all follow-up visits as told by your health care provider. This is important.  Contact a health care provider if:  · Your tinnitus continues for 3 weeks or longer without stopping.  · Your symptoms get worse or do not get better with home care.  · You develop tinnitus after a head injury.  · You have tinnitus along with any of the following:  ? Dizziness.  ? Loss of balance.  ? Nausea and vomiting.  Summary  · Tinnitus refers to hearing a sound when there is no actual source for that sound. This is often described as ringing in the ears.  · Symptoms may affect only one ear (unilateral) or both ears (bilateral).  · Use a white noise machine, a humidifier, or other devices to mask the sound of tinnitus.  · Do not use stimulants, such as nicotine, alcohol, or caffeine. Talk with your health care provider about other stimulants to avoid. These substances may make tinnitus worse.  This information is not intended to replace advice given to you by your health care provider. Make sure you discuss any questions you have with your health care provider.  Document Released: 07/19/2005 Document Revised: 04/28/2017 Document Reviewed: 04/28/2017  Elsevier Interactive Patient Education © 2019 [employer]

## 2018-11-28 NOTE — Progress Notes (Signed)
Virtual Visit via Video Note  I connected with [name] [name] on 11/28/18 at 10:30 AM EDT by a video enabled telemedicine application and verified that I am speaking with the correct person using two identifiers.   I discussed the limitations of evaluation and management by telemedicine and the availability of in person appointments. The patient expressed understanding and agreed to proceed.  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 Retro-orbital pressure.  The patient reports that her presumed diagnosis of Dupuytren's disease has progressed and has led to hair loss.  She saw a [facility] dermatology resident physician who prescribed finasteride and a topical minoxidil for her she was only asked to return in 6 months if it did not help".  I was under the impression that finasteride is not used in female patients.  At the same time she started finasteride she was also starting on CPAP and she reports that she developed tinnitus which is actually not uncommon as a side effect of finasteride.  14 days ago she stopped finasteride and yet her tinnitus has continued.  She also has nasal septal deviation her right nausea on is often occluded.  Her nose is stuffy every morning after using CPAP and she often wakes up with a terrible headache.  She has also had recently spikes and hypertension.  But she had not been using allergy medication for a while she is back on Xyzal and Singulair.  She has called Dr. Constance Holster, ENT and she is going to see his office soon.  The benefit of CPAP therapy has been that she is by far less tired and much more energetic and that her Epworth now is endorsed at 5 out of 24 points a significant decrease.  She no longer snores and she gets about 5 hours of sleep each night.  The  download shows a 87% compliance with a residual AHI of 2.5/h and a 90 percentile pressure of 7.2 cm.  Humidifier is set to 2 she is using a ramp time of 0 minutes.       Observations/Objective: [id number]     DATE OF RECORDING: 08/30/2018 REFERRING M.D.:  Marton Redwood, MD Study Performed:    POSITIVE AIRWAY PRESSURE TITRATION HISTORY:  [name] [name] returned after her HST from [id number] and a PSG performed on 08/30/18 demonstrated primarily obstructive sleep apnea, with an AHI of 17.5, a REM AHI of 74.2/h, 29 minutes of total oxygen desaturation time and Nadir SpO2 of 77%.  The patient endorsed the Epworth Sleepiness Scale at 8/24 points.   The patient's weight 216 pounds with a height of 65 (inches), resulting in a BMI of 36.0 kg/m2. The patient's neck circumference measured 13.5 inches.  CURRENT MEDICATIONS:  Biotin, Budesonide-Formoterol, Vitamin D, Cyclobenzaprine, Hydrocodone-Acetaminophen, Levocetirizine, Montelukast, Multi-Vitamin, Omega-3 and Ondansetron.   PROCEDURE:  This is a multichannel digital polysomnogram utilizing the SomnoStar 11.2 system.  Electrodes and sensors were applied and monitored per AASM Specifications.   EEG, EOG, Chin and Limb EMG, were sampled at 200 Hz.  ECG, Snore and Nasal Pressure, Thermal Airflow, Respiratory Effort, CPAP Flow and Pressure, Oximetry was sampled at 50 Hz. Digital video and audio were recorded.      The patient was fitted by [name] [name] with an AirFit P 10 nasal pillow by [employer] in small size. CPAP  was initiated at 5 cmH20 with heated humidity per AASM split night standards and pressure was advanced to 7 cmH20 because of hypopneas, apneas and desaturations.  At a PAP pressure of 7 cmH20, there was a reduction of the AHI to 0.0/h with improvement of snoring and sleep apnea in supine and lateral sleep. The patient slept 81.5 minutes, with 92% sleep efficiency.  Lights Out was at 22:43 and Lights On at 05:59. Total recording time (TRT) was 365  minutes, with a total sleep time (TST) of 282 minutes. The patient's sleep latency was 58.5 minutes. REM latency was 80 minutes.  The sleep efficiency was 77.2 %.    SLEEP ARCHITECTURE: WASO (Wake after sleep onset) was 51 minutes.  There were 31.5 minutes in Stage N1, 208.5 minutes Stage N2, 83 minutes Stage N3 and 44.5 minutes in Stage REM.  The percentage of Stage N1 was 8.6%, Stage N2 was 56.7%, Stage N3 was 22.6% and Stage R (REM sleep) was 12.1%. The sleep architecture was notable for high fragmentation within the first hour of sleep, which improved rapidly.  RESPIRATORY ANALYSIS:  There was a total of 0 respiratory events: 0 apneas and 0 hypopneas with 0 respiratory event related arousals (RERAs).The total APNEA/HYPOPNEA INDEX (AHI) was 0 /hour. 0 events occurred in REM sleep and 0 events in NREM. The REM AHI was 0 /hour versus a non-REM AHI of 0.0 /hour.  The patient spent 189 minutes of total sleep time in the supine position and 179 minutes in non-supine. The supine AHI was 0.0, versus a non-supine AHI of 0.0/h.  OXYGEN SATURATION & C02:  The baseline 02 saturation was 92%, with the lowest being 85%. Time spent below 89% saturation equaled 0.1 minutes.  AROUSALS:  The arousals were noted as: 22 were spontaneous, 0 were associated with PLMs, and 37 were associated with respiratory events. The patient had a total of 0 Periodic Limb Movements.  Audio and video analysis did not show any abnormal or unusual movements, behaviors, phonations or vocalizations.   The patient did not take bathroom breaks. Mild Snoring was noted. EKG was in keeping with normal sinus rhythm (NSR). DIAGNOSIS 1. Snoring and mild- moderate Obstructive Sleep Apnea, responding to CPAP at 6 and 7 cm water pressure.  2. No clinically significant Sleep Related Hypoxemia. 3. 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  4. Normal EKG  PLANS/RECOMMENDATIONS: start using CPAP with autotitration capability, set between 4 and 8 cm  water, 1 cm EPR, mask of patients choice. The patient was fitted with an AirFit P 10 nasal pillow by [employer] in small size. Post-study, the patient indicated that sleep was better than usual, her mouth less dry.   CPAP therapy compliance is defined as 4 hours or more with nightly use.  A follow up appointment will be scheduled in the Sleep Clinic at [facility].   Please call [phone] with any questions.    I certify that I have reviewed the entire raw data recording prior to the issuance of this report in accordance with the Standards of Accreditation of the American Academy of Sleep Medicine (AASM)  Larey Seat, M.D.  09-30-2018 Diplomat, American Board of Psychiatry and Neurology  Diplomat, American Board of Sleep Medicine Medical Director, [place] [facility]   How likely are you to doze in the following situations: 0 = not likely, 1 = slight chance, 2 = moderate chance, 3 = high chance  Sitting and Reading? Watching Television? Sitting inactive in a public place (theater  or meeting)? Lying down in the afternoon when circumstances permit? Sitting and talking to someone? Sitting quietly after lunch without alcohol? In a car, while stopped for a few minutes in traffic? As a passenger in a car for an hour without a break?  Total = 5/24 points. Much improved on CPAP.     Assessment and Plan: please follow up with ENT, Dr Constance Holster- for sinus headache work up  Try a Netti pot.    Follow Up Instructions: Prn RV , otherwise 6 month  follow up with CPAP.     I discussed the assessment and treatment plan with the patient. The patient was provided an opportunity to ask questions and all were answered. The patient agreed with the plan and demonstrated an understanding of the instructions.  The patient was advised to call back or seek an in-person evaluation if the symptoms worsen or if the condition fails to improve as anticipated.  I provided 21 minutes of  non-face-to-face time during this encounter.   Larey Seat, MD

## 2018-12-11 IMAGING — CT CT PELVIS W/O CM
2 of 3 series · 16 of 46 positions shown, 18 images · non-contrast
Comparison: 10/08/2015.

CLINICAL DATA: Localized left upper buttock adiposity.

EXAM:
CT PELVIS WITHOUT CONTRAST
TECHNIQUE: Multidetector CT imaging of the pelvis was performed following the
standard protocol without intravenous contrast.

[Series 2: axial st · axial · 0.98mm/px · z∈[-737,-497]mm · 13 of 56 slices shown, 15 images]
[im 4/56  soft-tissue]
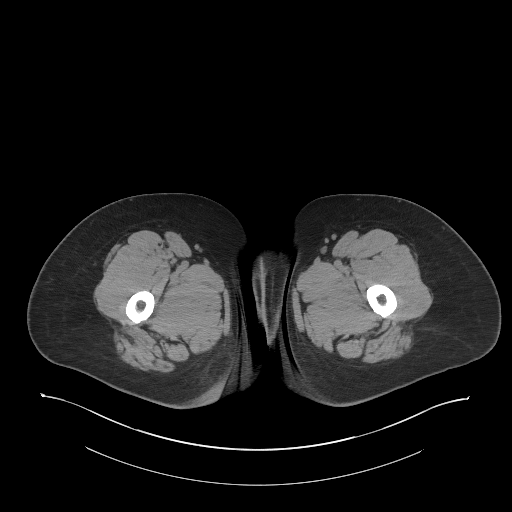
[im 4/56  bone]
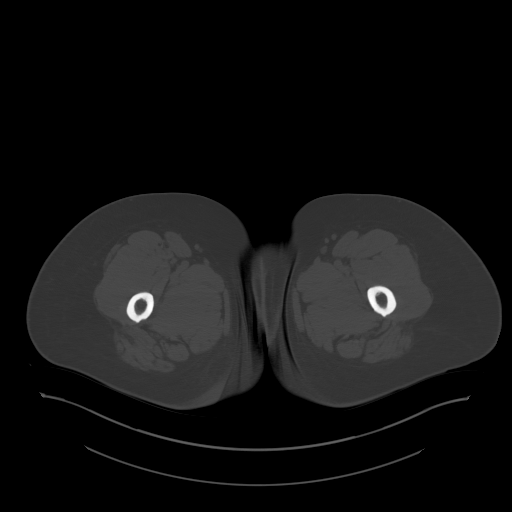
[im 8/56  soft-tissue]
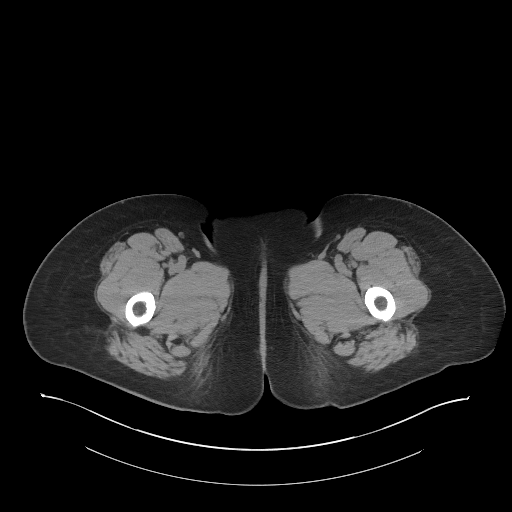
[im 11/56  soft-tissue]
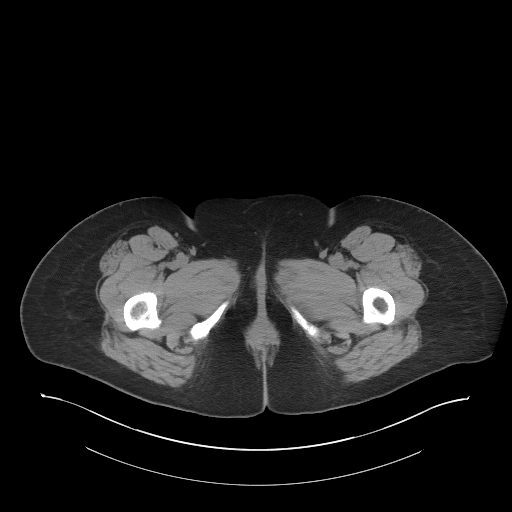
[im 16/56  soft-tissue]
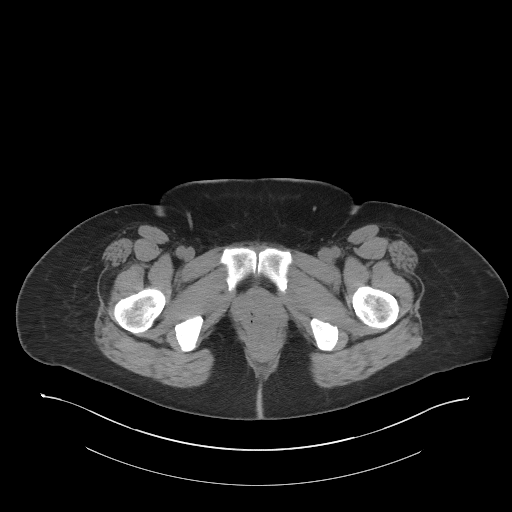
[im 20/56  soft-tissue]
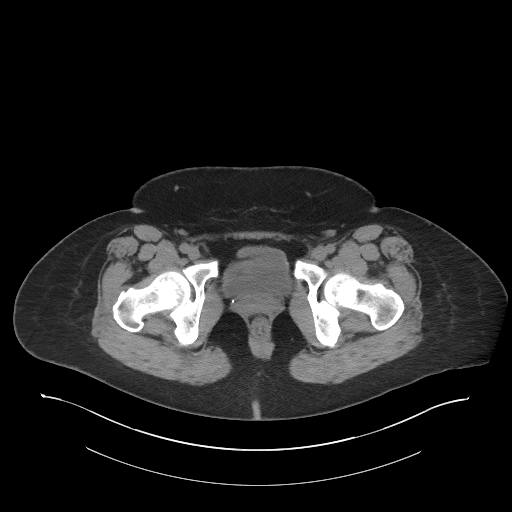
[im 24/56  soft-tissue]
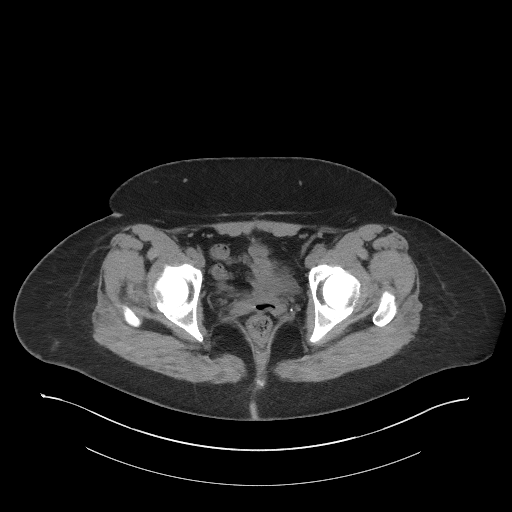
[im 29/56  soft-tissue]
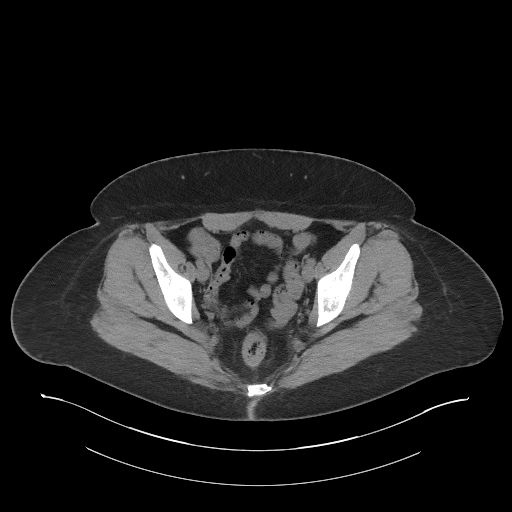
[im 32/56  soft-tissue]
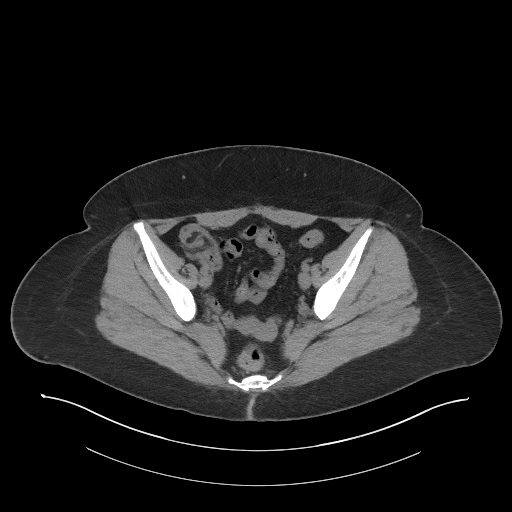
[im 36/56  soft-tissue]
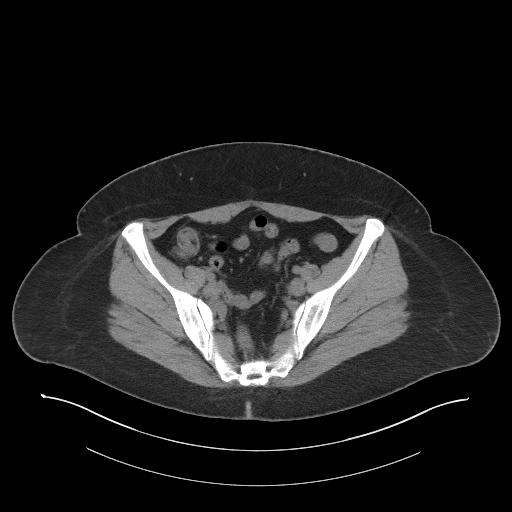
[im 36/56  bone]
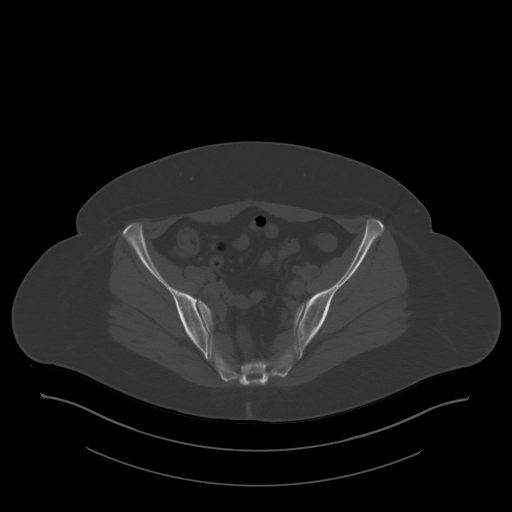
[im 40/56  soft-tissue]
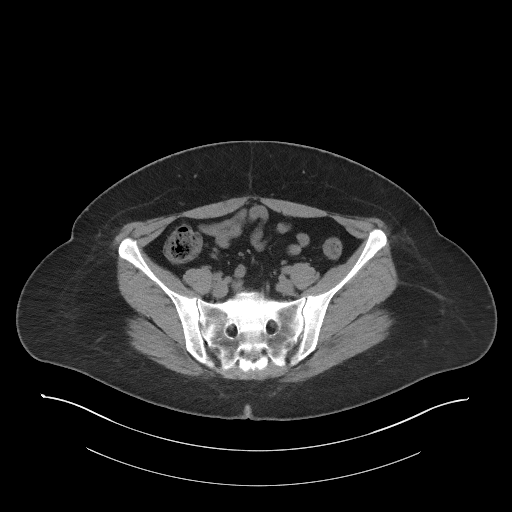
[im 45/56  soft-tissue]
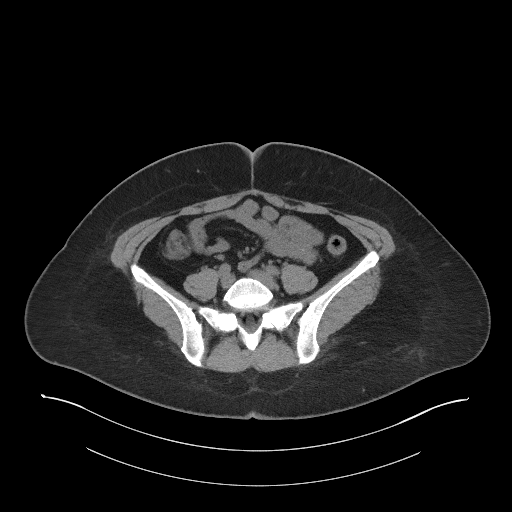
[im 48/56  soft-tissue]
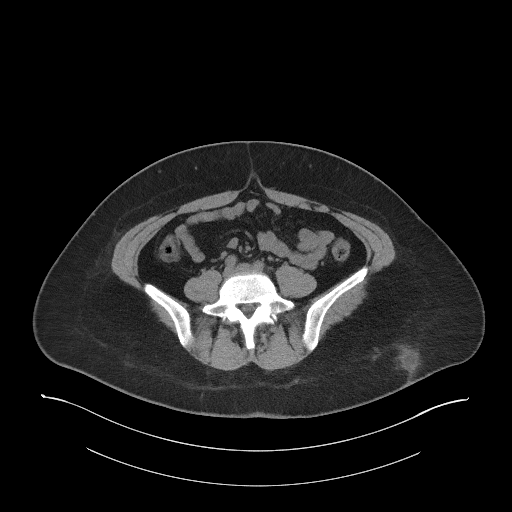
[im 52/56  soft-tissue]
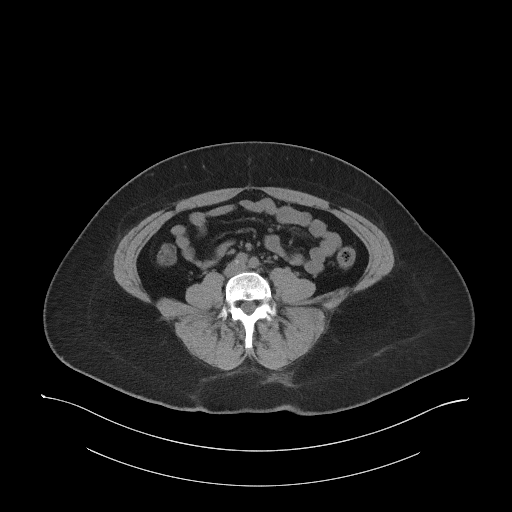

[Series 4: coronal st · coronal · 0.58mm/px · 3 of 92 slices shown]
[im 31/92  soft-tissue]
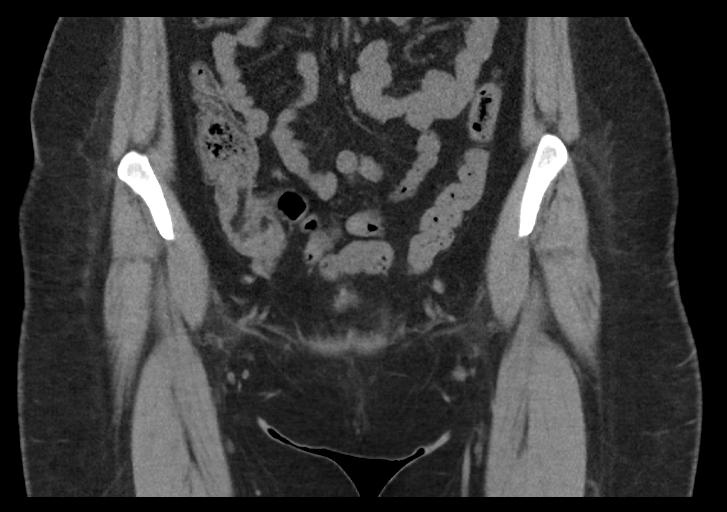
[im 41/92  soft-tissue]
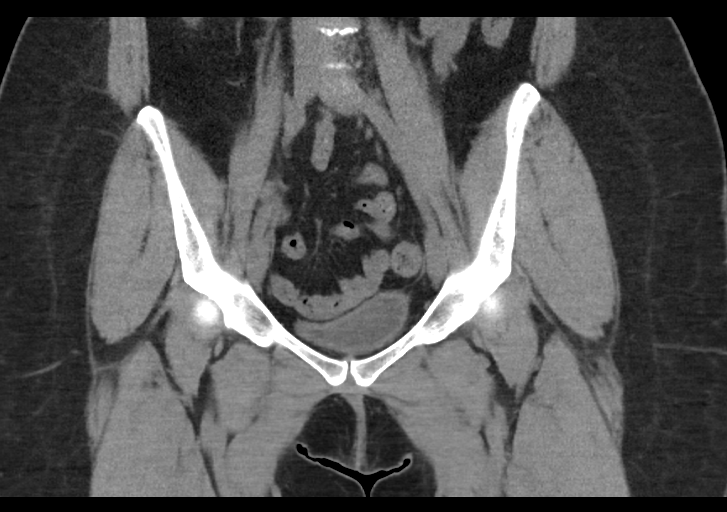
[im 51/92  soft-tissue]
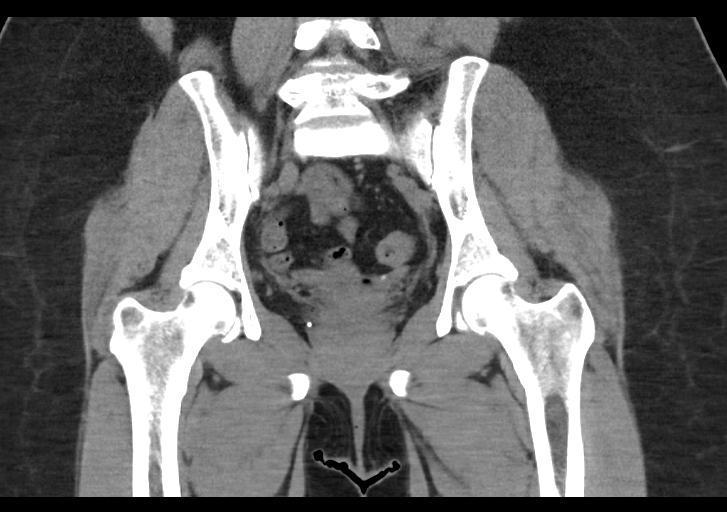

[16 of 46 positions shown; findings below may reference images not displayed]

FINDINGS: Urinary Tract:  No abnormality visualized.

Bowel:  Unremarkable visualized pelvic bowel loops.

Vascular/Lymphatic: Minimal atheromatous arterial calcification
without aneurysm. No enlarged lymph nodes.

Reproductive:  Surgically absent uterus.  No adnexal masses.

Other: Interval mildly irregular, rounded mass in the subcutaneous
fat of the left upper buttocks, measuring 2.3 x 2.1 cm on image
number 10 of series 2 and 1.5 cm in length on coronal image number
71. This is heterogeneous with central low density and peripheral
medium density with a linear extension to the skin.

Musculoskeletal: Mild lower lumbar spine degenerative changes.
IMPRESSION: Interval 2.3 x 2.1 x 1.5 cm mass in the left upper buttocks
subcutaneous fat. This has an appearance most compatible with
developing fat necrosis. Changes due to an interval subcutaneous
injection are also possible. A neoplasm is less likely.

## 2018-12-19 ENCOUNTER — Ambulatory Visit: Payer: Self-pay | Admitting: Neurology

## 2018-12-20 ENCOUNTER — Encounter: Payer: Self-pay | Admitting: Neurology

## 2018-12-26 ENCOUNTER — Other Ambulatory Visit: Payer: Self-pay | Admitting: Internal Medicine

## 2018-12-26 DIAGNOSIS — R0600 Dyspnea, unspecified: Secondary | ICD-10-CM

## 2018-12-26 DIAGNOSIS — Z1231 Encounter for screening mammogram for malignant neoplasm of breast: Secondary | ICD-10-CM

## 2018-12-29 ENCOUNTER — Other Ambulatory Visit: Payer: Self-pay

## 2018-12-29 ENCOUNTER — Ambulatory Visit
Admission: RE | Admit: 2018-12-29 | Discharge: 2018-12-29 | Disposition: A | Discharge status: Home / Self Care | Payer: PRIVATE HEALTH INSURANCE | Source: Ambulatory Visit | Attending: Internal Medicine | Admitting: Internal Medicine

## 2018-12-29 DIAGNOSIS — R0600 Dyspnea, unspecified: Secondary | ICD-10-CM

## 2019-01-16 ENCOUNTER — Encounter: Payer: Self-pay | Admitting: Neurology

## 2019-01-18 ENCOUNTER — Telehealth: Payer: Self-pay

## 2019-01-18 NOTE — Telephone Encounter (Signed)
Called pt to switch her appt on 6/23 from Virtual to In-Office and move time from 9:30 to 9:40.Marland KitchenMarland KitchenNo answer and VM is full.

## 2019-01-19 NOTE — Telephone Encounter (Signed)
Patient returned call, she is willing to come into office.  I switched her appt to in office appt, and changed time to 9:40am.

## 2019-01-22 ENCOUNTER — Telehealth: Payer: Self-pay | Admitting: Interventional Cardiology

## 2019-01-22 NOTE — Telephone Encounter (Signed)
Left message to call back.  Pt needs to be screened for COVID for appt tomorrow.

## 2019-01-22 NOTE — Progress Notes (Signed)
Cardiology Office Note:    Date:  01/23/2019   ID:  [name], [name] 02-11-1966, MRN [id number]  PCP:  Marton Redwood, MD  Cardiologist:  No primary care provider on file.   Referring MD: Marton Redwood, MD   Chief Complaint  Patient presents with  . Shortness of Breath  . Chest Pain    History of Present Illness:    [name] [name] is a 53 y.o. female with a hx of obesity, asthma, hypertension and family h/o heart disease. Referred by hr husband, Dr.J. Roderic Palau for evaluation of chest pain. 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  She is pleasant and the wife of colleague, Dr. Milton Ferguson.  Throughout most of her life she has been fatigued and very physically active.  Over the last 5 to 8 years she has developed increased weight, subcutaneous lipoma, sleep apnea, and now notes that there is lower extremity swelling.  She believes her diagnosis is Dercum's disease/syndrome.  He is trying to get help with this.  She is to go to the [facility]  She is seen her primary physician Dr. Lutricia Feil and neurologist, Dr. Asencion Partridge Dohmeier.  Dr. [name] diagnosed sleep apnea.  Of cardiovascular concern is noticeable dyspnea on exertion, intermittent episodes of chest discomfort, and concern on her part because there is a family history of coronary disease (father had aortic aneurysm and CAD) and her maternal grandfather had coronary artery disease.  She previously exercised vigorously.  Upon finding that physical activity could worsen the prognosis with "Dercum's disease", she has become much less active and deconditioned.  During this time she has continued to gain weight.  She is concerned about the possibility of CAD.  She has had prior nuclear testing that was unremarkable (performed by Dr. Alvester Chou).  Past Medical History:  Diagnosis Date  . Anemia     many years ago  . Anxiety    situational  . Arthritis    "hands" (07/12/2012)  . Asthma    seasonal   . Chest pain   . Chest pain at rest, on going 07/12/2012  . Family history of early CAD 07/12/2012  . Fibromyalgia   . GERD (gastroesophageal reflux disease)   . Headache(784.0)    "often; not daily" (07/12/2012)  . Hypertension    not on medications  . Kidney stones   . Migraine   . Pneumonia    as a child  . PONV (postoperative nausea and vomiting)    she states she gets very sick    Past Surgical History:  Procedure Laterality Date  . breast lift    . CARDIAC CATHETERIZATION  07/11/2012  . CHOLECYSTECTOMY N/A 09/18/2015   Procedure: LAPAROSCOPIC CHOLECYSTECTOMY;  Surgeon: Coralie Keens, MD;  Location: [place];  Service: General;  Laterality: N/A;  . LEFT HEART CATHETERIZATION WITH CORONARY ANGIOGRAM N/A 07/11/2012   Procedure: LEFT HEART CATHETERIZATION WITH CORONARY ANGIOGRAM;  Surgeon: Lorretta Harp, MD;  Location: [facility] CATH LAB;  Service: Cardiovascular;  Laterality: N/A;  . REDUCTION MAMMAPLASTY Bilateral 10+ years ago  . TONSILLECTOMY  ~ 1976  . tubes and ovaries removed  2015  . VAGINAL HYSTERECTOMY  ~ 2009    Current Medications: Current Meds  Medication Sig  . albuterol (PROAIR HFA) 108 (90 Base) MCG/ACT inhaler Inhale 2 puffs into the lungs 2 (two) times daily as needed for wheezing or shortness of  breath.  . EPINEPHrine (EPIPEN 2-PAK) 0.3 mg/0.3 mL IJ SOAJ injection Inject 0.3 mLs (0.3 mg total) into the muscle as needed.  . Estradiol 0.75 MG/1.25 GM (0.06%) topical gel Place 1.25 g onto the skin daily.  Marland Kitchen HYDROcodone-Acetaminophen (LORTAB) 10-300 MG/15ML SOLN Take 11.25 mLs by mouth every 4 (four) hours as needed (for pain).   Marland Kitchen levocetirizine (XYZAL) 5 MG tablet Take 5 mg by mouth every morning.   . mometasone (NASONEX) 50 MCG/ACT nasal spray Place 2 sprays into the nose daily.  . montelukast (SINGULAIR) 10 MG tablet Take 10 mg by mouth daily.  .  Multiple Vitamins-Calcium (ONE-A-DAY WOMENS PO) Take 1 tablet by mouth daily.  Marland Kitchen olmesartan (BENICAR) 20 MG tablet Take 20 mg by mouth daily.  . ondansetron (ZOFRAN ODT) 4 MG disintegrating tablet Take 1 tablet (4 mg total) by mouth every 8 (eight) hours as needed for nausea or vomiting.  . promethazine (PHENERGAN) 12.5 MG tablet Take 12.5 mg by mouth every 4 (four) hours as needed.  . RABEprazole (ACIPHEX) 20 MG tablet Take 20 mg by mouth 2 (two) times daily.  . rizatriptan (MAXALT-MLT) 10 MG disintegrating tablet Take 10 mg by mouth every 2 (two) hours as needed for migraine.   . Vitamin D, Ergocalciferol, (DRISDOL) 50000 units CAPS capsule Take 50,000 Units by mouth every Wednesday.      Allergies:   Contrast media [iodinated diagnostic agents], Morphine and related, Prednisone, Yellow jacket venom [bee venom], Azithromycin, Erythromycin, Iohexol, Oxycontin [oxycodone], Avelox [moxifloxacin hcl in nacl], Codeine, Gadolinium derivatives, Levaquin [levofloxacin in d5w], Oxycodone-acetaminophen, Pantoprazole, Augmentin [amoxicillin-pot clavulanate], Dilaudid [hydromorphone hcl], Oxycodone hcl, and Tramadol   Social History   Socioeconomic History  . Marital status: Married    Spouse name: Not on file  . Number of children: Not on file  . Years of education: Not on file  . Highest education level: Not on file  Occupational History  . Not on file  Social Needs  . Financial resource strain: Not on file  . Food insecurity    Worry: Not on file    Inability: Not on file  . Transportation needs    Medical: Not on file    Non-medical: Not on file  Tobacco Use  . Smoking status: Never Smoker  . Smokeless tobacco: Never Used  Substance and Sexual Activity  . Alcohol use: No    Frequency: Never  . Drug use: No  . Sexual activity: Yes  Lifestyle  . Physical activity    Days per week: Not on file    Minutes per session: Not on file  . Stress: Not on file  Relationships  . Social  Herbalist on phone: Not on file    Gets together: Not on file    Attends religious service: Not on file    Active member of club or organization: Not on file    Attends meetings of clubs or organizations: Not on file    Relationship status: Not on file  Other Topics Concern  . Not on file  Social History Narrative  . Not on file     Family History: The patient's family history includes Chronic Renal Failure in her paternal grandmother; Depression in her mother; Drug abuse in her son; Heart disease in her father; Hiatal hernia in her mother; Hypertension in her mother; Stroke in her maternal grandfather.  ROS:   Please see the history of present illness.    She is depressed.  She has  swelling in her legs although it does not appear to represent edema.  The areas of lipomatosis are tender.  She is compliant with CPAP.  All other systems reviewed and are negative.  EKGs/Labs/Other Studies Reviewed:    The following studies were reviewed today:  CHEST CT 12/2018: FINDINGS: Cardiovascular: Heart is normal size. Aorta is normal caliber. IMPRESSION: 4 mm nodule along the right minor fissure. No follow-up needed if patient is low-risk. Non-contrast chest CT can be considered in 12 months if patient is high-risk. This recommendation follows the consensus statement: Guidelines for Management of Incidental Pulmonary Nodules Detected on CT Images: From the Fleischner Society 2017; Radiology 2017; 284:228-243.  No acute cardiopulmonary disease.   ECHOCARDIOGRAM 2019: Study Conclusions  - Left ventricle: The cavity size was normal. Wall thickness was   increased in a pattern of mild LVH. Systolic function was normal.   The estimated ejection fraction was in the range of 55% to 60%.   Wall motion was normal; there were no regional wall motion   abnormalities.    EKG:  EKG performed April 1 revealed normal sinus rhythm without acute changes.  Recent Labs: 11/01/2018: ALT  25; BUN 14; Creatinine, Ser 0.91; Hemoglobin 14.0; Platelets 276; Potassium 3.6; Sodium 141  Recent Lipid Panel No results found for: CHOL, TRIG, HDL, CHOLHDL, VLDL, LDLCALC, LDLDIRECT  Physical Exam:    VS:  BP 116/78   Pulse 81   Ht 5\' 5"  (1.651 m)   Wt 222 lb 9.6 oz (101 kg)   SpO2 98%   BMI 37.04 kg/m     Wt Readings from Last 3 Encounters:  01/23/19 222 lb 9.6 oz (101 kg)  11/01/18 195 lb (88.5 kg)  04/25/18 216 lb (98 kg)     GEN: Moderate truncal obesity. No acute distress HEENT: Normal NECK: No JVD. LYMPHATICS: No lymphadenopathy CARDIAC: RRR.  No murmur, no gallop, no edema VASCULAR: 2+ bilateral radial pulses, no bruits RESPIRATORY:  Clear to auscultation without rales, wheezing or rhonchi  ABDOMEN: Soft, non-tender, non-distended, No pulsatile mass, MUSCULOSKELETAL: No deformity  SKIN: Warm and dry NEUROLOGIC:  Alert and oriented x 3 PSYCHIATRIC:  Normal affect   ASSESSMENT:    1. Chest discomfort   2. Essential hypertension   3. Morbid obesity ([place])   4. Family history of early CAD   25. OSA on CPAP   6. Familial hypercholesterolemia   7. Dercum's disease   8. Educated About Covid-19 Virus Infection    PLAN:    In order of problems listed above:  1. Review of noncontrast chest CT did not reveal evidence of coronary calcification.  I believe the ideal evaluation would be coronary CTA with FFR if indicated however, she is allergic to contrast (anaphylaxis).  Therefore we will defer to functional testing with pharmacologic Myoview to exclude high risk myocardial ischemia substrate. 2. Blood pressure is normal on today's exam.  On recent echocardiography there is suggestion of very mild concentric hypertrophy.  Whether this is hypertrophy versus LV thickening needs to be determined.  Given her symptoms, and her underlying disease process perhaps cardiac MRI will be needed at some point in the future. 3. Noted and likely related to her underlying disease  process 4. Noted. 5. Compliant with CPAP. 6. Significant hyperlipidemia with total cholesterol 245 LDL 145 and A1c is not . 7. I am unfamiliar with this disease process but will do some research into the entity. 8. She is well versed on COVID-19 pandemic including social distancing,  masking, and handwashing.  We need to exclude coronary disease given family history (father), significant hyperlipidemia, prior history of elevated blood pressure, weight, and obstructive sleep apnea.  Recently no calcium was seen on recent CT scan of the chest.  Hopefully, functional testing will be normal.  Ideal test in this situation would have been coronary CT with FFR if indicated.  May need MRI to evaluate myocardial substrate to rule out infiltrative disease since she has slight increase in LV wall thickness.   Medication Adjustments/Labs and Tests Ordered: Current medicines are reviewed at length with the patient today.  Concerns regarding medicines are outlined above.  Orders Placed This Encounter  Procedures  . MYOCARDIAL PERFUSION IMAGING   No orders of the defined types were placed in this encounter.   Patient Instructions  Medication Instructions:  Your physician recommends that you continue on your current medications as directed. Please refer to the Current Medication list given to you today.  If you need a refill on your cardiac medications before your next appointment, please call your pharmacy.   Lab work: None If you have labs (blood work) drawn today and your tests are completely normal, you will receive your results only by: Marland Kitchen MyChart Message (if you have MyChart) OR . A paper copy in the mail If you have any lab test that is abnormal or we need to change your treatment, we will call you to review the results.  Testing/Procedures: Your physician has requested that you have a lexiscan myoview. For further information please visit [email or web address]. Please follow instruction sheet,  as given.   Follow-Up: Your physician recommends that you schedule a follow-up appointment as needed with Dr. [name].    Any Other Special Instructions Will Be Listed Below (If Applicable).       Signed, Sinclair Grooms, MD  01/23/2019 12:39 PM    [place]

## 2019-01-23 ENCOUNTER — Other Ambulatory Visit: Payer: Self-pay

## 2019-01-23 ENCOUNTER — Telehealth (HOSPITAL_COMMUNITY): Payer: Self-pay | Admitting: *Deleted

## 2019-01-23 ENCOUNTER — Telehealth: Payer: PRIVATE HEALTH INSURANCE | Admitting: Interventional Cardiology

## 2019-01-23 ENCOUNTER — Encounter: Payer: Self-pay | Admitting: *Deleted

## 2019-01-23 ENCOUNTER — Ambulatory Visit: Payer: PRIVATE HEALTH INSURANCE | Admitting: Interventional Cardiology

## 2019-01-23 ENCOUNTER — Encounter: Payer: Self-pay | Admitting: Interventional Cardiology

## 2019-01-23 VITALS — BP 116/78 | HR 81 | Ht 65.0 in | Wt 222.6 lb

## 2019-01-23 DIAGNOSIS — E882 Lipomatosis, not elsewhere classified: Secondary | ICD-10-CM

## 2019-01-23 DIAGNOSIS — Z7189 Other specified counseling: Secondary | ICD-10-CM

## 2019-01-23 DIAGNOSIS — I10 Essential (primary) hypertension: Secondary | ICD-10-CM | POA: Diagnosis not present

## 2019-01-23 DIAGNOSIS — Z9989 Dependence on other enabling machines and devices: Secondary | ICD-10-CM

## 2019-01-23 DIAGNOSIS — G4733 Obstructive sleep apnea (adult) (pediatric): Secondary | ICD-10-CM

## 2019-01-23 DIAGNOSIS — R0789 Other chest pain: Secondary | ICD-10-CM

## 2019-01-23 DIAGNOSIS — Z8249 Family history of ischemic heart disease and other diseases of the circulatory system: Secondary | ICD-10-CM | POA: Diagnosis not present

## 2019-01-23 DIAGNOSIS — E7801 Familial hypercholesterolemia: Secondary | ICD-10-CM

## 2019-01-23 NOTE — Patient Instructions (Addendum)
Medication Instructions:  Your physician recommends that you continue on your current medications as directed. Please refer to the Current Medication list given to you today.  If you need a refill on your cardiac medications before your next appointment, please call your pharmacy.   Lab work: None If you have labs (blood work) drawn today and your tests are completely normal, you will receive your results only by: . MyChart Message (if you have MyChart) OR . A paper copy in the mail If you have any lab test that is abnormal or we need to change your treatment, we will call you to review the results.  Testing/Procedures: Your physician has requested that you have a lexiscan myoview. For further information please visit [email or web address]. Please follow instruction sheet, as given.   Follow-Up: Your physician recommends that you schedule a follow-up appointment as needed with Dr. Smith.    Any Other Special Instructions Will Be Listed Below (If Applicable).    

## 2019-01-23 NOTE — Telephone Encounter (Signed)
Attempted to call patient regarding upcoming nuclear test to review instructions, no answer, unable to leave a message.  [name] [name]

## 2019-01-25 ENCOUNTER — Other Ambulatory Visit: Payer: Self-pay

## 2019-01-25 ENCOUNTER — Encounter (HOSPITAL_COMMUNITY): Payer: Self-pay

## 2019-01-25 ENCOUNTER — Ambulatory Visit (HOSPITAL_COMMUNITY): Payer: PRIVATE HEALTH INSURANCE | Attending: Internal Medicine

## 2019-01-25 DIAGNOSIS — R0789 Other chest pain: Secondary | ICD-10-CM | POA: Diagnosis not present

## 2019-01-25 LAB — MYOCARDIAL PERFUSION IMAGING
LV dias vol: 66 mL (ref 46–106)
LV sys vol: 24 mL
Peak HR: 98 {beats}/min
Rest HR: 70 {beats}/min
SDS: 4
SRS: 0
SSS: 4
TID: 1.03

## 2019-01-25 MED ORDER — TECHNETIUM TC 99M TETROFOSMIN IV KIT
10.0000 | PACK | Freq: Once | INTRAVENOUS | Status: AC | PRN
  Administered 2019-01-25: 10 via INTRAVENOUS
  Filled 2019-01-25: qty 10

## 2019-01-25 MED ORDER — TECHNETIUM TC 99M TETROFOSMIN IV KIT
32.0000 | PACK | Freq: Once | INTRAVENOUS | Status: AC | PRN
  Administered 2019-01-25: 32 via INTRAVENOUS
  Filled 2019-01-25: qty 32

## 2019-01-25 MED ORDER — REGADENOSON 0.4 MG/5ML IV SOLN
0.4000 mg | Freq: Once | INTRAVENOUS | Status: AC
  Administered 2019-01-25: 0.4 mg via INTRAVENOUS

## 2019-02-06 ENCOUNTER — Ambulatory Visit: Payer: PRIVATE HEALTH INSURANCE | Admitting: Internal Medicine

## 2019-02-06 ENCOUNTER — Encounter: Payer: Self-pay | Admitting: Internal Medicine

## 2019-02-06 ENCOUNTER — Other Ambulatory Visit: Payer: Self-pay

## 2019-02-06 DIAGNOSIS — R05 Cough: Secondary | ICD-10-CM

## 2019-02-06 DIAGNOSIS — R0609 Other forms of dyspnea: Secondary | ICD-10-CM | POA: Insufficient documentation

## 2019-02-06 DIAGNOSIS — R058 Other specified cough: Secondary | ICD-10-CM | POA: Insufficient documentation

## 2019-02-06 LAB — D-DIMER, QUANTITATIVE: D-Dimer, Quant: 0.49 mcg/mL FEU (ref <0.50)

## 2019-02-06 NOTE — Patient Instructions (Addendum)
Aciphex 20mg  Take 30- 60 min before your first and last meals of the day   GERD (REFLUX)  is an extremely common cause of respiratory symptoms just like yours , many times with no obvious heartburn at all.    It can be treated with medication, but also with lifestyle changes including elevation of the head of your bed (ideally with 6-8inch blocks under the headboard of your bed),  Smoking cessation, avoidance of late meals, excessive alcohol, and avoid fatty foods, chocolate, peppermint, colas, red wine, and acidic juices such as orange juice.  NO MINT OR MENTHOL PRODUCTS SO NO COUGH DROPS  USE SUGARLESS CANDY INSTEAD (Jolley ranchers or Stover's or Life Savers) or even ice chips will also do - the key is to swallow to prevent all throat clearing. NO OIL BASED VITAMINS - use powdered substitutes.  Avoid fish oil when coughing.  For drainage / throat tickle try take CHLORPHENIRAMINE  4 mg  (Chlortab 4mg   at [employer] should be easiest to find in the green box)  take one every 4 hours as needed - available over the counter- may cause drowsiness so start with just a bedtime dose or two and see how you tolerate it before trying in daytime  (instead of xyzol)   Please remember to go to the lab department   for your tests - we will call you with the results when they are available.      we will set you up for a PFT after covid testing then see you back

## 2019-02-06 NOTE — Progress Notes (Signed)
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     History of Present Illness  02/06/2019  Pulmonary/ 1st office eval/Demorris Choyce  Chief Complaint  Patient presents with   Pulmonary Consult    Referred by Dr. Marton Redwood for SOB. Pt c/o SOB since Sept 2019.   Dyspnea: walks outside x 2 hours x brisk  Pace has some doe with hills, carrying 10 lb / never checks sats  Also has pattern of resting chest tightness 10 min - 2 hour sev times week s assoc light headed s assoc  Nausea  Or  diaphoresis   Cough: pnds x years, also now cough with exertion maybe worse in cold air > non-productive  Sleep: no resp symptoms but cpap started march 2020 per Dohmeier  SABA use: ? Some better p rx  Sensation of choking x summer 2020 mostly with drinking fluids any temp s loss of voice   No obvious day to day or daytime variability or assoc excess/ purulent sputum or mucus plugs or hemoptysis or subjective wheeze or overt sinus or hb symptoms.   Sleeping as above without nocturnal  or early am exacerbation  of respiratory  c/o's or need for noct saba. Also denies any obvious fluctuation of symptoms with weather or environmental changes or other aggravating or alleviating factors except as outlined above   No unusual exposure hx or h/o childhood pna/ asthma or knowledge of premature birth.  Current Allergies, Complete Past Medical History, Past Surgical History, Family History, and Social History were  reviewed in [employer] record.  ROS  The following are not active complaints unless bolded Hoarseness, sore throat, dysphagia, dental problems, itching, sneezing,  nasal congestion or discharge of excess mucus or purulent secretions, ear ache,   fever, chills, sweats, unintended wt loss or wt gain, classically pleuritic or exertional cp,  orthopnea pnd or arm/hand swelling  or leg swelling, presyncope, palpitations, abdominal pain, anorexia, nausea, vomiting, diarrhea  or change in bowel habits or change in bladder habits, change in stools or change in urine, dysuria, hematuria,  rash, arthralgias, visual complaints, headache, numbness, weakness or ataxia or problems with walking or coordination,  change in mood or  memory.            Past Medical History:  Diagnosis Date   Anemia    many years ago   Anxiety    situational   Arthritis    "hands" (07/12/2012)   Asthma    seasonal    Chest pain    Chest pain at rest, on going 07/12/2012   Family history of early CAD 07/12/2012   Fibromyalgia    GERD (gastroesophageal reflux disease)    Headache(784.0)    "often; not daily" (07/12/2012)   Hypertension    not on medications   Kidney stones    Migraine    Pneumonia  as a child   PONV (postoperative nausea and vomiting)    she states she gets very sick    Outpatient Medications Prior to Visit  Medication Sig Dispense Refill   albuterol (PROAIR HFA) 108 (90 Base) MCG/ACT inhaler Inhale 2 puffs into the lungs 2 (two) times daily as needed for wheezing or shortness of breath.     EPINEPHrine (EPIPEN 2-PAK) 0.3 mg/0.3 mL IJ SOAJ injection Inject 0.3 mLs (0.3 mg total) into the muscle as needed. 2 Device 1   levocetirizine (XYZAL) 5 MG tablet Take 5 mg by mouth every morning.      montelukast (SINGULAIR) 10 MG tablet Take 10 mg by mouth daily.     Multiple Vitamins-Calcium (ONE-A-DAY WOMENS PO) Take 1 tablet by mouth daily.     olmesartan  (BENICAR) 20 MG tablet Take 20 mg by mouth daily.     ondansetron (ZOFRAN ODT) 4 MG disintegrating tablet Take 1 tablet (4 mg total) by mouth every 8 (eight) hours as needed for nausea or vomiting. 6 tablet 0   RABEprazole (ACIPHEX) 20 MG tablet Take 20 mg by mouth 2 (two) times daily.     rizatriptan (MAXALT-MLT) 10 MG disintegrating tablet Take 10 mg by mouth every 2 (two) hours as needed for migraine.   11   Vitamin D, Ergocalciferol, (DRISDOL) 50000 units CAPS capsule Take 50,000 Units by mouth every Wednesday.      Estradiol 0.75 MG/1.25 GM (0.06%) topical gel Place 1.25 g onto the skin daily. (Patient not taking: Reported on 02/06/2019) 1 Bottle 12   HYDROcodone-Acetaminophen (LORTAB) 10-300 MG/15ML SOLN Take 11.25 mLs by mouth every 4 (four) hours as needed (for pain).      mometasone (NASONEX) 50 MCG/ACT nasal spray Place 2 sprays into the nose daily. 17 g 12   promethazine (PHENERGAN) 12.5 MG tablet Take 12.5 mg by mouth every 4 (four) hours as needed.        Objective:     BP 108/70 (BP Location: Left Arm, Cuff Size: Normal)    Pulse 96    Temp 98.2 F (36.8 C) (Oral)    Ht 5' 5.25" (1.657 m)    Wt 218 lb 6.4 oz (99.1 kg)    SpO2 97% Comment: on RA   BMI 36.07 kg/m   SpO2: 97 %(on RA)    amb mod obese wf talking a mile a minute  HEENT: nl dentition, turbinates bilaterally, and oropharynx. Nl external ear canals without cough reflex   NECK :  without JVD/Nodes/TM/ nl carotid upstrokes bilaterally   LUNGS: no acc muscle use,  Nl contour chest which is clear to A and P bilaterally without cough on insp or exp maneuvers   CV:  RRR  no s3 or murmur or increase in P2, and no edema   ABD:  Obese/ soft and nontender with nl inspiratory excursion in the supine position. No bruits or organomegaly appreciated, bowel sounds nl  MS:  Nl gait/ ext warm without deformities, calf tenderness, cyanosis or clubbing No obvious joint restrictions   SKIN: warm and dry with minimal  change in skin texture = coarse lumpiness/ no discrete lipomas    NEURO:  alert, approp, nl sensorium with  no motor or cerebellar deficits apparent.      I personally reviewed images and agree with radiology impression as follows:   Chest CT 12/29/18 4 mm nodule along the right minor fissure. No follow-up needed if patient is low-risk  Labs ordered/ reviewed:  Chemistry      Component Value Date/Time   NA 141 11/01/2018 1939   K 3.6 11/01/2018 1939   CL 105 11/01/2018 1939   CO2 25 11/01/2018 1939   BUN 14 11/01/2018 1939   CREATININE 0.91 11/01/2018 1939      Component Value Date/Time   CALCIUM 8.9 11/01/2018 1939   ALKPHOS 81 11/01/2018 1939   AST 22 11/01/2018 1939   ALT 25 11/01/2018 1939   BILITOT 0.7 11/01/2018 1939        Lab Results  Component Value Date   WBC 8.8 11/01/2018   HGB 14.0 11/01/2018   HCT 44.4 11/01/2018   MCV 95.9 11/01/2018   PLT 276 11/01/2018     Lab Results  Component Value Date   DDIMER 0.49 02/06/2019       Labs ordered 02/06/2019  Allergy eval      Assessment   DOE (dyspnea on exertion) Onset around 2017 at baseline wt 130 - at wt 218: 02/06/2019   Walked RA  3 laps @  approx 277ft each @ very fast pace  stopped due to  End of study, talking whole time s sob, sats at very end dipped to 89%  - d dimer nl 02/06/2019    Symptoms are markedly disproportionate to objective findings and not clear to what extent this is actually a pulmonary  problem but pt does appear to have difficult to sort out respiratory symptoms of unknown origin for which  DDX  = almost all start with A and  include Adherence, Ace Inhibitors, Acid Reflux, Active Sinus Disease, Alpha 1 Antitripsin deficiency, Anxiety masquerading as Airways dz,  ABPA,  Allergy(esp in young), Aspiration (esp in elderly), Adverse effects of meds,  Active smoking or Vaping, A bunch of PE's/clot burden (a few small clots can't cause this syndrome unless there is already severe  underlying pulm or vascular dz with poor reserve),  Anemia or thyroid disorder, plus two Bs  = Bronchiectasis and Beta blocker use..and one C= CHF     Adherence is always the initial "prime suspect" and is a multilayered concern that requires a "trust but verify" approach in every patient - starting with knowing how to use medications, especially inhalers, correctly, keeping up with refills and understanding the fundamental difference between maintenance and prns vs those medications only taken for a very short course and then stopped and not refilled.   ? Acid (or non-acid) GERD > always difficult to exclude as up to 75% of pts in some series report no assoc GI/ Heartburn symptoms> rec continue max (24h)  acid suppression and added diet restrictions/ reviewed     ? Anxiety assoc with wt gain/ globus  > usually at the bottom of this list of usual suspects but should be much higher on this pt's based on H and P may interfere with adherence and also interpretation of response or lack thereof to symptom management which can be quite subjective.  ? Allergy /asthma/pnds > add 1st gen H1 blockers per guidelines  But hold off bronchodilators until pfts done and allergy profile back but ok to continue singulair for now   ? Adverse drug effects > none of the usual suspects listed  ? A bunch of PE's > D dimer nl - while a normal  or high normal value (seen commonly in the elderly or chronically ill)  may miss small peripheral pe, the clot burden with sob is moderately high and the d dimer  has a  very high neg pred value if used in this setting.    ? chf /cardiac asthma > already excluded     Upper airway cough syndrome Onset ? All her life? Better p taking allergy shots 2014 but could not maintain them due to side effects - Allergy profile 02/06/2019 >  Eos 0. /  IgE   - trial of 1st gen H1 blockers per guidelines  02/06/2019   Upper airway cough syndrome (previously labeled PNDS),  is so named because it's  frequently impossible to sort out how much is  CR/sinusitis with freq throat clearing (which can be related to primary GERD)   vs  causing  secondary (" extra esophageal")  GERD from wide swings in gastric pressure that occur with throat clearing, often  promoting self use of mint and menthol lozenges that reduce the lower esophageal sphincter tone and exacerbate the problem further in a cyclical fashion.   These are the same pts (now being labeled as having "irritable larynx syndrome" or globus  by some cough centers) who not infrequently have a history of having failed to tolerate ace inhibitors,  dry powder inhalers or biphosphonates or report having atypical/extraesophageal reflux symptoms that don't respond to standard doses of PPI  and are easily confused as having aecopd or asthma flares by even experienced allergists/ pulmonologists (myself included).    >>> Will send off allergy profile, continue singulair, try 1st gen H1 blockers per guidelines  In place of 3rd gen h1 blocker and f/u with pfts to exclude asthma or any mechanical upper airway obstruction concern  Total time devoted to counseling  > 50 % of initial 60 min office visit:  review case with pt/  directly observed portions of ambulatory 02 saturation study/ discussion of options/alternatives/ personally creating written customized instructions  in presence of pt  then going over those specific  Instructions directly with the pt including how to use all of the meds but in particular covering each new medication in detail and the difference between the maintenance= "automatic" meds and the prns using an action plan format for the latter (If this problem/symptom => do that organization reading Left to right).  Please see AVS from this visit for a full list of these instructions which I personally wrote for this pt and  are unique to this visit.    Outpatient Encounter Medications as of 02/06/2019  Medication Sig   albuterol (PROAIR HFA)  108 (90 Base) MCG/ACT inhaler Inhale 2 puffs into the lungs 2 (two) times daily as needed for wheezing or shortness of breath.   EPINEPHrine (EPIPEN 2-PAK) 0.3 mg/0.3 mL IJ SOAJ injection Inject 0.3 mLs (0.3 mg total) into the muscle as needed.   montelukast (SINGULAIR) 10 MG tablet Take 10 mg by mouth daily.   Multiple Vitamins-Calcium (ONE-A-DAY WOMENS PO) Take 1 tablet by mouth daily.   olmesartan (BENICAR) 20 MG tablet Take 20 mg by mouth daily.   ondansetron (ZOFRAN ODT) 4 MG disintegrating tablet Take 1 tablet (4 mg total) by mouth every 8 (eight) hours as needed for nausea or vomiting.   RABEprazole (ACIPHEX) 20 MG tablet Take 20 mg by mouth 2 (two) times daily.   rizatriptan (MAXALT-MLT) 10 MG disintegrating tablet Take 10 mg by mouth every 2 (two) hours as needed for migraine.    Vitamin D, Ergocalciferol, (DRISDOL) 50000 units CAPS capsule Take 50,000 Units by mouth every Wednesday.         Estradiol 0.75 MG/1.25 GM (0.06%) topical gel  Place 1.25 g onto the skin daily. (Patient not taking: Reported on 02/06/2019)   chlorpeniramine  4 mg otc                 Christinia Gully, MD 02/06/2019

## 2019-02-07 ENCOUNTER — Encounter: Payer: Self-pay | Admitting: Internal Medicine

## 2019-02-07 ENCOUNTER — Telehealth: Payer: Self-pay | Admitting: Neurology

## 2019-02-07 LAB — RESPIRATORY ALLERGY PROFILE REGION II ~~LOC~~
Allergen, A. alternata, m6: <0.1 kU/L
Allergen, Cedar tree, t12: 0.13 kU/L — ABNORMAL HIGH
Allergen, Comm Silver Birch, t9: <0.1 kU/L
Allergen, Cottonwood, t14: 0.11 kU/L — ABNORMAL HIGH
Allergen, D pternoyssinus,d7: <0.1 kU/L
Allergen, Mouse Urine Protein, e78: <0.1 kU/L
Allergen, Mulberry, t76: <0.1 kU/L
Allergen, Oak,t7: 0.14 kU/L — ABNORMAL HIGH
Allergen, P. notatum, m1: <0.1 kU/L
Aspergillus fumigatus, m3: <0.1 kU/L
Bermuda Grass: 0.12 kU/L — ABNORMAL HIGH
Box Elder IgE: 0.12 kU/L — ABNORMAL HIGH
CLADOSPORIUM HERBARUM (M2) IGE: <0.1 kU/L
COMMON RAGWEED (SHORT) (W1) IGE: 0.13 kU/L — ABNORMAL HIGH
Cat Dander: <0.1 kU/L
Class: 0
Class: 0
Class: 0
Class: 0
Class: 0
Class: 0
Class: 0
Class: 0
Class: 0
Class: 0
Class: 0
Class: 0
Class: 0
Class: 0
Class: 0
Class: 0
Class: 0
Class: 0
Class: 0
Class: 0
Class: 0
Class: 0
Class: 0
Class: 1
Cockroach: 0.14 kU/L — ABNORMAL HIGH
D. farinae: <0.1 kU/L
Dog Dander: <0.1 kU/L
Elm IgE: 0.13 kU/L — ABNORMAL HIGH
IgE (Immunoglobulin E), Serum: 55 kU/L (ref <=114)
Johnson Grass: 0.13 kU/L — ABNORMAL HIGH
Pecan/Hickory Tree IgE: <0.1 kU/L
Rough Pigweed  IgE: <0.1 kU/L
Sheep Sorrel IgE: 0.13 kU/L — ABNORMAL HIGH
Timothy Grass: 0.64 kU/L — ABNORMAL HIGH

## 2019-02-07 LAB — CBC WITH DIFFERENTIAL/PLATELET
Basophils Absolute: 0.1 10*3/uL (ref 0.0–0.1)
Basophils Relative: 0.8 % (ref 0.0–3.0)
Eosinophils Absolute: 0.1 10*3/uL (ref 0.0–0.7)
Eosinophils Relative: 1 % (ref 0.0–5.0)
HCT: 40.9 % (ref 36.0–46.0)
Hemoglobin: 13.4 g/dL (ref 12.0–15.0)
Lymphocytes Relative: 30.4 % (ref 12.0–46.0)
Lymphs Abs: 2.1 10*3/uL (ref 0.7–4.0)
MCHC: 32.9 g/dL (ref 30.0–36.0)
MCV: 95.5 fl (ref 78.0–100.0)
Monocytes Absolute: 0.4 10*3/uL (ref 0.1–1.0)
Monocytes Relative: 5.7 % (ref 3.0–12.0)
Neutro Abs: 4.4 10*3/uL (ref 1.4–7.7)
Neutrophils Relative %: 62.1 % (ref 43.0–77.0)
Platelets: 286 10*3/uL (ref 150.0–400.0)
RBC: 4.28 Mil/uL (ref 3.87–5.11)
RDW: 13.3 % (ref 11.5–15.5)
WBC: 7.1 10*3/uL (ref 4.0–10.5)

## 2019-02-07 LAB — INTERPRETATION:

## 2019-02-07 NOTE — Telephone Encounter (Signed)
Patient is having ear pain , Dizzy and feels drunk . Does Patient needs  to come in . Patient is very sick on her her stomach.

## 2019-02-07 NOTE — Progress Notes (Signed)
mychart msg was sent

## 2019-02-07 NOTE — Assessment & Plan Note (Signed)
Onset around 2017 at baseline wt 130 - at wt 218: 02/06/2019   Walked RA  3 laps @  approx 210ft each @ very fast pace  stopped due to  End of study, talking whole time s sob, sats at very end dipped to 89%  - d dimer nl 02/06/2019    Symptoms are markedly disproportionate to objective findings and not clear to what extent this is actually a pulmonary  problem but pt does appear to have difficult to sort out respiratory symptoms of unknown origin for which  DDX  = almost all start with A and  include Adherence, Ace Inhibitors, Acid Reflux, Active Sinus Disease, Alpha 1 Antitripsin deficiency, Anxiety masquerading as Airways dz,  ABPA,  Allergy(esp in young), Aspiration (esp in elderly), Adverse effects of meds,  Active smoking or Vaping, A bunch of PE's/clot burden (a few small clots can't cause this syndrome unless there is already severe underlying pulm or vascular dz with poor reserve),  Anemia or thyroid disorder, plus two Bs  = Bronchiectasis and Beta blocker use..and one C= CHF     Adherence is always the initial "prime suspect" and is a multilayered concern that requires a "trust but verify" approach in every patient - starting with knowing how to use medications, especially inhalers, correctly, keeping up with refills and understanding the fundamental difference between maintenance and prns vs those medications only taken for a very short course and then stopped and not refilled.   ? Acid (or non-acid) GERD > always difficult to exclude as up to 75% of pts in some series report no assoc GI/ Heartburn symptoms> rec continue max (24h)  acid suppression and added diet restrictions/ reviewed     ? Anxiety assoc with wt gain/ globus  > usually at the bottom of this list of usual suspects but should be much higher on this pt's based on H and P may interfere with adherence and also interpretation of response or lack thereof to symptom management which can be quite subjective.  ? Allergy /asthma/pnds > add  1st gen H1 blockers per guidelines  But hold off bronchodilators until pfts done and allergy profile back but ok to continue singulair for now   ? Adverse drug effects > none of the usual suspects listed  ? A bunch of PE's > D dimer nl - while a normal  or high normal value (seen commonly in the elderly or chronically ill)  may miss small peripheral pe, the clot burden with sob is moderately high and the d dimer  has a very high neg pred value if used in this setting.    ? chf /cardiac asthma > already excluded

## 2019-02-07 NOTE — Telephone Encounter (Signed)
Called the patient to get more information. Asked the pt if she had been in contact with PCP and she states she reached out but had not heard anything back. She states the ear ringing is something that has been going on for 3 mths and has not gone away despite her dermatologist stating that it would go away. She states the dizziness started up bad about 2 wks ago and in this past week dizziness has worsened and she still has ear ringing bilaterally but now developed pain in the right ear. She states the nausea and stomach discomfort comes on with the dizziness and only describes as a car sick feeling and like she just got off a bad carnival ride. She has some zofran and helped some but the other symptoms are still there. She wanted to make Dr Dohmeier aware that when she would wake up with using the CPAP she would have stuffy feeling in ears and so she stopped using CPAP to see if that got better and states that the stuffiness is gone but still has ear ringing bilaterally and pain in right ear. She had discussed through mychart message in reference to a referral and Dr Dohmeier had mentioned Dr Benjamine Mola or Wilburn Cornelia and she said if she thinks she needs to be referred to them she would be willing to see them. She wanted to also make Dr Dohmeier aware that a MD with [facility] diagnosed her with Dercuns Disease but doesn't know how to treat it.   I advised I would run all this information by Dr [name] and see what her thoughts or recommendation is. Advised if she hears anything back from her PCP let me know what they say. Informed her I would reach back out after discussing with Dr [name].

## 2019-02-07 NOTE — Assessment & Plan Note (Addendum)
Onset ? All her life? Better p taking allergy shots 2014 but could not maintain them due to side effects - Allergy profile 02/06/2019 >  Eos 0. /  IgE   - trial of 1st gen H1 blockers per guidelines  02/06/2019   Upper airway cough syndrome (previously labeled PNDS),  is so named because it's frequently impossible to sort out how much is  CR/sinusitis with freq throat clearing (which can be related to primary GERD)   vs  causing  secondary (" extra esophageal")  GERD from wide swings in gastric pressure that occur with throat clearing, often  promoting self use of mint and menthol lozenges that reduce the lower esophageal sphincter tone and exacerbate the problem further in a cyclical fashion.   These are the same pts (now being labeled as having "irritable larynx syndrome" or globus  by some cough centers) who not infrequently have a history of having failed to tolerate ace inhibitors,  dry powder inhalers or biphosphonates or report having atypical/extraesophageal reflux symptoms that don't respond to standard doses of PPI  and are easily confused as having aecopd or asthma flares by even experienced allergists/ pulmonologists (myself included).    >>> Will send off allergy profile, continue singulair, try 1st gen H1 blockers per guidelines  In place of 3rd gen h1 blocker and f/u with pfts to exclude asthma or any mechanical upper airway obstruction concern   Total time devoted to counseling  > 50 % of initial 60 min office visit:  review case with pt/  directly observed portions of ambulatory 02 saturation study/ discussion of options/alternatives/ personally creating written customized instructions  in presence of pt  then going over those specific  Instructions directly with the pt including how to use all of the meds but in particular covering each new medication in detail and the difference between the maintenance= "automatic" meds and the prns using an action plan format for the latter (If this  problem/symptom => do that organization reading Left to right).  Please see AVS from this visit for a full list of these instructions which I personally wrote for this pt and  are unique to this visit.

## 2019-02-08 ENCOUNTER — Encounter: Payer: Self-pay | Admitting: Neurology

## 2019-02-08 ENCOUNTER — Other Ambulatory Visit: Payer: Self-pay | Admitting: Neurology

## 2019-02-08 ENCOUNTER — Encounter: Payer: Self-pay | Admitting: *Deleted

## 2019-02-08 ENCOUNTER — Telehealth: Payer: Self-pay | Admitting: Neurology

## 2019-02-08 DIAGNOSIS — H9319 Tinnitus, unspecified ear: Secondary | ICD-10-CM

## 2019-02-08 DIAGNOSIS — H9201 Otalgia, right ear: Secondary | ICD-10-CM

## 2019-02-08 DIAGNOSIS — R42 Dizziness and giddiness: Secondary | ICD-10-CM

## 2019-02-08 MED ORDER — MECLIZINE HCL 25 MG PO TABS: 25.0000 mg | ORAL_TABLET | Freq: Three times a day (TID) | ORAL | 0 refills | Status: DC | PRN

## 2019-02-08 NOTE — Telephone Encounter (Signed)
  Hello Mrs Hughson  please accept my apologies- I am nor after 5 o'clock answering notes and messages and I would not have had an opening  all day.   May I send some meclizine your way until Monday ? Leave the CPAP off for now-  If you have vertigo ongoing I will create a 8.30  AM slot on Monday and we will get that MRI with special attention to the VIII CN.   Arta Bruce.

## 2019-02-08 NOTE — Telephone Encounter (Signed)
MW please advise.  Thanks.  

## 2019-02-09 ENCOUNTER — Other Ambulatory Visit: Payer: Self-pay | Admitting: *Deleted

## 2019-02-09 DIAGNOSIS — E7801 Familial hypercholesterolemia: Secondary | ICD-10-CM

## 2019-02-09 DIAGNOSIS — Z8249 Family history of ischemic heart disease and other diseases of the circulatory system: Secondary | ICD-10-CM

## 2019-02-09 DIAGNOSIS — E882 Lipomatosis, not elsewhere classified: Secondary | ICD-10-CM

## 2019-02-09 DIAGNOSIS — E78019 Familial hypercholesterolemia, unspecified: Secondary | ICD-10-CM

## 2019-02-09 NOTE — Progress Notes (Signed)
Per Dr. [name]- ok to order Liver, Lipid, LPa, and HS-CRP

## 2019-02-12 ENCOUNTER — Other Ambulatory Visit: Payer: PRIVATE HEALTH INSURANCE

## 2019-02-12 NOTE — Telephone Encounter (Signed)
Called the patient and she feels it is some better. She still has the nausea feeling. She states that if feels like maybe large lipoma growing on the ear (unsure if that is the case) but describes the ear pain has gotten a lot worse on that one side.  She has an apt with the ear MD tomorrow at 3 pm. She has an apt also today with the hair loss MD dermatologist in [place] and she will speak with her about the medication with the ear ringing.  Patient will reach out to [place] if she needs [place].

## 2019-02-13 ENCOUNTER — Other Ambulatory Visit: Payer: PRIVATE HEALTH INSURANCE

## 2019-02-13 ENCOUNTER — Other Ambulatory Visit: Payer: Self-pay

## 2019-02-13 ENCOUNTER — Ambulatory Visit
Admission: RE | Admit: 2019-02-13 | Discharge: 2019-02-13 | Disposition: A | Discharge status: Home / Self Care | Payer: PRIVATE HEALTH INSURANCE | Source: Ambulatory Visit | Attending: Internal Medicine | Admitting: Internal Medicine

## 2019-02-13 DIAGNOSIS — E7801 Familial hypercholesterolemia: Secondary | ICD-10-CM

## 2019-02-13 DIAGNOSIS — Z8249 Family history of ischemic heart disease and other diseases of the circulatory system: Secondary | ICD-10-CM

## 2019-02-13 DIAGNOSIS — Z1231 Encounter for screening mammogram for malignant neoplasm of breast: Secondary | ICD-10-CM

## 2019-02-13 DIAGNOSIS — E882 Lipomatosis, not elsewhere classified: Secondary | ICD-10-CM

## 2019-02-14 LAB — HEPATIC FUNCTION PANEL
ALT: 13 IU/L (ref 0–32)
AST: 10 IU/L (ref 0–40)
Albumin: 4.5 g/dL (ref 3.8–4.9)
Alkaline Phosphatase: 74 IU/L (ref 39–117)
Bilirubin Total: 0.3 mg/dL (ref 0.0–1.2)
Bilirubin, Direct: 0.1 mg/dL (ref 0.00–0.40)
Total Protein: 6.6 g/dL (ref 6.0–8.5)

## 2019-02-14 LAB — LIPID PANEL
Chol/HDL Ratio: 3.7 ratio (ref 0.0–4.4)
Cholesterol, Total: 233 mg/dL — ABNORMAL HIGH (ref 100–199)
HDL: 63 mg/dL (ref >39)
LDL Calculated: 128 mg/dL — ABNORMAL HIGH (ref 0–99)
Triglycerides: 208 mg/dL — ABNORMAL HIGH (ref 0–149)
VLDL Cholesterol Cal: 42 mg/dL — ABNORMAL HIGH (ref 5–40)

## 2019-02-14 LAB — LIPOPROTEIN A (LPA): Lipoprotein (a): 162.4 nmol/L — ABNORMAL HIGH (ref <75.0)

## 2019-02-14 LAB — HIGH SENSITIVITY CRP: CRP, High Sensitivity: 5.1 mg/L — ABNORMAL HIGH (ref 0.00–3.00)

## 2019-02-16 ENCOUNTER — Encounter: Payer: Self-pay | Admitting: *Deleted

## 2019-02-16 ENCOUNTER — Telehealth: Payer: Self-pay | Admitting: *Deleted

## 2019-02-16 NOTE — Telephone Encounter (Signed)
Pt very upset/ tearful, requesting call from Dr [name] before continuing on to a medication. Pt states she is going though massive changes in her health, now experiencing change in balance/ visual and having vertigo. Pt saw Dr Morrison Old 02/07/2019 which are available in [place]. She states her pulse Ox reading went from 98% to 89 with a short brisk walk.  She is concerned about Dercums disease and wonders if medication would help if she did have it. Pt was told D [name] is out of office but will return, I will forward to Dr/Rn for follow up.

## 2019-02-16 NOTE — Telephone Encounter (Signed)
Pt was at a dr app and would like a call back around noon. Will call her back at stated time.   Result Notes for Hepatic function panel  Notes recorded by Belva Crome, MD on 02/15/2019 at 4:05 PM EDT  Let the patient know the blood work shows high risk features for future cardiac events in light of her family history. The LDL cholesterol is 128 with total cholesterol 233; triglyceride is 208; high-sensitivity CRP (a marker of inflammation) is high at 5.1, and the LP(a) is twice normal and can be an independent risk factor for vascular events due to clotting. I recommend aggressive lowering of cholesterol using a combination of low-dose statin therapy and uptitrating as tolerated. The best overall therapy may be PCSK9 as it will also lower the LP(a). This laboratory data should be repeated within the next 6 months. I would currently recommend starting rosuvastatin 5 mg/day (a low dose to see if she tolerates). If she agrees to start therapy, repeat a liver and fasting lipid panel in 6 to 8 weeks.  A copy will be sent to Marton Redwood, MD  ------   Notes recorded by Nuala Alpha, LPN on 5/37/4827 at 0:78 PM EDT  CRP reviewed with DOD Dr. Johnsie Cancel. No changes to be made and to forward to Dr. [name] for further review.  ------   Notes recorded by Frederik Schmidt, RN on 02/14/2019 at 4:46 PM EDT  Preliminarily reviewed. Forwarded to MD desktop for review and signature.   ------   Notes recorded by Nuala Alpha, LPN on 6/75/4492 at 0:10 PM EDT  Lipo A and CRP pending on 7/14 at 4:46 pm.

## 2019-02-16 NOTE — Telephone Encounter (Signed)
Error

## 2019-02-23 ENCOUNTER — Encounter (HOSPITAL_COMMUNITY): Payer: Self-pay

## 2019-02-23 ENCOUNTER — Other Ambulatory Visit: Payer: Self-pay

## 2019-02-23 ENCOUNTER — Emergency Department (HOSPITAL_COMMUNITY)
Admission: EM | Admit: 2019-02-23 | Discharge: 2019-02-23 | Disposition: A | Discharge status: Home / Self Care | Payer: PRIVATE HEALTH INSURANCE | Attending: Emergency Medicine | Admitting: Emergency Medicine

## 2019-02-23 DIAGNOSIS — Z20828 Contact with and (suspected) exposure to other viral communicable diseases: Secondary | ICD-10-CM | POA: Diagnosis not present

## 2019-02-23 DIAGNOSIS — Z20822 Contact with and (suspected) exposure to covid-19: Secondary | ICD-10-CM

## 2019-02-23 DIAGNOSIS — I10 Essential (primary) hypertension: Secondary | ICD-10-CM | POA: Insufficient documentation

## 2019-02-23 DIAGNOSIS — M797 Fibromyalgia: Secondary | ICD-10-CM | POA: Diagnosis not present

## 2019-02-23 DIAGNOSIS — J029 Acute pharyngitis, unspecified: Secondary | ICD-10-CM | POA: Insufficient documentation

## 2019-02-23 DIAGNOSIS — J45909 Unspecified asthma, uncomplicated: Secondary | ICD-10-CM | POA: Diagnosis not present

## 2019-02-23 DIAGNOSIS — Z79899 Other long term (current) drug therapy: Secondary | ICD-10-CM | POA: Insufficient documentation

## 2019-02-23 LAB — SARS CORONAVIRUS 2 BY RT PCR (HOSPITAL ORDER, PERFORMED IN ~~LOC~~ HOSPITAL LAB): SARS Coronavirus 2: NEGATIVE

## 2019-02-23 NOTE — ED Provider Notes (Signed)
[facility] DEPT Provider Note   CSN: [id number] Arrival date & time: 02/23/19  1334     History   Chief Complaint Chief Complaint  Patient presents with  . Sore Throat  . covid exposure    HPI [name] [name] is a 52 y.o. female who presents emergency department chief complaint of coronavirus exposure.  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  She states that he was in her face and frequently without his mask so she could hear him better.  She also was having significant nausea due to inner ear issues and frequently had to remove her mask so she would not throw up in into it.  She has had mild sore throat, stuffy nose, tongues pain, mild cough without fever.  Her exposure first occurred 9 days ago and then she had re-exposure over the next few days.  She denies any known fevers.  Denies shortness of breath.     HPI  Past Medical History:  Diagnosis Date  . Anemia    many years ago  . Anxiety    situational  . Arthritis    "hands" (07/12/2012)  . Asthma    seasonal   . Chest pain   . Chest pain at rest, on going 07/12/2012  . Family history of early CAD 07/12/2012  . Fibromyalgia   . GERD (gastroesophageal reflux disease)   . Headache(784.0)    "often; not daily" (07/12/2012)  . Hypertension    not on medications  . Kidney stones   . Migraine   . Pneumonia    as a child  . PONV (postoperative nausea and vomiting)    she states she gets very sick    Patient Active Problem List   Diagnosis Date Noted  . Upper airway cough syndrome 02/06/2019  . DOE (dyspnea on exertion) 02/06/2019  . Morbid obesity ([place]) 11/28/2018  . 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 Tinnitus aurium, unspecified laterality 11/28/2018  . New daily persistent headache 11/28/2018  . OSA on CPAP 11/28/2018  . Lipoedema  03/14/2017  . Blurring of vision 03/14/2017  . Class 2 obesity with body mass index (BMI) of 38.0 to 38.9 in adult 03/14/2017  . Fibromyalgia 11/09/2016  . History of migraine 11/09/2016  . History of environmental allergies 11/09/2016  . Abdominal apron 10/21/2016  . Myalgia 05/13/2016  . Adjustment insomnia 05/13/2016  . Snoring 05/13/2016  . 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 Chest pain at rest, on going, probable GI source 07/12/2012  . HTN (hypertension) 07/12/2012  . Family history of early CAD 07/12/2012  . Anxiety 07/12/2012    Past Surgical History:  Procedure Laterality Date  . breast lift    . CARDIAC CATHETERIZATION  07/11/2012  . CHOLECYSTECTOMY N/A 09/18/2015   Procedure: LAPAROSCOPIC CHOLECYSTECTOMY;  Surgeon: Coralie Keens, MD;  Location: [facility];  Service: General;  Laterality: N/A;  . LEFT HEART CATHETERIZATION WITH CORONARY ANGIOGRAM N/A 07/11/2012   Procedure: LEFT HEART CATHETERIZATION WITH CORONARY ANGIOGRAM;  Surgeon: Lorretta Harp, MD;  Location: [facility] CATH LAB;  Service: Cardiovascular;  Laterality: N/A;  . REDUCTION MAMMAPLASTY Bilateral 10+ years ago  . TONSILLECTOMY  ~ 1976  . tubes and ovaries removed  2015  . VAGINAL HYSTERECTOMY  ~ 2009     OB History   No obstetric history on file.      Home  Medications    Prior to Admission medications   Medication Sig Start Date End Date Taking? Authorizing Provider  albuterol (PROAIR HFA) 108 (90 Base) MCG/ACT inhaler Inhale 2 puffs into the lungs 2 (two) times daily as needed for wheezing or shortness of breath.    [provider]  EPINEPHrine (EPIPEN 2-PAK) 0.3 mg/0.3 mL IJ SOAJ injection Inject 0.3 mLs (0.3 mg total) into the muscle as needed. 01/30/18   Carmin Muskrat, MD  Estradiol 0.75 MG/1.25 GM (0.06%) topical gel Place 1.25 g onto the skin daily. Patient not taking: Reported on 02/06/2019 04/25/18   Dohmeier, Asencion Partridge, MD  meclizine (ANTIVERT) 25 MG tablet Take 1 tablet (25 mg total) by  mouth 3 (three) times daily as needed for dizziness. 02/08/19   Dohmeier, Asencion Partridge, MD  montelukast (SINGULAIR) 10 MG tablet Take 10 mg by mouth daily.    [provider]  Multiple Vitamins-Calcium (ONE-A-DAY WOMENS PO) Take 1 tablet by mouth daily.    [provider]  olmesartan (BENICAR) 20 MG tablet Take 20 mg by mouth daily. 11/03/18   [provider]  ondansetron (ZOFRAN ODT) 4 MG disintegrating tablet Take 1 tablet (4 mg total) by mouth every 8 (eight) hours as needed for nausea or vomiting. 08/19/17   Francine Graven, DO  RABEprazole (ACIPHEX) 20 MG tablet Take 20 mg by mouth 2 (two) times daily.    [provider]  rizatriptan (MAXALT-MLT) 10 MG disintegrating tablet Take 10 mg by mouth every 2 (two) hours as needed for migraine.  06/18/15   [provider]  Vitamin D, Ergocalciferol, (DRISDOL) 50000 units CAPS capsule Take 50,000 Units by mouth every Wednesday.     [provider]    Family History Family History  Problem Relation Age of Onset  . Hiatal hernia Mother   . Hypertension Mother   . Depression Mother   . Heart disease Father   . Stroke Maternal Grandfather   . Chronic Renal Failure Paternal Grandmother   . Drug abuse Son     Social History Social History   Tobacco Use  . Smoking status: Never Smoker  . Smokeless tobacco: Never Used  Substance Use Topics  . Alcohol use: No    Frequency: Never  . Drug use: No     Allergies   Contrast media [iodinated diagnostic agents], Morphine and related, Prednisone, Yellow jacket venom [bee venom], Azithromycin, Erythromycin, Iohexol, Oxycontin [oxycodone], Avelox [moxifloxacin hcl in nacl], Codeine, Gadolinium derivatives, Levaquin [levofloxacin in d5w], Oxycodone-acetaminophen, Pantoprazole, Augmentin [amoxicillin-pot clavulanate], Dilaudid [hydromorphone hcl], Oxycodone hcl, and Tramadol   Review of Systems Review of Systems Ten systems reviewed and are negative for  acute change, except as noted in the HPI.    Physical Exam Updated Vital Signs BP (!) 147/98 (BP Location: Right Arm)   Pulse 84   Temp 99.2 F (37.3 C) (Oral)   Resp 20   Ht 5\' 5"  (1.651 m)   Wt 88.5 kg   SpO2 97%   BMI 32.45 kg/m   Physical Exam Vitals signs and nursing note reviewed.  Constitutional:      General: She is not in acute distress.    Appearance: She is well-developed. She is not diaphoretic.  HENT:     Head: Normocephalic and atraumatic.     Mouth/Throat:     Mouth: Mucous membranes are moist.  Eyes:     General: No scleral icterus.    Conjunctiva/sclera: Conjunctivae normal.  Neck:     Musculoskeletal: Normal range of  motion.  Cardiovascular:     Rate and Rhythm: Normal rate and regular rhythm.     Heart sounds: Normal heart sounds. No murmur. No friction rub. No gallop.   Pulmonary:     Effort: Pulmonary effort is normal. No respiratory distress.     Breath sounds: Normal breath sounds.  Abdominal:     General: Bowel sounds are normal. There is no distension.     Palpations: Abdomen is soft. There is no mass.     Tenderness: There is no abdominal tenderness. There is no guarding.  Skin:    General: Skin is warm and dry.  Neurological:     Mental Status: She is alert and oriented to person, place, and time.  Psychiatric:        Behavior: Behavior normal.      ED Treatments / Results  Labs (all labs ordered are listed, but only abnormal results are displayed) Labs Reviewed  SARS CORONAVIRUS 2 (HOSPITAL ORDER, [facility])    EKG None  Radiology No results found.  Procedures Procedures (including critical care time)  Medications Ordered in ED Medications - No data to display   Initial Impression / Assessment and Plan / ED Course  I have reviewed the triage vital signs and the nursing notes.  Pertinent labs & imaging results that were available during my care of the patient were reviewed by me and  considered in my medical decision making (see chart for details).        Patient work-up negative for coronavirus.  She is hemodynamically stable, afebrile without abnormal respiration or hypoxia [name] [name] was evaluated in Emergency Department on 02/23/2019 for the symptoms described in the history of present illness. She was evaluated in the context of the global COVID-19 pandemic, which necessitated consideration that the patient might be at risk for infection with the SARS-CoV-2 virus that causes COVID-19. Institutional protocols and algorithms that pertain to the evaluation of patients at risk for COVID-19 are in a state of rapid change based on information released by regulatory bodies including the CDC and federal and state organizations. These policies and algorithms were followed during the patient's care in the ED.   Final Clinical Impressions(s) / ED Diagnoses   Final diagnoses:  None    ED Discharge Orders    None       Margarita Mail, PA-C 02/23/19 1708    Daleen Bo, MD 03/01/19 1820

## 2019-02-23 NOTE — ED Triage Notes (Signed)
Pt reports that she was seen at ENT over the past 2 days, and was getting tested done and reports that she was in close proximity with a worker that tested positive for covid 19. Pt reports was referred by MD to get tested. Pt states that she has now has some mild GI symptoms, sore throat

## 2019-02-23 NOTE — Telephone Encounter (Signed)
Called but no answer and Mailbox full. Will try again. This is second attempt.

## 2019-02-26 LAB — NOVEL CORONAVIRUS, NAA: SARS-CoV-2, NAA: NOT DETECTED

## 2019-03-12 ENCOUNTER — Telehealth: Payer: Self-pay | Admitting: Internal Medicine

## 2019-03-13 ENCOUNTER — Other Ambulatory Visit: Payer: Self-pay | Admitting: Internal Medicine

## 2019-03-14 NOTE — Telephone Encounter (Signed)
Patient scheduled for pft on 03/23/2019-pr

## 2019-03-19 ENCOUNTER — Other Ambulatory Visit (HOSPITAL_COMMUNITY)
Admission: RE | Admit: 2019-03-19 | Discharge: 2019-03-19 | Disposition: A | Discharge status: Home / Self Care | Payer: PRIVATE HEALTH INSURANCE | Source: Ambulatory Visit | Attending: Internal Medicine | Admitting: Internal Medicine

## 2019-03-19 DIAGNOSIS — Z20828 Contact with and (suspected) exposure to other viral communicable diseases: Secondary | ICD-10-CM | POA: Insufficient documentation

## 2019-03-19 DIAGNOSIS — Z01812 Encounter for preprocedural laboratory examination: Secondary | ICD-10-CM | POA: Insufficient documentation

## 2019-03-19 LAB — SARS CORONAVIRUS 2 (TAT 6-24 HRS): SARS Coronavirus 2: NEGATIVE

## 2019-03-22 ENCOUNTER — Other Ambulatory Visit: Payer: Self-pay | Admitting: *Deleted

## 2019-03-22 DIAGNOSIS — R05 Cough: Secondary | ICD-10-CM

## 2019-03-22 DIAGNOSIS — R058 Other specified cough: Secondary | ICD-10-CM

## 2019-03-22 DIAGNOSIS — R0609 Other forms of dyspnea: Secondary | ICD-10-CM

## 2019-03-23 ENCOUNTER — Other Ambulatory Visit: Payer: Self-pay

## 2019-03-23 ENCOUNTER — Ambulatory Visit (INDEPENDENT_AMBULATORY_CARE_PROVIDER_SITE_OTHER): Payer: PRIVATE HEALTH INSURANCE | Admitting: Internal Medicine

## 2019-03-23 DIAGNOSIS — R0609 Other forms of dyspnea: Secondary | ICD-10-CM

## 2019-03-23 DIAGNOSIS — R058 Other specified cough: Secondary | ICD-10-CM

## 2019-03-23 DIAGNOSIS — R05 Cough: Secondary | ICD-10-CM

## 2019-03-23 LAB — PULMONARY FUNCTION TEST
DL/VA % pred: 129 %
DL/VA: 5.51 ml/min/mmHg/L
DLCO unc % pred: 111 %
DLCO unc: 23.91 ml/min/mmHg
FEF 25-75 Post: 2.81 L/sec
FEF 25-75 Pre: 3.2 L/sec
FEF2575-%Change-Post: -12 %
FEF2575-%Pred-Post: 104 %
FEF2575-%Pred-Pre: 118 %
FEV1-%Change-Post: -5 %
FEV1-%Pred-Post: 91 %
FEV1-%Pred-Pre: 96 %
FEV1-Post: 2.58 L
FEV1-Pre: 2.72 L
FEV1FVC-%Change-Post: 0 %
FEV1FVC-%Pred-Pre: 107 %
FEV6-%Change-Post: -5 %
FEV6-%Pred-Post: 85 %
FEV6-%Pred-Pre: 90 %
FEV6-Post: 2.98 L
FEV6-Pre: 3.17 L
FEV6FVC-%Change-Post: 0 %
FEV6FVC-%Pred-Post: 102 %
FEV6FVC-%Pred-Pre: 102 %
FVC-%Change-Post: -6 %
FVC-%Pred-Post: 82 %
FVC-%Pred-Pre: 88 %
FVC-Post: 2.99 L
FVC-Pre: 3.18 L
Post FEV1/FVC ratio: 86 %
Post FEV6/FVC ratio: 100 %
Pre FEV1/FVC ratio: 86 %
Pre FEV6/FVC Ratio: 100 %
RV % pred: 96 %
RV: 1.83 L
TLC % pred: 95 %
TLC: 4.98 L

## 2019-03-23 NOTE — Progress Notes (Signed)
Full PFT performed today. °

## 2019-03-26 ENCOUNTER — Ambulatory Visit: Payer: PRIVATE HEALTH INSURANCE | Admitting: Internal Medicine

## 2019-03-26 ENCOUNTER — Encounter: Payer: Self-pay | Admitting: Internal Medicine

## 2019-03-26 ENCOUNTER — Other Ambulatory Visit: Payer: Self-pay

## 2019-03-26 DIAGNOSIS — R05 Cough: Secondary | ICD-10-CM

## 2019-03-26 DIAGNOSIS — R058 Other specified cough: Secondary | ICD-10-CM

## 2019-03-26 DIAGNOSIS — R0609 Other forms of dyspnea: Secondary | ICD-10-CM | POA: Diagnosis not present

## 2019-03-26 NOTE — Progress Notes (Signed)
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     History of Present Illness  02/06/2019  Pulmonary/ 1st office eval/[facility]  Chief Complaint  Patient presents with  . Pulmonary Consult    Referred by Dr. Marton Redwood for SOB. Pt c/o SOB since Sept 2019.   Dyspnea: walks outside x 2 hours x brisk  Pace has some doe with hills, carrying 10 lb / never checks sats  Also has pattern of resting chest tightness 10 min - 2 hour sev times week s assoc light headed s assoc  Nausea  Or  diaphoresis   Cough: pnds x years, also now cough with exertion maybe worse in cold air > non-productive  Sleep: no resp symptoms but cpap started march 2020 per Dohmeier  SABA use: ? Some better p rx  Sensation of choking x summer 2020 mostly with drinking fluids any temp s loss of voice  rec Aciphex 20mg  Take 30- 60 min before your first and last meals of the day  GERD (REFLUX) For drainage / throat tickle try take CHLORPHENIRAMINE  4 mg  (Chlortab 4mg   at [employer] should be easiest to find in the green box)  take one every 4 hours as needed - available over the counter- may cause drowsiness so start with just a bedtime dose or two and see how you tolerate it before trying in daytime  (instead of xyzol)    03/26/2019  f/u ov/[name] [name] re: sob and chest tightness  Chief Complaint  Patient presents with  . Follow-up    Review PFT's. She states her breathing has been worse since her last visit.    Dyspnea:  Walking outside x 45 min outside brisk pace / coughs if humid / chest feels getting more and more "constricted" by her lipomas  Cough: dry  Day not noct, sporadic  Sleeping: propped up about 10-20 degrees on  cpap  SABA use: not helping sense of chest pressure  02: none   No obvious day to day or daytime variability or assoc excess/ purulent sputum or mucus plugs or hemoptysis  subjective wheeze or overt sinus or hb symptoms.   Sleeping ok  without nocturnal  or early am exacerbation  of respiratory  c/o's or need for noct saba. Also denies any obvious fluctuation of symptoms with weather or environmental changes or other aggravating or alleviating factors except as outlined above   No unusual exposure hx or h/o childhood pna/ asthma or knowledge of premature birth.  Current Allergies, Complete Past Medical History, Past Surgical History, Family History, and Social History were reviewed in [employer] record.  ROS  The following are not active complaints unless bolded Hoarseness, sore throat, dysphagia, dental problems, itching, sneezing,  nasal congestion or discharge of excess mucus or purulent secretions, ear ache,   fever, chills, sweats, unintended wt loss or wt gain ,  classically pleuritic or exertional cp,  orthopnea pnd or arm/hand swelling  or leg swelling, presyncope, palpitations, abdominal pain, anorexia, nausea, vomiting, diarrhea  or change in bowel habits or change in bladder habits, change in stools or change in urine, dysuria, hematuria,  rash, arthralgias, visual complaints, headache, numbness, weakness or ataxia or problems with walking or coordination,  change in mood or  memory.        Current Meds  Medication Sig  . albuterol (PROAIR HFA) 108 (90 Base) MCG/ACT inhaler Inhale 2 puffs into the lungs 2 (two) times daily as needed for wheezing or shortness of breath.  . EPINEPHrine (EPIPEN 2-PAK) 0.3 mg/0.3 mL IJ SOAJ injection Inject 0.3 mLs  (0.3 mg total) into the muscle as needed.  . Estradiol 0.75 MG/1.25 GM (0.06%) topical gel Place 1.25 g onto the skin daily.  . meclizine (ANTIVERT) 25 MG tablet Take 1 tablet (25 mg total) by mouth 3 (three) times daily as needed for dizziness.  . montelukast (SINGULAIR) 10 MG tablet Take 10 mg by mouth daily.  . Multiple Vitamins-Calcium (ONE-A-DAY WOMENS PO) Take 1 tablet by mouth daily.  Marland Kitchen olmesartan (BENICAR) 20 MG tablet Take 20 mg by mouth daily.  . ondansetron (ZOFRAN ODT) 4 MG disintegrating tablet Take 1 tablet (4 mg total) by mouth every 8 (eight) hours as needed for nausea or vomiting.  . RABEprazole (ACIPHEX) 20 MG tablet Take 20 mg by mouth 2 (two) times daily.  . rizatriptan (MAXALT-MLT) 10 MG disintegrating tablet Take 10 mg by mouth every 2 (two) hours as needed for migraine.   . Vitamin D, Ergocalciferol, (DRISDOL) 50000 units CAPS capsule Take 50,000 Units by mouth every Wednesday.                 Past Medical History:  Diagnosis Date  . Anemia    many years ago  . Anxiety    situational  . Arthritis    "hands" (07/12/2012)  . Asthma    seasonal   . Chest pain   . Chest pain at rest, on going 07/12/2012  . Family history of early CAD 07/12/2012  . Fibromyalgia   . GERD (gastroesophageal reflux disease)   . Headache(784.0)    "often; not daily" (07/12/2012)  . Hypertension    not on medications  . Kidney stones   . Migraine   . Pneumonia    as a child  . PONV (postoperative nausea and vomiting)    she states she gets very sick      Objective:    amb wf nad   Wt Readings from Last 3 Encounters:  03/26/19 220 lb (99.8 kg)  02/23/19 195 lb (88.5 kg)  02/06/19 218 lb 6.4 oz (99.1 kg)     Vital signs reviewed - Note on arrival 02 sats  97% on RA  HEENT: nl dentition, turbinates bilaterally, and oropharynx. Nl external ear canals without cough reflex   NECK :  without JVD/Nodes/TM/ nl carotid upstrokes bilaterally   LUNGS: no acc muscle use,   Nl contour chest which is clear to A and P bilaterally without cough on insp or exp maneuvers   CV:  RRR  no s3 or murmur or increase in P2, and no edema   ABD:  Obese soft and nontender with nl inspiratory excursion in the supine position. No bruits or organomegaly appreciated, bowel sounds nl  MS:  Nl gait/ ext warm without deformities, calf tenderness, cyanosis or clubbing No obvious joint restrictions  SKIN: warm and dry without lesions    NEURO:  alert, approp, nl sensorium with  no motor or cerebellar deficits apparent.           I personally reviewed images and agree with radiology impression as follows:   Chest CT 12/29/2018 Mediastinum/Nodes: No mediastinal, hilar, or axillary adenopathy. Trachea and esophagus are unremarkable.  Lungs/Pleura: Linear scarring or atelectasis at the lung bases. 4 mm subpleural nodule along the right minor fissure. No confluent opacities or effusions.  Upper Abdomen: Imaging into the upper abdomen shows no acute findings. Prior cholecystectomy.             Assessment

## 2019-03-26 NOTE — Patient Instructions (Signed)
Your main  "lung"   problem is related very low ERV attributable to your body habitus  No evidence for asthma here but keep the inhaler handy to use as needed    We will be calling you to arrange allergy evaluation by Dr Bruna Potter group.

## 2019-03-27 ENCOUNTER — Encounter: Payer: Self-pay | Admitting: Internal Medicine

## 2019-03-27 NOTE — Assessment & Plan Note (Signed)
Onset around 2017 at baseline wt 130 - at wt 218: 02/06/2019   Walked RA  3 laps @  approx 228ft each @ very fast pace  stopped due to  End of study, talking whole time s sob, sats at very end dipped to 89%  - d dimer nl 02/06/2019    - 03/26/2019   Walked RA  3 laps @  approx 267ft each @ fast pace  stopped due to  End of study, some sob after stopping while talking / sats 100% at end  - PFTs wnl  03/26/2019 x for ERV 14% c/w body habitus  I had an extended final summary discussion with the patient reviewing all relevant studies completed to date and  lasting 15 to 20 minutes of a 25 minute visit on the following issues:   1) reviewed pfts and suggestion that most of her symptoms are if fact wt gain related   2) can't r/o component of reflux assoc chest tightness with exertion though no evidence of that today  3) next step is CPST to explore doe/ r/o cardiac component and EIA (though strongly doubt latter)   I  directly observed portions of ambulatory 02 saturation study.   Pulmonary f/u is prn

## 2019-03-27 NOTE — Assessment & Plan Note (Addendum)
Onset ? All her life? Better p taking allergy shots 2014 but could not maintain them due to side effects - Allergy profile 02/06/2019 >  Eos 0.1 /  IgE  55 RAST pos ragweed, grass, trees  - trial of 1st gen H1 blockers per guidelines  02/06/2019 > turned cough to "dry"    Some better on 1st gen H1 blockers per guidelines in terms of pnds but now c/o dry cough not noct, strongly doubt asthma > allergic rhinitis still likely cause so refer to allergy at this point and f/u here prn

## 2019-04-22 ENCOUNTER — Other Ambulatory Visit: Payer: Self-pay

## 2019-04-22 ENCOUNTER — Encounter (HOSPITAL_COMMUNITY): Payer: Self-pay | Admitting: Emergency Medicine

## 2019-04-22 ENCOUNTER — Emergency Department (HOSPITAL_COMMUNITY)
Admission: EM | Admit: 2019-04-22 | Discharge: 2019-04-22 | Disposition: A | Discharge status: Home / Self Care | Payer: PRIVATE HEALTH INSURANCE | Attending: Emergency Medicine | Admitting: Emergency Medicine

## 2019-04-22 ENCOUNTER — Emergency Department (HOSPITAL_COMMUNITY): Payer: PRIVATE HEALTH INSURANCE

## 2019-04-22 DIAGNOSIS — Y999 Unspecified external cause status: Secondary | ICD-10-CM | POA: Diagnosis not present

## 2019-04-22 DIAGNOSIS — Y939 Activity, unspecified: Secondary | ICD-10-CM | POA: Insufficient documentation

## 2019-04-22 DIAGNOSIS — I10 Essential (primary) hypertension: Secondary | ICD-10-CM | POA: Insufficient documentation

## 2019-04-22 DIAGNOSIS — R42 Dizziness and giddiness: Secondary | ICD-10-CM | POA: Diagnosis not present

## 2019-04-22 DIAGNOSIS — R51 Headache: Secondary | ICD-10-CM | POA: Diagnosis present

## 2019-04-22 DIAGNOSIS — W182XXA Fall in (into) shower or empty bathtub, initial encounter: Secondary | ICD-10-CM | POA: Insufficient documentation

## 2019-04-22 DIAGNOSIS — S0990XA Unspecified injury of head, initial encounter: Secondary | ICD-10-CM

## 2019-04-22 DIAGNOSIS — Y929 Unspecified place or not applicable: Secondary | ICD-10-CM | POA: Diagnosis not present

## 2019-04-22 MED ORDER — ONDANSETRON HCL 4 MG PO TABS
4.0000 mg | ORAL_TABLET | Freq: Once | ORAL | Status: DC
  Filled 2019-04-22: qty 1

## 2019-04-22 MED ORDER — ACETAMINOPHEN 500 MG PO TABS
1000.0000 mg | ORAL_TABLET | Freq: Once | ORAL | Status: AC
  Administered 2019-04-22: 13:00:00 1000 mg via ORAL
  Filled 2019-04-22: qty 2

## 2019-04-22 NOTE — ED Triage Notes (Signed)
Patient c/o dizziness, nausea, and rightsided headache after head injury 6 days ago. Per patient right side of face on spa tub after slipping and falling. Patient believes brief LOC. Denies any anticoagulants.

## 2019-04-22 NOTE — ED Notes (Signed)
Patient transported to CT 

## 2019-04-22 NOTE — ED Provider Notes (Signed)
[facility] EMERGENCY DEPARTMENT Provider Note   CSN: [id number] Arrival date & time: 04/22/19  1207     History   Chief Complaint Chief Complaint  Patient presents with  . Head Injury    HPI Narcisa Lakenzie Stoneburner is a 53 y.o. female.     Patient is a 53 year old female who presents to the emergency department with a complaint of headache and dizziness.  The patient says that about 5 or 6 days ago she slipped in the tub and hit her head.  She states that everything seemed to have gone black, she saw stars for a few seconds, and noted that her coordination was altered.  She required assistance getting out of the tub and to a sitting area.  She injured the right side of her head and eyebrow area.  She had swelling over the area increasing problem with headache.  Patient states that she has history of migraines, but this was different because it felt as though it was a sharp aching pain that left the right forehead area and went into the center of her head.  At times the pain was severe enough to cause her to be nauseated and have to stop what she was doing.  The swelling of the face gradually improved with ice.  However the headache would not respond appropriately to her migraine medications, or to Tylenol.  The patient states that she feels off balance from time to time since the fall, and she feels that her gait is off balance.  She also has sensations that everything is spinning around her at times with certain movements.  There is been nausea, but no actual vomiting.  There is been no loss of bowel or bladder function.  The patient denies any other injury.  She has not had any operations or procedures involving her face or her head.  She denies being on any anticoagulation medications.  She presents now for assistance with this issue.  The history is provided by the patient.  Head Injury Associated symptoms: headache   Associated symptoms: no nausea, no neck pain, no numbness, no  seizures, no tinnitus and no vomiting     Past Medical History:  Diagnosis Date  . Anemia    many years ago  . Anxiety    situational  . Arthritis    "hands" (07/12/2012)  . Asthma    seasonal   . Chest pain   . Chest pain at rest, on going 07/12/2012  . Family history of early CAD 07/12/2012  . Fibromyalgia   . GERD (gastroesophageal reflux disease)   . Headache(784.0)    "often; not daily" (07/12/2012)  . Hypertension    not on medications  . Kidney stones   . Migraine   . Pneumonia    as a child  . PONV (postoperative nausea and vomiting)    she states she gets very sick    Patient Active Problem List   Diagnosis Date Noted  . Upper airway cough syndrome 02/06/2019  . DOE (dyspnea on exertion) 02/06/2019  . Morbid obesity ([place]) 11/28/2018  . 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 Tinnitus aurium, unspecified laterality 11/28/2018  . New daily persistent headache 11/28/2018  . OSA on CPAP 11/28/2018  . Lipoedema 03/14/2017  . Blurring of vision 03/14/2017  . Class 2 obesity with body mass index (BMI) of 38.0 to 38.9 in adult 03/14/2017  . Fibromyalgia 11/09/2016  . History of migraine 11/09/2016  . History of environmental allergies  11/09/2016  . Abdominal apron 10/21/2016  . Myalgia 05/13/2016  . Adjustment insomnia 05/13/2016  . Snoring 05/13/2016  . 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 Chest pain at rest, on going, probable GI source 07/12/2012  . HTN (hypertension) 07/12/2012  . Family history of early CAD 07/12/2012  . Anxiety 07/12/2012    Past Surgical History:  Procedure Laterality Date  . breast lift    . CARDIAC CATHETERIZATION  07/11/2012  . CHOLECYSTECTOMY N/A 09/18/2015   Procedure: LAPAROSCOPIC CHOLECYSTECTOMY;  Surgeon: Coralie Keens, MD;  Location: [place];  Service: General;  Laterality: N/A;  . LEFT HEART CATHETERIZATION WITH CORONARY ANGIOGRAM N/A 07/11/2012   Procedure: LEFT HEART CATHETERIZATION WITH CORONARY  ANGIOGRAM;  Surgeon: Lorretta Harp, MD;  Location: [facility] CATH LAB;  Service: Cardiovascular;  Laterality: N/A;  . REDUCTION MAMMAPLASTY Bilateral 10+ years ago  . TONSILLECTOMY  ~ 1976  . tubes and ovaries removed  2015  . VAGINAL HYSTERECTOMY  ~ 2009     OB History   No obstetric history on file.      Home Medications    Prior to Admission medications   Medication Sig Start Date End Date Taking? Authorizing Provider  albuterol (PROAIR HFA) 108 (90 Base) MCG/ACT inhaler Inhale 2 puffs into the lungs 2 (two) times daily as needed for wheezing or shortness of breath.    [provider]  EPINEPHrine (EPIPEN 2-PAK) 0.3 mg/0.3 mL IJ SOAJ injection Inject 0.3 mLs (0.3 mg total) into the muscle as needed. 01/30/18   Carmin Muskrat, MD  Estradiol 0.75 MG/1.25 GM (0.06%) topical gel Place 1.25 g onto the skin daily. 04/25/18   Dohmeier, Asencion Partridge, MD  meclizine (ANTIVERT) 25 MG tablet Take 1 tablet (25 mg total) by mouth 3 (three) times daily as needed for dizziness. 02/08/19   Dohmeier, Asencion Partridge, MD  montelukast (SINGULAIR) 10 MG tablet Take 10 mg by mouth daily.    [provider]  Multiple Vitamins-Calcium (ONE-A-DAY WOMENS PO) Take 1 tablet by mouth daily.    [provider]  olmesartan (BENICAR) 20 MG tablet Take 20 mg by mouth daily. 11/03/18   [provider]  ondansetron (ZOFRAN ODT) 4 MG disintegrating tablet Take 1 tablet (4 mg total) by mouth every 8 (eight) hours as needed for nausea or vomiting. 08/19/17   Francine Graven, DO  RABEprazole (ACIPHEX) 20 MG tablet Take 20 mg by mouth 2 (two) times daily.    [provider]  rizatriptan (MAXALT-MLT) 10 MG disintegrating tablet Take 10 mg by mouth every 2 (two) hours as needed for migraine.  06/18/15   [provider]  Vitamin D, Ergocalciferol, (DRISDOL) 50000 units CAPS capsule Take 50,000 Units by mouth every Wednesday.     [provider]    Family History Family History   Problem Relation Age of Onset  . Hiatal hernia Mother   . Hypertension Mother   . Depression Mother   . Heart disease Father   . Stroke Maternal Grandfather   . Chronic Renal Failure Paternal Grandmother   . Drug abuse Son     Social History Social History   Tobacco Use  . Smoking status: Never Smoker  . Smokeless tobacco: Never Used  Substance Use Topics  . Alcohol use: No    Frequency: Never  . Drug use: No     Allergies   Contrast media [iodinated diagnostic agents], Morphine and related, Prednisone, Yellow jacket venom [bee venom], Azithromycin, Erythromycin, Iohexol, Oxycontin [oxycodone], Avelox [moxifloxacin hcl in nacl],  Codeine, Gadolinium derivatives, Levaquin [levofloxacin in d5w], Oxycodone-acetaminophen, Pantoprazole, Augmentin [amoxicillin-pot clavulanate], Dilaudid [hydromorphone hcl], Oxycodone hcl, and Tramadol   Review of Systems Review of Systems  Constitutional: Negative for activity change and appetite change.  HENT: Positive for facial swelling. Negative for congestion, ear discharge, ear pain, nosebleeds, rhinorrhea, sneezing and tinnitus.   Eyes: Negative for photophobia, pain and discharge.  Respiratory: Negative for cough, choking, shortness of breath and wheezing.   Cardiovascular: Negative for chest pain, palpitations and leg swelling.  Gastrointestinal: Negative for abdominal pain, blood in stool, constipation, diarrhea, nausea and vomiting.  Genitourinary: Negative for difficulty urinating, dysuria, flank pain, frequency and hematuria.  Musculoskeletal: Negative for back pain, gait problem, myalgias and neck pain.  Skin: Negative for color change, rash and wound.  Neurological: Positive for dizziness, light-headedness and headaches. Negative for seizures, syncope, facial asymmetry, speech difficulty, weakness and numbness.  Hematological: Negative for adenopathy. Does not bruise/bleed easily.  Psychiatric/Behavioral: Negative for agitation,  confusion, hallucinations, self-injury and suicidal ideas. The patient is not nervous/anxious.      Physical Exam Updated Vital Signs BP (!) 142/93 (BP Location: Right Arm)   Pulse 81   Temp 98.5 F (36.9 C)   Resp 18   Ht 5\' 5"  (1.651 m)   Wt 99.8 kg   SpO2 100%   BMI 36.61 kg/m   Physical Exam Vitals signs and nursing note reviewed.  Constitutional:      General: She is not in acute distress.    Appearance: She is well-developed.  HENT:     Head: Normocephalic and atraumatic. No raccoon eyes or Battle's sign.      Right Ear: External ear normal.     Left Ear: External ear normal.  Eyes:     General: No scleral icterus.       Right eye: No discharge.        Left eye: No discharge.     Conjunctiva/sclera: Conjunctivae normal.  Neck:     Musculoskeletal: Neck supple.     Trachea: No tracheal deviation.  Cardiovascular:     Rate and Rhythm: Normal rate and regular rhythm.  Pulmonary:     Effort: Pulmonary effort is normal. No respiratory distress.     Breath sounds: Normal breath sounds. No stridor. No wheezing or rales.  Abdominal:     General: Bowel sounds are normal. There is no distension.     Palpations: Abdomen is soft.     Tenderness: There is no abdominal tenderness. There is no guarding or rebound.  Musculoskeletal:        General: No tenderness.     Comments: There is no palpable step-off of the cervical or thoracic spine area.  There are no hot areas appreciated.  There is full range of motion of upper and lower extremities.  Radial pulses are 2+.  No deformity of upper or lower extremities.  Skin:    General: Skin is warm and dry.     Findings: No rash.  Neurological:     Mental Status: She is alert.     GCS: GCS eye subscore is 4. GCS verbal subscore is 5. GCS motor subscore is 6.     Cranial Nerves: No cranial nerve deficit (no facial droop, extraocular movements intact, no slurred speech).     Sensory: No sensory deficit.     Motor: No abnormal  muscle tone or seizure activity.     Coordination: Coordination normal.     Comments: Cranial nerve III and IV.  Eyelids are symmetrical.  Extraocular movements are intact. #5 sensation to face symmetrical. #7 facial movement symmetrical. 8.  Balance intact.  #10 uvula is midline.  Patient raises the soft palate without problem.  11 shoulder shrug symmetrical.  12 tongue movement is intact.  Grip is symmetrical.  There is no asymmetry of the motor strength of the lower extremities.  Gait is steady with good heel toe coordination.  The patient does state that after standing and walking and returning to the bed that she feels dizzy as though things are spinning and she is off balance.      ED Treatments / Results  Labs (all labs ordered are listed, but only abnormal results are displayed) Labs Reviewed - No data to display  EKG None  Radiology No results found.  Procedures Procedures (including critical care time)  Medications Ordered in ED Medications  ondansetron (ZOFRAN) tablet 4 mg (has no administration in time range)  acetaminophen (TYLENOL) tablet 1,000 mg (has no administration in time range)     Initial Impression / Assessment and Plan / ED Course  I have reviewed the triage vital signs and the nursing notes.  Pertinent labs & imaging results that were available during my care of the patient were reviewed by me and considered in my medical decision making (see chart for details).          Final Clinical Impressions(s) / ED Diagnoses MDm  Blood pressure slightly elevated at 142/93, otherwise vital signs are within normal limits.  Pulse oximetry is 100% on room air.  Within normal limits by my interpretation.  There are no gross neurologic deficits appreciated on examination at this time.  Patient however has some blurring of vision and double vision earlier.  She has headache from the area of the right eyebrow extending into the center of her head, she has some  sensation of poor coordination at times, and she has dizziness with certain movements and change of position.  Will obtain a CT head scan and neck scan.  CT scan of the head and neck are negative for acute changes.  Recheck.  No changes in the neurologic examination.  I have discussed the findings of the examination as well as the findings of the scans with the patient in terms of which he understands.  I have asked her to sit and stand slowly.  To use Tylenol every 4 hours for headache.  Patient states she has medication for nausea at home.  The patient is to see Dr. Gardenia Phlegm for additionally evaluation of possible concussion if the symptoms are not improving.  Patient is in agreement with this plan.   Final diagnoses:  Injury of head, initial encounter  Dizziness    ED Discharge Orders    None       Lily Kocher, Hershal Coria 04/22/19 Orlena Sheldon, MD 04/26/19 1119

## 2019-04-22 NOTE — Discharge Instructions (Addendum)
No acute abnormality is agreed for logic examination.  The CT scan of your head and neck are negative for acute changes.  Please increase fluids.  Use Tylenol extra strength every 4 hours for headache.  Continue with your nausea medicine if needed.  Please change positions slowly.  Please see Dr. Gardenia Phlegm for concussion evaluation if the headache, and dizziness are not improving.  Please return to the emergency department if any worsening of your symptoms, changes in your condition, problems, or concerns.

## 2019-04-23 ENCOUNTER — Telehealth: Payer: Self-pay

## 2019-04-23 NOTE — Telephone Encounter (Signed)
[name] Triage  Phone Number: [phone]        Dr [name]. I had requested a phone call when you had time. Ive not heard back. not started the cholesterol medicine - I had questions about the meds and labs. Was to see you back in two months. We may be approaching that. I tried calling the office on several occasions but the hold time causes me to have to hang up before I can speak to Anyone.  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 Happened again just 30 min ago. Pain still in chest, back.   I have questions about my labs and medicine before I start .  I'd also feel at ease by checking for blockages ... I'm concerned about the dercums disease And my heart, my family history with early heart attacks in 49s and 61s. And my current symptoms . The dizziness/shortness of breath/ chest pain has been ongoing .   Thank you so much  [phone]    Patient sent this message in her Mychart. Called patient about message. Patient wants to know if she is having early signs of heart attack. Explained to patient that jaw and back pain could be signs, but there are other test that would need to be completed (EKG/ labs) to say for sure. Explained to patient this is what they would do if she went to the ED. Patient stated she needs these things addressed today. Patient would like to hear back from a doctor or PA/NP from our office today. Informed patient that a message would be sent to the DOD, PA, and Dr. [name], and we would try to get back to her. Patient is upset due to her husband which is an ER doctor would not help her in figuring out if these symptoms are from a fall she recently had or are they symptoms of heart issues. Patient did have a myoview that was low risk study back in  June. Will forward message to several providers that are on today.

## 2019-04-23 NOTE — Telephone Encounter (Signed)
See Dr. Sofie Hartigan message. Called patient. Made an appointment with Kathyrn Drown tomorrow to be evaluated for new symptoms.

## 2019-04-23 NOTE — Telephone Encounter (Signed)
Error in routing I think Pt followed by Linard Millers

## 2019-04-24 ENCOUNTER — Other Ambulatory Visit: Payer: Self-pay

## 2019-04-24 ENCOUNTER — Encounter: Payer: Self-pay | Admitting: Cardiology

## 2019-04-24 ENCOUNTER — Ambulatory Visit: Payer: PRIVATE HEALTH INSURANCE | Admitting: Cardiology

## 2019-04-24 VITALS — BP 126/84 | HR 80 | Ht 65.0 in | Wt 223.8 lb

## 2019-04-24 DIAGNOSIS — R0789 Other chest pain: Secondary | ICD-10-CM | POA: Diagnosis not present

## 2019-04-24 DIAGNOSIS — I159 Secondary hypertension, unspecified: Secondary | ICD-10-CM | POA: Diagnosis not present

## 2019-04-24 DIAGNOSIS — Z8249 Family history of ischemic heart disease and other diseases of the circulatory system: Secondary | ICD-10-CM | POA: Diagnosis not present

## 2019-04-24 DIAGNOSIS — I10 Essential (primary) hypertension: Secondary | ICD-10-CM

## 2019-04-24 DIAGNOSIS — E882 Lipomatosis, not elsewhere classified: Secondary | ICD-10-CM

## 2019-04-24 NOTE — Progress Notes (Signed)
Cardiology Office Note   Date:  04/24/2019   ID:  Monchell, Adema [birth year], MRN [id number]  PCP:  Janus Molder, NP  Cardiologist:  Dr. [name], MD   Chief Complaint  Patient presents with  . Chest Pain    History of Present Illness: [name] [name] is a 53 y.o. female with a hx of obesity, asthma, hypertension and family h/o heart disease.  Initially referred to Dr. [name] by her husband, Dr.J. Roderic Palau (ED MD) for evaluation of chest pain. 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   Over the last 5 to 8 years she has developed increased weight, subcutaneous lipoma, sleep apnea, and most recently noted lower extremity swelling.  She apparently self diagnosed Dercum's disease/syndrome and was, per last office visit, attempting to get help with this at the [facility].  Today she reports that she was diagnosed at [place] with this disease however has not received much assistance in treatment.  She is currently followed by her primary physician Dr. [name] and neurologist, Dr. [name] who have been helping her most with this disease.   She was last seen by Dr. [name] on 01/23/2019 for noticeable dyspnea on exertion, and intermittent episodes of chest discomfort with most of her concern due to family history of CAD in her father with aortic aneurysm and CAD and in her maternal grandfather with CAD.  She was noted to previously exercise vigorously however her physical activity has worsened after the prognosis of Dercum's disease in which she has become much less active and deconditioned.  She was concerned about the possibility of CAD.  She had prior nuclear testing that was unremarkable, performed by Dr. [name].   Dr. [name] reviewed her previously performed noncontrast chest CT which did not reveal evidence of location.  Patient was thought to to be an ideal candidate for  coronary CTA with FFR however she is allergic to contrast dye with anaphylaxis reaction therefore functional testing with Myoview stress testing was performed.  Stress test performed 01/25/2019, no ST segment deviation>> considered a low risk study without ischemia.  Patient called the office with concern regarding her Dercum's disease and her heart.  She was experiencing sudden bilateral jaw pain and some chest pain/pressure dizziness.  Given CV risk factors including HLD and strong family history of premature CAD CAD/MI would prefer for her to be on preventative therapy however was previously resistant to statin?   Today, she presents for follow-up after an episode of chest discomfort which happened yesterday morning while she was cooking.  Patient states approximately 1 week ago she sustained a mechanical fall in her bathtub in which she hit her face on the side of the tub.  She was seen at [facility] in which she underwent a CT scan of her head that showed no intracranial findings and no acute fracture or static subluxation within the cervical spine.  She has no abrasion or bruising to her face today.  Yesterday, while cooking in her kitchen she states she began having bilateral jaw pain in which she was unable to open her jaw.  The pain then radiated to her back, down her left chest and into her bilateral abdomen.  She states that the jaw and back pain dissipated after about 30 minutes to an hour however the chest pain was ongoing until early this morning.  She initially thought that her symptoms were  related to her fall, muscular in nature.  However given her sustained symptoms, she preferred to be seen in the office for further assessment.  She has many somatic complaints most of which are related to her Dercum's disease (final diagnosis from [facility]).  Her history is fairly complicated to follow.  She reports that over the past several years she has gained a lot of weight in the postmenopausal  which has come with many lipoma formations all over her body including her bilateral arms, legs and thoracic cavity related to her Dercum's disease.  She is worried about the possibility of lipomas pressing on her heart causing her symptoms.  She was seen by Dr. Melvyn Novas in which PFTs were performed.  Dr. Melvyn Novas was unconvinced that lipomas could be causing her symptoms for sob.  She seems very frustrated with her care.  She is very tearful in the office today as she feels that she is put off by many doctors and no one is helping her.  Her neurologist is trying to connect her with someone who specializes in her disease for further assistance.  L  From a cardiac perspective, she underwent a stress testing 01/2019 that was low risk with no ischemic changes. She was thought to be an ideal candidate for coronary CTA however she has many allergies, 1 of which includes an anaphylaxis reaction to contrast dye as well as prednisone.  An EKG was performed today which showed normal sinus rhythm, HR 80 bpm and no acute ischemic changes which is reassuring.  Additionally, she has many questions about CV risk reduction therapies including statins and aspirin.  Previous attempts to start these medications have failed due to patient's reluctant behavior.  She is very concerned that this will have some adverse effects on her Dercum's disease.  She has many questions about this and would prefer to speak with Dr. [name].  I have reassured her that I will share our information today with him for further thoughts going forward.  Is a very complicated situation.  We discussed the possibility of repeating imaging to assure no structural defects from a cardiac perspective.  We will draw troponin today and will follow reassured if this is negative given most recent negative stress test and negative EKG today.  Past Medical History:  Diagnosis Date  . Anemia    many years ago  . Anxiety    situational  . Arthritis    "hands"  (07/12/2012)  . Asthma    seasonal   . Chest pain   . Chest pain at rest, on going 07/12/2012  . Family history of early CAD 07/12/2012  . Fibromyalgia   . GERD (gastroesophageal reflux disease)   . Headache(784.0)    "often; not daily" (07/12/2012)  . Hypertension    not on medications  . Kidney stones   . Migraine   . Pneumonia    as a child  . PONV (postoperative nausea and vomiting)    she states she gets very sick    Past Surgical History:  Procedure Laterality Date  . breast lift    . CARDIAC CATHETERIZATION  07/11/2012  . CHOLECYSTECTOMY N/A 09/18/2015   Procedure: LAPAROSCOPIC CHOLECYSTECTOMY;  Surgeon: Coralie Keens, MD;  Location: [facility];  Service: General;  Laterality: N/A;  . LEFT HEART CATHETERIZATION WITH CORONARY ANGIOGRAM N/A 07/11/2012   Procedure: LEFT HEART CATHETERIZATION WITH CORONARY ANGIOGRAM;  Surgeon: Lorretta Harp, MD;  Location: [facility] CATH LAB;  Service: Cardiovascular;  Laterality: N/A;  . REDUCTION  MAMMAPLASTY Bilateral 10+ years ago  . TONSILLECTOMY  ~ 1976  . tubes and ovaries removed  2015  . VAGINAL HYSTERECTOMY  ~ 2009     Current Outpatient Medications  Medication Sig Dispense Refill  . Ciclopirox 1 % shampoo     . EPINEPHrine (EPIPEN 2-PAK) 0.3 mg/0.3 mL IJ SOAJ injection Inject 0.3 mLs (0.3 mg total) into the muscle as needed. 2 Device 1  . estradiol (ESTRACE) 1 MG tablet     . ketoconazole (NIZORAL) 2 % shampoo     . levocetirizine (XYZAL) 5 MG tablet Take 5 mg by mouth every evening.    . montelukast (SINGULAIR) 10 MG tablet Take 10 mg by mouth daily.    . Multiple Vitamins-Calcium (ONE-A-DAY WOMENS PO) Take 1 tablet by mouth daily.    Marland Kitchen olmesartan (BENICAR) 20 MG tablet Take 20 mg by mouth daily.    . ondansetron (ZOFRAN ODT) 4 MG disintegrating tablet Take 1 tablet (4 mg total) by mouth every 8 (eight) hours as needed for nausea or vomiting. 6 tablet 0  . RABEprazole (ACIPHEX) 20 MG tablet Take 20 mg by mouth 2 (two) times daily.     . rizatriptan (MAXALT-MLT) 10 MG disintegrating tablet Take 10 mg by mouth every 2 (two) hours as needed for migraine.   11  . Vitamin D, Ergocalciferol, (DRISDOL) 50000 units CAPS capsule Take 50,000 Units by mouth every Wednesday.      No current facility-administered medications for this visit.     Allergies:   Contrast media [iodinated diagnostic agents], Morphine and related, Prednisone, Yellow jacket venom [bee venom], Azithromycin, Erythromycin, Iohexol, Oxycontin [oxycodone], Avelox [moxifloxacin hcl in nacl], Codeine, Gadolinium derivatives, Levaquin [levofloxacin in d5w], Oxycodone-acetaminophen, Pantoprazole, Augmentin [amoxicillin-pot clavulanate], Dilaudid [hydromorphone hcl], Oxycodone hcl, and Tramadol    Social History:  The patient  reports that she has never smoked. She has never used smokeless tobacco. She reports that she does not drink alcohol or use drugs.   Family History:  The patient's family history includes Chronic Renal Failure in her paternal grandmother; Depression in her mother; Drug abuse in her son; Heart disease in her father; Hiatal hernia in her mother; Hypertension in her mother; Stroke in her maternal grandfather.    ROS:  Please see the history of present illness.  Otherwise, review of systems are positive for none.   All other systems are reviewed and negative.    PHYSICAL EXAM: VS:  BP 126/84   Pulse 80   Ht 5\' 5"  (1.651 m)   Wt 223 lb 12.8 oz (101.5 kg)   SpO2 98%   BMI 37.24 kg/m  , BMI Body mass index is 37.24 kg/m.   General: Well developed, well nourished, NAD Neck: Negative for carotid bruits. No JVD Lungs:Clear to ausculation bilaterally. No wheezes, rales, or rhonchi. Breathing is unlabored. Cardiovascular: RRR with S1 S2. No murmur Extremities: Mild 1+ BLE edema. No clubbing or cyanosis. DP pulses 2+ bilaterally Neuro: Alert and oriented. No focal deficits. No facial asymmetry. MAE spontaneously. Psych: Responds to questions  appropriately with normal affect.     EKG:  EKG is ordered today. The ekg ordered today demonstrates NSR with no acute ischemic changes, similar to prior tracings, HR 80 bpm   Recent Labs: 11/01/2018: BUN 14; Creatinine, Ser 0.91; Potassium 3.6; Sodium 141 02/06/2019: Hemoglobin 13.4; Platelets 286.0 02/13/2019: ALT 13    Lipid Panel    Component Value Date/Time   CHOL 233 (H) 02/13/2019 1121   TRIG 208 (H)  02/13/2019 1121   HDL 63 02/13/2019 1121   CHOLHDL 3.7 02/13/2019 1121   LDLCALC 128 (H) 02/13/2019 1121     Wt Readings from Last 3 Encounters:  04/24/19 223 lb 12.8 oz (101.5 kg)  04/22/19 220 lb 0.3 oz (99.8 kg)  03/26/19 220 lb (99.8 kg)     Other studies Reviewed: Additional studies/ records that were reviewed today include:    Lexiscan Myoview stress test 01/25/2019:   Nuclear stress EF: 64%.  No T wave inversion was noted during stress.  There was no ST segment deviation noted during stress.  The study is normal.  This is a low risk study.    CHEST CT 12/2018: FINDINGS: Cardiovascular: Heart is normal size. Aorta is normal caliber. IMPRESSION: 4 mm nodule along the right minor fissure. No follow-up needed if patient is low-risk. Non-contrast chest CT can be considered in 12 months if patient is high-risk. This recommendation follows the consensus statement: Guidelines for Management of Incidental Pulmonary Nodules Detected on CT Images: From the Fleischner Society 2017; Radiology 2017; 284:228-243.  No acute cardiopulmonary disease.   ECHOCARDIOGRAM 2019: Study Conclusions  - Left ventricle: The cavity size was normal. Wall thickness was increased in a pattern of mild LVH. Systolic function was normal. The estimated ejection fraction was in the range of 55% to 60%. Wall motion was normal; there were no regional wall motion abnormalities.  ASSESSMENT AND PLAN:  1.  Chest pain: -Previously underwent a Myoview stress test 01/25/2019  with no ST segment deviation or ischemia noted, considered low risk study -EKG today with normal sinus rhythm, HR 80 bpm and no acute ischemic changes. -Will obtain troponin level and if elevated will need to consider cath however high risk due to contrast dye allergy with anaphylaxis reaction -Low suspicion for ACS at this point given negative cardiac work-up thus far.  Unclear if this is related to her genetic Dercum's disease versus other somatic disorder -Has many questions about CV risk reduction therapies including statin and aspirin therapies.  Concern that both would have adverse effects with her Dercum's disease.  Discussed that we will rule out cardiac involvement with her most recent symptoms and then discuss these therapies at a later time. -On Benicar 20 mg -Close follow-up with Dr. [name] next week  2.  Essential hypertension: -Underwent echocardiogram that per no review, was suggestive of very mild concentric hypertrophy versus LV thickening therefore consideration of possible cardiac MRI per Dr. Thompson Caul notes -BP stable today at 126/84 -Not a current active issue  3.  Familial hypercholesterolemia: -Significant hyperlipidemia with a total cholesterol 233 with an LDL of 128 and triglycerides at 208. -Statin previously discussed however I do not see this on her medication list and patient is now reluctant to start with many questions as they relate to her Dercum's disease  4.  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  Lipomas are primarily located on the trunk region and all extremities close to the trunk.  Unlike ordinary lipomas associated with Dercums disease are quite painful but can be severe at times and almost debilitating.  Treatment appears to be weight reduction, surgery for the most painful lipomas and medication to control pain. -Neurology is assisting  to find a local specialist -Noncontrast chest CT from 12/29/2018 with no notations of lipoma involvement in the mediastinum or cardiovascular region -There was a 4 mm subpleural lung nodule>> with  follow-up recommended at 12 months if low risk  Current medicines are reviewed at length with the patient today.  The patient does not have concerns regarding medicines.  The following changes have been made:  no change  Labs/ tests ordered today include: Troponin  Orders Placed This Encounter  Procedures  . Troponin I -  . EKG 12-Lead     Disposition:   FU with Dr. [name] in 1 week  Signed, Kathyrn Drown, NP  04/24/2019 4:30 PM    [address], [place], [place]  [address] Phone: [phone]; Fax: [phone]

## 2019-04-24 NOTE — Patient Instructions (Signed)
Medication Instructions:  Your physician recommends that you continue on your current medications as directed. Please refer to the Current Medication list given to you today.  If you need a refill on your cardiac medications before your next appointment, please call your pharmacy.   Lab work: TODAY: TROPONIN  If you have labs (blood work) drawn today and your tests are completely normal, you will receive your results only by: Marland Kitchen MyChart Message (if you have MyChart) OR . A paper copy in the mail If you have any lab test that is abnormal or we need to change your treatment, we will call you to review the results.  Testing/Procedures: NONE  Follow-Up: At [facility], you and your health needs are our priority.  As part of our continuing mission to provide you with exceptional heart care, we have created designated Provider Care Teams.  These Care Teams include your primary Cardiologist (physician) and Advanced Practice Providers (APPs -  Physician Assistants and Nurse Practitioners) who all work together to provide you with the care you need, when you need it. You will need a follow up appointment in 1 weeks with DR. [name]

## 2019-04-25 ENCOUNTER — Telehealth: Payer: Self-pay

## 2019-04-25 LAB — TROPONIN I: Troponin I: <0.01 ng/mL (ref 0.00–0.04)

## 2019-04-25 NOTE — Telephone Encounter (Signed)
The patient has been notified of the result and verbalized understanding.  All questions (if any) were answered. Wilma Flavin, RN 04/25/2019 10:18 AM

## 2019-05-01 ENCOUNTER — Encounter: Payer: Self-pay | Admitting: *Deleted

## 2019-05-01 DIAGNOSIS — Z8249 Family history of ischemic heart disease and other diseases of the circulatory system: Secondary | ICD-10-CM

## 2019-05-01 DIAGNOSIS — I10 Essential (primary) hypertension: Secondary | ICD-10-CM

## 2019-05-01 DIAGNOSIS — E7801 Familial hypercholesterolemia: Secondary | ICD-10-CM

## 2019-05-01 DIAGNOSIS — E882 Lipomatosis, not elsewhere classified: Secondary | ICD-10-CM

## 2019-05-01 MED ORDER — ASPIRIN EC 81 MG PO TBEC: 81.0000 mg | DELAYED_RELEASE_TABLET | Freq: Every day | ORAL | 3 refills | Status: DC

## 2019-05-01 MED ORDER — ROSUVASTATIN CALCIUM 10 MG PO TABS: 10.0000 mg | ORAL_TABLET | Freq: Every day | ORAL | 3 refills | Status: DC

## 2019-05-03 ENCOUNTER — Ambulatory Visit: Payer: PRIVATE HEALTH INSURANCE | Admitting: Allergy

## 2019-05-17 ENCOUNTER — Other Ambulatory Visit: Payer: Self-pay

## 2019-05-17 ENCOUNTER — Ambulatory Visit: Payer: PRIVATE HEALTH INSURANCE | Admitting: Allergy

## 2019-05-17 ENCOUNTER — Encounter: Payer: Self-pay | Admitting: Allergy

## 2019-05-17 VITALS — BP 120/72 | HR 86 | Temp 97.2°F | Resp 19 | Ht 65.0 in | Wt 219.8 lb

## 2019-05-17 DIAGNOSIS — T781XXD Other adverse food reactions, not elsewhere classified, subsequent encounter: Secondary | ICD-10-CM

## 2019-05-17 DIAGNOSIS — Z889 Allergy status to unspecified drugs, medicaments and biological substances status: Secondary | ICD-10-CM

## 2019-05-17 DIAGNOSIS — T7840XA Allergy, unspecified, initial encounter: Secondary | ICD-10-CM | POA: Insufficient documentation

## 2019-05-17 DIAGNOSIS — R05 Cough: Secondary | ICD-10-CM

## 2019-05-17 DIAGNOSIS — E882 Lipomatosis, not elsewhere classified: Secondary | ICD-10-CM

## 2019-05-17 DIAGNOSIS — Z91030 Bee allergy status: Secondary | ICD-10-CM

## 2019-05-17 DIAGNOSIS — R059 Cough, unspecified: Secondary | ICD-10-CM

## 2019-05-17 DIAGNOSIS — J3089 Other allergic rhinitis: Secondary | ICD-10-CM

## 2019-05-17 DIAGNOSIS — T7840XD Allergy, unspecified, subsequent encounter: Secondary | ICD-10-CM

## 2019-05-17 DIAGNOSIS — Z91038 Other insect allergy status: Secondary | ICD-10-CM | POA: Insufficient documentation

## 2019-05-17 MED ORDER — AZELASTINE HCL 0.15 % NA SOLN: 1.0000 | Freq: Two times a day (BID) | NASAL | 5 refills | Status: DC | PRN

## 2019-05-17 NOTE — Progress Notes (Signed)
New Patient Note  RE: [name] [name] MRN: [id number] DOB: 02/21/1966 Date of Office Visit: 05/17/2019  Referring provider: Janus Molder, NP Primary care provider: Janus Molder, NP  Chief Complaint: Allergic Reaction  History of Present Illness: I had the pleasure of seeing [name] [name] for initial evaluation at the [facility] on 05/21/2019. She is a 53 y.o. female, who is referred here by Dr. Melvyn Novas (pulmonology) for the evaluation of allergic reactions.  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  However, she presents to the allergy clinic as she is concerned about her increased allergic reactions to medications, foods, environment and chemicals.   Patient was receiving allergy injections at another local allergist's office for about 3 years but the last year into it she developed large localized reactions. She also stopped them due to her Dercum's disease and she was not sure if the allergy injections were flaring her symptoms.  She does think they were helping her rhinitis symptoms. She does have increased PND. She was also told that she had a deviated septum. She does have a CPAP machine at home.   Allergic reactions: Patient noted increased reactions to food, insects, dyes, and chemicals. Patient is concerned that her "Dercum's disease" is causing her to have allergic reactions as she never had all these issues previously.    Patient got stung by yellow jacket recently and noticed SOB and chest tightness. She also got bitten by ants and broke out in a rash.  She also developed IV dye allergy about 2 years ago. She had no previous issues to IV dye but she had multiple contrasts due to her kidney stone and gallbladder issues. She had a really bad reaction after the contrast and had to be treated in the ER. Her husband is a ER  physician.   Most of her reactions involve respiratory issues such as SOB and chest tightness but she also gets some pruritus and slight perioral angioedema.   Many of the medications tend to give her GI issues and they are on her allergy list.  Prednisone possibly gave her an anaphylactic reaction but she is not sure as she was taking it to help with an allergic reaction.  Her other non-allergic symptoms are: weight gain despite strict caloric intake of about 1200 cal/day. Exercise tends to make her Dercum's disease worse.   Patient has gone through menopause.   She is being followed by dermatology, pulmonology, neurology, cardiologist.  Patient has not seen endocrinologist.   Rhinitis: She reports symptoms of PND, itchy eyes, nasal congestion, rhinorrhea. Symptoms have been going on for many years. The symptoms are present all year around with worsening in fall. Other triggers include exposure to pollen and dust. Anosmia: diminished. Headache: yes. She has used Singulair, xyzal with some improvement in symptoms. Sinus infections: no. Previous work up includes: skin testing about 2-3 years showed multiple positives per patient report. Patient was on allergy injections for 3 years and was developing large localized reactions during maintenance.  Previous ENT evaluation: patient had deviated septum, no recent evaluation.  03/23/2019 PFT: normal.  12/29/2018 CT chest: "4 mm nodule along the right minor fissure. No follow-up needed if patient is low-risk. Non-contrast chest CT can be considered in 12 months if patient is high-risk. This recommendation follows the consensus statement: Guidelines for Management of Incidental Pulmonary Nodules  Detected on CT Images: From the Fleischner Society 2017; Radiology 2017; [phone]. No acute cardiopulmonary disease."  02/06/2019 bloodwork: Ref Range & Units 76mo ago   Allergen, D pternoyssinus,d7 kU/L <0.10   Class  0   D. farinae kU/L <0.10     Class  0   Allergen, P. notatum, m1 kU/L <0.10   Class  0   CLADOSPORIUM HERBARUM (M2) IGE kU/L <0.10   Class  0   Aspergillus fumigatus, m3 kU/L <0.10   Class  0   Allergen, A. alternata, m6 kU/L <0.10   Class  0   Cat Dander kU/L <0.10   Class  0   Dog Dander kU/L <0.10   Class  0   Cockroach kU/L 0.14High    Class  0   Box Elder IgE kU/L 0.12High    Class  0   Allergen, Comm Silver Wendee Copp, t9 kU/L <0.10   Class  0   Allergen, Cedar tree, t12 kU/L 0.13High    Class  0   Allergen, Cottonwood, t14 kU/L 0.11High    Class  0   Allergen, Oak,t7 kU/L 0.14High    Class  0   Elm IgE kU/L 0.13High    Class  0   Pecan/Hickory Tree IgE kU/L <0.10   Class  0   Allergen, Mulberry, t76 kU/L <0.10   Class  0   [place] Grass kU/L 0.12High    Class  0   Timothy Grass kU/L 0.64High    Class  1   Johnson Grass kU/L 0.13High    Class  0   COMMON RAGWEED (SHORT) (W1) IGE kU/L 0.13High    Class  0   Rough Pigweed IgE kU/L <0.10   Class  0   Sheep Sorrel IgE kU/L 0.13High    Class  0   Allergen, Mouse Urine Protein, e78 kU/L <0.10   Class  0   IgE (Immunoglobulin E), Serum <OR=114 kU/L 55    Assessment and Plan: [name] [name] is a 53 y.o. female with: Allergic reaction 53 year old female who presents with a compilation of symptoms but concerned about increasing reactions to various things including foods, chemicals, environment, drugs since her diagnosis of Dercum's disease.  I was unable to find any publications/journals regarding if Dercum's disease affects allergies.   Based on her clinical history, there are some other concerns and discussed that I would like for her to get the bloodwork and urine test to rule out mast cell disorder.  I am also concerned if she is having any type of reactions to the supplements she is taking such as the fish oil and tumeric. She states she had some perioral pruritus with shrimp in the past.   Okay to take calcium or vitamin D.  Do not take any  tumeric or fish oil or any type of fish products.   Take xyzal 5mg  at night.  Stop Singulair as ineffective.   Monitor symptoms.   For mild symptoms you can take over the counter antihistamines such as Benadryl and monitor symptoms closely. If symptoms worsen or if you have severe symptoms including breathing issues, throat closure, significant swelling, whole body hives, severe diarrhea and vomiting, lightheadedness then inject epinephrine and seek immediate medical care afterwards.  Multiple drug allergies Patient is sensitive to various medications. Some of the reactions are more adverse drug reactions rather then IgE mediated reactions.  Continue to avoid medications which gave her issues in the past. See allergy list.   Other  allergic rhinitis Perennial rhino conjunctivitis symptoms for many years with worsening in the fall. Patient was on AIT for 3 years but the last year developed large localized reactions so she stopped the injections. She believes they were helping her symptoms. 2020 immunocap borderline positive to few pollens.   In process of obtaining previous allergy records.  Start azelastine nasal spray 1 spray per nostril twice a day as needed for drainage.  There is history of reaction to steroids so we opted not to use steroid nasal sprays at this time.  Take xyzal 5mg  daily at night.   Given her current clinical history and testing results, I do not believe she is a good candidate for allergy immunotherapy.   Continue environmental control measures to pollen.   Adverse food reaction Currently avoiding gluten and spicy foods as they seem to trigger her Dercum's disease symptoms. Shrimp caused perioral pruritus in the past. No previous food testing.  Ordered some basic food panel testing via bloodwork including fish and shellfish.  Advised her to avoid seafood/shellfish products for now including the fish oil supplements.  Continue to avoid gluten and spicy foods  for now.   For mild symptoms you can take over the counter antihistamines such as Benadryl and monitor symptoms closely. If symptoms worsen or if you have severe symptoms including breathing issues, throat closure, significant swelling, whole body hives, severe diarrhea and vomiting, lightheadedness then inject epinephrine and seek immediate medical care afterwards.  Dercum disease Patient was diagnosed with Dercum's disease by dermatology at [facility].  Discussed with patient that this is very rare disease and I was not aware of any association with Dercum's disease and allergic issues.  I did recommend that she sees an endocrinologist as she has never seen one previously.  She does follow with dermatology, neurology, cardiology and pulmonology.   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   Will get hymenoptera panel.  Patient has Epipen on hand if needed for anaphylactic reactions.   Coughing Patient follows with pulmonology and had full PFTs in the past which were normal. CT chest was unremarkable except for a 1mm nodule.   Monitor symptoms.   Return in about 4 weeks (around 06/14/2019).  Meds ordered this encounter  Medications   Azelastine HCl 0.15 % SOLN    Sig: Place 1 spray into the nose 2 (two) times daily as needed (drainage).    Dispense:  30 mL    Refill:  5    Lab Orders     Allergen Hymenoptera Panel     Tryptase     Chronic Urticaria     CBC with Differential/Platelet     Alpha-Gal Panel     ANA w/Reflex     Allergen Profile, Shellfish     Allergen Profile, Food-Fish     Other/Misc lab test     Other/Misc lab test     Allergen Fire Ant  Other allergy screening: Asthma: unsure  Patient follows with pulmonology. Worse with exertion - coughing and SOB.  Takes albuterol as needed with unknown benefit.   Food allergy: not sure  Currently avoiding  gluten and spicy foods.  Dietary History: patient has been eating other foods including limited milk, limited eggs, peanut, treenuts, sesame, seafood, limited soy, limited wheat, limited meats, fruits and vegetables.  Shrimp causes oral pruritus. She does take a daily fish oil.  Medication allergy: yes see list.  Hymenoptera  allergy: yes see HPI.  Urticaria: yes after bee stings Eczema: yes sometimes  Diagnostics: None.  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   Tinnitus aurium, unspecified laterality 11/28/2018   New daily persistent headache 11/28/2018   OSA on CPAP 11/28/2018   Lipoedema 03/14/2017   Blurring of vision 03/14/2017   Class 2 obesity with body mass index (BMI) of 38.0 to 38.9 in adult 03/14/2017   Fibromyalgia 11/09/2016   History of migraine 11/09/2016   History of environmental allergies 11/09/2016   Abdominal apron 10/21/2016   Myalgia 05/13/2016   Adjustment insomnia 05/13/2016   Snoring 05/13/2016   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   Chest pain at rest, on going, probable GI source 07/12/2012   HTN (hypertension) 07/12/2012   Family history of early CAD 07/12/2012   Anxiety 07/12/2012   Past Medical History:  Diagnosis Date   Anemia    many years ago   Anxiety    situational   Arthritis    "hands" (07/12/2012)   Asthma    seasonal    Chest pain    Chest pain at rest, on going 07/12/2012   Eczema    Family history of early CAD 07/12/2012   Fibromyalgia    GERD (gastroesophageal  reflux disease)    Headache(784.0)    "often; not daily" (07/12/2012)   Hypertension    not on medications   Kidney stones    Migraine    Pneumonia    as a child   PONV (postoperative nausea and vomiting)    she states she gets very sick   Past Surgical History: Past Surgical History:  Procedure Laterality Date   breast lift     CARDIAC CATHETERIZATION  07/11/2012   CHOLECYSTECTOMY N/A 09/18/2015   Procedure: LAPAROSCOPIC CHOLECYSTECTOMY;  Surgeon: Coralie Keens, MD;  Location: [place];  Service: General;  Laterality: N/A;   LEFT HEART CATHETERIZATION WITH CORONARY ANGIOGRAM N/A 07/11/2012   Procedure: LEFT HEART CATHETERIZATION WITH CORONARY ANGIOGRAM;  Surgeon: Lorretta Harp, MD;  Location: [facility] CATH LAB;  Service: Cardiovascular;  Laterality: N/A;   REDUCTION MAMMAPLASTY Bilateral 10+ years ago   TONSILLECTOMY  ~ 1976   tubes and ovaries removed  2015   VAGINAL HYSTERECTOMY  ~ 2009   Medication List:  Current Outpatient Medications  Medication Sig Dispense Refill   aspirin EC 81 MG tablet Take 1 tablet (81 mg total) by mouth daily. 90 tablet 3   Brimonidine Tartrate (MIRVASO) 0.33 % GEL Mirvaso 0.33 % topical gel  APPLY TO FACE 1-2 TIMES DAILY     Ciclopirox 1 % shampoo      EPINEPHrine (EPIPEN 2-PAK) 0.3 mg/0.3 mL IJ SOAJ injection Inject 0.3 mLs (0.3 mg total) into the muscle as needed. 2 Device 1   estradiol (ESTRACE) 1 MG tablet      ketoconazole (NIZORAL) 2 % shampoo      levocetirizine (XYZAL) 5 MG tablet Take 5 mg by mouth every evening.     Multiple Vitamins-Calcium (ONE-A-DAY WOMENS PO) Take 1 tablet by mouth daily.     olmesartan (BENICAR) 20  MG tablet Take 20 mg by mouth daily.     ondansetron (ZOFRAN ODT) 4 MG disintegrating tablet Take 1 tablet (4 mg total) by mouth every 8 (eight) hours as needed for nausea or vomiting. 6 tablet 0   RABEprazole (ACIPHEX) 20 MG tablet Take 20 mg by mouth 2 (two) times daily.     rizatriptan  (MAXALT-MLT) 10 MG disintegrating tablet Take 10 mg by mouth every 2 (two) hours as needed for migraine.   11   Vitamin D, Ergocalciferol, (DRISDOL) 50000 units CAPS capsule Take 50,000 Units by mouth every Wednesday.      Azelastine HCl 0.15 % SOLN Place 1 spray into the nose 2 (two) times daily as needed (drainage). 30 mL 5   rosuvastatin (CRESTOR) 10 MG tablet Take 1 tablet (10 mg total) by mouth daily. (Patient not taking: Reported on 05/17/2019) 90 tablet 3   No current facility-administered medications for this visit.    Allergies: Allergies  Allergen Reactions   Contrast Media [Iodinated Diagnostic Agents] Anaphylaxis   Morphine And Related Anaphylaxis and Nausea And Vomiting   Prednisone Anaphylaxis   Yellow Jacket Venom [Bee Venom] Anaphylaxis   Azithromycin Other (See Comments)    SEVERE STOMACH PAIN    Erythromycin Nausea And Vomiting and Other (See Comments)    SEVERE STOMACH PAIN/abdominal pain    Iohexol Hives, Itching and Swelling    Swelling of upper lip, thick tongue, hives on face and back and itching all over   Oxycontin [Oxycodone] Nausea And Vomiting   Avelox [Moxifloxacin Hcl In Nacl] Swelling   Codeine Nausea And Vomiting and Other (See Comments)    Headaches also   Gadolinium Derivatives Hives, Itching and Swelling   Levaquin [Levofloxacin In D5w] Swelling   Oxycodone-Acetaminophen Nausea And Vomiting   Pantoprazole Other (See Comments)    "Feels like I have the flu"   Augmentin [Amoxicillin-Pot Clavulanate] Nausea And Vomiting   Dilaudid [Hydromorphone Hcl] Nausea And Vomiting   Oxycodone Hcl Nausea And Vomiting   Tramadol Nausea Only   Social History: Social History   Socioeconomic History   Marital status: Married    Spouse name: Not on file   Number of children: Not on file   Years of education: Not on file   Highest education level: Not on file  Occupational History   Not on file  Social Needs   Financial resource  strain: Not on file   Food insecurity    Worry: Not on file    Inability: Not on file   Transportation needs    Medical: Not on file    Non-medical: Not on file  Tobacco Use   Smoking status: Never Smoker   Smokeless tobacco: Never Used  Substance and Sexual Activity   Alcohol use: No    Frequency: Never   Drug use: No   Sexual activity: Yes  Lifestyle   Physical activity    Days per week: Not on file    Minutes per session: Not on file   Stress: Not on file  Relationships   Social connections    Talks on phone: Not on file    Gets together: Not on file    Attends religious service: Not on file    Active member of club or organization: Not on file    Attends meetings of clubs or organizations: Not on file    Relationship status: Not on file  Other Topics Concern   Not on file  Social History Narrative  Not on file   Lives in a 54 year old home. 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 Negative for appetite change, chills and fever.  HENT: Positive for congestion and postnasal drip. Negative for rhinorrhea.   Eyes: Negative for itching.  Respiratory: Positive for chest tightness and shortness of breath. Negative for cough and wheezing.   Cardiovascular: Negative for chest pain.  Gastrointestinal: Negative for abdominal pain.  Genitourinary: Negative  for difficulty urinating.  Skin: Negative for rash.  Allergic/Immunologic: Positive for environmental allergies.  Neurological: Positive for headaches.   Objective: BP 120/72 (BP Location: Right Arm, Patient Position: Sitting, Cuff Size: Large)    Pulse 86    Temp (!) 97.2 F (36.2 C) (Temporal)    Resp 19    Ht 5\' 5"  (1.651 m)    Wt 219 lb 12 oz (99.7 kg)    SpO2 96%    BMI 36.57 kg/m  Body mass index is 36.57 kg/m. Physical Exam  Constitutional: She is oriented to person, place, and time. She appears well-developed and well-nourished.  HENT:  Head: Normocephalic and atraumatic.  Right Ear: External ear normal.  Left Ear: External ear normal.  Nose: Nose normal.  Mouth/Throat: Oropharynx is clear and moist.  Eyes: Conjunctivae and EOM are normal.  Neck: Neck supple.  Cardiovascular: Normal rate, regular rhythm and normal heart sounds. Exam reveals no gallop and no friction rub.  No murmur heard. Pulmonary/Chest: Effort normal and breath sounds normal. She has no wheezes. She has no rales.  Abdominal: Soft.  Neurological: She is alert and oriented to person, place, and time.  Skin: Skin is warm. No rash noted.  Psychiatric: She has a normal mood and affect. Her behavior is normal.  Nursing note and vitals reviewed.  The plan was reviewed with the patient/family, and all questions/concerned were addressed.  It was my pleasure to see [name] [name] today and participate in her care. Please feel free to contact me with any questions or concerns.  Sincerely,  [name], DO Allergy & Immunology  [facility] office: [phone] [facility] office: [place] office: [phone]

## 2019-05-17 NOTE — Patient Instructions (Addendum)
Get bloodwork. Results will be back in 1-2 weeks.  I will contact you about the urine tests - I could not order through our labs.   Stop taking all supplements and only take prescribed medications.  Okay to take with calcium or vitamin D.  Do not take any tumeric or fish oil or any type of fish products.   Take xyzal 5mg  at night. Stop Singulair.  Start azelastine nasal spray 1 spray per nostril twice a day as needed for drainage.  Refer to endocrinology.   Follow up in 4 weeks or sooner if needed.

## 2019-05-18 ENCOUNTER — Telehealth: Payer: Self-pay

## 2019-05-18 NOTE — Telephone Encounter (Signed)
-----   Message from Herbie Drape, LPN sent at [id number]  5:33 PM EDT ----- Can we place a referral to endo for dercum's disease. please

## 2019-05-18 NOTE — Telephone Encounter (Signed)
Looks like the referral is under review with Dr Loanne Drilling. Will keep an eye on this referral for scheduling.   Thanks

## 2019-05-18 NOTE — Telephone Encounter (Signed)
Pt calling, very upset.  Pt states that she was called last night and told to come in and get labs drawn.  Pt states that she is upset that she had to leave work, can't come until after 12, and then was told that our lab person is leaving at 49 today.  Explained to patient that she can go to any [facility] and they can pull up the labs and have them done there. Pt was okay with this answer and will go to the one on [address] today.  Labs have been faxed to the [address] location.

## 2019-05-20 LAB — ALLERGEN FIRE ANT: I070-IgE Fire Ant (Invicta): 0.19 kU/L — AB

## 2019-05-21 ENCOUNTER — Encounter: Payer: Self-pay | Admitting: Allergy

## 2019-05-21 DIAGNOSIS — R05 Cough: Secondary | ICD-10-CM | POA: Insufficient documentation

## 2019-05-21 DIAGNOSIS — T781XXA Other adverse food reactions, not elsewhere classified, initial encounter: Secondary | ICD-10-CM | POA: Insufficient documentation

## 2019-05-21 DIAGNOSIS — R059 Cough, unspecified: Secondary | ICD-10-CM | POA: Insufficient documentation

## 2019-05-21 DIAGNOSIS — J3089 Other allergic rhinitis: Secondary | ICD-10-CM | POA: Insufficient documentation

## 2019-05-21 DIAGNOSIS — E882 Lipomatosis, not elsewhere classified: Secondary | ICD-10-CM | POA: Insufficient documentation

## 2019-05-21 NOTE — Assessment & Plan Note (Addendum)
Currently avoiding gluten and spicy foods as they seem to trigger her Dercum's disease symptoms. Shrimp caused perioral pruritus in the past. No previous food testing.  Ordered some basic food panel testing via bloodwork including fish and shellfish.  Advised her to avoid seafood/shellfish products for now including the fish oil supplements.  Continue to avoid gluten and spicy foods for now.   For mild symptoms you can take over the counter antihistamines such as Benadryl and monitor symptoms closely. If symptoms worsen or if you have severe symptoms including breathing issues, throat closure, significant swelling, whole body hives, severe diarrhea and vomiting, lightheadedness then inject epinephrine and seek immediate medical care afterwards.

## 2019-05-21 NOTE — Assessment & Plan Note (Signed)
Patient follows with pulmonology and had full PFTs in the past which were normal. CT chest was unremarkable except for a 43mm nodule.   Monitor symptoms.

## 2019-05-21 NOTE — Assessment & Plan Note (Signed)
Perennial rhino conjunctivitis symptoms for many years with worsening in the fall. Patient was on AIT for 3 years but the last year developed large localized reactions so she stopped the injections. She believes they were helping her symptoms. 2020 immunocap borderline positive to few pollens.   In process of obtaining previous allergy records.  Start azelastine nasal spray 1 spray per nostril twice a day as needed for drainage.  There is history of reaction to steroids so we opted not to use steroid nasal sprays at this time.  Take xyzal 5mg  daily at night.   Given her current clinical history and testing results, I do not believe she is a good candidate for allergy immunotherapy.   Continue environmental control measures to pollen.

## 2019-05-21 NOTE — Assessment & Plan Note (Addendum)
Patient is sensitive to various medications. Some of the reactions are more adverse drug reactions rather then IgE mediated reactions.  Continue to avoid medications which gave her issues in the past. See allergy list.

## 2019-05-21 NOTE — Assessment & Plan Note (Signed)
Patient was diagnosed with Dercum's disease by dermatology at [facility]  Discussed with patient that this is very rare disease and I was not aware of any association with Dercum's disease and allergic issues.  I did recommend that she sees an endocrinologist as she has never seen one previously.  She does follow with dermatology, neurology, cardiology and pulmonology.

## 2019-05-21 NOTE — Assessment & Plan Note (Addendum)
53 year old female who presents with a compilation of symptoms but concerned about increasing reactions to various things including foods, chemicals, environment, drugs since her diagnosis of Dercum's disease.  I was unable to find any publications/journals regarding if Dercum's disease affects allergies.   Based on her clinical history, there are some other concerns and discussed that I would like for her to get the bloodwork and urine test to rule out mast cell disorder.  I am also concerned if she is having any type of reactions to the supplements she is taking such as the fish oil and tumeric. She states she had some perioral pruritus with shrimp in the past.   Okay to take calcium or vitamin D.  Do not take any tumeric or fish oil or any type of fish products.   Take xyzal 5mg  at night.  Stop Singulair as ineffective.   Monitor symptoms.   For mild symptoms you can take over the counter antihistamines such as Benadryl and monitor symptoms closely. If symptoms worsen or if you have severe symptoms including breathing issues, throat closure, significant swelling, whole body hives, severe diarrhea and vomiting, lightheadedness then inject epinephrine and seek immediate medical care afterwards.

## 2019-05-21 NOTE — Assessment & Plan Note (Signed)
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   Will get hymenoptera panel.  Patient has Epipen on hand if needed for anaphylactic reactions.

## 2019-05-22 ENCOUNTER — Telehealth: Payer: Self-pay

## 2019-05-22 NOTE — Telephone Encounter (Signed)
Called and left a message for patient to call me in the [facility] office in regards to coming into the [place] office to pick up a jug for her 24 hour urine collection testing.

## 2019-05-22 NOTE — Telephone Encounter (Signed)
Noted! Thank you

## 2019-05-25 LAB — ALLERGEN HYMENOPTERA PANEL
Bumblebee: 0.26 kU/L — AB
Honeybee IgE: 0.81 kU/L — AB
Hornet, White Face, IgE: 2.65 kU/L — AB
Hornet, Yellow, IgE: 1.91 kU/L — AB
Paper Wasp IgE: 3.34 kU/L — AB
Yellow Jacket, IgE: 7.71 kU/L — AB

## 2019-05-25 LAB — CBC WITH DIFFERENTIAL/PLATELET
Basophils Absolute: 0 10*3/uL (ref 0.0–0.2)
Basos: 1 %
EOS (ABSOLUTE): 0.1 10*3/uL (ref 0.0–0.4)
Eos: 1 %
Hematocrit: 40.5 % (ref 34.0–46.6)
Hemoglobin: 13.7 g/dL (ref 11.1–15.9)
Immature Grans (Abs): 0 10*3/uL (ref 0.0–0.1)
Immature Granulocytes: 0 %
Lymphocytes Absolute: 2.8 10*3/uL (ref 0.7–3.1)
Lymphs: 37 %
MCH: 31.1 pg (ref 26.6–33.0)
MCHC: 33.8 g/dL (ref 31.5–35.7)
MCV: 92 fL (ref 79–97)
Monocytes Absolute: 0.4 10*3/uL (ref 0.1–0.9)
Monocytes: 6 %
Neutrophils Absolute: 4.2 10*3/uL (ref 1.4–7.0)
Neutrophils: 55 %
Platelets: 287 10*3/uL (ref 150–450)
RBC: 4.4 x10E6/uL (ref 3.77–5.28)
RDW: 11.8 % (ref 11.7–15.4)
WBC: 7.6 10*3/uL (ref 3.4–10.8)

## 2019-05-25 LAB — ALLERGEN PROFILE, FOOD-FISH
Allergen Mackerel IgE: <0.1 kU/L
Allergen Salmon IgE: <0.1 kU/L
Allergen Trout IgE: <0.1 kU/L
Allergen Walley Pike IgE: <0.1 kU/L
Codfish IgE: <0.1 kU/L
Halibut IgE: <0.1 kU/L
Tuna: <0.1 kU/L

## 2019-05-25 LAB — ALLERGEN PROFILE, SHELLFISH
Clam IgE: <0.1 kU/L
F023-IgE Crab: <0.1 kU/L
F080-IgE Lobster: <0.1 kU/L
F290-IgE Oyster: 0.1 kU/L — AB
Scallop IgE: 0.12 kU/L — AB
Shrimp IgE: <0.1 kU/L

## 2019-05-25 LAB — TRYPTASE: Tryptase: 2.7 ug/L (ref 2.2–13.2)

## 2019-05-25 LAB — CHRONIC URTICARIA: cu index: 2.3 (ref <10)

## 2019-05-25 LAB — ALPHA-GAL PANEL
Alpha Gal IgE*: <0.1 kU/L (ref <0.10)
Beef (Bos spp) IgE: <0.1 kU/L (ref <0.35)
Class Interpretation: 0
Class Interpretation: 0
Class Interpretation: 0
Lamb/Mutton (Ovis spp) IgE: <0.1 kU/L (ref <0.35)
Pork (Sus spp) IgE: <0.1 kU/L (ref <0.35)

## 2019-05-25 LAB — ANA W/REFLEX: Anti Nuclear Antibody (ANA): NEGATIVE

## 2019-05-25 NOTE — Telephone Encounter (Signed)
Patient is out of town right now, however when she calls back she will just need to come into the [place] office to pick up a jug for her 24 hour urine testing. Patient will need to come on a day that Rachele is in the office as she knows all about this and how to handle it.

## 2019-05-29 NOTE — Telephone Encounter (Signed)
Called patient on 05/28/2019 to inform her of her lab results and to remind her about the 24 hour urine test that Dr. Maudie Mercury is still wanting her to do. Was unable to leave a message due to mailbox being full.

## 2019-05-29 NOTE — Telephone Encounter (Signed)
Patient is scheduled for 10-28 with Dr Loanne Drilling

## 2019-05-29 NOTE — Telephone Encounter (Signed)
Attempted to call patient. No answer. Mailbox full.

## 2019-05-30 ENCOUNTER — Ambulatory Visit: Payer: PRIVATE HEALTH INSURANCE | Admitting: Endocrinology

## 2019-06-05 NOTE — Telephone Encounter (Signed)
Called and left a message for patient to call office in regards to this matter as well as her lab results.

## 2019-06-07 NOTE — Telephone Encounter (Signed)
Spoke with [name] [name] and she stated she was not going to pick up the jug today since she just had a procedure today and will be unable to drive as her husband is currently not available at this time. I advise [name] [name] that we have been trying to get a 24 hr urine specimen to discuss about her mass cell issues. [name] [name] was upset with how things have gone with our office as she is going to different places to get labs done. After speaking with her  I did give our address and stated that she can collect the supply on Friday if she is able but she didn't want to have the 24 urine left in her fridge over the weekend. After speaking with Dr. Maudie Mercury she is able to pick up the material and do the process on Sunday vs having to do it on a Friday and return the 24 urine collection to the office on Monday. Left a voicemail to contact us back regarding this and hopefully get this situated.

## 2019-06-11 NOTE — Telephone Encounter (Signed)
Patient called and came to the [facility] office today and picked up the container and instructions for 24 hour urine.  Dr. Maudie Mercury notified.

## 2019-06-13 ENCOUNTER — Ambulatory Visit: Payer: PRIVATE HEALTH INSURANCE | Admitting: Allergy

## 2019-06-20 ENCOUNTER — Other Ambulatory Visit: Payer: PRIVATE HEALTH INSURANCE | Admitting: *Deleted

## 2019-06-20 ENCOUNTER — Ambulatory Visit: Payer: PRIVATE HEALTH INSURANCE | Admitting: Allergy

## 2019-06-20 ENCOUNTER — Other Ambulatory Visit: Payer: Self-pay

## 2019-06-20 ENCOUNTER — Encounter (INDEPENDENT_AMBULATORY_CARE_PROVIDER_SITE_OTHER): Payer: Self-pay | Admitting: Neurology

## 2019-06-20 DIAGNOSIS — E7801 Familial hypercholesterolemia: Secondary | ICD-10-CM

## 2019-06-20 DIAGNOSIS — I10 Essential (primary) hypertension: Secondary | ICD-10-CM

## 2019-06-20 DIAGNOSIS — E882 Lipomatosis, not elsewhere classified: Secondary | ICD-10-CM

## 2019-06-20 DIAGNOSIS — Z8249 Family history of ischemic heart disease and other diseases of the circulatory system: Secondary | ICD-10-CM

## 2019-06-20 DIAGNOSIS — Z0289 Encounter for other administrative examinations: Secondary | ICD-10-CM

## 2019-06-20 NOTE — Progress Notes (Deleted)
Follow Up Note  RE: [name] [name] MRN: [id number] DOB: 01-Dec-1965 Date of Office Visit: 06/20/2019  Referring provider: Janus Molder, NP Primary care provider: Janus Molder, NP  Chief Complaint: No chief complaint on file.  History of Present Illness: I had the pleasure of seeing [name] [name] for a follow up visit at the [facility] on 06/20/2019. She is a 53 y.o. female, who is being followed for allergic reactions, multiple drug allergies, allergic rhinitis, adverse food reaction, Dercum disease, hymenoptera allergy, coughing. Today she is here for regular follow up visit. Her previous allergy office visit was on 05/17/2019 with Dr. Maudie Mercury.   Please call patient. Reviewed labwork - hymenoptera panel was positive to honeybee, white faced hornet, yellow jacket, wasp, yellow hornet and bumblebee and fire ant. Please make sure she has an EpiPen up-to-date. Regarding the shellfish panel and seafood panel were all negative. The scallop and oyster were borderline positive.  Tryptase level was normal. This is sometimes elevated in an allergic state or mast cell disorders. Hers was normal which is great. I still want her to do the urine test though. Your blood count, urticaria index, and alpha gal (checks for red meat allergy) were all normal which is great.  I still want her to follow the recommendations given at our last office visit. And we will discuss at next visit if we need to make any changes.  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  I was unable to find any publications/journals regarding if Dercum's disease affects allergies.   Based on her clinical history, there are some other concerns and discussed that I would like for her to get the bloodwork and urine test to rule  out mast cell disorder.  I am also concerned if she is having any type of reactions to the supplements she is taking such as the fish oil and tumeric. She states she had some perioral pruritus with shrimp in the past.  ? Okay to take calcium or vitamin D. ? Do not take any tumeric or fish oil or any type of fish products.   Take xyzal 5mg  at night.  Stop Singulair as ineffective.   Monitor symptoms.   For mild symptoms you can take over the counter antihistamines such as Benadryl and monitor symptoms closely. If symptoms worsen or if you have severe symptoms including breathing issues, throat closure, significant swelling, whole body hives, severe diarrhea and vomiting, lightheadedness then inject epinephrine and seek immediate medical care afterwards.  Multiple drug allergies Patient is sensitive to various medications. Some of the reactions are more adverse drug reactions rather then IgE mediated reactions.  Continue to avoid medications which gave her issues in the past. See allergy list.   Other allergic rhinitis Perennial rhino conjunctivitis symptoms for many years with worsening in the fall. Patient was on AIT for 3 years but the last year developed large localized reactions so she stopped the injections. She believes they were helping her symptoms. 2020 immunocap borderline positive to few pollens.   In process of obtaining previous allergy records.  Start azelastine nasal spray 1 spray per nostril twice a day as needed for drainage.  There is history of reaction to steroids so we opted not to use steroid nasal sprays at this time.  Take xyzal 5mg  daily at night.  Given her current clinical history and testing results, I do not believe she is a good candidate for allergy immunotherapy.   Continue environmental control measures to pollen.   Adverse food reaction Currently avoiding gluten and spicy foods as they seem to trigger her Dercum's disease symptoms. Shrimp caused  perioral pruritus in the past. No previous food testing.  Ordered some basic food panel testing via bloodwork including fish and shellfish.  Advised her to avoid seafood/shellfish products for now including the fish oil supplements.  Continue to avoid gluten and spicy foods for now.   For mild symptoms you can take over the counter antihistamines such as Benadryl and monitor symptoms closely. If symptoms worsen or if you have severe symptoms including breathing issues, throat closure, significant swelling, whole body hives, severe diarrhea and vomiting, lightheadedness then inject epinephrine and seek immediate medical care afterwards.  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  Discussed with patient that this is very rare disease and I was not aware of any association with Dercum's disease and allergic issues.  I did recommend that she sees an endocrinologist as she has never seen one previously.  She does follow with dermatology, neurology, cardiology and pulmonology.   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   Will get hymenoptera panel.  Patient has Epipen on hand if needed for anaphylactic reactions.   Coughing Patient follows with pulmonology and had full PFTs in the past which were normal. CT chest was unremarkable except for a 2mm nodule.   Monitor symptoms.   Return in about 4 weeks (around 06/14/2019).  Assessment and Plan: [name] [name] is a 53 y.o. female with: No problem-specific Assessment & Plan notes found for this encounter.  No follow-ups on file.  No orders of the defined types were placed in this encounter.  Lab Orders  No laboratory test(s) ordered today    Diagnostics: Spirometry:  Tracings reviewed. Her effort: {Blank single:19197::"Good reproducible efforts.","It was hard to get consistent efforts and  there is a question as to whether this reflects a maximal maneuver.","Poor effort, data can not be interpreted."} FVC: ***L FEV1: ***L, ***% predicted FEV1/FVC ratio: ***% Interpretation: {Blank single:19197::"Spirometry consistent with mild obstructive disease","Spirometry consistent with moderate obstructive disease","Spirometry consistent with severe obstructive disease","Spirometry consistent with possible restrictive disease","Spirometry consistent with mixed obstructive and restrictive disease","Spirometry uninterpretable due to technique","Spirometry consistent with normal pattern","No overt abnormalities noted given today's efforts"}.  Please see scanned spirometry results for details.  Skin Testing: {Blank single:19197::"Select foods","Environmental allergy panel","Environmental allergy panel and select foods","Food allergy panel","None","Deferred due to recent antihistamines use"}. Positive test to: ***. Negative test to: ***.  Results discussed with patient/family.   Medication List:  Current Outpatient Medications  Medication Sig Dispense Refill  . aspirin EC 81 MG tablet Take 1 tablet (81 mg total) by mouth daily. 90 tablet 3  . Azelastine HCl 0.15 % SOLN Place 1 spray into the nose 2 (two) times daily as needed (drainage). 30 mL 5  . Brimonidine Tartrate (MIRVASO) 0.33 % GEL Mirvaso 0.33 % topical gel  APPLY TO FACE 1-2 TIMES DAILY    . Ciclopirox 1 % shampoo     . EPINEPHrine (EPIPEN 2-PAK) 0.3 mg/0.3 mL IJ SOAJ injection Inject 0.3 mLs (0.3 mg total) into the muscle as needed. 2 Device 1  . estradiol (ESTRACE) 1 MG tablet     . ketoconazole (NIZORAL) 2 % shampoo     .  levocetirizine (XYZAL) 5 MG tablet Take 5 mg by mouth every evening.    . Multiple Vitamins-Calcium (ONE-A-DAY WOMENS PO) Take 1 tablet by mouth daily.    Marland Kitchen olmesartan (BENICAR) 20 MG tablet Take 20 mg by mouth daily.    . ondansetron (ZOFRAN ODT) 4 MG disintegrating tablet Take 1 tablet (4 mg total) by mouth  every 8 (eight) hours as needed for nausea or vomiting. 6 tablet 0  . RABEprazole (ACIPHEX) 20 MG tablet Take 20 mg by mouth 2 (two) times daily.    . rizatriptan (MAXALT-MLT) 10 MG disintegrating tablet Take 10 mg by mouth every 2 (two) hours as needed for migraine.   11  . rosuvastatin (CRESTOR) 10 MG tablet Take 1 tablet (10 mg total) by mouth daily. (Patient not taking: Reported on 05/17/2019) 90 tablet 3  . Vitamin D, Ergocalciferol, (DRISDOL) 50000 units CAPS capsule Take 50,000 Units by mouth every Wednesday.      No current facility-administered medications for this visit.    Allergies: Allergies  Allergen Reactions  . Contrast Media [Iodinated Diagnostic Agents] Anaphylaxis  . Morphine And Related Anaphylaxis and Nausea And Vomiting  . Prednisone Anaphylaxis  . Yellow Jacket Venom [Bee Venom] Anaphylaxis  . Azithromycin Other (See Comments)    SEVERE STOMACH PAIN   . Erythromycin Nausea And Vomiting and Other (See Comments)    SEVERE STOMACH PAIN/abdominal pain   . Iohexol Hives, Itching and Swelling    Swelling of upper lip, thick tongue, hives on face and back and itching all over  . Oxycontin [Oxycodone] Nausea And Vomiting  . Avelox [Moxifloxacin Hcl In Nacl] Swelling  . Codeine Nausea And Vomiting and Other (See Comments)    Headaches also  . Gadolinium Derivatives Hives, Itching and Swelling  . Levaquin [Levofloxacin In D5w] Swelling  . Oxycodone-Acetaminophen Nausea And Vomiting  . Pantoprazole Other (See Comments)    "Feels like I have the flu"  . Augmentin [Amoxicillin-Pot Clavulanate] Nausea And Vomiting  . Dilaudid [Hydromorphone Hcl] Nausea And Vomiting  . Oxycodone Hcl Nausea And Vomiting  . Tramadol Nausea Only   I reviewed her past medical history, social history, family history, and environmental history and no significant changes have been reported from her previous visit.  Review of Systems  Constitutional: Positive for unexpected weight change  (weight gain). Negative for appetite change, chills and fever.  HENT: Positive for congestion and postnasal drip. Negative for rhinorrhea.   Eyes: Negative for itching.  Respiratory: Positive for chest tightness and shortness of breath. Negative for cough and wheezing.   Cardiovascular: Negative for chest pain.  Gastrointestinal: Negative for abdominal pain.  Genitourinary: Negative for difficulty urinating.  Skin: Negative for rash.  Allergic/Immunologic: Positive for environmental allergies.  Neurological: Positive for headaches.   Objective: There were no vitals taken for this visit. There is no height or weight on file to calculate BMI. Physical Exam  Constitutional: She is oriented to person, place, and time. She appears well-developed and well-nourished.  HENT:  Head: Normocephalic and atraumatic.  Right Ear: External ear normal.  Left Ear: External ear normal.  Nose: Nose normal.  Mouth/Throat: Oropharynx is clear and moist.  Eyes: Conjunctivae and EOM are normal.  Neck: Neck supple.  Cardiovascular: Normal rate, regular rhythm and normal heart sounds. Exam reveals no gallop and no friction rub.  No murmur heard. Pulmonary/Chest: Effort normal and breath sounds normal. She has no wheezes. She has no rales.  Abdominal: Soft.  Neurological: She is alert and oriented  to person, place, and time.  Skin: Skin is warm. No rash noted.  Psychiatric: She has a normal mood and affect. Her behavior is normal.  Nursing note and vitals reviewed.  Previous notes and tests were reviewed. The plan was reviewed with the patient/family, and all questions/concerned were addressed.  It was my pleasure to see [name] [name] today and participate in her care. Please feel free to contact me with any questions or concerns.  Sincerely,  [name], DO Allergy & Immunology  [facility] office: [phone] [facility] office: [place] office:  [phone]

## 2019-06-20 NOTE — Telephone Encounter (Signed)
As to your friend- we have Dr [name] here who is a movement disorder specialist.  At [facility], it is Dr [name] and Dr [name] who mostly deal with parkinson's/ however they treat essential tremor too .   As to follow up for [name]- I learnt only through you about this disorder and remembered darkly that a nurse at [facility] had this diagnosis, too. She had used lymphatic drainage massages and compression hose for treatment.  Would you like to be referred for the massages?  [name] [name].  ===View-only below this line===

## 2019-06-21 ENCOUNTER — Other Ambulatory Visit: Payer: Self-pay | Admitting: Neurology

## 2019-06-21 DIAGNOSIS — E882 Lipomatosis, not elsewhere classified: Secondary | ICD-10-CM

## 2019-06-21 DIAGNOSIS — R42 Dizziness and giddiness: Secondary | ICD-10-CM

## 2019-06-21 DIAGNOSIS — M791 Myalgia, unspecified site: Secondary | ICD-10-CM

## 2019-06-21 DIAGNOSIS — H9201 Otalgia, right ear: Secondary | ICD-10-CM

## 2019-06-21 DIAGNOSIS — R609 Edema, unspecified: Secondary | ICD-10-CM

## 2019-06-21 DIAGNOSIS — H9319 Tinnitus, unspecified ear: Secondary | ICD-10-CM

## 2019-06-21 LAB — LIPID PANEL
Chol/HDL Ratio: 2.1 ratio (ref 0.0–4.4)
Cholesterol, Total: 143 mg/dL (ref 100–199)
HDL: 69 mg/dL (ref >39)
LDL Chol Calc (NIH): 52 mg/dL (ref 0–99)
Triglycerides: 130 mg/dL (ref 0–149)
VLDL Cholesterol Cal: 22 mg/dL (ref 5–40)

## 2019-06-21 LAB — HIGH SENSITIVITY CRP: CRP, High Sensitivity: 2.49 mg/L (ref 0.00–3.00)

## 2019-06-21 LAB — HEPATIC FUNCTION PANEL
ALT: 12 IU/L (ref 0–32)
AST: 12 IU/L (ref 0–40)
Albumin: 4.3 g/dL (ref 3.8–4.9)
Alkaline Phosphatase: 74 IU/L (ref 39–117)
Bilirubin Total: 0.3 mg/dL (ref 0.0–1.2)
Bilirubin, Direct: 0.11 mg/dL (ref 0.00–0.40)
Total Protein: 6.6 g/dL (ref 6.0–8.5)

## 2019-06-21 LAB — LIPOPROTEIN A (LPA): Lipoprotein (a): 207.5 nmol/L — ABNORMAL HIGH (ref <75.0)

## 2019-06-21 NOTE — Telephone Encounter (Signed)
I really like for you to stay with Velda's team at [facility] if possible. I think lymphedema massage is mostly used by oncologist , so we look where they send their patients.  Compression garments are made to measure ? No sure how this works- never had this ordered. Can PCP help?

## 2019-06-25 ENCOUNTER — Telehealth: Payer: Self-pay | Admitting: Interventional Cardiology

## 2019-06-25 NOTE — Telephone Encounter (Signed)
Don't have smart phrases yet.  rosuvastatin (CRESTOR) 10 MG   Symptoms: Foot cramps, right calf cramp, pain in back (between shoulder blades), pressure, tightness and pain in right side of chest, pain upper right abdomen (started dull now very sharp). Mostly on the right side. Runny nose, anaphylaxis (sore throat, irritated inside of mouth), indigestion.

## 2019-06-25 NOTE — Telephone Encounter (Signed)
Will route to Dr. [name].  Pt sent MyChart message previously concerning having sx with Crestor.

## 2019-06-26 NOTE — Telephone Encounter (Signed)
Stop Crestor. Observe to see if anything improves.

## 2019-06-26 NOTE — Telephone Encounter (Addendum)
Contacted pt again.  Pt having back pain, pain in side like someone is stabbing her ("in area where my gallbladder use to be"), belly pain and swelling in feet.  Also feels like she's having severe heartburn.  Recently had endoscopy and colonoscopy with Dr. Cristina Gong.  He mentioned to her that she does have bile backing up.  About a month ago is when she noticed that her lips would swell slightly, mouth felt funny and would have a sore throat.  This has not worsened or improved.  Pt thought it might be her toothpaste so she changed it.  Then thought it might be related to the endoscopy but still unrelieved.  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 Pain in side and belly pain started after her endoscopy and colonoscopy.  States this pain feels like she's "about to give birth".  Pt googled Crestor and feels like it is the culprit to a lot of her sx.  States she is very concerned the pain in her side is coming from the medication causing liver issues.  Advised pt recent labs show normal liver function.  Pt also mentioned swelling in her feet.  Uses compression stockings that help.  Recently dx with lipedema.  Pt had an episode of chest pressure yesterday.  Has chest pressure that comes and goes since being dx with Dercums but this was a little worse.  Due to muscle issues associated with Dercums, pt unsure of what to watch for in regards to heart.  Advised I will send message to Dr. [name] for review.

## 2019-06-26 NOTE — Telephone Encounter (Signed)
Spoke with pt briefly and then she had to get off the phone because she was in line for something and had to pay for her items.  She asked that I call back.  She did mention that she has cramping in her feet and calves, chest discomfort after taking meds, runny nose, sore throat and irritation in mouth after taking Crestor and pains in right upper abd.  She thought the sore throat and mouth irritation was related to her new toothpaste at first but doesn't think it is that at this point.  This is all the info I got before pt had to get off of the phone.

## 2019-06-26 NOTE — Telephone Encounter (Signed)
Attempted to contact pt. VM full.

## 2019-07-02 ENCOUNTER — Other Ambulatory Visit: Payer: Self-pay

## 2019-07-02 DIAGNOSIS — Z20822 Contact with and (suspected) exposure to covid-19: Secondary | ICD-10-CM

## 2019-07-02 NOTE — Telephone Encounter (Signed)
Attempted to contact pt.  VM still full.  Unable to leave message.

## 2019-07-03 LAB — OTHER LAB TEST

## 2019-07-03 LAB — NOVEL CORONAVIRUS, NAA: SARS-CoV-2, NAA: NOT DETECTED

## 2019-07-04 ENCOUNTER — Other Ambulatory Visit: Payer: Self-pay

## 2019-07-04 ENCOUNTER — Encounter: Payer: Self-pay | Admitting: Allergy

## 2019-07-04 ENCOUNTER — Ambulatory Visit (INDEPENDENT_AMBULATORY_CARE_PROVIDER_SITE_OTHER): Payer: PRIVATE HEALTH INSURANCE | Admitting: Allergy

## 2019-07-04 DIAGNOSIS — E882 Lipomatosis, not elsewhere classified: Secondary | ICD-10-CM

## 2019-07-04 DIAGNOSIS — Z91030 Bee allergy status: Secondary | ICD-10-CM

## 2019-07-04 DIAGNOSIS — T7840XD Allergy, unspecified, subsequent encounter: Secondary | ICD-10-CM | POA: Diagnosis not present

## 2019-07-04 DIAGNOSIS — R059 Cough, unspecified: Secondary | ICD-10-CM

## 2019-07-04 DIAGNOSIS — R05 Cough: Secondary | ICD-10-CM

## 2019-07-04 DIAGNOSIS — Z91038 Other insect allergy status: Secondary | ICD-10-CM

## 2019-07-04 DIAGNOSIS — Z889 Allergy status to unspecified drugs, medicaments and biological substances status: Secondary | ICD-10-CM | POA: Diagnosis not present

## 2019-07-04 DIAGNOSIS — J3089 Other allergic rhinitis: Secondary | ICD-10-CM

## 2019-07-04 DIAGNOSIS — T781XXD Other adverse food reactions, not elsewhere classified, subsequent encounter: Secondary | ICD-10-CM

## 2019-07-04 MED ORDER — IPRATROPIUM BROMIDE 0.03 % NA SOLN: 2.0000 | Freq: Three times a day (TID) | NASAL | 5 refills | Status: DC | PRN

## 2019-07-04 NOTE — Patient Instructions (Addendum)
Allergic reaction  Keep track of reactions.   Other allergic rhinitis 2020 immunocap borderline positive to few pollens.   Try Atrovent nasal spray - 2 sprays per nostril up to three times a day as needed for drainage. This replaces azelastine for now.   Recommend ENT evaluation for your sinuses.  If work up is normal then may want to try a low dose steroid nasal spray for your nasal pressure/congestion symptoms.   Continue xyzal 5mg  daily at night.   Continue environmental control measures to pollen.   Adverse food reaction  Avoid mollusks - oysters, scallops.   For mild symptoms you can take over the counter antihistamines such as Benadryl and monitor symptoms closely. If symptoms worsen or if you have severe symptoms including breathing issues, throat closure, significant swelling, whole body hives, severe diarrhea and vomiting, lightheadedness then inject epinephrine and seek immediate medical care afterwards.  Dercum disease  Continue follow up with dermatology, neurology, cardiology and pulmonology.   If no endocrinology evaluation then may be worthwhile seeing them as well.   Hymenoptera allergy  Avoid insect/ant stings.   Read about the venom immunotherapy and let us know if ready to start.  For mild symptoms you can take over the counter antihistamines such as Benadryl and monitor symptoms closely. If symptoms worsen or if you have severe symptoms including breathing issues, throat closure, significant swelling, whole body hives, severe diarrhea and vomiting, lightheadedness then inject epinephrine and seek immediate medical care afterwards.  Coughing  Monitor symptoms and see if it improves with the Atrovent nasal spray.   Multiple drug allergies  Continue to avoid medications that are on your allergy list.   Follow up in 3 months or sooner if needed. Sincerely,  [name] Alberts, [name] [name] Allergy & Immunology  [facility] office: [phone] [facility] office: [place] office: [phone]  Reducing Pollen Exposure . Pollen seasons: trees (spring), grass (summer) and ragweed/weeds (fall). Marland Kitchen Keep windows closed in your home and car to lower pollen exposure.  Susa Simmonds air conditioning in the bedroom and throughout the house if possible.  . Avoid going out in dry windy days - especially early morning. . Pollen counts are highest between 5 - 10 AM and on dry, hot and windy days.  . Save outside activities for late afternoon or after a heavy rain, when pollen levels are lower.  . Avoid mowing of grass if you have grass pollen allergy. Marland Kitchen Be aware that pollen can also be transported indoors on people and pets.  . Dry your clothes in an automatic dryer rather than hanging them outside where they might collect pollen.  . Rinse hair and eyes before bedtime.

## 2019-07-04 NOTE — Progress Notes (Signed)
RE: [name] [name] MRN: [id number] DOB: 1966/01/26 Date of Telemedicine Visit: 07/04/2019  Referring provider: Janus Molder, NP Primary care provider: Janus Molder, NP  Chief Complaint: Follow-up and running nose (has stop crestor which caused her problem)   Telemedicine Follow Up Visit via Telephone: I connected with [name] [name] for a follow up on 07/05/19 by telephone and verified that I am speaking with the correct person using two identifiers.   I discussed the limitations, risks, security and privacy concerns of performing an evaluation and management service by telephone and the availability of in person appointments. I also discussed with the patient that there may be a patient responsible charge related to this service. The patient expressed understanding and agreed to proceed.  Patient is at home/work. Provider is at the office.  Visit start time: 3:49PM Visit end time: 4:24PM Insurance consent/check in by: front desk Medical consent and medical assistant/nurse: Sheliah Plane.  History of Present Illness: She is a 53 y.o. female, who is being followed for allergic reaction. Her previous allergy office visit was on 05/17/2019 with Dr. Maudie Mercury. Today is a regular follow up visit.  Patient was started on Crestor around the same time she came and saw me initially but now off for 1.5 weeks due to muscle pains and other side effects. She feels better off it.   Allergic reaction Patient had shrimp and scallop and had some mild perioral pruritus. But sometimes she can eat them with no issues. She did stop her fish supplements.   Taking xyzal 5mg  at night.   Other allergic rhinitis Still having PND and coughing.  Tried azelastine nasal spray with unknown benefit.  Patient takes Aciphex.  No recent ENT evaluation.   Dercum disease Follows with dermatology, neurology, cardiology and pulmonology.   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  Recently stung by 3  ants and had whole body hives a few months ago.  Stung by wasp recently and couldn't breathe within 15 minutes.  Coughing  Coughing, sore throat, chest pain, headaches lately and thinks may have been exposed to COVID-19. She did have a negative swab but husband works in [employer].   Assessment and Plan: [name] [name] is a 53 y.o. 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 Interim history - No additional major reactions. Stopped fish supplements and her Crestor which has been helping.  Reviewed labwork - hymenoptera panel was positive to honeybee, white faced hornet, yellow jacket, wasp, yellow hornet and bumblebee and fire ant. Shellfish panel and seafood panel were all negative. The scallop and oyster were borderline positive. Tryptase, blood count, urticaria index, and alpha gal were all normal. 2,3 Dinor-11Beta-Prostaglandin F2 Alpha, Urine - 2157pg/mg Cr (ref <5205pg/mg Cr) - normal. Still awaiting N-Methylhistamine, 24-Hour Urine results.   Continue xyzal 5mg  at night.  Monitor symptoms.   For mild symptoms you can take over the counter antihistamines such as Benadryl and monitor symptoms closely. If symptoms worsen or if you have severe symptoms including breathing issues, throat closure, significant swelling, whole body hives, severe diarrhea and vomiting, lightheadedness then inject epinephrine and seek immediate medical care afterwards.  Hymenoptera allergy Past history - Concern regarding hymenoptera allergy as she developed some respiratory symptoms after getting stung by a yellow jacket.  Interim history - hymenoptera panel was positive to honeybee, white faced hornet, yellow jacket,  wasp, yellow hornet and bumblebee and fire ant.  Continue avoidance.  Discussed risks and benefits of  venom immunotherapy and mailed information to patient's home. She will read and discuss with her husband.   Patient has Epipen on hand if needed for anaphylactic reactions.   Multiple drug allergies Past history - Patient is sensitive to various medications. Some of the reactions are more adverse drug reactions rather then IgE mediated reactions.  Continue to avoid medications which gave her issues in the past. See allergy list.   Other allergic rhinitis Past history - Perennial rhino conjunctivitis symptoms for many years with worsening in the fall. Patient was on AIT for 3 years but the last year developed large localized reactions so she stopped the injections. She believes they were helping her symptoms. 2020 immunocap borderline positive to few pollens.  Interim history - previous allergist didn't send records. Azelastine not sure if effective.   Try Atrovent nasal spray - 2 sprays per nostril up to three times a day as needed for drainage. This replaces azelastine for now.   Recommend ENT evaluation.   If work up is normal then may want to try a low dose steroid nasal spray for nasal pressure/congestion symptoms - will discuss at home vs in office introduction.   Continue xyzal 5mg  daily at night.   Continue environmental control measures to pollen.   Dercum disease Past history - Patient was diagnosed with Dercum's disease by dermatology at [facility].  Continue follow up with dermatology, neurology, cardiology and pulmonology.   If no endocrinology evaluation then may be worthwhile seeing them as well.   Adverse food reaction Past history - Currently avoiding gluten and spicy foods as they seem to trigger her Dercum's disease symptoms. Shrimp caused perioral pruritus in the past. Interim history - Shellfish panel and seafood panel were all negative. The scallop and oyster were borderline positive. Tried shrimp and scallops with mild perioral pruritus. Sometimes this happens  and sometimes it does not.   Avoid mollusks - oysters, scallops.   For mild symptoms you can take over the counter antihistamines such as Benadryl and monitor symptoms closely. If symptoms worsen or if you have severe symptoms including breathing issues, throat closure, significant swelling, whole body hives, severe diarrhea and vomiting, lightheadedness then inject epinephrine and seek immediate medical care afterwards.  Coughing Past history - Patient follows with pulmonology and had full PFTs in the past which were normal. CT chest was unremarkable except for a 31mm nodule.  Interim history - having some URI symptoms, COVID-19 test was negative.  Monitor symptoms.   Return in about 3 months (around 10/02/2019).  Meds ordered this encounter  Medications  . ipratropium (ATROVENT) 0.03 % nasal spray    Sig: Place 2 sprays into both nostrils 3 (three) times daily as needed for rhinitis (nasal drainage).    Dispense:  30 mL    Refill:  5   Diagnostics: None.  Medication List:  Current Outpatient Medications  Medication Sig Dispense Refill  . aspirin EC 81 MG tablet Take 1 tablet (81 mg total) by mouth daily. 90 tablet 3  . Brimonidine Tartrate (MIRVASO) 0.33 % GEL Mirvaso 0.33 % topical gel  APPLY TO FACE 1-2 TIMES DAILY    . Ciclopirox 1 % shampoo     . EPINEPHrine (EPIPEN 2-PAK) 0.3 mg/0.3 mL IJ SOAJ injection Inject 0.3 mLs (0.3 mg total) into the muscle as needed. 2 Device 1  . estradiol (ESTRACE) 1 MG tablet     .  ketoconazole (NIZORAL) 2 % shampoo     . levocetirizine (XYZAL) 5 MG tablet Take 5 mg by mouth every evening.    . Multiple Vitamins-Calcium (ONE-A-DAY WOMENS PO) Take 1 tablet by mouth daily.    Marland Kitchen olmesartan (BENICAR) 20 MG tablet Take 20 mg by mouth daily.    . ondansetron (ZOFRAN ODT) 4 MG disintegrating tablet Take 1 tablet (4 mg total) by mouth every 8 (eight) hours as needed for nausea or vomiting. 6 tablet 0  . RABEprazole (ACIPHEX) 20 MG tablet Take 20 mg by  mouth 2 (two) times daily.    . rizatriptan (MAXALT-MLT) 10 MG disintegrating tablet Take 10 mg by mouth every 2 (two) hours as needed for migraine.   11  . Vitamin D, Ergocalciferol, (DRISDOL) 50000 units CAPS capsule Take 50,000 Units by mouth every Wednesday.     Marland Kitchen ipratropium (ATROVENT) 0.03 % nasal spray Place 2 sprays into both nostrils 3 (three) times daily as needed for rhinitis (nasal drainage). 30 mL 5  . rosuvastatin (CRESTOR) 10 MG tablet Take 1 tablet (10 mg total) by mouth daily. (Patient not taking: Reported on 05/17/2019) 90 tablet 3   No current facility-administered medications for this visit.    Allergies: Allergies  Allergen Reactions  . Contrast Media [Iodinated Diagnostic Agents] Anaphylaxis  . Morphine And Related Anaphylaxis and Nausea And Vomiting  . Prednisone Anaphylaxis  . Yellow Jacket Venom [Bee Venom] Anaphylaxis  . Azithromycin Other (See Comments)    SEVERE STOMACH PAIN   . Erythromycin Nausea And Vomiting and Other (See Comments)    SEVERE STOMACH PAIN/abdominal pain   . Iohexol Hives, Itching and Swelling    Swelling of upper lip, thick tongue, hives on face and back and itching all over  . Oxycontin [Oxycodone] Nausea And Vomiting  . Avelox [Moxifloxacin Hcl In Nacl] Swelling  . Codeine Nausea And Vomiting and Other (See Comments)    Headaches also  . Gadolinium Derivatives Hives, Itching and Swelling  . Levaquin [Levofloxacin In D5w] Swelling  . Oxycodone-Acetaminophen Nausea And Vomiting  . Pantoprazole Other (See Comments)    "Feels like I have the flu"  . Augmentin [Amoxicillin-Pot Clavulanate] Nausea And Vomiting  . Dilaudid [Hydromorphone Hcl] Nausea And Vomiting  . Oxycodone Hcl Nausea And Vomiting  . Tramadol Nausea Only   I reviewed her past medical history, social history, family history, and environmental history and no significant changes have been reported from her previous visit.  Review of Systems  Constitutional: Positive for  unexpected weight change (weight gain). Negative for appetite change, chills and fever.  HENT: Positive for congestion, postnasal drip and sinus pressure. Negative for rhinorrhea.   Eyes: Negative for itching.  Respiratory: Positive for cough. Negative for chest tightness, shortness of breath and wheezing.   Cardiovascular: Negative for chest pain.  Gastrointestinal: Negative for abdominal pain.  Genitourinary: Negative for difficulty urinating.  Skin: Negative for rash.  Allergic/Immunologic: Positive for environmental allergies.  Neurological: Positive for headaches.   Objective: Physical Exam Not obtained as encounter was done via telephone.   Previous notes and tests were reviewed.  I discussed the assessment and treatment plan with the patient. The patient was provided an opportunity to ask questions and all were answered. The patient agreed with the plan and demonstrated an understanding of the instructions. After visit summary/patient instructions available via mail.   The patient was advised to call back or seek an in-person evaluation if the symptoms worsen or if the condition fails to  improve as anticipated.  I provided 35 minutes of non-face-to-face time during this encounter.  It was my pleasure to participate in [name] [name]'s care today. Please feel free to contact me with any questions or concerns.   Sincerely,  [name], DO Allergy & Immunology  [facility] office: [phone] [facility] office: [place] office: [phone]

## 2019-07-05 ENCOUNTER — Encounter: Payer: Self-pay | Admitting: Allergy

## 2019-07-05 NOTE — Assessment & Plan Note (Addendum)
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 Interim history - No additional major reactions. Stopped fish supplements and her Crestor which has been helping.  Reviewed labwork - hymenoptera panel was positive to honeybee, white faced hornet, yellow jacket, wasp, yellow hornet and bumblebee and fire ant. Shellfish panel and seafood panel were all negative. The scallop and oyster were borderline positive. Tryptase, blood count, urticaria index, and alpha gal were all normal. 2,3 Dinor-11Beta-Prostaglandin F2 Alpha, Urine - 2157pg/mg Cr (ref <5205pg/mg Cr) - normal. Still awaiting N-Methylhistamine, 24-Hour Urine results.   Continue xyzal 5mg  at night.  Monitor symptoms.   For mild symptoms you can take over the counter antihistamines such as Benadryl and monitor symptoms closely. If symptoms worsen or if you have severe symptoms including breathing issues, throat closure, significant swelling, whole body hives, severe diarrhea and vomiting, lightheadedness then inject epinephrine and seek immediate medical care afterwards.

## 2019-07-05 NOTE — Progress Notes (Signed)
AVS and venom information sent today via mail.

## 2019-07-05 NOTE — Assessment & Plan Note (Signed)
Past history - Patient is sensitive to various medications. Some of the reactions are more adverse drug reactions rather then IgE mediated reactions.  Continue to avoid medications which gave her issues in the past. See allergy list.  

## 2019-07-05 NOTE — Assessment & Plan Note (Signed)
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 Patient was on AIT for 3 years but the last year developed large localized reactions so she stopped the injections. She believes they were helping her symptoms. 2020 immunocap borderline positive to few pollens.  Interim history - previous allergist didn't send records. Azelastine not sure if effective.   Try Atrovent nasal spray - 2 sprays per nostril up to three times a day as needed for drainage. This replaces azelastine for now.   Recommend ENT evaluation.   If work up is normal then may want to try a low dose steroid nasal spray for nasal pressure/congestion symptoms - will discuss at home vs in office introduction.   Continue xyzal 5mg  daily at night.   Continue environmental control measures to pollen.

## 2019-07-05 NOTE — Assessment & Plan Note (Signed)
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  Continue follow up with dermatology, neurology, cardiology and pulmonology.   If no endocrinology evaluation then may be worthwhile seeing them as well.

## 2019-07-05 NOTE — Assessment & Plan Note (Addendum)
Past history - Concern regarding hymenoptera allergy as she developed some respiratory symptoms after getting stung by a yellow jacket.  Interim history - hymenoptera panel was positive to honeybee, white faced hornet, yellow jacket, wasp, yellow hornet and bumblebee and fire ant.  Continue avoidance.  Discussed risks and benefits of venom immunotherapy and mailed information to patient's home. She will read and discuss with her husband.   Patient has Epipen on hand if needed for anaphylactic reactions.

## 2019-07-05 NOTE — Assessment & Plan Note (Signed)
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 Shrimp caused perioral pruritus in the past. Interim history - Shellfish panel and seafood panel were all negative. The scallop and oyster were borderline positive. Tried shrimp and scallops with mild perioral pruritus. Sometimes this happens and sometimes it does not.   Avoid mollusks - oysters, scallops.   For mild symptoms you can take over the counter antihistamines such as Benadryl and monitor symptoms closely. If symptoms worsen or if you have severe symptoms including breathing issues, throat closure, significant swelling, whole body hives, severe diarrhea and vomiting, lightheadedness then inject epinephrine and seek immediate medical care afterwards.

## 2019-07-05 NOTE — Assessment & Plan Note (Signed)
Past history - Patient follows with pulmonology and had full PFTs in the past which were normal. CT chest was unremarkable except for a 56mm nodule.  Interim history - having some URI symptoms, COVID-19 test was negative.  Monitor symptoms.

## 2019-07-06 ENCOUNTER — Other Ambulatory Visit: Payer: Self-pay

## 2019-07-06 DIAGNOSIS — Z20822 Contact with and (suspected) exposure to covid-19: Secondary | ICD-10-CM

## 2019-07-09 ENCOUNTER — Telehealth: Payer: Self-pay

## 2019-07-09 LAB — NOVEL CORONAVIRUS, NAA: SARS-CoV-2, NAA: NOT DETECTED

## 2019-07-09 NOTE — Telephone Encounter (Signed)
Sent patient myhcart message about N-Methylhistamine, 24-Hour Urine test - lab was not able to add on the test.

## 2019-07-10 ENCOUNTER — Encounter: Payer: Self-pay | Admitting: *Deleted

## 2019-07-10 NOTE — Telephone Encounter (Signed)
MyChart message sent since VM is full and unable to leave message.

## 2019-08-14 ENCOUNTER — Encounter: Payer: Self-pay | Admitting: Allergy

## 2019-08-28 ENCOUNTER — Telehealth: Payer: Self-pay | Admitting: Neurology

## 2019-08-28 NOTE — Telephone Encounter (Signed)
Received a notification from [facility] advising that the patient states that something is going on and possibly wrong with her current machine. She was advised that she would have to bring the machine into the [employer] office and have them assess and look at the machine to see what is wrong with the machine as well as whether they would have to send off for repairs or if covered under warranty. Patient declined wanting to bring into office due to covid. They offered a alternative where the patient didn't have to come into the office but could bring her machine there and leave outside of their office and call them and they will come out to grab it and assess the machine while she waits in her car. Patient states that she is afraid that someone "will blow in the machine" they advised that is not how the machine is assessed. Patient states she just wanted to get a new machine. It was educated that she is not eligible for a new machine and she informed that she would transfer her care to a different DME company in which she was advised they would also not be able to set her up with a new machine. As of this moment their conversation ended and there is nothing needed from [place]. [employer] just wanted to update [place] on where things stand in case the patient called in. Advised I would document for our records.

## 2019-08-29 ENCOUNTER — Ambulatory Visit: Payer: PRIVATE HEALTH INSURANCE | Admitting: Podiatry

## 2019-08-29 ENCOUNTER — Other Ambulatory Visit: Payer: Self-pay

## 2019-08-29 VITALS — Temp 97.0°F

## 2019-08-29 DIAGNOSIS — B351 Tinea unguium: Secondary | ICD-10-CM

## 2019-08-29 DIAGNOSIS — L600 Ingrowing nail: Secondary | ICD-10-CM | POA: Diagnosis not present

## 2019-08-29 MED ORDER — TERBINAFINE HCL 250 MG PO TABS: 250.0000 mg | ORAL_TABLET | Freq: Every day | ORAL | 0 refills | Status: DC

## 2019-08-29 NOTE — Progress Notes (Signed)
Subjective:   Patient ID: [name] [name], female   DOB: 54 y.o.   MRN: [id number]   HPI Patient states she has trouble with her nails of both feet and thinks maybe they are ingrown also has a issue with excessive lipoma formation which does cause her to develop what appears to be some tingling burning type pain and just generalized systemic inflammation irritation.  Patient does not smoke likes to be active   Review of Systems  All other systems reviewed and are negative.       Objective:  Physical Exam Vitals and nursing note reviewed.  Constitutional:      Appearance: She is well-developed.  Pulmonary:     Effort: Pulmonary effort is normal.  Musculoskeletal:        General: Normal range of motion.  Skin:    General: Skin is warm.  Neurological:     Mental Status: She is alert.     Neurovascular status was found to be adequate muscle strength was found to be within normal limits with patient noted to have incurvated nail bed hallux bilateral left worse than right with some lifting in the middle and discoloration which appears to be more bruising.  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  Patient understands this and wants to try working to fix the left hallux nail and I reviewed the consent form with her.  I do not think she will have problems but there is no guarantees and she understands this signed  consent form and today I went ahead infiltrated 60 mg Xylocaine Marcaine and patient seemed to develop some allergic reaction and does have EpiPen I offered her the EpiPen but she denies wanting to use it.  It did seem to settle down and I went ahead remove the medial lateral borders left hallux and applied a small amount of phenol applied sterile dressing and gave instructions for soaks and if any issues were to occur to let us know immediately and if everything goes well we will consider correction of the right nail.  I also placed her on 45 days of antifungal to try to help the redness and itching she is getting her feet and if it causes her any adverse reaction she will stop taking it immediately understanding risk

## 2019-08-29 NOTE — Patient Instructions (Signed)

## 2019-08-31 ENCOUNTER — Encounter: Payer: Self-pay | Admitting: Podiatry

## 2019-08-31 NOTE — Telephone Encounter (Signed)
Unable to leave a message the mailbox is full. °

## 2019-09-03 ENCOUNTER — Other Ambulatory Visit: Payer: Self-pay | Admitting: Neurology

## 2019-09-03 ENCOUNTER — Encounter: Payer: Self-pay | Admitting: Neurology

## 2019-09-03 ENCOUNTER — Encounter: Payer: Self-pay | Admitting: Endocrinology

## 2019-09-03 DIAGNOSIS — M791 Myalgia, unspecified site: Secondary | ICD-10-CM

## 2019-09-03 DIAGNOSIS — G4733 Obstructive sleep apnea (adult) (pediatric): Secondary | ICD-10-CM

## 2019-09-03 DIAGNOSIS — F419 Anxiety disorder, unspecified: Secondary | ICD-10-CM

## 2019-09-03 DIAGNOSIS — I159 Secondary hypertension, unspecified: Secondary | ICD-10-CM

## 2019-09-03 DIAGNOSIS — R609 Edema, unspecified: Secondary | ICD-10-CM

## 2019-09-03 HISTORY — PX: FOOT SURGERY: SHX648

## 2019-09-03 NOTE — Telephone Encounter (Signed)
Please tell Mrs. Ingles that I will increase the CPAP pressure window from 5 through 13 cm water.

## 2019-09-19 ENCOUNTER — Ambulatory Visit: Payer: PRIVATE HEALTH INSURANCE | Admitting: Podiatry

## 2019-09-19 ENCOUNTER — Other Ambulatory Visit: Payer: Self-pay

## 2019-09-19 DIAGNOSIS — L03032 Cellulitis of left toe: Secondary | ICD-10-CM | POA: Diagnosis not present

## 2019-09-19 DIAGNOSIS — E559 Vitamin D deficiency, unspecified: Secondary | ICD-10-CM | POA: Insufficient documentation

## 2019-09-19 DIAGNOSIS — R8290 Unspecified abnormal findings in urine: Secondary | ICD-10-CM | POA: Insufficient documentation

## 2019-09-19 DIAGNOSIS — R300 Dysuria: Secondary | ICD-10-CM | POA: Insufficient documentation

## 2019-09-19 DIAGNOSIS — B373 Candidiasis of vulva and vagina: Secondary | ICD-10-CM | POA: Insufficient documentation

## 2019-09-19 DIAGNOSIS — B3731 Acute candidiasis of vulva and vagina: Secondary | ICD-10-CM | POA: Insufficient documentation

## 2019-09-19 DIAGNOSIS — B9689 Other specified bacterial agents as the cause of diseases classified elsewhere: Secondary | ICD-10-CM | POA: Insufficient documentation

## 2019-09-19 MED ORDER — DOXYCYCLINE HYCLATE 100 MG PO TABS: 100.0000 mg | ORAL_TABLET | Freq: Two times a day (BID) | ORAL | 0 refills | Status: DC

## 2019-09-19 MED ORDER — FLUCONAZOLE 150 MG PO TABS: 150.0000 mg | ORAL_TABLET | Freq: Once | ORAL | 0 refills | Status: AC

## 2019-09-19 NOTE — Progress Notes (Signed)
Subjective:   Patient ID: [name] [name], female   DOB: 54 y.o.   MRN: [id number]   HPI Patient presents stating she was concerned about some irritation of the left hallux and wanted to make sure that it was okay and is thinking she needs to be on antibiotic and also concerned about dry skin in the bottom of her heel in between her toes.  States she had trouble taking the antifungal   ROS      Objective:  Physical Exam  Neurovascular status intact with redness noted of the medial and lateral borders of the left hallux that is localized with no proximal edema erythema drainage noted and irritation between the toes and on the heel left over right     Assessment:  Light low-grade paronychia infection localized left with probability for either mild fungal or dry skin type infection of the skin     Plan:  H&P reviewed both conditions and we will start her on doxycycline for the low-grade redness and also I am starting her on Diflucan 1 pill/week for 6 weeks to try to help with any of the possible fungal infection.  I then went ahead and we will start her utilizing Vaseline and will be seen back to recheck

## 2019-10-03 ENCOUNTER — Ambulatory Visit (INDEPENDENT_AMBULATORY_CARE_PROVIDER_SITE_OTHER): Payer: PRIVATE HEALTH INSURANCE | Admitting: Allergy

## 2019-10-03 ENCOUNTER — Other Ambulatory Visit: Payer: Self-pay

## 2019-10-03 ENCOUNTER — Encounter: Payer: Self-pay | Admitting: Allergy

## 2019-10-03 DIAGNOSIS — T781XXD Other adverse food reactions, not elsewhere classified, subsequent encounter: Secondary | ICD-10-CM

## 2019-10-03 DIAGNOSIS — Z889 Allergy status to unspecified drugs, medicaments and biological substances status: Secondary | ICD-10-CM

## 2019-10-03 DIAGNOSIS — T7840XD Allergy, unspecified, subsequent encounter: Secondary | ICD-10-CM | POA: Diagnosis not present

## 2019-10-03 DIAGNOSIS — Z91030 Bee allergy status: Secondary | ICD-10-CM | POA: Diagnosis not present

## 2019-10-03 DIAGNOSIS — E882 Lipomatosis, not elsewhere classified: Secondary | ICD-10-CM | POA: Diagnosis not present

## 2019-10-03 DIAGNOSIS — Z91038 Other insect allergy status: Secondary | ICD-10-CM

## 2019-10-03 DIAGNOSIS — J3089 Other allergic rhinitis: Secondary | ICD-10-CM

## 2019-10-03 MED ORDER — EPINEPHRINE 0.3 MG/0.3ML IJ SOAJ: 0.3000 mg | INTRAMUSCULAR | 2 refills | Status: DC | PRN

## 2019-10-03 MED ORDER — EPINEPHRINE 0.3 MG/0.3ML IJ SOAJ: 0.3000 mg | Freq: Once | INTRAMUSCULAR | 1 refills | Status: AC

## 2019-10-03 NOTE — Progress Notes (Signed)
RE: [name] [name] MRN: [id number] DOB: 01/11/1966 Date of Telemedicine Visit: 10/03/2019  Referring provider: Janus Molder, NP Primary care provider: Janus Molder, NP  Chief Complaint: Allergic Reaction   Telemedicine Follow Up Visit via Telephone: I connected with [name] [name] for a follow up on 10/03/19 by telephone and verified that I am speaking with the correct person using two identifiers.   I discussed the limitations, risks, security and privacy concerns of performing an evaluation and management service by telephone and the availability of in person appointments. I also discussed with the patient that there may be a patient responsible charge related to this service. The patient expressed understanding and agreed to proceed.  Patient is at home. Provider is at the office.  Visit start time: 10:09AM Visit end time: 10:47AM Insurance consent/check in by: front desk Medical consent and medical assistant/nurse: Da'Shaunia B.  History of Present Illness: She is a 54 y.o. female, who is being followed for allergic reactions, hymenoptera allergy, multiple drug allergies, allergic rhinitis, adverse food reaction, coughing, dercum disease. Her previous allergy office visit was on 07/04/2019 with Dr. Maudie Mercury via telemedicine. Today is a regular follow up visit.  Allergic reaction Patient had outpatient nail removal procedure on 08/29/2019 and had reaction to ? betadine.  Patient states that as soon as her skin was wiped down with the betadine she felt some palpitations, perioral itching and chest tightness. She also received Xylocaine Marcaine for localized anesthesia. She denies history of reactions to local anesthetics in the past.  Patient took benadryl at home which helped.  After the procedure she developed localized dermatitis and swelling. She was treated with doxycycline.  Patient used her Epipen about 2 days ago after she felt some chest tightness. Patient's spouse  is an ER physician and she also took benadryl and Pepcid and within 5 minutes felt better. She has not eaten anything of her ordinary diet that day. She was on about day 7 of her doxycycline. No previous reactions to doxycycline.   Hymenoptera allergy No stings since the last visit but is interested in pursuing allergy injections as she is outdoors quite a bit.   Other allergic rhinitis Did not try the nasal spray yet as she was not sure if it was steroid free or not.  Still having issues nasal congestion. No ENT evaluation. Still taking Xyzal daily.   Dercum disease Follows with dermatology, neurology, cardiology.  Adverse food reaction Currently avoiding mollusks.   Assessment and Plan: [name] [name] is a 54 y.o. 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 Bloodwork - hymenoptera panel was positive to honeybee, white faced hornet, yellow jacket, wasp, yellow hornet and bumblebee and fire ant. Shellfish panel and seafood panel were all negative. The scallop and oyster were borderline positive. Tryptase, blood count, urticaria index, and alpha gal were all normal. 2,3 Dinor-11Beta-Prostaglandin F2 Alpha, Urine - 2157pg/mg Cr (ref <5205pg/mg Cr) - normal.  Interim history - 2 reactions since the last visit. One was after an outpatient nail removal procedure and another one was while taking doxycycline.   Continue xyzal 5mg  at night.  Keep track of reactions.  Concerned if it was the local anesthetic rather than the betadine that caused the reaction.   If you have a reaction next time, please get tryptase level drawn within 2-3 hours. This will  help in identifying if it's truly an allergic reaction or some other type of adverse reaction that you are having.   For mild symptoms you  can take over the counter antihistamines such as Benadryl and monitor symptoms closely. If symptoms worsen or if you have severe symptoms including breathing issues, throat closure, significant swelling, whole body hives, severe diarrhea and vomiting, lightheadedness then inject epinephrine and seek immediate medical care afterwards.  Action plan in place.   Hymenoptera allergy Past history - Concern regarding hymenoptera allergy as she developed some respiratory symptoms after getting stung by a yellow jacket. Hymenoptera panel was positive to honeybee, white faced hornet, yellow jacket, wasp, yellow hornet and bumblebee and fire ant. Interim history - Patient interested in starting immunotherapy as she likes to be outdoors.   Continue avoidance.  Will start with mixed vespid injections (contains yellow jacket, yellow hornet and white faced hornet) with 0.0003 dose instead of the 0.003 dose given her history of allergic reactions.   If you do well with the build up then will add on honey bee and wasp together, then add on fire ant.   Carry Epipen and use for anaphylactic reactions as needed.   Multiple drug allergies Past history - Patient is sensitive to various medications. Some of the reactions are more adverse drug reactions rather then IgE mediated reactions.  Continue to avoid medications which gave her issues in the past. See allergy list.   Other allergic rhinitis Past history - Perennial rhino conjunctivitis symptoms for many years with worsening in the fall. Patient was on AIT for 3 years but the last year developed large localized reactions so she stopped the injections. She believes they were helping her symptoms. 2020 immunocap borderline positive to few pollens. Azelastine not effective.  Interim history - did not try Atrovent yet.   Start Atrovent nasal spray - 1 to 2 sprays per nostril up to three times a day as needed for drainage.  Recommend ENT evaluation next if nasal  spray does not help.   Continue xyzal 5mg  daily at night.   Continue environmental control measures to pollen.  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  Continue follow up with dermatology, neurology, cardiology.  If no endocrinology evaluation then may be worthwhile seeing them as well.   Adverse food reaction Past history - Currently avoiding gluten and spicy foods as they seem to trigger her Dercum's disease symptoms. Shrimp caused perioral pruritus in the past. Interim history - no reactions.   Continue to avoid shellfish and mollusks.   For mild symptoms you can take over the counter antihistamines such as Benadryl and monitor symptoms closely. If symptoms worsen or if you have severe symptoms including breathing issues, throat closure, significant swelling, whole body hives, severe diarrhea and vomiting, lightheadedness then inject epinephrine and seek immediate medical care afterwards.  Return in about 3 months (around 01/03/2020).  Meds ordered this encounter  Medications  . EPINEPHrine (EPIPEN 2-PAK) 0.3 mg/0.3 mL IJ SOAJ injection    Sig: Inject 0.3 mLs (0.3 mg total) into the muscle as needed.    Dispense:  2 each    Refill:  2  . EPINEPHrine (EPIPEN 2-PAK) 0.3 mg/0.3 mL IJ SOAJ injection    Sig: Inject 0.3 mLs (0.3 mg total) into the muscle once for 1 dose.    Dispense:  0.3 mL    Refill:  1   Diagnostics: None.  Medication List:  Current Outpatient Medications  Medication Sig Dispense Refill  . aspirin EC 81 MG tablet Take 1 tablet (81 mg total) by mouth daily. 90 tablet 3  . Brimonidine Tartrate (MIRVASO) 0.33 % GEL Mirvaso 0.33 % topical gel  APPLY TO FACE 1-2 TIMES DAILY    . Ciclopirox 1 % shampoo     . clobetasol (TEMOVATE) 0.05 % external solution clobetasol 0.05 % scalp solution  APPLY TO THE SCALP DAILY AS NEEDED FOR ITCH    . EPINEPHrine (EPIPEN 2-PAK) 0.3 mg/0.3 mL IJ SOAJ injection  Inject 0.3 mLs (0.3 mg total) into the muscle as needed. 2 each 2  . estradiol (ESTRACE) 1 MG tablet     . ketoconazole (NIZORAL) 2 % shampoo     . levocetirizine (XYZAL) 5 MG tablet Take 5 mg by mouth every evening.    . metroNIDAZOLE (METROGEL) 0.75 % gel metronidazole 0.75 % topical gel  APPLY ON THE SKIN DAILY FOR ROSACEA    . Multiple Vitamins-Calcium (ONE-A-DAY WOMENS PO) Take 1 tablet by mouth daily.    Marland Kitchen olmesartan (BENICAR) 20 MG tablet Take 20 mg by mouth daily.    . ondansetron (ZOFRAN ODT) 4 MG disintegrating tablet Take 1 tablet (4 mg total) by mouth every 8 (eight) hours as needed for nausea or vomiting. 6 tablet 0  . RABEprazole (ACIPHEX) 20 MG tablet Take 20 mg by mouth 2 (two) times daily.    . rizatriptan (MAXALT-MLT) 10 MG disintegrating tablet Take 10 mg by mouth every 2 (two) hours as needed for migraine.   11  . Vitamin D, Ergocalciferol, (DRISDOL) 50000 units CAPS capsule Take 50,000 Units by mouth every Wednesday.     Marland Kitchen EPINEPHrine (EPIPEN 2-PAK) 0.3 mg/0.3 mL IJ SOAJ injection Inject 0.3 mLs (0.3 mg total) into the muscle once for 1 dose. 0.3 mL 1   No current facility-administered medications for this visit.   Allergies: Allergies  Allergen Reactions  . Betadine [Povidone Iodine]     Throat swelling, itchy lips, redness/swelling at application site.  . Contrast Media [Iodinated Diagnostic Agents] Anaphylaxis  . Morphine And Related Anaphylaxis and Nausea And Vomiting  . Prednisone Anaphylaxis  . Yellow Jacket Venom [Bee Venom] Anaphylaxis  . Azithromycin Other (See Comments)    SEVERE STOMACH PAIN   . Erythromycin Nausea And Vomiting and Other (See Comments)    SEVERE STOMACH PAIN/abdominal pain   . Iohexol Hives, Itching and Swelling    Swelling of upper lip, thick tongue, hives on face and back and itching all over  . Oxycontin [Oxycodone] Nausea And Vomiting  . Avelox [Moxifloxacin Hcl In Nacl] Swelling  . Codeine Nausea And Vomiting and Other (See  Comments)    Headaches also  . Gadolinium Derivatives Hives, Itching and Swelling  . Iodine   . Levaquin [Levofloxacin In D5w] Swelling  . Oxycodone-Acetaminophen Nausea And Vomiting  . Pantoprazole Other (See Comments)    "Feels like I have the flu"  . Shellfish Allergy   . Augmentin [Amoxicillin-Pot Clavulanate] Nausea And Vomiting  . Dilaudid [Hydromorphone Hcl] Nausea And Vomiting  . Oxycodone Hcl Nausea And Vomiting  . Tramadol Nausea Only   I reviewed her past medical history, social history, family history, and environmental history and no significant changes have been reported from her previous visit.  Review of Systems  Constitutional: Negative for appetite change, chills and fever.  HENT: Positive for congestion, postnasal drip and sinus pressure. Negative for rhinorrhea.   Eyes: Negative for itching.  Respiratory: Negative  for chest tightness, shortness of breath and wheezing.   Cardiovascular: Negative for chest pain.  Gastrointestinal: Negative for abdominal pain.  Genitourinary: Negative for difficulty urinating.  Skin: Negative for rash.  Allergic/Immunologic: Positive for environmental allergies.   Objective: Physical Exam Not obtained as encounter was done via telephone.   Previous notes and tests were reviewed.  I discussed the assessment and treatment plan with the patient. The patient was provided an opportunity to ask questions and all were answered. The patient agreed with the plan and demonstrated an understanding of the instructions. After visit summary/patient instructions available via mychart.   The patient was advised to call back or seek an in-person evaluation if the symptoms worsen or if the condition fails to improve as anticipated.  I provided 38 minutes of non-face-to-face time during this encounter.  It was my pleasure to participate in [place] Bratton's care today. Please feel free to contact me with any questions or concerns.    Sincerely,  [name], DO Allergy & Immunology  [facility] office: [phone] [facility] office: [place] office: [phone]

## 2019-10-03 NOTE — Assessment & Plan Note (Signed)
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 Patient was on AIT for 3 years but the last year developed large localized reactions so she stopped the injections. She believes they were helping her symptoms. 2020 immunocap borderline positive to few pollens. Azelastine not effective.  Interim history - did not try Atrovent yet.   Start Atrovent nasal spray - 1 to 2 sprays per nostril up to three times a day as needed for drainage.  Recommend ENT evaluation next if nasal spray does not help.   Continue xyzal 5mg  daily at night.   Continue environmental control measures to pollen.

## 2019-10-03 NOTE — Assessment & Plan Note (Signed)
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  Continue follow up with dermatology, neurology, cardiology.  If no endocrinology evaluation then may be worthwhile seeing them as well.

## 2019-10-03 NOTE — Assessment & Plan Note (Signed)
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 Shrimp caused perioral pruritus in the past. Interim history - no reactions.   Continue to avoid shellfish and mollusks.   For mild symptoms you can take over the counter antihistamines such as Benadryl and monitor symptoms closely. If symptoms worsen or if you have severe symptoms including breathing issues, throat closure, significant swelling, whole body hives, severe diarrhea and vomiting, lightheadedness then inject epinephrine and seek immediate medical care afterwards.

## 2019-10-03 NOTE — Assessment & Plan Note (Signed)
Past history - Concern regarding hymenoptera allergy as she developed some respiratory symptoms after getting stung by a yellow jacket. Hymenoptera panel was positive to honeybee, white faced hornet, yellow jacket, wasp, yellow hornet and bumblebee and fire ant. Interim history - Patient interested in starting immunotherapy as she likes to be outdoors.   Continue avoidance.  Will start with mixed vespid injections (contains yellow jacket, yellow hornet and white faced hornet) with 0.0003 dose instead of the 0.003 dose given her history of allergic reactions.   If you do well with the build up then will add on honey bee and wasp together, then add on fire ant.   Carry Epipen and use for anaphylactic reactions as needed.

## 2019-10-03 NOTE — Assessment & Plan Note (Signed)
Past history - Patient is sensitive to various medications. Some of the reactions are more adverse drug reactions rather then IgE mediated reactions.  Continue to avoid medications which gave her issues in the past. See allergy list.  

## 2019-10-03 NOTE — Assessment & Plan Note (Addendum)
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 Bloodwork - hymenoptera panel was positive to honeybee, white faced hornet, yellow jacket, wasp, yellow hornet and bumblebee and fire ant. Shellfish panel and seafood panel were all negative. The scallop and oyster were borderline positive. Tryptase, blood count, urticaria index, and alpha gal were all normal. 2,3 Dinor-11Beta-Prostaglandin F2 Alpha, Urine - 2157pg/mg Cr (ref <5205pg/mg Cr) - normal.  Interim history - 2 reactions since the last visit. One was after an outpatient nail removal procedure and another one was while taking doxycycline.   Continue xyzal 5mg  at night.  Keep track of reactions.  Concerned if it was the local anesthetic rather than the betadine that caused the reaction.   If you have a reaction next time, please get tryptase level drawn within 2-3 hours. This will help in identifying if it's truly an allergic reaction or some other type of adverse reaction that you are having.   For mild symptoms you can take over the counter antihistamines such as Benadryl and monitor symptoms closely. If symptoms worsen or if you have severe symptoms including breathing issues, throat closure, significant swelling, whole body hives, severe diarrhea and vomiting, lightheadedness then inject epinephrine and seek immediate medical care afterwards.  Action plan in place.

## 2019-10-03 NOTE — Patient Instructions (Addendum)
Allergic reaction  Continue xyzal 5mg  at night.  Keep track of reactions.  Concerned if it was the local anesthetic rather than the betadine that caused the reaction.   If you have a reaction next time, please get tryptase level drawn within 2-3 hours. This will help in identifying if it's truly an allergic reaction or some other type of adverse reaction that you are having.   For mild symptoms you can take over the counter antihistamines such as Benadryl and monitor symptoms closely. If symptoms worsen or if you have severe symptoms including breathing issues, throat closure, significant swelling, whole body hives, severe diarrhea and vomiting, lightheadedness then inject epinephrine and seek immediate medical care afterwards.  Action plan in place.   Hymenoptera allergy Past history - Hymenoptera panel was positive to honeybee, white faced hornet, yellow jacket, wasp, yellow hornet and bumblebee and fire ant.  Continue avoidance.  Will start with mixed vespid injections (contains yellow jacket, yellow hornet and white faced hornet).  If you do well with the build up then will add on honey bee and wasp together, then add on fire ant.   Carry Epipen and use for anaphylactic reactions as needed.   Multiple drug allergies  Continue to avoid medications which gave you problems in the past.  Other allergic rhinitis  Start Atrovent nasal spray - 1 to 2 sprays per nostril up to three times a day as needed for drainage.  Recommend ENT evaluation next if nasal spray does not help.   Continue xyzal 5mg  daily at night.   Continue environmental control measures to pollen.  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  If no endocrinology evaluation then may be worthwhile seeing them as well.   Adverse food reaction  Continue to avoid shellfish and mollusks.   Follow up in 3 months or sooner if needed.    Sincerely,  [name],  DO Allergy & Immunology  [facility] office: [phone] [facility] office: [place] office: [phone]

## 2019-10-04 ENCOUNTER — Encounter: Payer: Self-pay | Admitting: Neurology

## 2019-10-04 ENCOUNTER — Other Ambulatory Visit: Payer: Self-pay

## 2019-10-04 DIAGNOSIS — E7801 Familial hypercholesterolemia: Secondary | ICD-10-CM

## 2019-10-04 DIAGNOSIS — Z79899 Other long term (current) drug therapy: Secondary | ICD-10-CM

## 2019-10-04 DIAGNOSIS — Z8249 Family history of ischemic heart disease and other diseases of the circulatory system: Secondary | ICD-10-CM

## 2019-10-05 ENCOUNTER — Ambulatory Visit (INDEPENDENT_AMBULATORY_CARE_PROVIDER_SITE_OTHER): Payer: PRIVATE HEALTH INSURANCE

## 2019-10-05 ENCOUNTER — Ambulatory Visit: Payer: PRIVATE HEALTH INSURANCE | Admitting: Podiatry

## 2019-10-05 ENCOUNTER — Telehealth: Payer: Self-pay | Admitting: *Deleted

## 2019-10-05 ENCOUNTER — Other Ambulatory Visit: Payer: Self-pay

## 2019-10-05 DIAGNOSIS — M7752 Other enthesopathy of left foot: Secondary | ICD-10-CM

## 2019-10-05 DIAGNOSIS — M79672 Pain in left foot: Secondary | ICD-10-CM | POA: Diagnosis not present

## 2019-10-05 DIAGNOSIS — B351 Tinea unguium: Secondary | ICD-10-CM | POA: Diagnosis not present

## 2019-10-05 DIAGNOSIS — L600 Ingrowing nail: Secondary | ICD-10-CM | POA: Diagnosis not present

## 2019-10-05 DIAGNOSIS — L853 Xerosis cutis: Secondary | ICD-10-CM | POA: Diagnosis not present

## 2019-10-05 MED ORDER — AMMONIUM LACTATE 12 % EX LOTN: 1.0000 "application " | TOPICAL_LOTION | CUTANEOUS | 0 refills | Status: DC | PRN

## 2019-10-05 MED ORDER — CICLOPIROX 1 % EX SHAM: 1.0000 mL | MEDICATED_SHAMPOO | Freq: Two times a day (BID) | CUTANEOUS | 0 refills | Status: DC

## 2019-10-05 NOTE — Telephone Encounter (Signed)
Pt called states her procedure toe is in excruciating burning, stabbing pain worse than any abdominal surgery and she is 4 minutes from our office. Dr. Posey Pronto stated get her in to see me now. I informed pt and she states she will be right in.

## 2019-10-05 NOTE — Telephone Encounter (Signed)
I will have to research that- local message for lymphatic drainage.  I will send a request for help to [place] office, cc Janus Molder, NP CD

## 2019-10-08 ENCOUNTER — Other Ambulatory Visit: Payer: Self-pay

## 2019-10-08 ENCOUNTER — Ambulatory Visit (INDEPENDENT_AMBULATORY_CARE_PROVIDER_SITE_OTHER): Payer: PRIVATE HEALTH INSURANCE | Admitting: Pharmacist

## 2019-10-08 DIAGNOSIS — E782 Mixed hyperlipidemia: Secondary | ICD-10-CM | POA: Diagnosis not present

## 2019-10-08 DIAGNOSIS — Z8249 Family history of ischemic heart disease and other diseases of the circulatory system: Secondary | ICD-10-CM | POA: Diagnosis not present

## 2019-10-08 MED ORDER — PRAVASTATIN SODIUM 20 MG PO TABS: 20.0000 mg | ORAL_TABLET | Freq: Every evening | ORAL | 3 refills | Status: DC

## 2019-10-08 NOTE — Progress Notes (Deleted)
Patient ID: [name] [name]                 DOB: [birth year]                    MRN: [id number]     HPI: [name] [name] is a 54 y.o. female patient referred to lipid clinic by Dr. [name]. PMH is significant for obesity, hypertension, Dercum's disease, chest pain, and elevated lipoprotein a.  Last visit Sept 2020 patient had multiple questions regarding CV risk reduction therapies including aspirin and statins but was concerned how it would affect her Dercum's disease. 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 Chest CT from 12/2018 showed no lipoma involvement in mediastinum or cardiovascular region. Rosuvastatin 10mg  daily and aspirin 81mg  daily were started and LDL was reduced from 128 to 52 but patient began having muscle pain (possibly due to Dercum's but difficult to differentiate) and it was stopped. Patient tried taking the rosuvastatin once more at the beginning of March 2020 but stopped after a few weeks due to the same pain again.   Patient presents to clinic today for follow up of hyperlipidemia treatment. Patient endorsed always being very active, fit, and health conscious prior to her Dercum's diagnoses due to her extensive family history of CAD. Because of her disease, however, she has been unable to participate in regular exercise but does continue a heart healthy vegetarian/vegan diet. Ms. Greiser has never been on any other statin medication. Patient reports being off of rosuvastatin for a week and a half since restarting the med and requests having a fasting lipid panel done today to get a better idea of her baseline. She is aware there may still be some residual effects of rosuvastatin and labs may not indicate a true baseline.   Current Medications: none Intolerances: rosuvastatin 10mg  daily (foot cramps, right calf cramp, pain in back  (between should blades), pressure, tightness and pain in right side of chest, pain upper right abdomen (started dull now very sharp mostly on the right side), runny nose, anaphylaxis (sore throat, irritated inside of mouth), indigestion Risk Factors: family history (familial hypercholesterolemia, early CAD), obesity, hypertension, elevated lipoprotein a LDL goal: <70  Diet: vegan/vegetarian, heart healthy  Exercise: minimal due to Dercum's disease (can increase fat formation and inflammation)  Family History: family history of early CAD (heart attack in 77's and 25's), heart disease (father), hypertension (mother), maternal grandfather (stroke)  Social History: never smoked, denies drug or alcohol use  Labs: 06/20/2019 TG 130, HDL 69, LDL 52 (rosuvastatin 10mg  daily), lipoprotein a 207.5  Past Medical History:  Diagnosis Date  . Anemia    many years ago  . Anxiety    situational  . Arthritis    "hands" (07/12/2012)  . Asthma    seasonal   . Chest pain   . Chest pain at rest, on going 07/12/2012  . Eczema   . Family history of early CAD 07/12/2012  . Fibromyalgia   . GERD (gastroesophageal reflux disease)   . Headache(784.0)    "often; not daily" (07/12/2012)  . Hypertension    not on medications  . Kidney stones   . Migraine   . Pneumonia    as a child  . PONV (postoperative nausea and vomiting)    she states she gets very sick    Current Outpatient Medications on  File Prior to Visit  Medication Sig Dispense Refill  . ammonium lactate (AMLACTIN) 12 % lotion Apply 1 application topically as needed for dry skin. 400 g 0  . aspirin EC 81 MG tablet Take 1 tablet (81 mg total) by mouth daily. 90 tablet 3  . Brimonidine Tartrate (MIRVASO) 0.33 % GEL Mirvaso 0.33 % topical gel  APPLY TO FACE 1-2 TIMES DAILY    . Ciclopirox 1 % shampoo Apply 1 mL topically 2 (two) times daily. 120 mL 0  . clobetasol (TEMOVATE) 0.05 % external solution clobetasol 0.05 % scalp solution  APPLY  TO THE SCALP DAILY AS NEEDED FOR ITCH    . EPINEPHrine (EPIPEN 2-PAK) 0.3 mg/0.3 mL IJ SOAJ injection Inject 0.3 mLs (0.3 mg total) into the muscle as needed. 2 each 2  . estradiol (ESTRACE) 1 MG tablet     . ketoconazole (NIZORAL) 2 % shampoo     . levocetirizine (XYZAL) 5 MG tablet Take 5 mg by mouth every evening.    . metroNIDAZOLE (METROGEL) 0.75 % gel metronidazole 0.75 % topical gel  APPLY ON THE SKIN DAILY FOR ROSACEA    . Multiple Vitamins-Calcium (ONE-A-DAY WOMENS PO) Take 1 tablet by mouth daily.    Marland Kitchen olmesartan (BENICAR) 20 MG tablet Take 20 mg by mouth daily.    . ondansetron (ZOFRAN ODT) 4 MG disintegrating tablet Take 1 tablet (4 mg total) by mouth every 8 (eight) hours as needed for nausea or vomiting. 6 tablet 0  . RABEprazole (ACIPHEX) 20 MG tablet Take 20 mg by mouth 2 (two) times daily.    . rizatriptan (MAXALT-MLT) 10 MG disintegrating tablet Take 10 mg by mouth every 2 (two) hours as needed for migraine.   11  . Vitamin D, Ergocalciferol, (DRISDOL) 50000 units CAPS capsule Take 50,000 Units by mouth every Wednesday.      No current facility-administered medications on file prior to visit.    Allergies  Allergen Reactions  . Betadine [Povidone Iodine]     Throat swelling, itchy lips, redness/swelling at application site.  . Contrast Media [Iodinated Diagnostic Agents] Anaphylaxis  . Morphine And Related Anaphylaxis and Nausea And Vomiting  . Prednisone Anaphylaxis  . Yellow Jacket Venom [Bee Venom] Anaphylaxis  . Azithromycin Other (See Comments)    SEVERE STOMACH PAIN   . Erythromycin Nausea And Vomiting and Other (See Comments)    SEVERE STOMACH PAIN/abdominal pain   . Iohexol Hives, Itching and Swelling    Swelling of upper lip, thick tongue, hives on face and back and itching all over  . Oxycontin [Oxycodone] Nausea And Vomiting  . Avelox [Moxifloxacin Hcl In Nacl] Swelling  . Codeine Nausea And Vomiting and Other (See Comments)    Headaches also  .  Gadolinium Derivatives Hives, Itching and Swelling  . Iodine   . Levaquin [Levofloxacin In D5w] Swelling  . Oxycodone-Acetaminophen Nausea And Vomiting  . Pantoprazole Other (See Comments)    "Feels like I have the flu"  . Shellfish Allergy   . Augmentin [Amoxicillin-Pot Clavulanate] Nausea And Vomiting  . Dilaudid [Hydromorphone Hcl] Nausea And Vomiting  . Oxycodone Hcl Nausea And Vomiting  . Tramadol Nausea Only    Assessment/Plan:  1. Hyperlipidemia - Based on goal of LDL <70, patient is most likely not at goal after being off of rosuvastatin for a few months. Will follow up on lipid panel from today. Pt has singular intolerance to rosuvastatin and is willing to rechallenge with low dose of pravastatin 20mg . Patient was  counseled to start the medication at half the lowest dose for a week, then try taking a full tablet once daily. If this dose causes muscle or joint pain, patient can take the medication three times weekly. Ms. Druschel will follow up with lab in 8 weeks for a lipid panel and LFTs but was encouraged to give the clinic a call if she experiences muscle pains. We briefly reviewed other LDL lowering options including Zetia and PCSK9 inhibitors but emphasized the benefit of statins in preventing ASCVD events and lowering inflammation. Ms. Meininger was agreeable to trial pravastatin.  Ladoris Gene, PharmD Candidate  Megan E. Supple, PharmD, BCACP, [place] [id number] [place], [place] [address] Phone: [phone]; Fax: [phone] 10/08/2019 3:22 PM

## 2019-10-08 NOTE — Patient Instructions (Addendum)
It was nice meeting you today!  Start pravastatin 10mg  daily for a week, then increase to 20mg  daily. Monitor for symptoms of muscle or joint pain. If present, you can take the medication three times weekly.   Follow up in clinic in 8 weeks for fasting lab work. Tuesday, May 4th anytime after 7:30am.

## 2019-10-08 NOTE — Progress Notes (Signed)
Patient ID: [name] [name]                 DOB: 08-04-1965                    MRN: [id number]     HPI: [name] [name] is a 54 y.o. female patient referred to lipid clinic by Dr. Tamala [name]. PMH is significant for obesity, hypertension, Dercum's disease, chest pain, and elevated lipoprotein a.  Last visit Sept 2020 patient had multiple questions regarding CV risk reduction therapies including aspirin and statins but was concerned how it would affect her Dercum's disease. 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 Chest CT from 12/2018 showed no lipoma involvement in mediastinum or cardiovascular region. Rosuvastatin 10mg  daily and aspirin 81mg  daily were started and LDL was reduced from 128 to 52 but patient began having muscle pain (possibly due to Dercum's but difficult to differentiate) and it was stopped. Patient tried taking the rosuvastatin once more at the beginning of March 2020 but stopped after a few weeks due to the same pain again.   Patient presents to clinic today for follow up of hyperlipidemia treatment. Patient endorsed always being very active, fit, and health conscious prior to her Dercum's diagnoses due to her extensive family history of CAD. Because of her disease, however, she has been unable to participate in regular exercise but does continue a heart healthy vegetarian/vegan diet. [name] [name] has never been on any other statin medication. Patient reports being off of rosuvastatin for a week and a half since restarting the med and requests having a fasting lipid panel done today to get a better idea of her baseline. She is aware there may still be some residual effects of rosuvastatin and labs may not indicate a true baseline.   Current Medications: none Intolerances: rosuvastatin 10mg  daily (foot cramps, right calf cramp, pain in back  (between should blades), pressure, tightness and pain in right side of chest, pain upper right abdomen (started dull now very sharp mostly on the right side), runny nose, anaphylaxis (sore throat, irritated inside of mouth), indigestion Risk Factors: family history (familial hypercholesterolemia, early CAD), obesity, hypertension, elevated lipoprotein a LDL goal: <70  Diet: vegan/vegetarian, heart healthy  Exercise: minimal due to Dercum's disease (can increase fat formation and inflammation)  Family History: family history of early CAD (heart attack in 78's and 22's), heart disease (father), hypertension (mother), maternal grandfather (stroke)  Social History: never smoked, denies drug or alcohol use  Labs: 06/20/2019 TG 130, HDL 69, LDL 52 (rosuvastatin 10mg  daily), lipoprotein a 207.5  Past Medical History:  Diagnosis Date  . Anemia    many years ago  . Anxiety    situational  . Arthritis    "hands" (07/12/2012)  . Asthma    seasonal   . Chest pain   . Chest pain at rest, on going 07/12/2012  . Eczema   . Family history of early CAD 07/12/2012  . Fibromyalgia   . GERD (gastroesophageal reflux disease)   . Headache(784.0)    "often; not daily" (07/12/2012)  . Hypertension    not on medications  . Kidney stones   . Migraine   . Pneumonia    as a child  . PONV (postoperative nausea and vomiting)    she states she gets very sick    Current Outpatient Medications on  File Prior to Visit  Medication Sig Dispense Refill  . ammonium lactate (AMLACTIN) 12 % lotion Apply 1 application topically as needed for dry skin. 400 g 0  . aspirin EC 81 MG tablet Take 1 tablet (81 mg total) by mouth daily. 90 tablet 3  . Brimonidine Tartrate (MIRVASO) 0.33 % GEL Mirvaso 0.33 % topical gel  APPLY TO FACE 1-2 TIMES DAILY    . Ciclopirox 1 % shampoo Apply 1 mL topically 2 (two) times daily. 120 mL 0  . clobetasol (TEMOVATE) 0.05 % external solution clobetasol 0.05 % scalp solution   APPLY TO THE SCALP DAILY AS NEEDED FOR ITCH    . EPINEPHrine (EPIPEN 2-PAK) 0.3 mg/0.3 mL IJ SOAJ injection Inject 0.3 mLs (0.3 mg total) into the muscle as needed. 2 each 2  . estradiol (ESTRACE) 1 MG tablet     . ketoconazole (NIZORAL) 2 % shampoo     . levocetirizine (XYZAL) 5 MG tablet Take 5 mg by mouth every evening.    . metroNIDAZOLE (METROGEL) 0.75 % gel metronidazole 0.75 % topical gel  APPLY ON THE SKIN DAILY FOR ROSACEA    . Multiple Vitamins-Calcium (ONE-A-DAY WOMENS PO) Take 1 tablet by mouth daily.    Marland Kitchen olmesartan (BENICAR) 20 MG tablet Take 20 mg by mouth daily.    . ondansetron (ZOFRAN ODT) 4 MG disintegrating tablet Take 1 tablet (4 mg total) by mouth every 8 (eight) hours as needed for nausea or vomiting. 6 tablet 0  . RABEprazole (ACIPHEX) 20 MG tablet Take 20 mg by mouth 2 (two) times daily.    . rizatriptan (MAXALT-MLT) 10 MG disintegrating tablet Take 10 mg by mouth every 2 (two) hours as needed for migraine.   11  . Vitamin D, Ergocalciferol, (DRISDOL) 50000 units CAPS capsule Take 50,000 Units by mouth every Wednesday.      No current facility-administered medications on file prior to visit.    Allergies  Allergen Reactions  . Betadine [Povidone Iodine]     Throat swelling, itchy lips, redness/swelling at application site.  . Contrast Media [Iodinated Diagnostic Agents] Anaphylaxis  . Morphine And Related Anaphylaxis and Nausea And Vomiting  . Prednisone Anaphylaxis  . Yellow Jacket Venom [Bee Venom] Anaphylaxis  . Azithromycin Other (See Comments)    SEVERE STOMACH PAIN   . Erythromycin Nausea And Vomiting and Other (See Comments)    SEVERE STOMACH PAIN/abdominal pain   . Iohexol Hives, Itching and Swelling    Swelling of upper lip, thick tongue, hives on face and back and itching all over  . Oxycontin [Oxycodone] Nausea And Vomiting  . Avelox [Moxifloxacin Hcl In Nacl] Swelling  . Codeine Nausea And Vomiting and Other (See Comments)    Headaches also  .  Gadolinium Derivatives Hives, Itching and Swelling  . Iodine   . Levaquin [Levofloxacin In D5w] Swelling  . Oxycodone-Acetaminophen Nausea And Vomiting  . Pantoprazole Other (See Comments)    "Feels like I have the flu"  . Shellfish Allergy   . Augmentin [Amoxicillin-Pot Clavulanate] Nausea And Vomiting  . Dilaudid [Hydromorphone Hcl] Nausea And Vomiting  . Oxycodone Hcl Nausea And Vomiting  . Tramadol Nausea Only    Assessment/Plan:  1. Hyperlipidemia - Based on goal of LDL <70, patient is most likely not at goal after being off of rosuvastatin for a few months. Will follow up on lipid panel from today. Pt has singular intolerance to rosuvastatin and is willing to rechallenge with low dose of pravastatin 20mg . Patient was  counseled to start the medication at half the lowest dose for a week, then try taking a full tablet once daily. If this dose causes muscle or joint pain, patient can take the medication three times weekly. [name] [name] will follow up with lab in 8 weeks for a lipid panel and LFTs but was encouraged to give the clinic a call if she experiences muscle pains. We briefly reviewed other LDL lowering options including Zetia and PCSK9 inhibitors but emphasized the benefit of statins in preventing ASCVD events and lowering inflammation. [name] [name] was agreeable to trial pravastatin.  Ladoris Gene, PharmD Candidate  Aemon Koeller E. Adaisha Campise, PharmD, BCACP, [place] [id number] [place], [place] [address] Phone: [phone]; Fax: [phone] 10/08/2019 3:22 PM

## 2019-10-09 ENCOUNTER — Other Ambulatory Visit: Payer: Self-pay | Admitting: Podiatry

## 2019-10-09 ENCOUNTER — Telehealth: Payer: Self-pay | Admitting: Interventional Cardiology

## 2019-10-09 ENCOUNTER — Encounter: Payer: Self-pay | Admitting: Podiatry

## 2019-10-09 DIAGNOSIS — M7752 Other enthesopathy of left foot: Secondary | ICD-10-CM

## 2019-10-09 LAB — LIPID PANEL
Chol/HDL Ratio: 3.6 ratio (ref 0.0–4.4)
Cholesterol, Total: 236 mg/dL — ABNORMAL HIGH (ref 100–199)
HDL: 65 mg/dL (ref >39)
LDL Chol Calc (NIH): 126 mg/dL — ABNORMAL HIGH (ref 0–99)
Triglycerides: 256 mg/dL — ABNORMAL HIGH (ref 0–149)
VLDL Cholesterol Cal: 45 mg/dL — ABNORMAL HIGH (ref 5–40)

## 2019-10-09 NOTE — Progress Notes (Addendum)
Subjective:  Patient ID: [name] [name], female    DOB: 1966-04-23,  MRN: [id number]  Chief Complaint  Patient presents with  . Nail Problem    Left 1st toenail ingrown nail follow up. Pt states she has extreme pain in her left toe/toenail such that even touching the top of the nail bed induces severe pain. Pt states she just finished Doxycycline 4 days ago. Denies fever/chills/nausea/vomiting.    54 y.o. female presents with the above complaint.  Patient presents with left great toe medial border nail avulsion that was performed by Dr. Paulla Dolly couple of months ago.  Patient states that she is still in excruciating pain.  Patient has a history of fibromyalgia.  Patient states that this has been hurting has not gotten better.  There is some redness associated with it.  Patient is well-known to Dr. Paulla Dolly.  She states that she does not know what to do about this pain.  She does not want to remove the entire toenail.  She states is right along the medial corner.  She has tried Epson salt soaks which has not helped.  She would like to know if there is anything else that could be done.  Patient has taken antibiotics in the past which has not changed the amount of redness associated with it.   Review of Systems: Negative except as noted in the HPI. Denies N/V/F/Ch.  Past Medical History:  Diagnosis Date  . Anemia    many years ago  . Anxiety    situational  . Arthritis    "hands" (07/12/2012)  . Asthma    seasonal   . Chest pain   . Chest pain at rest, on going 07/12/2012  . Eczema   . Family history of early CAD 07/12/2012  . Fibromyalgia   . GERD (gastroesophageal reflux disease)   . Headache(784.0)    "often; not daily" (07/12/2012)  . Hypertension    not on medications  . Kidney stones   . Migraine   . Pneumonia    as a child  . PONV (postoperative nausea and vomiting)    she states she gets very sick    Current Outpatient Medications:  .  aspirin EC 81 MG tablet,  Take 1 tablet (81 mg total) by mouth daily., Disp: 90 tablet, Rfl: 3 .  Brimonidine Tartrate (MIRVASO) 0.33 % GEL, Mirvaso 0.33 % topical gel  APPLY TO FACE 1-2 TIMES DAILY, Disp: , Rfl:  .  Ciclopirox 1 % shampoo, Apply 1 mL topically 2 (two) times daily., Disp: 120 mL, Rfl: 0 .  clobetasol (TEMOVATE) 0.05 % external solution, clobetasol 0.05 % scalp solution  APPLY TO THE SCALP DAILY AS NEEDED FOR ITCH, Disp: , Rfl:  .  EPINEPHrine (EPIPEN 2-PAK) 0.3 mg/0.3 mL IJ SOAJ injection, Inject 0.3 mLs (0.3 mg total) into the muscle as needed., Disp: 2 each, Rfl: 2 .  estradiol (ESTRACE) 1 MG tablet, , Disp: , Rfl:  .  ketoconazole (NIZORAL) 2 % shampoo, , Disp: , Rfl:  .  levocetirizine (XYZAL) 5 MG tablet, Take 5 mg by mouth every evening., Disp: , Rfl:  .  metroNIDAZOLE (METROGEL) 0.75 % gel, metronidazole 0.75 % topical gel  APPLY ON THE SKIN DAILY FOR ROSACEA, Disp: , Rfl:  .  Multiple Vitamins-Calcium (ONE-A-DAY WOMENS PO), Take 1 tablet by mouth daily., Disp: , Rfl:  .  olmesartan (BENICAR) 20 MG tablet, Take 20 mg by mouth daily., Disp: , Rfl:  .  ondansetron (ZOFRAN ODT) 4  MG disintegrating tablet, Take 1 tablet (4 mg total) by mouth every 8 (eight) hours as needed for nausea or vomiting., Disp: 6 tablet, Rfl: 0 .  RABEprazole (ACIPHEX) 20 MG tablet, Take 20 mg by mouth 2 (two) times daily., Disp: , Rfl:  .  rizatriptan (MAXALT-MLT) 10 MG disintegrating tablet, Take 10 mg by mouth every 2 (two) hours as needed for migraine. , Disp: , Rfl: 11 .  Vitamin D, Ergocalciferol, (DRISDOL) 50000 units CAPS capsule, Take 50,000 Units by mouth every Wednesday. , Disp: , Rfl:  .  ammonium lactate (AMLACTIN) 12 % lotion, Apply 1 application topically as needed for dry skin., Disp: 400 g, Rfl: 0 .  pravastatin (PRAVACHOL) 20 MG tablet, Take 1 tablet (20 mg total) by mouth every evening., Disp: 30 tablet, Rfl: 3  Social History   Tobacco Use  Smoking Status Never Smoker  Smokeless Tobacco Never Used     Allergies  Allergen Reactions  . Betadine [Povidone Iodine]     Throat swelling, itchy lips, redness/swelling at application site.  . Contrast Media [Iodinated Diagnostic Agents] Anaphylaxis  . Morphine And Related Anaphylaxis and Nausea And Vomiting  . Prednisone Anaphylaxis  . Yellow Jacket Venom [Bee Venom] Anaphylaxis  . Azithromycin Other (See Comments)    SEVERE STOMACH PAIN   . Erythromycin Nausea And Vomiting and Other (See Comments)    SEVERE STOMACH PAIN/abdominal pain   . Iohexol Hives, Itching and Swelling    Swelling of upper lip, thick tongue, hives on face and back and itching all over  . Oxycontin [Oxycodone] Nausea And Vomiting  . Avelox [Moxifloxacin Hcl In Nacl] Swelling  . Codeine Nausea And Vomiting and Other (See Comments)    Headaches also  . Gadolinium Derivatives Hives, Itching and Swelling  . Iodine   . Levaquin [Levofloxacin In D5w] Swelling  . Oxycodone-Acetaminophen Nausea And Vomiting  . Pantoprazole Other (See Comments)    "Feels like I have the flu"  . Shellfish Allergy   . Augmentin [Amoxicillin-Pot Clavulanate] Nausea And Vomiting  . Dilaudid [Hydromorphone Hcl] Nausea And Vomiting  . Oxycodone Hcl Nausea And Vomiting  . Tramadol Nausea Only   Objective:  There were no vitals filed for this visit. There is no height or weight on file to calculate BMI. Constitutional Well developed. Well nourished.  Vascular Dorsalis pedis pulses palpable bilaterally. Posterior tibial pulses palpable bilaterally. Capillary refill normal to all digits.  No cyanosis or clubbing noted. Pedal hair growth normal.  Neurologic Normal speech. Oriented to person, place, and time. Epicritic sensation to light touch grossly present bilaterally.  Dermatologic Painful ingrowing nail at medial nail borders of the hallux nail left status post nail avulsion of the medial corner.  There is some redness associated with it.  The redness appears more as chemical burn as  opposed to infectious No other open wounds. No skin lesions.  Orthopedic: Normal joint ROM without pain or crepitus bilaterally. No visible deformities. No bony tenderness.   Radiographs: None Assessment:   1. Pain in left foot   2. Ingrown nail   3. Dermatophytosis of nail   4. Xerosis cutis    Plan:  Patient was evaluated and treated and all questions answered.  Ingrown Nail, left great toe medial border status post nail avulsion -I discussed with the patient all possible etiologies that could be causing the redness around the great toe with associated medial border nail pain.  Upon evaluation there does not appear to be nail growing back in  the corner.  However a couple of other possibility could be associated with phenol chemical burn versus reaction to the lidocaine/Marcaine.  Patient has extensive medical history with fibromyalgia as well.  There is a component of fibromyalgia that could be causing the pain out of proportion of the surgery. -Patient is also concerned with a little bit of discoloration on the nail as well.  I believe this could likely be due to fungus itself.  Patient would like to try topical medication Penlac.  I have dispensed Penlac to the pharmacy. -Patient also has a concern for dryness to the right heel.  She would like to know if there is something that could be done for this.  AmLactin lotion was dispensed for this. -I also explained to the patient that changes in the shoe gear can definitely help with the pressure associated at the first great toenail.  Patient has to wear very tight shoes that could be causing some pain associated with it.  I explained to the patient to obtain new balance with wide toe box to help decrease some of the pressure associated with it. -A total of 47 minutes was spent in direct patient care as well as pre and post patient encounter activities.  This includes documentation as well as reviewing patient chart for labs, imaging, past  medical, surgical, social, and family history as documented in the EMR.  I have reviewed medication allergies as documented in EMR.  I discussed the etiology of condition and treatment options from conservative to surgical care.  All risks and benefit of the treatment course was discussed in detail.  All questions were answered and return appointment was discussed.  Since the visit completed in an ambulatory/outpatient setting, the patient and/or parent/guardian has been advised to contact the providers office for worsening condition and seek medical treatment and/or call 911 if the patient deems either is necessary.  Return in about 4 weeks (around 11/02/2019).

## 2019-10-09 NOTE — Telephone Encounter (Signed)
Patient is returning call.  °

## 2019-10-10 ENCOUNTER — Telehealth: Payer: Self-pay

## 2019-10-10 NOTE — Telephone Encounter (Signed)
I called and left patient a message to set up virtual visit with Sharee Pimple to discuss cholesterol/medications.

## 2019-10-10 NOTE — Telephone Encounter (Signed)
Started in error

## 2019-10-10 NOTE — Telephone Encounter (Signed)
Addressed in other MyChart messages.

## 2019-10-11 ENCOUNTER — Ambulatory Visit: Payer: PRIVATE HEALTH INSURANCE | Attending: Internal Medicine

## 2019-10-11 DIAGNOSIS — Z23 Encounter for immunization: Secondary | ICD-10-CM

## 2019-10-11 NOTE — Telephone Encounter (Signed)
I called and spoke with patient, she would like to do a virtual visit to discuss cholesterol and new cholesterol medication she was started on. Scheduled with Kathyrn Drown for 10/17/19 at 11:45AM.

## 2019-10-11 NOTE — Progress Notes (Signed)
   Covid-19 Vaccination Clinic  Name:  Francella Crehan    MRN: [id number] DOB: 31-May-1966  10/11/2019  Ms. Pinks was observed post Covid-19 immunization for 30 minutes based on pre-vaccination screening without incident. She was provided with Vaccine Information Sheet and instruction to access the V-Safe system.   Ms. Verret was instructed to call 911 with any severe reactions post vaccine: Marland Kitchen Difficulty breathing  . Swelling of face and throat  . A fast heartbeat  . A bad rash all over body  . Dizziness and weakness   Immunizations Administered    Name Date Dose VIS Date Route   Pfizer COVID-19 Vaccine 10/11/2019  8:27 AM 0.3 mL 07/13/2019 Intramuscular   Manufacturer: [place]   Lot: [id number]   [place]: [id number]

## 2019-10-15 NOTE — Progress Notes (Signed)
Virtual Visit via Telephone Note   This visit type was conducted due to national recommendations for restrictions regarding the COVID-19 Pandemic (e.g. social distancing) in an effort to limit this patient's exposure and mitigate transmission in our community.  Due to her co-morbid illnesses, this patient is at least at moderate risk for complications without adequate follow up.  This format is felt to be most appropriate for this patient at this time.  The patient did not have access to video technology/had technical difficulties with video requiring transitioning to audio format only (telephone).  All issues noted in this document were discussed and addressed.  No physical exam could be performed with this format.  Please refer to the patient's chart for her  consent to telehealth for [facility].   The patient was identified using 2 identifiers.  Date:  10/17/2019   ID:  [name] [name], DOB 10-24-1965, MRN [id number]  Patient Location: Home Provider Location: Home  PCP:  Janus Molder, NP  Cardiologist:  No primary care provider on file.  Electrophysiologist:  None   Evaluation Performed:  Follow-Up Visit  Chief Complaint:  Follow up  History of Present Illness:    [name] [name] is a 54 y.o. female with a hx of obesity, asthma, hypertension and family h/o heart disease.  Initially referred to Dr. [name] by her husband, Dr.J. 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   Over the last 5 to 8 years she has developed increased weight, subcutaneous lipoma, sleep apnea, and most recently noted lower extremity swelling.  She apparently self diagnosed Dercum's disease/syndrome and was, per last office visit, attempting to get help with this at the [facility]. She reports that she was diagnosed at [place] with this disease  however has not received much assistance in treatment.  She is currently followed by her primary physician Dr. [name] and neurologist, Dr. [name] who have been helping her most with this disease.   She was last seen by Dr. [name] on 01/23/2019 for noticeable dyspnea on exertion, and intermittent episodes of chest discomfort with most of her concern due to family history of CAD in her father with aortic aneurysm and CAD and in her maternal grandfather with CAD. She was noted to previously exercise vigorously however her physical activity has worsened after the diagnosis of Dercum's disease in which she has become much less active and deconditioned.  She was concerned about the possibility of CAD.  She had prior nuclear testing that was unremarkable, performed by Dr. [name].   Dr. [name] reviewed her previously performed noncontrast chest CT which did not reveal evidence of atherosclerosis. Patient was thought to to be an ideal candidate for coronary CTA with FFR however she is allergic to contrast dye with anaphylaxis reaction therefore functional testing with Myoview stress testing was performed. Stress test performed 01/25/2019, no ST segment deviation>> considered a low risk study without ischemia.  She has lots of concerns regarding her Dercum's disease and her heart. Previously she was noted to be experiencing sudden bilateral jaw pain and some chest pain/pressure dizziness. Given CV risk factors including HLD and strong family history of premature CAD CAD/MI would prefer for her to be on preventative therapy however was previously resistant to statin?  She was last seen by myself 04/24/2019 in follow-up after an episode of chest discomfort.  At that time, she had many somatic complaints  most of which are related to her Dercum's disease (final diagnosis from [facility]). Her history is fairly complicated to follow. Additionally, she had many questions about CV risk reduction therapies including statins and  aspirin. Previous attempts to start these medications have failed due to patient's reluctant behavior.  She was very concerned that this will have some adverse effects on her Dercum's disease.    She was eventually referred to our lipid clinic on 10/08/2019 regarding CV risk reduction therapies.  She initially was tried on rosuvastatin 10 mg daily with an LDL reduction from 128-52 however she began having muscle pain. Given this the medication was stopped.  Pravastatin 20 mg was then initiated with plans for follow-up in 8 weeks with lipid panel and LFTs.  Zetia and PCSK9 inhibitors were also discussed.  Repeat lipid panel showed an LDL of 126 and triglycerides at 256. Recommendations were to limit carbohydrate intake, sugar and alcohol to keep triglycerides down.  Today [name] wanted to clarify what the plan is.  We discussed that she continue the pravastatin 20 mg daily for now and monitor for recurrent intolerances.  She has scheduled 8-week lipid panel with LFTs managed by the lipid clinic.  If she has any concerns between now and then, we discussed other options such as Zetia and PCSK9 inhibitors as described above.  Patient has no other concerns.  Agrees with plan.  The patient does not have symptoms concerning for COVID-19 infection (fever, chills, cough, or new shortness of breath).   Past Medical History:  Diagnosis Date   Anemia    many years ago   Anxiety    situational   Arthritis    "hands" (07/12/2012)   Asthma    seasonal    Chest pain    Chest pain at rest, on going 07/12/2012   Eczema    Family history of early CAD 07/12/2012   Fibromyalgia    GERD (gastroesophageal reflux disease)    Headache(784.0)    "often; not daily" (07/12/2012)   Hypertension    not on medications   Kidney stones    Migraine    Pneumonia    as a child   PONV (postoperative nausea and vomiting)    she states she gets very sick   Past Surgical History:  Procedure Laterality  Date   breast lift     CARDIAC CATHETERIZATION  07/11/2012   CHOLECYSTECTOMY N/A 09/18/2015   Procedure: LAPAROSCOPIC CHOLECYSTECTOMY;  Surgeon: Coralie Keens, MD;  Location: [place];  Service: General;  Laterality: N/A;   FOOT SURGERY  09/2019   toe surgery   LEFT HEART CATHETERIZATION WITH CORONARY ANGIOGRAM N/A 07/11/2012   Procedure: LEFT HEART CATHETERIZATION WITH CORONARY ANGIOGRAM;  Surgeon: Lorretta Harp, MD;  Location: [facility] C[facility] LAB;  Service: Cardiovascular;  Laterality: N/A;   REDUCTION MAMMAPLASTY Bilateral 10+ years ago   TONSILLECTOMY  ~ 1976   tubes and ovaries removed  2015   VAGINAL HYSTERECTOMY  ~ 2009     Current Meds  Medication Sig   ammonium lactate (AMLACTIN) 12 % lotion Apply 1 application topically as needed for dry skin.   aspirin EC 81 MG tablet Take 1 tablet (81 mg total) by mouth daily.   Brimonidine Tartrate (MIRVASO) 0.33 % GEL Mirvaso 0.33 % topical gel  APPLY TO FACE 1-2 TIMES DAILY   Ciclopirox 1 % shampoo Apply 1 mL topically 2 (two) times daily.   clobetasol (TEMOVATE) 0.05 % external solution clobetasol 0.05 % scalp solution  APPLY TO THE SCALP DAILY AS NEEDED FOR ITCH   EPINEPHrine (EPIPEN 2-PAK) 0.3 mg/0.3 mL IJ SOAJ injection Inject 0.3 mLs (0.3 mg total) into the muscle as needed.   estradiol (ESTRACE) 1 MG tablet    ketoconazole (NIZORAL) 2 % shampoo    levocetirizine (XYZAL) 5 MG tablet Take 5 mg by mouth every evening.   metroNIDAZOLE (METROGEL) 0.75 % gel metronidazole 0.75 % topical gel  APPLY ON THE SKIN DAILY FOR ROSACEA   Multiple Vitamins-Calcium (ONE-A-DAY WOMENS PO) Take 1 tablet by mouth daily.   olmesartan (BENICAR) 20 MG tablet Take 20 mg by mouth daily.   ondansetron (ZOFRAN ODT) 4 MG disintegrating tablet Take 1 tablet (4 mg total) by mouth every 8 (eight) hours as needed for nausea or vomiting.   pravastatin (PRAVACHOL) 20 MG tablet Take 1 tablet (20 mg total) by mouth every evening.   RABEprazole  (ACIPHEX) 20 MG tablet Take 20 mg by mouth 2 (two) times daily.   rizatriptan (MAXALT-MLT) 10 MG disintegrating tablet Take 10 mg by mouth every 2 (two) hours as needed for migraine.    Vitamin D, Ergocalciferol, (DRISDOL) 50000 units CAPS capsule Take 50,000 Units by mouth every Wednesday.      Allergies:   Betadine [povidone iodine], Contrast media [iodinated diagnostic agents], Morphine and related, Prednisone, Yellow jacket venom [bee venom], Azithromycin, Erythromycin, Iohexol, Oxycontin [oxycodone], Avelox [moxifloxacin hcl in nacl], Codeine, Gadolinium derivatives, Iodine, Levaquin [levofloxacin in d5w], Oxycodone-acetaminophen, Pantoprazole, Shellfish allergy, Augmentin [amoxicillin-pot clavulanate], Dilaudid [hydromorphone hcl], Oxycodone hcl, and Tramadol   Social History   Tobacco Use   Smoking status: Never Smoker   Smokeless tobacco: Never Used  Substance Use Topics   Alcohol use: No   Drug use: No     Family Hx: The patient's family history includes Allergic rhinitis in her brother, maternal aunt, and mother; Chronic Renal Failure in her paternal grandmother; Depression in her mother; Drug abuse in her son; Heart disease in her father; Hiatal hernia in her mother; Hypertension in her mother; Stroke in her maternal grandfather.  ROS:   Please see the history of present illness.     All other systems reviewed and are negative.  Prior CV studies:   The following studies were reviewed today:  None   Labs/Other Tests and Data Reviewed:    EKG:  No ECG reviewed.  Recent Labs: 11/01/2018: BUN 14; Creatinine, Ser 0.91; Potassium 3.6; Sodium 141 05/18/2019: Hemoglobin 13.7; Platelets 287 06/20/2019: ALT 12   Recent Lipid Panel Lab Results  Component Value Date/Time   CHOL 236 (H) 10/08/2019 02:25 PM   TRIG 256 (H) 10/08/2019 02:25 PM   HDL 65 10/08/2019 02:25 PM   CHOLHDL 3.6 10/08/2019 02:25 PM   LDLCALC 126 (H) 10/08/2019 02:25 PM    Wt Readings from Last 3  Encounters:  10/17/19 219 lb (99.3 kg)  05/17/19 219 lb 12 oz (99.7 kg)  04/24/19 223 lb 12.8 oz (101.5 kg)     Objective:    Vital Signs:  Ht 5\' 5"  (1.651 m)    Wt 219 lb (99.3 kg)    BMI 36.44 kg/m    VITAL SIGNS:  reviewed GEN:  no acute distress NEURO:  alert and oriented x 3, no obvious focal deficit PSYCH:  normal affect  ASSESSMENT & PLAN:    1.  Essential hypertension: -Underwent echocardiogram that per no review, was suggestive of very mild concentric hypertrophy versus LV thickening therefore consideration of possible cardiac MRI per Dr. Thompson Caul notes -  No readings available for review -Not a current active issue, did not discuss  3.  Familial hypercholesterolemia: -Significant hyperlipidemia with a total cholesterol 233 with an LDL of 128 and triglycerides at 208. -Statin previously discussed however deferred for quite some time.  She did start Crestor after referral to our lipid clinic and reported intolerances.  She was then recently started on pravastatin 20 mg 2 days ago.  She has no side effects as of yet.  We did discuss other options including Zetia and PCSK9 inhibitors if she has side effects with pravastatin.   -Plan for repeat lipid panel/LFTs in 8 weeks  -Continue to follow with lipid clinic  4.  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 Lipomas are primarily located on the trunk region and all extremities close to the trunk. Unlike ordinary lipomas associated with Dercums disease are quite painful but can be severe at times and almost debilitating. Treatment appears to be weight reduction, surgery for the most painful lipomas and medication to control pain. -Neurology is assisting to find a local specialist -Noncontrast chest CT from 12/29/2018 with no notations of lipoma involvement in the mediastinum or cardiovascular region -There was a 4  mm subpleural lung nodule>> with follow-up recommended at 12 months if low risk  COVID-19 Education: The signs and symptoms of COVID-19 were discussed with the patient and how to seek care for testing (follow up with PCP or arrange E-visit). The importance of social distancing was discussed today.  Time:   Today, I have spent 20 minutes with the patient with telehealth technology discussing the above problems.     Medication Adjustments/Labs and Tests Ordered: Current medicines are reviewed at length with the patient today.  Concerns regarding medicines are outlined above.   Tests Ordered: No orders of the defined types were placed in this encounter.   Medication Changes: No orders of the defined types were placed in this encounter.   Follow Up:  Either In Person or Virtual Dr. [name] in 1 year   Signed, Kathyrn Drown, NP  10/17/2019 12:12 PM    [place]

## 2019-10-16 ENCOUNTER — Other Ambulatory Visit: Payer: Self-pay

## 2019-10-16 ENCOUNTER — Ambulatory Visit (INDEPENDENT_AMBULATORY_CARE_PROVIDER_SITE_OTHER): Payer: PRIVATE HEALTH INSURANCE

## 2019-10-16 DIAGNOSIS — Z91030 Bee allergy status: Secondary | ICD-10-CM

## 2019-10-16 DIAGNOSIS — T7840XD Allergy, unspecified, subsequent encounter: Secondary | ICD-10-CM

## 2019-10-16 NOTE — Progress Notes (Signed)
Immunotherapy   Patient Details  Name: [name] [name] MRN: [id number] Date of Birth: [birth year]  10/16/2019  [name] started injections for  Mixed Vespid hymenoptera venom  Frequency:1 time per week Epi-Pen:Epi-Pen Available   Patient given 10 mg cetirizine 20 minutes prior to injection. She waited 30 minutes in office post injection. Patient had no local or systemic reactions upon leaving.  Consent signed and patient instructions given.   [name] 10/16/2019, 11:01 AM

## 2019-10-17 ENCOUNTER — Telehealth (INDEPENDENT_AMBULATORY_CARE_PROVIDER_SITE_OTHER): Payer: PRIVATE HEALTH INSURANCE | Admitting: Cardiology

## 2019-10-17 ENCOUNTER — Encounter: Payer: Self-pay | Admitting: Cardiology

## 2019-10-17 VITALS — Ht 65.0 in | Wt 219.0 lb

## 2019-10-17 DIAGNOSIS — E7801 Familial hypercholesterolemia: Secondary | ICD-10-CM | POA: Diagnosis not present

## 2019-10-17 DIAGNOSIS — E782 Mixed hyperlipidemia: Secondary | ICD-10-CM

## 2019-10-17 DIAGNOSIS — E882 Lipomatosis, not elsewhere classified: Secondary | ICD-10-CM

## 2019-10-17 DIAGNOSIS — I10 Essential (primary) hypertension: Secondary | ICD-10-CM

## 2019-10-19 ENCOUNTER — Other Ambulatory Visit: Payer: Self-pay

## 2019-10-19 ENCOUNTER — Ambulatory Visit (INDEPENDENT_AMBULATORY_CARE_PROVIDER_SITE_OTHER): Payer: PRIVATE HEALTH INSURANCE | Admitting: Podiatry

## 2019-10-19 DIAGNOSIS — L600 Ingrowing nail: Secondary | ICD-10-CM

## 2019-10-19 DIAGNOSIS — M79672 Pain in left foot: Secondary | ICD-10-CM

## 2019-10-19 DIAGNOSIS — L03032 Cellulitis of left toe: Secondary | ICD-10-CM | POA: Diagnosis not present

## 2019-10-22 ENCOUNTER — Encounter: Payer: Self-pay | Admitting: Podiatry

## 2019-10-22 NOTE — Progress Notes (Signed)
Subjective:  Patient ID: [name] [name], female    DOB: March 12, 1966,  MRN: [id number]  Chief Complaint  Patient presents with  . Foot Pain    pt is here for a f/u of ingrown toenail of the left big toenail, pt is concerned that she is not getting better,     54 y.o. female presents with the above complaint.  Patient presents with follow-up of left great toenail medial border pain.  Patient states the pain is about the same has not gotten any better.  Patient is concerned is not healing.  She would like more information on the chemical that was used there is still causing some redness around it.  It is not appear to be very infectious.  The procedure was done by Dr. Paulla Dolly.  Patient has not been soaking it in Epsom salt patient states that she is concerned that her pain will not get any better.  She denies any other acute complaints.   Review of Systems: Negative except as noted in the HPI. Denies N/V/F/Ch.  Past Medical History:  Diagnosis Date  . Anemia    many years ago  . Anxiety    situational  . Arthritis    "hands" (07/12/2012)  . Asthma    seasonal   . Chest pain   . Chest pain at rest, on going 07/12/2012  . Eczema   . Family history of early CAD 07/12/2012  . Fibromyalgia   . GERD (gastroesophageal reflux disease)   . Headache(784.0)    "often; not daily" (07/12/2012)  . Hypertension    not on medications  . Kidney stones   . Migraine   . Pneumonia    as a child  . PONV (postoperative nausea and vomiting)    she states she gets very sick    Current Outpatient Medications:  .  ammonium lactate (AMLACTIN) 12 % lotion, Apply 1 application topically as needed for dry skin., Disp: 400 g, Rfl: 0 .  aspirin EC 81 MG tablet, Take 1 tablet (81 mg total) by mouth daily., Disp: 90 tablet, Rfl: 3 .  Brimonidine Tartrate (MIRVASO) 0.33 % GEL, Mirvaso 0.33 % topical gel  APPLY TO FACE 1-2 TIMES DAILY, Disp: , Rfl:  .  Ciclopirox 1 % shampoo, Apply 1 mL topically 2  (two) times daily., Disp: 120 mL, Rfl: 0 .  clobetasol (TEMOVATE) 0.05 % external solution, clobetasol 0.05 % scalp solution  APPLY TO THE SCALP DAILY AS NEEDED FOR ITCH, Disp: , Rfl:  .  EPINEPHrine (EPIPEN 2-PAK) 0.3 mg/0.3 mL IJ SOAJ injection, Inject 0.3 mLs (0.3 mg total) into the muscle as needed., Disp: 2 each, Rfl: 2 .  estradiol (ESTRACE) 1 MG tablet, , Disp: , Rfl:  .  ketoconazole (NIZORAL) 2 % shampoo, , Disp: , Rfl:  .  levocetirizine (XYZAL) 5 MG tablet, Take 5 mg by mouth every evening., Disp: , Rfl:  .  metroNIDAZOLE (METROGEL) 0.75 % gel, metronidazole 0.75 % topical gel  APPLY ON THE SKIN DAILY FOR ROSACEA, Disp: , Rfl:  .  Multiple Vitamins-Calcium (ONE-A-DAY WOMENS PO), Take 1 tablet by mouth daily., Disp: , Rfl:  .  olmesartan (BENICAR) 20 MG tablet, Take 20 mg by mouth daily., Disp: , Rfl:  .  ondansetron (ZOFRAN ODT) 4 MG disintegrating tablet, Take 1 tablet (4 mg total) by mouth every 8 (eight) hours as needed for nausea or vomiting., Disp: 6 tablet, Rfl: 0 .  pravastatin (PRAVACHOL) 20 MG tablet, Take 1 tablet (20  mg total) by mouth every evening., Disp: 30 tablet, Rfl: 3 .  RABEprazole (ACIPHEX) 20 MG tablet, Take 20 mg by mouth 2 (two) times daily., Disp: , Rfl:  .  rizatriptan (MAXALT-MLT) 10 MG disintegrating tablet, Take 10 mg by mouth every 2 (two) hours as needed for migraine. , Disp: , Rfl: 11 .  Vitamin D, Ergocalciferol, (DRISDOL) 50000 units CAPS capsule, Take 50,000 Units by mouth every Wednesday. , Disp: , Rfl:   Social History   Tobacco Use  Smoking Status Never Smoker  Smokeless Tobacco Never Used    Allergies  Allergen Reactions  . Betadine [Povidone Iodine]     Throat swelling, itchy lips, redness/swelling at application site.  . Contrast Media [Iodinated Diagnostic Agents] Anaphylaxis  . Morphine And Related Anaphylaxis and Nausea And Vomiting  . Prednisone Anaphylaxis  . Yellow Jacket Venom [Bee Venom] Anaphylaxis  . Azithromycin Other (See  Comments)    SEVERE STOMACH PAIN   . Erythromycin Nausea And Vomiting and Other (See Comments)    SEVERE STOMACH PAIN/abdominal pain   . Iohexol Hives, Itching and Swelling    Swelling of upper lip, thick tongue, hives on face and back and itching all over  . Oxycontin [Oxycodone] Nausea And Vomiting  . Avelox [Moxifloxacin Hcl In Nacl] Swelling  . Codeine Nausea And Vomiting and Other (See Comments)    Headaches also  . Gadolinium Derivatives Hives, Itching and Swelling  . Iodine   . Levaquin [Levofloxacin In D5w] Swelling  . Oxycodone-Acetaminophen Nausea And Vomiting  . Pantoprazole Other (See Comments)    "Feels like I have the flu"  . Shellfish Allergy   . Augmentin [Amoxicillin-Pot Clavulanate] Nausea And Vomiting  . Dilaudid [Hydromorphone Hcl] Nausea And Vomiting  . Oxycodone Hcl Nausea And Vomiting  . Tramadol Nausea Only   Objective:  There were no vitals filed for this visit. There is no height or weight on file to calculate BMI. Constitutional Well developed. Well nourished.  Vascular Dorsalis pedis pulses palpable bilaterally. Posterior tibial pulses palpable bilaterally. Capillary refill normal to all digits.  No cyanosis or clubbing noted. Pedal hair growth normal.  Neurologic Normal speech. Oriented to person, place, and time. Epicritic sensation to light touch grossly present bilaterally.  Dermatologic Painful ingrowing nail at medial nail borders of the hallux nail left status post nail avulsion of the medial corner.  There is some redness associated with it.  The redness appears more as chemical burn as opposed to infectious No other open wounds. No skin lesions.  Orthopedic: Normal joint ROM without pain or crepitus bilaterally. No visible deformities. No bony tenderness.   Radiographs: None Assessment:   1. Pain in left foot   2. Ingrown nail   3. Paronychia of great toe, left    Plan:  Patient was evaluated and treated and all questions  answered.  Ingrown Nail, left great toe medial border status post nail avulsion~ about the same -I discussed with the patient all possible etiologies that could be causing the redness around the great toe with associated medial border nail pain.  Upon evaluation there does not appear to be nail growing back in the corner.  However a couple of other possibility could be associated with phenol chemical burn versus reaction to the lidocaine/Marcaine.  Patient has extensive medical history with fibromyalgia as well.  There is a component of fibromyalgia that could be causing the pain out of proportion of the surgery.  I did explain to the patient that if  this is likely due to chemical burn and will resolve over the next couple of months.  Patient states understanding. -Patient is also concerned with a little bit of discoloration on the nail as well.  I believe this could likely be due to fungus itself.  Patient would like to try topical medication Penlac.  I have dispensed Penlac to the pharmacy. -Patient's dryness has gotten better.  AmLactin lotion has been helping -Patient has tried shoe gear modification which is helping a little bit however it is still causing pain.  Patient states that she will try to get new balance sneakers with a wider toe box. -A total of 25 minutes was spent in direct patient care as well as pre and post patient encounter activities.  This includes documentation as well as reviewing patient chart for labs, imaging, past medical, surgical, social, and family history as documented in the EMR.  I have reviewed medication allergies as documented in EMR.  I discussed the etiology of condition and treatment options from conservative to surgical care.  All risks and benefit of the treatment course was discussed in detail.  All questions were answered and return appointment was discussed.  Since the visit completed in an ambulatory/outpatient setting, the patient and/or parent/guardian has  been advised to contact the providers office for worsening condition and seek medical treatment and/or call 911 if the patient deems either is necessary.  No follow-ups on file.

## 2019-10-23 ENCOUNTER — Ambulatory Visit: Payer: Self-pay

## 2019-10-23 ENCOUNTER — Telehealth: Payer: Self-pay | Admitting: Podiatry

## 2019-10-23 MED ORDER — CICLOPIROX 8 % EX SOLN: Freq: Every day | CUTANEOUS | 5 refills | Status: DC

## 2019-10-23 NOTE — Telephone Encounter (Signed)
I informed pt the Penlac Nail lacquer had been sent to the pharmacy [facility] [phone].

## 2019-10-23 NOTE — Addendum Note (Signed)
Addended by: Harriett Sine D on: 10/23/2019 03:48 PM   Modules accepted: Orders

## 2019-10-23 NOTE — Telephone Encounter (Signed)
Patient said Dr. Posey Pronto, was suppose to send over cream to Pharmacy for her and the only thing they had was shampoo.

## 2019-10-24 ENCOUNTER — Ambulatory Visit (INDEPENDENT_AMBULATORY_CARE_PROVIDER_SITE_OTHER): Payer: PRIVATE HEALTH INSURANCE

## 2019-10-24 ENCOUNTER — Other Ambulatory Visit: Payer: Self-pay

## 2019-10-24 DIAGNOSIS — Z91038 Other insect allergy status: Secondary | ICD-10-CM

## 2019-10-24 DIAGNOSIS — Z91030 Bee allergy status: Secondary | ICD-10-CM | POA: Diagnosis not present

## 2019-10-26 ENCOUNTER — Ambulatory Visit: Payer: PRIVATE HEALTH INSURANCE | Admitting: Podiatry

## 2019-10-27 ENCOUNTER — Ambulatory Visit: Payer: PRIVATE HEALTH INSURANCE

## 2019-10-31 ENCOUNTER — Other Ambulatory Visit: Payer: Self-pay

## 2019-10-31 ENCOUNTER — Telehealth: Payer: Self-pay

## 2019-10-31 NOTE — Telephone Encounter (Signed)
Please have her start over and use schedule A allergy immunotherapy for her. Make sure she premedicates with a long acting antihistamine. She is taking Xyzal 5mg  at night. She can take an additional Claritin, zyrtec, allegra if she tolerates those meds the morning of her injections. Patient has multiple allergic reactions to drugs, medications and we need to do the build up safely.  Thank you.

## 2019-10-31 NOTE — Telephone Encounter (Signed)
Please make sure that we are starting with the mixed vespid 0.0003 dose instead of the usual 0.003 dose given her history of allergic reactions.

## 2019-10-31 NOTE — Telephone Encounter (Signed)
Patient came to get her 3rd Mixed Vespid injection. She told me prior to getting her injection today that about 45 minutes following the last injection she had(0.0003 mcg/mL strength 0.10cc) she began having tightness in her throat and her lips were tingling. She told me that she took antihistamine and the symptoms got better in about 30 minutes. Patient did not know that we had an on-call physician and she informed me that her husband is an ER physician. I explained that the symptoms she was reporting she experienced were mild systemic symptoms. I explained that it would be best to receive her injection next week. She then said that she should not have even said anything because she had already taken her premedication (Benadryl). I did let her know that anytime something like this happens to call us, even if it is the next day, so that we can make a note.

## 2019-10-31 NOTE — Telephone Encounter (Signed)
Left message for patient to call back. I am going to add a note to the flowsheet.

## 2019-11-01 NOTE — Telephone Encounter (Signed)
Patient's voicemail is full so I am unable to leave a message.

## 2019-11-05 ENCOUNTER — Ambulatory Visit: Payer: PRIVATE HEALTH INSURANCE | Attending: Internal Medicine

## 2019-11-05 DIAGNOSIS — Z23 Encounter for immunization: Secondary | ICD-10-CM

## 2019-11-05 NOTE — Telephone Encounter (Signed)
Patient returned call to the office.  Informed patient of new dosing schedule and medication to take the night before and the morning of venom immunotherapy per Dr. Maudie Mercury.  Patient did receive her Covid vaccine this morning and has been informed she needs to wait 48 hours after vaccine and with no issues before receiving venom injection.  Patient will call the office if she has any issues later today or tomorrow and reschedule venom injection.  Patient voiced understanding of slower build up schedule and will continue to take Xyzal 5 mg at night and will take Allegra the morning of injection.

## 2019-11-05 NOTE — Progress Notes (Signed)
   Covid-19 Vaccination Clinic  Name:  [name] [name]    MRN: [id number] DOB: 1966-02-13  11/05/2019  [name] [name] was observed post Covid-19 immunization for 30 min .  During the observation period, she experienced an adverse reaction with the following symptoms:  dizziness and Flush and tingling around lips.  Assessment : Time of assessment 9:05 am. Alert and oriented, Clammy and noticed tingling aroung lips. . There were no complaint of nausea or pain. Patient was aware and prepared for possible reaction.  Actions taken:  EMS on site and hand off completed to Paramedic (Name Myrtie Cruise). VAERS form completed  There were no vitals filed for this visit.  Medications administered: Diphenhydramine (Benadryl) 25 mg capsule, by mouth. Time: 9:05am. Administered by the patient, she took her own benadryl.  Patient stated she had taken pecid and Xyzal [facility]  Disposition: Reports no further symptoms of adverse reaction after observation for 50 minutes. Discharged home.  Family member in attendance.   Immunizations Administered    Name Date Dose VIS Date Route   Pfizer COVID-19 Vaccine 11/05/2019  8:46 AM 0.3 mL 07/13/2019 Intramuscular   Manufacturer: [place]   Lot: [id number]   [place]: [id number]

## 2019-11-06 ENCOUNTER — Telehealth: Payer: Self-pay | Admitting: *Deleted

## 2019-11-06 NOTE — Telephone Encounter (Signed)
Called and spoke with patient regarding her COVID vaccine that she received yesterday. Patient stated that her symptoms had resided before she left the facility and that she did not have any more symptoms. She stated that this was her second COVID vaccine and that she had the symptoms for a short while after the first one too. She stated that she was feeling fine and was not having any more symptoms or residual side effects.

## 2019-11-07 ENCOUNTER — Ambulatory Visit (INDEPENDENT_AMBULATORY_CARE_PROVIDER_SITE_OTHER): Payer: PRIVATE HEALTH INSURANCE | Admitting: *Deleted

## 2019-11-07 ENCOUNTER — Other Ambulatory Visit: Payer: Self-pay

## 2019-11-07 DIAGNOSIS — Z91038 Other insect allergy status: Secondary | ICD-10-CM

## 2019-11-07 DIAGNOSIS — Z91030 Bee allergy status: Secondary | ICD-10-CM | POA: Diagnosis not present

## 2019-11-14 ENCOUNTER — Encounter: Payer: Self-pay | Admitting: Podiatry

## 2019-11-14 ENCOUNTER — Ambulatory Visit (INDEPENDENT_AMBULATORY_CARE_PROVIDER_SITE_OTHER): Payer: PRIVATE HEALTH INSURANCE

## 2019-11-14 ENCOUNTER — Ambulatory Visit (INDEPENDENT_AMBULATORY_CARE_PROVIDER_SITE_OTHER): Payer: PRIVATE HEALTH INSURANCE | Admitting: *Deleted

## 2019-11-14 ENCOUNTER — Ambulatory Visit (INDEPENDENT_AMBULATORY_CARE_PROVIDER_SITE_OTHER): Payer: PRIVATE HEALTH INSURANCE | Admitting: Podiatry

## 2019-11-14 ENCOUNTER — Other Ambulatory Visit: Payer: Self-pay | Admitting: Podiatry

## 2019-11-14 ENCOUNTER — Other Ambulatory Visit: Payer: Self-pay

## 2019-11-14 VITALS — Temp 98.2°F

## 2019-11-14 DIAGNOSIS — M79672 Pain in left foot: Secondary | ICD-10-CM

## 2019-11-14 DIAGNOSIS — M898X7 Other specified disorders of bone, ankle and foot: Secondary | ICD-10-CM

## 2019-11-14 DIAGNOSIS — L03032 Cellulitis of left toe: Secondary | ICD-10-CM

## 2019-11-14 DIAGNOSIS — Z91030 Bee allergy status: Secondary | ICD-10-CM

## 2019-11-14 DIAGNOSIS — Z91038 Other insect allergy status: Secondary | ICD-10-CM

## 2019-11-14 MED ORDER — DOXYCYCLINE HYCLATE 100 MG PO TABS: 100.0000 mg | ORAL_TABLET | Freq: Two times a day (BID) | ORAL | 0 refills | Status: DC

## 2019-11-14 NOTE — Progress Notes (Signed)
Subjective:   Patient ID: [name] [name], female   DOB: 54 y.o.   MRN: [id number]   HPI Patient presents stating that one side of her nail has still been bothering her and she feels like she is seeing drainage recently and some crust came off 3 days ago.  States the other site seems to be improving it seems to be only the one side   ROS      Objective:  Physical Exam  Neurovascular status found to be intact muscle strength adequate negative Bevelyn Buckles' sign noted with patient's lateral border left hallux showing some localized redness with no proximal edema erythema drainage noted no current odor or drainage emitting from the area     Assessment:  Possibility for paronychia of the left hallux with possibility that the area is closed off creating irritation     Plan:  H&P x-ray to rule out any bone changes done today I anesthetized the left hallux 60 mg like Marcaine mixture sterile prep applied to the toe and using sterile instrumentation I cleaned out the lateral border found a plug of tissue which I cleaned out I did not note any pus but I did go ahead and I did a culture of the area.  I then flushed the area applied sterile dressing and advised on soaks and start her on doxycycline twice daily for the next 15 days.  Reappoint 2 weeks or earlier if any issues were to occur  X-rays were negative for signs of osteolysis or bone reactivity

## 2019-11-14 NOTE — Patient Instructions (Signed)

## 2019-11-17 LAB — WOUND CULTURE
MICRO NUMBER:: 10362871
SPECIMEN QUALITY:: ADEQUATE

## 2019-11-20 ENCOUNTER — Ambulatory Visit: Payer: Self-pay

## 2019-11-21 ENCOUNTER — Ambulatory Visit: Payer: PRIVATE HEALTH INSURANCE | Admitting: Podiatry

## 2019-11-22 ENCOUNTER — Ambulatory Visit (INDEPENDENT_AMBULATORY_CARE_PROVIDER_SITE_OTHER): Payer: PRIVATE HEALTH INSURANCE

## 2019-11-22 ENCOUNTER — Other Ambulatory Visit: Payer: Self-pay

## 2019-11-22 DIAGNOSIS — Z91038 Other insect allergy status: Secondary | ICD-10-CM

## 2019-11-22 DIAGNOSIS — Z91030 Bee allergy status: Secondary | ICD-10-CM

## 2019-11-23 ENCOUNTER — Telehealth: Payer: Self-pay | Admitting: Cardiology

## 2019-11-28 ENCOUNTER — Telehealth: Payer: Self-pay

## 2019-11-28 NOTE — Telephone Encounter (Signed)
I called and spoke with patient. Tried to schedule her an appointment to discuss pain and swelling possibly from cholesterol medication. She is scheduled for lipid panel on 12/04/19. She wants to know if she needs to keep this lab appointment because she is concerned the labs will not show a true reading since she has been off cholesterol medication. Does she need to make an appointment to see you to discuss swelling, pain, and other cholesterol medication options?

## 2019-11-28 NOTE — Telephone Encounter (Signed)
Follow Up:    Pt called back, she was told you would call her later today.

## 2019-11-28 NOTE — Telephone Encounter (Signed)
Erroneous encounter

## 2019-11-28 NOTE — Telephone Encounter (Signed)
     I went in pt's chart to see who called her today(11-28-19)

## 2019-11-29 ENCOUNTER — Ambulatory Visit (INDEPENDENT_AMBULATORY_CARE_PROVIDER_SITE_OTHER): Payer: PRIVATE HEALTH INSURANCE | Admitting: Podiatry

## 2019-11-29 ENCOUNTER — Other Ambulatory Visit: Payer: Self-pay

## 2019-11-29 ENCOUNTER — Ambulatory Visit (INDEPENDENT_AMBULATORY_CARE_PROVIDER_SITE_OTHER): Payer: PRIVATE HEALTH INSURANCE

## 2019-11-29 ENCOUNTER — Encounter: Payer: Self-pay | Admitting: Podiatry

## 2019-11-29 DIAGNOSIS — M79672 Pain in left foot: Secondary | ICD-10-CM | POA: Diagnosis not present

## 2019-11-29 DIAGNOSIS — Z91030 Bee allergy status: Secondary | ICD-10-CM | POA: Diagnosis not present

## 2019-11-29 DIAGNOSIS — Z91038 Other insect allergy status: Secondary | ICD-10-CM

## 2019-11-29 NOTE — Telephone Encounter (Signed)
Sent mychart message to patient about lipid clinic referral after speaking with Kathyrn Drown yesterday.

## 2019-11-29 NOTE — Progress Notes (Signed)
Subjective:   Patient ID: [name] [name], female   DOB: 54 y.o.   MRN: [id number]   HPI Patient states that the side of her toe does feel better and she is not getting drainage but her toe still gets quite red at times and it is very sore and she is not able to do her normal work.  Also concerned about the color of the nailbed   ROS      Objective:  Physical Exam  Neurovascular status is intact with patient's left hallux currently at this particular juncture not showing any redness with some cracking of the middle of the nail bed localized but I was unable to elicit any redness currently even though patient states at times it appears to be red to her.  Patient is just concerned about the sensitivity of the toe and I did not note any mottled skin appearance any coolness to the toe or other indications of a pain syndrome     Assessment:  Difficult to make complete determination here but I do not see any signs of infective or acute process but I cannot rule out that there may not be some kind of a low-grade pain syndrome given the amount of discomfort she is experiencing in her daily life     Plan:  H&P reviewed condition debrided out the nail borders and smoothed the nail to try to prevent any irritation and offered her the consideration for gabapentin for her nerve ending discomfort she is experiencing.  Patient denies wanting this at this time and I did discuss if it is not improving we may need to consider that.  I do not recommend nail removal at this time and I am frustrated that she continues to experience this discomfort but I cannot currently find organic cause for this.  I am hopeful that is going to settle down on its own and not give her problems but I cannot make that guarantee to her and we will have to ascertain how it does and if it does not improve in the next 6 to 8 weeks I strongly recommend she starts medicine even though I would like to start it now but she is refusing  medication.  Patient is encouraged to let us know how this does but will get a try to give it time and see if it just gets better on its own

## 2019-11-30 ENCOUNTER — Other Ambulatory Visit: Payer: Self-pay

## 2019-11-30 DIAGNOSIS — Z131 Encounter for screening for diabetes mellitus: Secondary | ICD-10-CM

## 2019-12-03 ENCOUNTER — Ambulatory Visit (INDEPENDENT_AMBULATORY_CARE_PROVIDER_SITE_OTHER): Payer: PRIVATE HEALTH INSURANCE | Admitting: Pharmacist

## 2019-12-03 ENCOUNTER — Other Ambulatory Visit: Payer: PRIVATE HEALTH INSURANCE | Admitting: *Deleted

## 2019-12-03 ENCOUNTER — Other Ambulatory Visit: Payer: Self-pay

## 2019-12-03 ENCOUNTER — Telehealth: Payer: Self-pay | Admitting: Neurology

## 2019-12-03 ENCOUNTER — Telehealth: Payer: Self-pay

## 2019-12-03 ENCOUNTER — Encounter: Payer: Self-pay | Admitting: Neurology

## 2019-12-03 DIAGNOSIS — Z8249 Family history of ischemic heart disease and other diseases of the circulatory system: Secondary | ICD-10-CM

## 2019-12-03 DIAGNOSIS — Z131 Encounter for screening for diabetes mellitus: Secondary | ICD-10-CM

## 2019-12-03 DIAGNOSIS — E782 Mixed hyperlipidemia: Secondary | ICD-10-CM

## 2019-12-03 MED ORDER — PRAVASTATIN SODIUM 20 MG PO TABS: 20.0000 mg | ORAL_TABLET | ORAL | 11 refills | Status: DC

## 2019-12-03 NOTE — Telephone Encounter (Signed)
At this point contacting mychart help desk was a great suggestion. I also have sent the patient a message from [facility] as a test message.

## 2019-12-03 NOTE — Patient Instructions (Addendum)
It was nice to see you today  Your LDL goal is < 70  Restart your pravastatin 20mg  every other day. If you notice any pain, decrease your dose to 1/2 tablet every other day. This medication helps to lower inflammation and your LDL cholesterol  I will submit paperwork for Praluent injections. These are given every 2 weeks into the fatty tissue of your stomach. Store the medication in your fridge. Praluent lowers your LDL by 60% and will also lower your lipoprotein a by 20-30%  We will recheck your cholesterol Friday, August 6th. Come any time after 7:30am for fasting labs

## 2019-12-03 NOTE — Progress Notes (Signed)
Patient ID: [name] [name]                 DOB: 07/04/1966                    MRN: [id number]     HPI: [name] [name] is a 54 y.o. female patient referred to lipid clinic by Dr. [name]. PMH is significant for obesity, hypertension, Dercum's disease, chest pain, and elevated lipoprotein a. Last visit Sept 2020 patient had multiple questions regarding CV risk reduction therapies including aspirin and statins but was concerned how it would affect her Dercum's disease. 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 Chest CT from 12/2018 showed no lipoma involvement in mediastinum or cardiovascular region. Rosuvastatin 10mg  daily and aspirin 81mg  daily were started and LDL was reduced from 128 to 52 but patient began having muscle pain (possibly due to Dercum's but difficult to differentiate) and it was stopped. Patient tried taking the rosuvastatin once more at the beginning of March 2020 but stopped after a few weeks due to the same pain again.   I saw pt in lipid clinic in March 2021 and pt was started on pravastatin 20mg  at that time. She developed similar myalgias and stopped therapy last week. She presents today to discuss alternative lipid lowering strategies.  Patient endorsed always being very active, fit, and health conscious prior to her Dercum's diagnoses due to her extensive family history of CAD. Because of her disease, however, she has been unable to participate in regular exercise but does continue a heart healthy vegetarian/vegan diet.  Pt reports stopping pravastatin 1 week ago. She took therapy for 4 weeks but developed pain in her right upper quadrant and lower back, similar to the pain she experienced on rosuvastatin but not as severe. Her pain has improved since stopping pravastatin. She is concerned about her cholesterol due to her family  history of premature CAD. [place]. She does have GI sensitivity to medications (previously threw up for 5 hours after taking doxycycline, 23 hours after taking morphine).  Current Medications: none  Intolerances: rosuvastatin 10mg  daily (foot cramps, right calf cramp, pain in back (between should blades), pressure, tightness and pain in right side of chest, pain upper right abdomen (started dull now very sharp mostly on the right side), runny nose, anaphylaxis (sore throat, irritated inside of mouth), indigestion, pravastatin 20mg  daily (myalgias)  Risk Factors: family history (familial hypercholesterolemia, early CAD), obesity, hypertension, elevated lipoprotein a, chest pain  LDL goal: <70  Diet: vegan/vegetarian, heart healthy  Exercise: minimal due to Dercum's disease (can increase fat formation and inflammation)  Family History: family history of early CAD (heart attack in 32's and 3's), heart disease (father), hypertension (mother), maternal grandfather (stroke)  Social History: never smoked, denies drug or alcohol use  Labs:  10/08/19: TC 236, TG 256, HDL 65, LDL 126 (no LLT) 06/20/2019 TG 130, HDL 69, LDL 52 (rosuvastatin 10mg  daily), lipoprotein a 207.5  Past Medical History:  Diagnosis Date  . Anemia    many years ago  . Anxiety    situational  . Arthritis    "hands" (07/12/2012)  . Asthma    seasonal   . Chest pain   . Chest pain at rest, on going 07/12/2012  . Eczema   . Family history of  early CAD 07/12/2012  . Fibromyalgia   . GERD (gastroesophageal reflux disease)   . Headache(784.0)    "often; not daily" (07/12/2012)  . Hypertension    not on medications  . Kidney stones   . Migraine   . Pneumonia    as a child  . PONV (postoperative nausea and vomiting)    she states she gets very sick    Current Outpatient Medications on File Prior to Visit  Medication Sig Dispense Refill  . ammonium  lactate (AMLACTIN) 12 % lotion Apply 1 application topically as needed for dry skin. 400 g 0  . aspirin EC 81 MG tablet Take 1 tablet (81 mg total) by mouth daily. 90 tablet 3  . Brimonidine Tartrate (MIRVASO) 0.33 % GEL [place] 0.33 % topical gel  APPLY TO FACE 1-2 TIMES DAILY    . ciclopirox (PENLAC) 8 % solution Apply topically at bedtime. Apply over nail and surrounding skin. Apply daily over previous coat. After seven (7) days, may remove with alcohol and continue cycle. 6.6 mL 5  . clobetasol (TEMOVATE) 0.05 % external solution clobetasol 0.05 % scalp solution  APPLY TO THE SCALP DAILY AS NEEDED FOR ITCH    . doxycycline (VIBRA-TABS) 100 MG tablet Take 1 tablet (100 mg total) by mouth 2 (two) times daily. 30 tablet 0  . EPINEPHrine (EPIPEN 2-PAK) 0.3 mg/0.3 mL IJ SOAJ injection Inject 0.3 mLs (0.3 mg total) into the muscle as needed. 2 each 2  . estradiol (ESTRACE) 1 MG tablet     . ketoconazole (NIZORAL) 2 % shampoo     . levocetirizine (XYZAL) 5 MG tablet Take 5 mg by mouth every evening.    . metroNIDAZOLE (METROGEL) 0.75 % gel metronidazole 0.75 % topical gel  APPLY ON THE SKIN DAILY FOR ROSACEA    . Multiple Vitamins-Calcium (ONE-A-DAY WOMENS PO) Take 1 tablet by mouth daily.    Marland Kitchen olmesartan (BENICAR) 20 MG tablet Take 20 mg by mouth daily.    . ondansetron (ZOFRAN ODT) 4 MG disintegrating tablet Take 1 tablet (4 mg total) by mouth every 8 (eight) hours as needed for nausea or vomiting. 6 tablet 0  . pravastatin (PRAVACHOL) 20 MG tablet Take 1 tablet (20 mg total) by mouth every evening. 30 tablet 3  . RABEprazole (ACIPHEX) 20 MG tablet Take 20 mg by mouth 2 (two) times daily.    . rizatriptan (MAXALT-MLT) 10 MG disintegrating tablet Take 10 mg by mouth every 2 (two) hours as needed for migraine.   11  . Vitamin D, Ergocalciferol, (DRISDOL) 50000 units CAPS capsule Take 50,000 Units by mouth every Wednesday.      No current facility-administered medications on file prior to visit.     Allergies  Allergen Reactions  . Betadine [Povidone Iodine]     Throat swelling, itchy lips, redness/swelling at application site.  . Contrast Media [Iodinated Diagnostic Agents] Anaphylaxis  . Morphine And Related Anaphylaxis and Nausea And Vomiting  . Prednisone Anaphylaxis  . Yellow Jacket Venom [Bee Venom] Anaphylaxis  . Azithromycin Other (See Comments)    SEVERE STOMACH PAIN   . Erythromycin Nausea And Vomiting and Other (See Comments)    SEVERE STOMACH PAIN/abdominal pain   . Iohexol Hives, Itching and Swelling    Swelling of upper lip, thick tongue, hives on face and back and itching all over  . Oxycontin [Oxycodone] Nausea And Vomiting  . Avelox [Moxifloxacin Hcl In Nacl] Swelling  . Codeine Nausea And Vomiting and Other (See Comments)  Headaches also  . Gadolinium Derivatives Hives, Itching and Swelling  . Iodine   . Lamisil [Terbinafine]     Pt stated, "It made me feel like I had the flu"  . Levaquin [Levofloxacin In D5w] Swelling  . Oxycodone-Acetaminophen Nausea And Vomiting  . Pantoprazole Other (See Comments)    "Feels like I have the flu"  . Shellfish Allergy   . Augmentin [Amoxicillin-Pot Clavulanate] Nausea And Vomiting  . Dilaudid [Hydromorphone Hcl] Nausea And Vomiting  . Oxycodone Hcl Nausea And Vomiting  . Tramadol Nausea Only    Assessment/Plan:  1. Hyperlipidemia - Baseline LDL 126 above aggressive goal <70 due to family history of premature CAD and elevated Lp(a). Will rechallenge lower frequency statin pravastatin 20mg  every other day for anti-inflammatory properties and LDL lowering. Pt advised to reduce dose to 10mg  every other day if she develops intolerances on 20mg  dosing. Will also start Praluent 75mg  Q2W as this will be more effective in bringing LDL to goal and lowering Lp(a). Prior auth approved through 08/01/38. Will recheck NMR lipid panel, LFTs, CRP, and Lp(a) in 3 months.  [name] [name], PharmD, BCACP, [place] [id number] [address] [place], [place] [address] Phone: [phone]; Fax: [phone] 10/08/2019 3:22 PM

## 2019-12-03 NOTE — Telephone Encounter (Signed)
Pt returned call, the only thing I can think of is that it has been over a yr since she has had a office visit. Advised the patient that we have to see her yearly in order to order CPAP supplies and any other needs. I have scheduled the patient for a yearly follow up visit. Then we will test if she is able to message after she has had the yearly office visit.

## 2019-12-03 NOTE — Telephone Encounter (Signed)
Patient sent a reply through [facility], she wanted the 2:45PM appointment with Kathyrn Drown on 12/04/19. Appointment has been scheduled and patient aware through mychart.

## 2019-12-03 NOTE — Telephone Encounter (Signed)
Pt LVM in regards to Dr.Dohmeier not showing up in her care team list and was advised by mychart to contact neurology office as change might have been made in office

## 2019-12-03 NOTE — Progress Notes (Signed)
Cardiology Office Note   Date:  12/04/2019   ID:  [name] [name], DOB [birth year], MRN [id number]  PCP:  Janus Molder, NP  Cardiologist: Dr. [name], MD    Chief Complaint  Patient presents with  . Follow-up    History of Present Illness: [name] [name] is a 54 y.o. female who presents for medication follow up, seen for Dr. [name].  She has a hx of obesity, asthma, hypertension and family h/o heart disease.Initially referredto Dr. Lizabeth Leyden, Dr.J. 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  Over the last 5 to 8 years she has developed increased weight, subcutaneous lipoma, sleep apnea, andmost recently notedlower extremity swelling.She apparently self diagnosedDercum's disease/syndrome. She reports that she was diagnosed at [facility] with this disease however has not received much assistance in treatment. She iscurrently followed byher primary physician Dr. [name] and neurologist, Dr. Donovan Kail have been helping her most with this disease.  She saw Dr. [name] on 01/23/2019 fornoticeable dyspnea on exertion, and intermittent episodes of chest discomfort.Dr. [name] reviewed her previously performed noncontrast chest CT which did not reveal evidence of atherosclerosis. Patient was thought to to be an ideal candidate for coronary CTA with FFR however she is allergic to contrast dye with anaphylaxis reaction therefore functional testing with Myoview stress testing was performed.Stress test performed 01/25/2019, noST segment deviation>>considered a low risk study without ischemia.  She was referred to our lipid clinic on 10/08/2019 regarding CV risk reduction therapies.  She initially was tried on rosuvastatin 10 mg daily with an LDL reduction from 128-52 however she began having muscle pain. Given this the  medication was stopped.  Pravastatin 20 mg was then initiated with plans for follow-up in 8 weeks with lipid panel and LFTs.  Zetia and PCSK9 inhibitors were also discussed. Repeat lipid panel showed an LDL of 126 and triglycerides at 256. Recommendations were to limit carbohydrate intake, sugar and alcohol to keep triglycerides down.   She met with pharmacy/lipid clinic yesterday with a plan to restart Crestor 10 mg every other day with intention to start Praluent 75 mg every 2 week injection.  Prior authorization was approved therefore she will start in approximately 3 weeks.  Repeat lipid panel scheduled for approximately 3 months.  She also has concerns today for unilateral lower extremity edema which typically only occurs while riding in a car.  There is no edema on exam today.  She was recently diagnosed with lymphedema and is concerned with finding a physician in the area to help treat her.  Likely her swelling is in relation to this.  Did discuss adding Lasix 20 mg as needed however if she needs this more frequently she is to contact her office.  She denies chest pain, shortness of breath, palpitations, orthopnea, dizziness or syncope.   Past Medical History:  Diagnosis Date  . Anemia    many years ago  . Anxiety    situational  . Arthritis    "hands" (07/12/2012)  . Asthma    seasonal   . Chest pain   . Chest pain at rest, on going 07/12/2012  . Eczema   . Family history of early CAD 07/12/2012  . Fibromyalgia   . GERD (gastroesophageal reflux disease)   . Headache(784.0)    "often; not daily" (07/12/2012)  . Hypertension    not on medications  . Kidney stones   . Migraine   .  Pneumonia    as a child  . PONV (postoperative nausea and vomiting)    she states she gets very sick    Past Surgical History:  Procedure Laterality Date  . breast lift    . CARDIAC CATHETERIZATION  07/11/2012  . CHOLECYSTECTOMY N/A 09/18/2015   Procedure: LAPAROSCOPIC CHOLECYSTECTOMY;  Surgeon:  Coralie Keens, MD;  Location: [facility];  Service: General;  Laterality: N/A;  . FOOT SURGERY  09/2019   toe surgery  . LEFT HEART CATHETERIZATION WITH CORONARY ANGIOGRAM N/A 07/11/2012   Procedure: LEFT HEART CATHETERIZATION WITH CORONARY ANGIOGRAM;  Surgeon: Lorretta Harp, MD;  Location: [facility] C[facility];  Service: Cardiovascular;  Laterality: N/A;  . REDUCTION MAMMAPLASTY Bilateral 10+ years ago  . TONSILLECTOMY  ~ 1976  . tubes and ovaries removed  2015  . VAGINAL HYSTERECTOMY  ~ 2009     Current Outpatient Medications  Medication Sig Dispense Refill  . Alirocumab (PRALUENT) 75 MG/ML SOAJ Inject 1 pen into the skin every 14 (fourteen) days. 2 pen 11  . ammonium lactate (AMLACTIN) 12 % lotion Apply 1 application topically as needed for dry skin. 400 g 0  . aspirin EC 81 MG tablet Take 1 tablet (81 mg total) by mouth daily. 90 tablet 3  . Brimonidine Tartrate (MIRVASO) 0.33 % GEL Mirvaso 0.33 % topical gel  APPLY TO FACE 1-2 TIMES DAILY    . clobetasol (TEMOVATE) 0.05 % external solution clobetasol 0.05 % scalp solution  APPLY TO THE SCALP DAILY AS NEEDED FOR ITCH    . EPINEPHrine (EPIPEN 2-PAK) 0.3 mg/0.3 mL IJ SOAJ injection Inject 0.3 mLs (0.3 mg total) into the muscle as needed. 2 each 2  . estradiol (ESTRACE) 1 MG tablet     . ketoconazole (NIZORAL) 2 % shampoo     . levocetirizine (XYZAL) 5 MG tablet Take 5 mg by mouth every evening.    . metroNIDAZOLE (METROGEL) 0.75 % gel metronidazole 0.75 % topical gel  APPLY ON THE SKIN DAILY FOR ROSACEA    . Multiple Vitamins-Calcium (ONE-A-DAY WOMENS PO) Take 1 tablet by mouth daily.    Marland Kitchen olmesartan (BENICAR) 20 MG tablet Take 20 mg by mouth daily.    . ondansetron (ZOFRAN ODT) 4 MG disintegrating tablet Take 1 tablet (4 mg total) by mouth every 8 (eight) hours as needed for nausea or vomiting. 6 tablet 0  . pravastatin (PRAVACHOL) 20 MG tablet Take 1 tablet (20 mg total) by mouth every other day. 15 tablet 11  . RABEprazole (ACIPHEX) 20 MG  tablet Take 20 mg by mouth 2 (two) times daily.    . rizatriptan (MAXALT-MLT) 10 MG disintegrating tablet Take 10 mg by mouth every 2 (two) hours as needed for migraine.   11  . Vitamin D, Ergocalciferol, (DRISDOL) 50000 units CAPS capsule Take 50,000 Units by mouth every Wednesday.      No current facility-administered medications for this visit.    Allergies:   Betadine [povidone iodine], Contrast media [iodinated diagnostic agents], Morphine and related, Prednisone, Yellow jacket venom [bee venom], Azithromycin, Erythromycin, Iohexol, Oxycontin [oxycodone], Avelox [moxifloxacin hcl in nacl], Codeine, Gadolinium derivatives, Iodine, Lamisil [terbinafine], Levaquin [levofloxacin in d5w], Oxycodone-acetaminophen, Pantoprazole, Shellfish allergy, Augmentin [amoxicillin-pot clavulanate], Dilaudid [hydromorphone hcl], Oxycodone hcl, and Tramadol    Social History:  The patient  reports that she has never smoked. She has never used smokeless tobacco. She reports that she does not drink alcohol or use drugs.   Family History:  The patient's family history includes Allergic  rhinitis in her brother, maternal aunt, and mother; Chronic Renal Failure in her paternal grandmother; Depression in her mother; Drug abuse in her son; Heart disease in her father; Hiatal hernia in her mother; Hypertension in her mother; Stroke in her maternal grandfather.    ROS:  Please see the history of present illness.  Otherwise, review of systems are positive for none.   All other systems are reviewed and negative.    PHYSICAL EXAM: VS:  BP 130/82   Pulse 87   Ht '5\' 5"'$  (1.651 m)   SpO2 97%   BMI 36.44 kg/m  , BMI Body mass index is 36.44 kg/m.   General: Well developed, well nourished, NAD Neck: Negative for carotid bruits. No JVD Lungs:Clear to ausculation bilaterally. Breathing is unlabored. Cardiovascular: RRR with S1 S2. No murmurs Extremities: No edema. Radial pulses 2+ bilaterally Neuro: Alert and oriented.  No focal deficits. No facial asymmetry. MAE spontaneously. Psych: Responds to questions appropriately with normal affect.     EKG:  EKG is not ordered today.   Recent Labs: 05/18/2019: Hemoglobin 13.7; Platelets 287 12/03/2019: ALT 10   Lipid Panel    Component Value Date/Time   CHOL 220 (H) 12/03/2019 1435   TRIG 144 12/03/2019 1435   HDL 76 12/03/2019 1435   CHOLHDL 2.9 12/03/2019 1435   LDLCALC 119 (H) 12/03/2019 1435     Wt Readings from Last 3 Encounters:  10/17/19 219 lb (99.3 kg)  05/17/19 219 lb 12 oz (99.7 kg)  04/24/19 223 lb 12.8 oz (101.5 kg)     ASSESSMENT AND PLAN:  1.  Lower extremity edema: -Low suspicion for DVT.  No edema on exam today.  Likely in the setting of new diagnosis of lymphedema.  Unilateral swelling typically only occurs with prolonged sitting in the car and resolves with leg elevation.  Encouraged to continue p.m. elevation, compression socks and will add as needed Lasix 20 mg to her regimen.  If no symptom relief, to contact our office for further instruction  2. Essential hypertension: -Stable, 130/82 today with home readings reported in the 110 to 120 range -Continue current dose of Benicar  3. Familial hypercholesterolemia: -Followed closely by lipid clinic with current plans to initiate Praluent in approximately 3 weeks  -Management per lipid clinic/pharmacy  -Plan for repeat lipid panel in 3 months   4. 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 Lipomas are primarily located on the trunk region and all extremities close to the trunk. Unlike ordinary lipomas associated with Dercumsdisease are quite painful but can be severe at times and almost debilitating. Treatment appears to be weight reduction, surgery for the most painful lipomas and medication to control pain. -Neurology is assisting to find a local  specialist -Noncontrast chest CT from 12/29/2018 with no notations of lipoma involvement in the mediastinum or cardiovascular region -There was a 4 mm subpleural lung nodule>>with follow-up recommended at 12 months if low risk   Current medicines are reviewed at length with the patient today.  The patient does not have concerns regarding medicines.  The following changes have been made: Add Lasix 20 mg p.o. as needed for swelling  Labs/ tests ordered today include: None No orders of the defined types were placed in this encounter.    Disposition:   FU with Dr. [name]  in 6 months  Signed, Kathyrn Drown, NP  12/04/2019 3:09 PM  [facility] [place], [place], [place]  [address] Phone: [phone]; Fax: [phone]

## 2019-12-03 NOTE — Telephone Encounter (Signed)
Pt has asked that a message be sent to RN making her aware that she was not trying to message Dr [name] but she did notice that under her care team list of providers Dr [name] is no longer showing for her.  Pt was given the # to the my chart help desk, she just asked that this message be sent to see if there is something the RN could suggest, please call

## 2019-12-03 NOTE — Telephone Encounter (Signed)
I called and left patient a message about an appointment. [name] [name] has two openings tomorrow afternoon.

## 2019-12-04 ENCOUNTER — Other Ambulatory Visit: Payer: PRIVATE HEALTH INSURANCE

## 2019-12-04 ENCOUNTER — Ambulatory Visit: Payer: PRIVATE HEALTH INSURANCE | Admitting: Cardiology

## 2019-12-04 ENCOUNTER — Encounter: Payer: Self-pay | Admitting: Cardiology

## 2019-12-04 ENCOUNTER — Other Ambulatory Visit: Payer: Self-pay | Admitting: Pharmacist

## 2019-12-04 VITALS — BP 130/82 | HR 87 | Ht 65.0 in

## 2019-12-04 DIAGNOSIS — E882 Lipomatosis, not elsewhere classified: Secondary | ICD-10-CM

## 2019-12-04 DIAGNOSIS — I10 Essential (primary) hypertension: Secondary | ICD-10-CM | POA: Diagnosis not present

## 2019-12-04 DIAGNOSIS — I159 Secondary hypertension, unspecified: Secondary | ICD-10-CM

## 2019-12-04 DIAGNOSIS — E7801 Familial hypercholesterolemia: Secondary | ICD-10-CM | POA: Diagnosis not present

## 2019-12-04 LAB — LIPID PANEL
Chol/HDL Ratio: 2.9 ratio (ref 0.0–4.4)
Cholesterol, Total: 220 mg/dL — ABNORMAL HIGH (ref 100–199)
HDL: 76 mg/dL (ref >39)
LDL Chol Calc (NIH): 119 mg/dL — ABNORMAL HIGH (ref 0–99)
Triglycerides: 144 mg/dL (ref 0–149)
VLDL Cholesterol Cal: 25 mg/dL (ref 5–40)

## 2019-12-04 LAB — HEPATIC FUNCTION PANEL
ALT: 10 IU/L (ref 0–32)
AST: 14 IU/L (ref 0–40)
Albumin: 4.3 g/dL (ref 3.8–4.9)
Alkaline Phosphatase: 69 IU/L (ref 39–117)
Bilirubin Total: 0.3 mg/dL (ref 0.0–1.2)
Bilirubin, Direct: 0.08 mg/dL (ref 0.00–0.40)
Total Protein: 6.6 g/dL (ref 6.0–8.5)

## 2019-12-04 LAB — HEMOGLOBIN A1C
Est. average glucose Bld gHb Est-mCnc: 111 mg/dL
Hgb A1c MFr Bld: 5.5 % (ref 4.8–5.6)

## 2019-12-04 MED ORDER — PRAVASTATIN SODIUM 20 MG PO TABS: 20.0000 mg | ORAL_TABLET | ORAL | 11 refills | Status: DC

## 2019-12-04 MED ORDER — FUROSEMIDE 20 MG PO TABS
20.0000 mg | ORAL_TABLET | Freq: Every day | ORAL | 3 refills | Status: DC | PRN
Start: 2019-12-04 — End: 2020-11-17

## 2019-12-04 MED ORDER — PRALUENT 75 MG/ML ~~LOC~~ SOAJ: 1.0000 "pen " | SUBCUTANEOUS | 11 refills | Status: DC

## 2019-12-04 NOTE — Patient Instructions (Signed)
Medication Instructions:   Your physician has recommended you make the following change in your medication:   1) Start Lasix 20 mg, 1 tablet by mouth as needed for swelling   *If you need a refill on your cardiac medications before your next appointment, please call your pharmacy*  Lab Work:  None ordered today  Testing/Procedures:  None ordered today  Follow-Up: At [facility], you and your health needs are our priority.  As part of our continuing mission to provide you with exceptional heart care, we have created designated Provider Care Teams.  These Care Teams include your primary Cardiologist (physician) and Advanced Practice Providers (APPs -  Physician Assistants and Nurse Practitioners) who all work together to provide you with the care you need, when you need it.  We recommend signing up for the patient portal called "MyChart".  Sign up information is provided on this After Visit Summary.  MyChart is used to connect with patients for Virtual Visits (Telemedicine).  Patients are able to view lab/test results, encounter notes, upcoming appointments, etc.  Non-urgent messages can be sent to your provider as well.   To learn more about what you can do with MyChart, go to [email or web address].    Your next appointment:   6 month(s)  The format for your next appointment:   In Person  Provider:   Daneen Schick, MD

## 2019-12-05 ENCOUNTER — Ambulatory Visit: Payer: PRIVATE HEALTH INSURANCE

## 2019-12-05 ENCOUNTER — Ambulatory Visit: Payer: Self-pay

## 2019-12-05 ENCOUNTER — Ambulatory Visit (INDEPENDENT_AMBULATORY_CARE_PROVIDER_SITE_OTHER): Payer: PRIVATE HEALTH INSURANCE | Admitting: *Deleted

## 2019-12-05 ENCOUNTER — Ambulatory Visit: Payer: PRIVATE HEALTH INSURANCE | Admitting: Neurology

## 2019-12-05 ENCOUNTER — Encounter: Payer: Self-pay | Admitting: Neurology

## 2019-12-05 ENCOUNTER — Other Ambulatory Visit: Payer: Self-pay

## 2019-12-05 VITALS — BP 134/94 | HR 76 | Temp 97.6°F | Ht 65.0 in | Wt 219.0 lb

## 2019-12-05 DIAGNOSIS — I890 Lymphedema, not elsewhere classified: Secondary | ICD-10-CM | POA: Insufficient documentation

## 2019-12-05 DIAGNOSIS — Z91038 Other insect allergy status: Secondary | ICD-10-CM

## 2019-12-05 DIAGNOSIS — R609 Edema, unspecified: Secondary | ICD-10-CM | POA: Diagnosis not present

## 2019-12-05 DIAGNOSIS — Z91030 Bee allergy status: Secondary | ICD-10-CM

## 2019-12-05 DIAGNOSIS — G4733 Obstructive sleep apnea (adult) (pediatric): Secondary | ICD-10-CM | POA: Diagnosis not present

## 2019-12-05 NOTE — Patient Instructions (Signed)

## 2019-12-05 NOTE — Progress Notes (Signed)
SLEEP MEDICINE CLINIC   Provider:  Larey Seat, MD  Referring Provider: Janus Molder, NP Primary Care Physician:  [name] Molder, NP  Chief Complaint  Patient presents with  . Follow-up    pt alone, rm 11. pt here for yearly visit for CPAP and supplies.     HPI:  [name] [name] is a 54 y.o. female , followed by [name] Molder, NP   "She spends 80 month with a machine who didn't work" from March 2020 through January- 2021- she refused a Materials engineer.  She is unhappy with [employer].    [name] [name] on 12/05/19 at 10:30 AM EDT. Daughter lives in [place] and may have just contracted Covid- , and son just got accepted to medical school.  Early on [name] [name] has been fully vaccinated for against Covid, still have her family members husband and son, her daughter just received the first check. Daily fatigue severity scale was endorsed at 13 points, Epworth sleepiness score at five points, and the patient has reached a compliance over the last month of 63% CPAP use, average use at time of CPAP is 3 hours and 55 minutes, minimum pressure is five maximum pressure is 13 with a 3 cm EPR level residual AHI is excellent between 1.4 and 2.2 per night of use CPAP 95th percentile is between 8.9 and 9.6 so well within the range of the current settings no central apneas seem to arise,      History of Present Illness: phone visit 11-2018.  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 Retro-orbital pressure.  The patient reports that her presumed diagnosis of Dupuytren's disease has progressed and has led to hair loss.  She saw a [facility] dermatology resident physician who prescribed finasteride and a topical minoxidil for her she was only asked to return in 6 months if it did not help".  I was  under the impression that finasteride is not used in female patients.  At the same time she started finasteride she was also starting on CPAP and she reports that she developed tinnitus which is actually not uncommon as a side effect of finasteride.  14 days ago she stopped finasteride and yet her tinnitus has continued.  She also has nasal septal deviation her right nausea on is often occluded.  Her nose is stuffy every morning after using CPAP and she often wakes up with a terrible headache.  She has also had recently spikes and hypertension.  But she had not been using allergy medication for a while she is back on Xyzal and Singulair.  She has called Dr. Constance [name], ENT and she is going to see his office soon.  The benefit of CPAP therapy has been that she is by far less tired and much more energetic and that her Epworth now is endorsed at 5 out of 24 points a significant decrease.  She no longer snores and she gets about 5 hours of sleep each night.  The download shows a 87% compliance with a residual AHI of 2.5/h and a 90 percentile pressure of 7.2 cm.  Humidifier is set to 2 she is using a ramp time of 0 minutes.   Seen in a RV for 04-25-2018, I have the pleasure of seeing [name] [name] today who had become my patient originally when she was  referred for sleep study.  Her sleep study took place on 15 May 2009 and at the time revealed an AHI of only 1.1 and during REM sleep of 9.2/h.  We repeated a sleep study upon request of her primary care physician 7 years later on 07 June 2016, she again had an AHI of only 1.2 and supposing been no respiratory event related arousals.  This PSG was an attended sleep study.  In supine sleep there was slightly more apnea noted during REM sleep there was an accentuation to 6.8/h.  She did not have oxygen desaturation of clinical significance and very few periodic limb movements they did not lead to arousals.  Now she presents with similar concerns but the added  observation by her husband, Dr. Roderic [name], of snoring that he had never noticed on her before.  She feels also that her throat has been tighter and that it has been difficult to swallow at times.  She continues to have an indurated and puffy appearing skin, and her physical appearance has changed.  [name] [name] had researched her condition and had suspected that she may have been her Crohn's disease.  However specialist in that field have not seen her and she has been to some degree Ms. lead.  She made an appointment for the [facility] where she was told that only the main [place] branch does investigate this condition. She had an Discontinued hormone replacement therapy when she found information that it may make lipomas grow faster.  She slept much better while she was on hormone replacement therapy and I would like for her to resume.  To this day we do not have lipoma documented or found in any of our clinical tests but there is clearly the appearance of a thickened leathery skin, puffiness, and a change in her facial features. She developed a buffalo hump. She is in pain.   I also would like to obtain an MRI of the brain with special attention to the pituitary ruling out acromegaly.     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 She has been seen in our sleep clinic before,and exactly 7 years ago underwent a sleep study that diagnosed no apnea, no PLM and no organic disease.  Her last sleep study took place on 05/15/2009 and revealed an AHI of 1.1 even in supine there was no accentuation but in rem sleep her AHI was 9.2. She did not have oxygen desaturations at the time and snoring was noted as being mild. Heart rate varied in normal range and remained in sinus rhythm. We discussed at the time to change her bedtime to an earlier time of day and to try Flexeril as helpful for muscle relaxation and sleep induction at a low dose nightly. That was helpful for years, but not any longer. [name]'s husband, an ED  Physician, has not noted changes in her sleep, but he is de facto a shift Insurance underwriter.  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 These painful nodes are manifesting like cellulite on extremities and abdomen, and she feels misunderstood.  She is postmenopausal , enrolled into the [facility] in Spring 2016 , lost 40 pounds. Now en plateau. She still gained in girth while losing weight ! One possible explanation is that subcutaneous lipomata can be stimulated by lactic acid, therfore by exercise. She has incontinence problem.  She relates her loss of sleep to her pain all over  her body. She already underwent an MRI of the brain and the femur to look at the possibility of having developed lipomata for a central nervous system disease that could correlate to these pains. The one place she can easily sleep is in the cinema, and in the houses of friends.  07-21-2016 I have  the pleasure of seeing [name] [name] today following a sleep study from November 2017, her AHI was 1.2 and there is no apnea going on and she did not have hypoxemia she did not have periodic limb movement arousals, and she had highly efficient sleep was normal sleep architecture including normal REM sleep distribution. Her heart rate remained in normal sinus rhythm. She slept well and feels that she slept well. She had taken maxalt. She had a negative MRI for lipoma deposits, that she suspected she has.  She believes she has fibromyalgia or Dercum's disease- is in contact with Rosanne Gutting, in [place] , [place], Was recommended to do dry brushing.  Velda Young recommended Dr Chauncey Cruel. Deveshwar, Rheumatology. Dr [name] wrote her referral. paleo diet, "no red food , no dairy ".  Epworth 8 , FSS 16.  Interval history from the first of every 2018, [name] [name]  has insomnia related to pain, feels more more hard nodules affecting her extremities and torso, tender  to touch, very frustrating. She was able to lose weight but has not felt that the subcutaneous fatty tissue has really changed.  Not longer tearful.  She went skiing and had one minor fall. With  any activity now she is concerned that she may generate "too much lactic acid "and create the presumed Dercums disease to progress further.  She continues to work with Javier Docker, PT.whohas mapped the painful spots. She exercises to keep her muscle strength.  She has begun taking Cymbalta. Referral to Rheumatology, Dr. Estanislado Pandy is not seeing patients for about a month from now on- I would much appreciate if Dr. Trudie Reed or Charlynne Pander would be willing to see her. She also learned that she has borderline hypercholesterolemia but she follows a vegetarian diet, with eggs, little dairy.   Interval history from 10/21/2016, patient has not seen rheumatology at, Dr. Estanislado Pandy declined, [facility] declined, and apparently neither Dr. Lenna Gilford or Dr. Amil Amen has made appointments for her. She has seen her primary care physician yesterday but feels that she has been placed in a category that does not fit her. She does not believe that this is a psychosomatic expression or related to menopause. She has continued to see physical therapy once a week had helped in her  Myalgia and connective tissue pain- symptoms through the different modality intervention. Continues with therapy under Javier Docker.  She reports subjective right leg heaviness. Having hot flushes and cannot sleep. Hair loss. Dry mouth and dry eyes. Weight gain.  I urged her to resume estradiol and progesterone - These symptoms can be related to menopause. She eats a calorie restricted diet and is very cautious with her food intake, she has a vegetarian but not strictly vegan diet. She still has gained body fat especially at the abdomen and over the triceps. She is very unhappy about this. I offered to send her to medical weight management for second opinion. Dr.  Leafy Ro.  Interval history from 01/24/2017 [name] [name] wrote a summary of her symptoms and the development of her illness she reports that in September 2017 she begun having left knee numbness and swelling, she later developed painful lumps around the abdomen and  upper arms, the thighs,- both arms feel very heavy and she has a loss of exercise tolerance, getting winded easily.  She reports numbness swelling in her extremities extremely painful swelling in all joints especially at the hands , were it has made it difficult to hold on to an object. She feels that she is still gaining volume in the upper lower extremities and skin thickness she feels very heavy very lumpy and painful she feels that she is walking through quicksand. After sitting more than 30 minutes she will have numbing both legs and into the feet. Sometimes she has leg spasms, other times pin and needle dysesthesias. She has become larger at the abdomen 5 and upper arms in spite of being on a rigid diet is very distressing to her and remains unexplained she continues to do a lot of physical activity and watches her calories. 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  03-14-2017, Awaiting a referral acceptance from [facility].  Most recent lab results through her primary care have shown hypertriglyceridemia and hyperlipidemia, in a patient who eats a mostly vegan diet. She has gained further circumference of the upper arms, upper legs and abdomen and is very frustrated. Her fingers have also become bigger, her face more coarser. And she reports no decreased hearing in the right ear and blurring of vision at night while driving. Her MRI of October 2017 have been normal without evidence of a demyelinating  disease, but I will order a visual evoked potential and a hearing brainstem evoked potential for her now.  Review of Systems: Out of a complete 14 system review, the patient complains of only the following symptoms, and all other reviewed systems are negative.  Soreness, rigidity, nodes all over the skin.  Feeling as if muscles are "squeezed" and tight,  Pain sensitive, with menopausal symptoms, fibromyalgia ?, whole body achiness.  Tore MCL while skiing.   Social History   Socioeconomic History  . Marital status: Married    Spouse name: Not on file  . Number of children: Not on file  . Years of education: Not on file  . Highest education level: Not on file  Occupational History  . Not on file  Tobacco Use  . Smoking status: Never Smoker  . Smokeless tobacco: Never Used  Substance and Sexual Activity  . Alcohol use: No  . Drug use: No  . Sexual activity: Yes  Other Topics Concern  . Not on file  Social History Narrative  . Not on file   Social Determinants of Health   Financial Resource Strain:   . Difficulty of Paying Living Expenses:   Food Insecurity:   . Worried About Charity fundraiser in the Last Year:   . Arboriculturist in the Last Year:   Transportation Needs:   . Film/video editor (Medical):   Marland Kitchen Lack of Transportation (Non-Medical):   Physical Activity:   . Days of Exercise per Week:   . Minutes of Exercise per Session:   Stress:   . Feeling of Stress :   Social Connections:   . Frequency of Communication with Friends and Family:   . Frequency of Social Gatherings with Friends and Family:   . Attends Religious  Services:   . Active Member of Clubs or Organizations:   . Attends Archivist Meetings:   Marland Kitchen Marital Status:   Intimate Partner Violence:   . Fear of Current or Ex-Partner:   . Emotionally Abused:   Marland Kitchen Physically Abused:   . Sexually Abused:     Family History  Problem Relation Age of Onset  . Hiatal hernia Mother   .  Hypertension Mother   . Depression Mother   . Allergic rhinitis Mother   . Heart disease Father   . Stroke Maternal Grandfather   . Chronic Renal Failure Paternal Grandmother   . Drug abuse Son   . Allergic rhinitis Brother   . Allergic rhinitis Maternal Aunt     Past Medical History:  Diagnosis Date  . Anemia    many years ago  . Anxiety    situational  . Arthritis    "hands" (07/12/2012)  . Asthma    seasonal   . Chest pain   . Chest pain at rest, on going 07/12/2012  . Eczema   . Family history of early CAD 07/12/2012  . Fibromyalgia   . GERD (gastroesophageal reflux disease)   . Headache(784.0)    "often; not daily" (07/12/2012)  . Hypertension    not on medications  . Kidney stones   . Migraine   . Pneumonia    as a child  . PONV (postoperative nausea and vomiting)    she states she gets very sick    Past Surgical History:  Procedure Laterality Date  . breast lift    . CARDIAC CATHETERIZATION  07/11/2012  . CHOLECYSTECTOMY N/A 09/18/2015   Procedure: LAPAROSCOPIC CHOLECYSTECTOMY;  Surgeon: Coralie Keens, MD;  Location: [facility];  Service: General;  Laterality: N/A;  . FOOT SURGERY  09/2019   toe surgery  . LEFT HEART CATHETERIZATION WITH CORONARY ANGIOGRAM N/A 07/11/2012   Procedure: LEFT HEART CATHETERIZATION WITH CORONARY ANGIOGRAM;  Surgeon: Lorretta Harp, MD;  Location: [facility] CATH LAB;  Service: Cardiovascular;  Laterality: N/A;  . REDUCTION MAMMAPLASTY Bilateral 10+ years ago  . TONSILLECTOMY  ~ 1976  . tubes and ovaries removed  2015  . VAGINAL HYSTERECTOMY  ~ 2009    Current Outpatient Medications  Medication Sig Dispense Refill  . Alirocumab (PRALUENT) 75 MG/ML SOAJ Inject 1 pen into the skin every 14 (fourteen) days. 2 pen 11  . ammonium lactate (AMLACTIN) 12 % lotion Apply 1 application topically as needed for dry skin. 400 g 0  . aspirin EC 81 MG tablet Take 1 tablet (81 mg total) by mouth daily. 90 tablet 3  . Brimonidine Tartrate (MIRVASO)  0.33 % GEL Mirvaso 0.33 % topical gel  APPLY TO FACE 1-2 TIMES DAILY    . clobetasol (TEMOVATE) 0.05 % external solution clobetasol 0.05 % scalp solution  APPLY TO THE SCALP DAILY AS NEEDED FOR ITCH    . EPINEPHrine (EPIPEN 2-PAK) 0.3 mg/0.3 mL IJ SOAJ injection Inject 0.3 mLs (0.3 mg total) into the muscle as needed. 2 each 2  . estradiol (ESTRACE) 1 MG tablet     . furosemide (LASIX) 20 MG tablet Take 1 tablet (20 mg total) by mouth daily as needed for fluid or edema. 30 tablet 3  . ketoconazole (NIZORAL) 2 % shampoo     . levocetirizine (XYZAL) 5 MG tablet Take 5 mg by mouth every evening.    . metroNIDAZOLE (METROGEL) 0.75 % gel metronidazole 0.75 % topical gel  APPLY ON THE SKIN DAILY  FOR ROSACEA    . Multiple Vitamins-Calcium (ONE-A-DAY WOMENS PO) Take 1 tablet by mouth daily.    Marland Kitchen olmesartan (BENICAR) 20 MG tablet Take 20 mg by mouth daily.    . ondansetron (ZOFRAN ODT) 4 MG disintegrating tablet Take 1 tablet (4 mg total) by mouth every 8 (eight) hours as needed for nausea or vomiting. 6 tablet 0  . pravastatin (PRAVACHOL) 20 MG tablet Take 1 tablet (20 mg total) by mouth every other day. 15 tablet 11  . RABEprazole (ACIPHEX) 20 MG tablet Take 20 mg by mouth 2 (two) times daily.    . rizatriptan (MAXALT-MLT) 10 MG disintegrating tablet Take 10 mg by mouth every 2 (two) hours as needed for migraine.   11  . Vitamin D, Ergocalciferol, (DRISDOL) 50000 units CAPS capsule Take 50,000 Units by mouth every Wednesday.      No current facility-administered medications for this visit.    Allergies as of 12/05/2019 - Review Complete 12/05/2019  Allergen Reaction Noted  . Betadine [povidone iodine]  09/19/2019  . Contrast media [iodinated diagnostic agents] Anaphylaxis 11/01/2018  . Morphine and related Anaphylaxis and Nausea And Vomiting 09/26/2014  . Prednisone Anaphylaxis 02/24/2018  . Yellow jacket venom [bee venom] Anaphylaxis 02/24/2018  . Azithromycin Other (See Comments) 07/11/2012    . Erythromycin Nausea And Vomiting and Other (See Comments) 07/11/2012  . Iohexol Hives, Itching, and Swelling 10/08/2015  . Oxycontin [oxycodone] Nausea And Vomiting 07/25/2014  . Avelox [moxifloxacin hcl in nacl] Swelling 10/08/2015  . Codeine Nausea And Vomiting and Other (See Comments) 02/24/2018  . Gadolinium derivatives Hives, Itching, and Swelling 02/24/2018  . Iodine  08/31/2019  . Lamisil [terbinafine]  11/14/2019  . Levaquin [levofloxacin in d5w] Swelling 10/08/2015  . Oxycodone-acetaminophen Nausea And Vomiting 02/24/2018  . Pantoprazole Other (See Comments) 02/24/2018  . Shellfish allergy  08/31/2019  . Augmentin [amoxicillin-pot clavulanate] Nausea And Vomiting 09/17/2015  . Dilaudid [hydromorphone hcl] Nausea And Vomiting 09/17/2015  . Oxycodone hcl Nausea And Vomiting 09/17/2015  . Tramadol Nausea Only 09/11/2015    Vitals: BP (!) 134/94   Pulse 76   Temp 97.6 F (36.4 C)   Ht 5\' 5"  (1.651 m)   Wt 219 lb (99.3 kg)   BMI 36.44 kg/m  Last Weight:  Wt Readings from Last 1 Encounters:  12/05/19 219 lb (99.3 kg)   [id number] mass index is 36.44 kg/m.     Last Height:   Ht Readings from Last 1 Encounters:  12/05/19 5\' 5"  (1.651 m)    Physical exam:  General: The patient is awake, alert and appears not in acute distress. The patient is well groomed. Head: Normocephalic, atraumatic. Neck is supple. Mallampati 3,  neck circumference: 13.5 !  . Nasal airflow congested , macroglossia borderline . Retrognathia. Cardiovascular:  Regular rate and rhythm , without  murmurs or carotid bruit, and without distended neck veins. Respiratory: Lungs are clear to auscultation. Skin:  Without evidence of pitting edema, or rash. Dimpled skin on abdomen and hips.  .  Trunk: BMI is elevated. The patient's posture is erect   Neurologic exam : The patient is awake and alert, oriented to place and time.  Memory subjective described as intact. Attention span & concentration ability  appears normal.  Speech is fluent,  without dysarthria, dysphonia or aphasia. Mood and affect are agitated and worried.   Cranial nerves: Pupils are equal and briskly reactive to light. Extraocular movements and without nystagmus. Visual fields by finger perimetry are intact.Hearing to finger rub  intact. Facial sensation intact to fine touch. Facial motor strength is symmetric and tongue and uvula move midline. Shoulder shrug was symmetrical.    The patient's illness is still not categorized, and she  was dismissed by rheumatology.  .   -I spent more than 25 minutes of face to face time with the patient. Greater than 50% of time was spent in counseling and coordination of care.  Assessment:  After physical and neurologic examination, review of laboratory studies,  Personal review of imaging studies, reports of other /same  Imaging studies ,  Results of polysomnography/ neurophysiology testing and pre-existing records as far as provided in visit.,   1)  MicheleWilkinson's  sleep concerns are related to subjective insomnia, based on a distorted sleep perception. She has menopausal symptoms. I will order an attended sleep study for ruling out OSA- her husband has observed snoring, and possible  apnea. Reviewed the 2 previous sleep studies. Plan : New onset snoring - sleep study ordered.   2) She has extreme fatigue and insomnia, May be helped with progesterone. Resume Hormone replacement therapy.     Plan:    OSA continued treatment on CPAP with new settings has changed her degree of sleepiness.  I profoundly excused for the delayed response of the DME to replace a machine.   I regret that I have no answer for [name] [name].   RV in 12 month- keep current settings.    Asencion Partridge [name] Vincelette MD  12/05/2019   CC: [name] [name], [place] [address] [place],  [place] [id number]

## 2019-12-14 ENCOUNTER — Other Ambulatory Visit: Payer: Self-pay

## 2019-12-14 ENCOUNTER — Ambulatory Visit (INDEPENDENT_AMBULATORY_CARE_PROVIDER_SITE_OTHER): Payer: PRIVATE HEALTH INSURANCE

## 2019-12-14 DIAGNOSIS — Z91038 Other insect allergy status: Secondary | ICD-10-CM

## 2019-12-14 DIAGNOSIS — Z91030 Bee allergy status: Secondary | ICD-10-CM

## 2019-12-21 ENCOUNTER — Telehealth: Payer: Self-pay | Admitting: *Deleted

## 2019-12-21 ENCOUNTER — Ambulatory Visit: Payer: Self-pay

## 2019-12-21 ENCOUNTER — Other Ambulatory Visit: Payer: Self-pay

## 2019-12-21 ENCOUNTER — Ambulatory Visit (INDEPENDENT_AMBULATORY_CARE_PROVIDER_SITE_OTHER): Payer: PRIVATE HEALTH INSURANCE | Admitting: *Deleted

## 2019-12-21 DIAGNOSIS — Z91030 Bee allergy status: Secondary | ICD-10-CM

## 2019-12-21 DIAGNOSIS — Z91038 Other insect allergy status: Secondary | ICD-10-CM

## 2019-12-21 NOTE — Telephone Encounter (Signed)
Called and spoke with the patient to see how she was doing and she stated that she was feeling fine and was not having any issues at all. Advised to patient that if she has any issues at all to please call our on call provider over the weekend. Patient verbalized understanding.

## 2019-12-21 NOTE — Telephone Encounter (Signed)
Next injection go back to 0.50mL of the current strength (0.0038mcg/mL). Make sure she has taken her antihistamines the night before and the day of her injections prior to giving the injection.  Thank you.

## 2019-12-21 NOTE — Telephone Encounter (Signed)
Patient received .061mL in her RUA of .0072mcg/mL of Mixed Vespid today around 10:00. Within around 10 minutes she came back inside complaining of chest tightness and feeling light headed. Patient complained of itching on her hands and head and complained of her mouth feeling dry and her tongue feeling like sandpaper.  Patient was taken to a room and her vitals were taken at 10:13 and were as follows: BP 158/74, P 76, RR 18, O2 Sat 98. Patient was assessed by Gareth Morgan and was given 5mg  of Xyzal at 10:15. Vitals were taken again at 10:25 BP: 128/88, P 79, RR 18, O2 Sat 97. 20 minutes after initial reaction patient stated that she was feeling much better and was no longer having symptoms. Patient did state that she did not take her allergy medication Xyzal 5mg  prior to her injection. Advised to patient to pre medicate with Xyzal the night before and morning of her injection. Gareth Morgan FNP agreed with this plan. Patient verbalized understanding.    Dr. Maudie Mercury please advise the best course of action with the patient's next injection. Thank You.   Dr. Neldon Mc will you please call the patient tomorrow and follow up to see how she is doing on Saturday?

## 2019-12-21 NOTE — Progress Notes (Signed)
Immunotherapy   Patient Details  Name: [name] [name] MRN: [id number] Date of Birth: Feb 17, 1966  12/21/2019  Patient received .067mL in her RUA of .0038mcg/mL of Mixed Vespid today around 10:00. Within around 10 minutes she came back inside complaining of chest tightness and feeling light headed. Patient complained of itching on her hands and head and complained of her mouth feeling dry and her tongue feeling like sandpaper.  Patient was taken to a room and her vitals were taken at 10:13 and were as follows: BP 158/74, P 76, RR 18, O2 Sat 98. Patient was assessed by Gareth Morgan and was given 5mg  of Xyzal at 10:15. Vitals were taken again at 10:25 BP: 128/88, P 79, RR 18, O2 Sat 97. 20 minutes after initial reaction patient stated that she was feeling much better and was no longer having symptoms. Patient did state that she did not take her allergy medication Xyzal 5mg  prior to her injection. Advised to patient to pre medicate with Xyzal the night before and morning of her injection. Gareth Morgan FNP agreed with this plan. Patient verbalized understanding.

## 2019-12-24 NOTE — Telephone Encounter (Signed)
Flowsheet has been updated to reflect these changes. Called patient and left a voicemail asking for her to return call to discuss.

## 2019-12-25 NOTE — Telephone Encounter (Signed)
Okay to come in on 5/27.  [name] [name] - Why does the note say she got 0.035 when the flow sheet says 0.35?

## 2019-12-25 NOTE — Telephone Encounter (Signed)
Spoke with patient and was concern with her venom injections. Advise patient on recommendation from Dr. Maudie Mercury on going back to 0.22mL of the current strength (0.0080mcg/mL). She was advise to take her antihistamine the night before and hours before her injection. Patient stated she will be out of town for almost two weeks and was advise she needed to come in on Friday 12/28/2019 for her venom injection. Patient was already scheduled for 12/27/2019 prior to her reaction and wanted to know if that day was okay since she is leaving out of town for several days. Please advise?

## 2019-12-26 NOTE — Telephone Encounter (Signed)
Patient indeed received 0.78mL and not 0.071mL of MV at her last injection.

## 2019-12-26 NOTE — Telephone Encounter (Signed)
Attempted to call patient to inform, there was no answer and mailbox was full. Will need to attempt to call again.

## 2019-12-26 NOTE — Telephone Encounter (Signed)
Attempted to call patient again and was unable to get an answer or leave a voicemail. Called husband per DPR permission and spoke with him and advised that she is able to come in tomorrow for her venom injection per Dr. Maudie Mercury. Patient's husband verbalized understanding and stated that he will inform the patient.

## 2019-12-27 ENCOUNTER — Other Ambulatory Visit: Payer: Self-pay

## 2019-12-27 ENCOUNTER — Ambulatory Visit (INDEPENDENT_AMBULATORY_CARE_PROVIDER_SITE_OTHER): Payer: PRIVATE HEALTH INSURANCE

## 2019-12-27 DIAGNOSIS — Z91030 Bee allergy status: Secondary | ICD-10-CM | POA: Diagnosis not present

## 2019-12-27 DIAGNOSIS — Z91038 Other insect allergy status: Secondary | ICD-10-CM

## 2019-12-28 ENCOUNTER — Ambulatory Visit: Payer: Self-pay

## 2020-01-03 ENCOUNTER — Encounter: Payer: Self-pay | Admitting: Neurology

## 2020-01-04 ENCOUNTER — Ambulatory Visit (INDEPENDENT_AMBULATORY_CARE_PROVIDER_SITE_OTHER): Payer: PRIVATE HEALTH INSURANCE

## 2020-01-04 DIAGNOSIS — Z91030 Bee allergy status: Secondary | ICD-10-CM

## 2020-01-06 NOTE — Progress Notes (Signed)
RE: [name] [name] MRN: [id number] DOB: [birth year] Date of Telemedicine Visit: 01/07/2020  Referring provider: Janus Molder, NP Primary care provider: Janus Molder, NP  Chief Complaint: Bee Venom Injections (no new reactions since the most recent ones. ) and Allergic Reaction (doing well overall. )   Telemedicine Follow Up Visit via Telephone: I connected with [name] [name] for a follow up on 01/07/20 by telephone and verified that I am speaking with the correct person using two identifiers.   I discussed the limitations, risks, security and privacy concerns of performing an evaluation and management service by telephone and the availability of in person appointments. I also discussed with the patient that there may be a patient responsible charge related to this service. The patient expressed understanding and agreed to proceed.  Patient is at home/work.  Provider is at the office.  Visit start time: 10:10AM Visit end time: 10:30AM Insurance consent/check in by: front desk Medical consent and medical assistant/nurse: Kayla B.  History of Present Illness: She is a 54 y.o. 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 Her previous allergy office visit was on 10/03/2019 with Dr. Maudie [name] via telemedicine. Today is a regular follow up visit. Up to date with COVID-19 vaccine: yes Patient had some heart racing/flushing after Pfizer vaccine.   Allergic reaction No additional reactions besides to her venom injections.   Hymenoptera allergy Had a reaction to venom shot - 0.0090mcg/mL mixed vespid. Complained of chest tightness, light headed.  She was taking xyzal at night and nothing the morning of injection.   The last time she took antihistamine the morning of and had no issues.  No stings since the last visit.   Other allergic  rhinitis Rhinorrhea and itchy eyes. Using Xyzal daily with good benefit. Tried Atrovent with some benefit.  Needs eye drops.  Dercum disease Follows with dermatology, neurology, cardiology. Going to [facility].   Adverse food reaction Currently avoiding mollusks. Tolerates shrimp with no issues.   Assessment and Plan: [name] is a 54 y.o. 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 Bloodwork - hymenoptera panel was positive to honeybee, white faced hornet, yellow jacket, wasp, yellow hornet and bumblebee and fire ant. Shellfish panel and seafood panel were all negative. The scallop and oyster were borderline positive. Tryptase, blood count, urticaria index, and alpha gal were all normal. 2,3 Dinor-11Beta-Prostaglandin F2 Alpha, Urine - 2157pg/mg Cr (ref <5205pg/mg Cr) - normal.  Interim history - only had reaction after venom injection.   Continue xyzal 5mg  at night.  Keep track of reactions.  If you have a reaction next time, please get tryptase level drawn within 2-3 hours. This will help in identifying if it's truly an allergic reaction or some other type of adverse reaction that you are having.   For mild symptoms you can take over the counter antihistamines such as Benadryl and monitor symptoms closely. If symptoms worsen or if you have severe symptoms including breathing issues, throat closure, significant swelling, whole body hives, severe diarrhea and vomiting, lightheadedness then inject epinephrine and seek immediate medical care afterwards.  Emergency action plan in place.   Hymenoptera allergy Past history - Concern regarding hymenoptera allergy as she developed some respiratory symptoms after getting stung by a  yellow jacket. Hymenoptera panel was positive to  honeybee, white faced hornet, yellow jacket, wasp, yellow hornet and bumblebee and fire ant. Interim history - had chest tightness and lightheadedness after venom injection.   Continue mixed vespid injections (contains yellow jacket, yellow hornet and white faced hornet).  Make sure you take your allergy medication the day of injection.  If you do well with the build up then will add on honey bee and wasp together, then add on fire ant.   Carry Epipen and use for anaphylactic reactions as needed.   Other allergic rhinitis Past history - Perennial rhino conjunctivitis symptoms for many years with worsening in the fall. Patient was on AIT for 3 years but the last year developed large localized reactions so she stopped the injections. She believes they were helping her symptoms. 2020 immunocap borderline positive to few pollens. Azelastine not effective.  Interim history - rhinorrhea and itchy eyes. Wants refill on Atrovent.   Start Atrovent nasal spray - 1 to 2 sprays per nostril up to three times a day as needed for drainage.  Recommend ENT evaluation next if nasal spray does not help.   Continue xyzal 5mg  daily at night.   May use olopatadine eye drops 0.2% once a day as needed for itchy/watery eyes  Continue environmental control measures to pollen.  Multiple drug allergies Past history - Patient is sensitive to various medications. Some of the reactions are more adverse drug reactions rather then IgE mediated reactions.  Continue to avoid medications which gave her issues in the past. See allergy list.   Adverse food reaction Past history - Currently avoiding gluten and spicy foods as they seem to trigger her Dercum's disease symptoms. Shrimp caused perioral pruritus in the past. Interim history - tolerates shrimp.    Continue to avoid shellfish and mollusks. Okay with consuming shrimp.   For mild symptoms you can take over the counter antihistamines such as Benadryl and  monitor symptoms closely. If symptoms worsen or if you have severe symptoms including breathing issues, throat closure, significant swelling, whole body hives, severe diarrhea and vomiting, lightheadedness then inject epinephrine and seek immediate medical care afterwards.  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  Continue follow up with dermatology, neurology, cardiology.  [facility] appointment.   Return in about 6 months (around 07/08/2020).  Meds ordered this encounter  Medications  . Olopatadine HCl 0.2 % SOLN    Sig: Apply 1 drop to eye daily as needed (itchy/watery eyes).    Dispense:  2.5 mL    Refill:  5  . ipratropium (ATROVENT) 0.03 % nasal spray    Sig: Place 1-2 sprays into both nostrils 3 (three) times daily as needed for rhinitis.    Dispense:  30 mL    Refill:  5   Diagnostics: None.  Medication List:  Current Outpatient Medications  Medication Sig Dispense Refill  . aspirin EC 81 MG tablet Take 1 tablet (81 mg total) by mouth daily. 90 tablet 3  . clobetasol (TEMOVATE) 0.05 % external solution clobetasol 0.05 % scalp solution  APPLY TO THE SCALP DAILY AS NEEDED FOR ITCH    . EPINEPHrine (EPIPEN 2-PAK) 0.3 mg/0.3 mL IJ SOAJ injection Inject 0.3 mLs (0.3 mg total) into the muscle as needed. 2 each 2  . estradiol (ESTRACE) 1 MG tablet     . furosemide (LASIX) 20 MG tablet Take 1 tablet (20 mg total) by mouth  daily as needed for fluid or edema. 30 tablet 3  . ketoconazole (NIZORAL) 2 % shampoo     . levocetirizine (XYZAL) 5 MG tablet Take 5 mg by mouth every evening.    . metroNIDAZOLE (METROGEL) 0.75 % gel metronidazole 0.75 % topical gel  APPLY ON THE SKIN DAILY FOR ROSACEA    . Multiple Vitamins-Calcium (ONE-A-DAY WOMENS PO) Take 1 tablet by mouth daily.    Marland Kitchen olmesartan (BENICAR) 20 MG tablet Take 20 mg by mouth daily.    . ondansetron (ZOFRAN ODT) 4 MG disintegrating tablet Take 1 tablet (4  mg total) by mouth every 8 (eight) hours as needed for nausea or vomiting. 6 tablet 0  . pravastatin (PRAVACHOL) 20 MG tablet Take 1 tablet (20 mg total) by mouth every other day. 15 tablet 11  . RABEprazole (ACIPHEX) 20 MG tablet Take 20 mg by mouth 2 (two) times daily.    . rizatriptan (MAXALT-MLT) 10 MG disintegrating tablet Take 10 mg by mouth every 2 (two) hours as needed for migraine.   11  . Vitamin D, Ergocalciferol, (DRISDOL) 50000 units CAPS capsule Take 50,000 Units by mouth every Wednesday.     . Alirocumab (PRALUENT) 75 MG/ML SOAJ Inject 1 pen into the skin every 14 (fourteen) days. (Patient not taking: Reported on 01/07/2020) 2 pen 11  . ipratropium (ATROVENT) 0.03 % nasal spray Place 1-2 sprays into both nostrils 3 (three) times daily as needed for rhinitis. 30 mL 5  . Olopatadine HCl 0.2 % SOLN Apply 1 drop to eye daily as needed (itchy/watery eyes). 2.5 mL 5   No current facility-administered medications for this visit.   Allergies: Allergies  Allergen Reactions  . Betadine [Povidone Iodine]     Throat swelling, itchy lips, redness/swelling at application site.  . Contrast Media [Iodinated Diagnostic Agents] Anaphylaxis  . Morphine And Related Anaphylaxis and Nausea And Vomiting  . Prednisone Anaphylaxis  . Yellow Jacket Venom [Bee Venom] Anaphylaxis  . Azithromycin Other (See Comments)    SEVERE STOMACH PAIN   . Erythromycin Nausea And Vomiting and Other (See Comments)    SEVERE STOMACH PAIN/abdominal pain   . Iohexol Hives, Itching and Swelling    Swelling of upper lip, thick tongue, hives on face and back and itching all over  . Oxycontin [Oxycodone] Nausea And Vomiting  . Avelox [Moxifloxacin Hcl In Nacl] Swelling  . Codeine Nausea And Vomiting and Other (See Comments)    Headaches also  . Gadolinium Derivatives Hives, Itching and Swelling  . Iodine   . Lamisil [Terbinafine]     Pt stated, "It made me feel like I had the flu"  . Levaquin [Levofloxacin In D5w]  Swelling  . Oxycodone-Acetaminophen Nausea And Vomiting  . Pantoprazole Other (See Comments)    "Feels like I have the flu"  . Shellfish Allergy   . Augmentin [Amoxicillin-Pot Clavulanate] Nausea And Vomiting  . Dilaudid [Hydromorphone Hcl] Nausea And Vomiting  . Oxycodone Hcl Nausea And Vomiting  . Tramadol Nausea Only   I reviewed her past medical history, social history, family history, and environmental history and no significant changes have been reported from her previous visit.  Review of Systems  Constitutional: Negative for appetite change, chills and fever.  HENT: Positive for postnasal drip and rhinorrhea. Negative for congestion.   Eyes: Positive for itching.  Respiratory: Negative for chest tightness, shortness of breath and wheezing.   Cardiovascular: Negative for chest pain.  Gastrointestinal: Negative for abdominal pain.  Genitourinary: Negative for difficulty  urinating.  Skin: Negative for rash.  Allergic/Immunologic: Positive for environmental allergies.   Objective: Physical Exam Not obtained as encounter was done via telephone.   Previous notes and tests were reviewed.  I discussed the assessment and treatment plan with the patient. The patient was provided an opportunity to ask questions and all were answered. The patient agreed with the plan and demonstrated an understanding of the instructions. After visit summary/patient instructions available via mychart.   The patient was advised to call back or seek an in-person evaluation if the symptoms worsen or if the condition fails to improve as anticipated.  I provided 20 minutes of non-face-to-face time during this encounter.  It was my pleasure to participate in [place] Chriswell's care today. Please feel free to contact me with any questions or concerns.   Sincerely,  [name], DO Allergy & Immunology  [facility] office: [phone] [facility] office:  [place] office: [phone]

## 2020-01-07 ENCOUNTER — Other Ambulatory Visit: Payer: Self-pay

## 2020-01-07 ENCOUNTER — Ambulatory Visit (INDEPENDENT_AMBULATORY_CARE_PROVIDER_SITE_OTHER): Payer: PRIVATE HEALTH INSURANCE | Admitting: Allergy

## 2020-01-07 ENCOUNTER — Encounter: Payer: Self-pay | Admitting: Allergy

## 2020-01-07 DIAGNOSIS — Z91030 Bee allergy status: Secondary | ICD-10-CM

## 2020-01-07 DIAGNOSIS — Z91038 Other insect allergy status: Secondary | ICD-10-CM

## 2020-01-07 DIAGNOSIS — Z889 Allergy status to unspecified drugs, medicaments and biological substances status: Secondary | ICD-10-CM

## 2020-01-07 DIAGNOSIS — T781XXD Other adverse food reactions, not elsewhere classified, subsequent encounter: Secondary | ICD-10-CM

## 2020-01-07 DIAGNOSIS — E882 Lipomatosis, not elsewhere classified: Secondary | ICD-10-CM

## 2020-01-07 DIAGNOSIS — T7840XD Allergy, unspecified, subsequent encounter: Secondary | ICD-10-CM | POA: Diagnosis not present

## 2020-01-07 DIAGNOSIS — J3089 Other allergic rhinitis: Secondary | ICD-10-CM

## 2020-01-07 MED ORDER — IPRATROPIUM BROMIDE 0.03 % NA SOLN: 1.0000 | Freq: Three times a day (TID) | NASAL | 5 refills | Status: DC | PRN

## 2020-01-07 MED ORDER — OLOPATADINE HCL 0.2 % OP SOLN
1.0000 [drp] | Freq: Every day | OPHTHALMIC | 5 refills | Status: DC | PRN
Start: 2020-01-07 — End: 2021-01-05

## 2020-01-07 NOTE — Assessment & Plan Note (Signed)
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  Continue follow up with dermatology, neurology, cardiology.  [facility] appointment.

## 2020-01-07 NOTE — Patient Instructions (Addendum)
Allergic reaction  Continue xyzal 5mg  at night.  Keep track of reactions.  If you have a reaction next time, please get tryptase level drawn within 2-3 hours. This will help in identifying if it's truly an allergic reaction or some other type of adverse reaction that you are having.   For mild symptoms you can take over the counter antihistamines such as Benadryl and monitor symptoms closely. If symptoms worsen or if you have severe symptoms including breathing issues, throat closure, significant swelling, whole body hives, severe diarrhea and vomiting, lightheadedness then inject epinephrine and seek immediate medical care afterwards.  Emergency action plan in place.   Hymenoptera allergy Past history - Hymenoptera panel was positive to honeybee, white faced hornet, yellow jacket, wasp, yellow hornet and bumblebee and fire ant.  Continue avoidance.  Continue mixed vespid injections (contains yellow jacket, yellow hornet and white faced hornet).  Make sure you take your allergy medication the day of injection.  If you do well with the build up then will add on honey bee and wasp together, then add on fire ant.   Carry Epipen and use for anaphylactic reactions as needed.   Multiple drug allergies  Continue to avoid medications which gave you problems in the past.  Allergic rhino conjunctivitis  Start Atrovent nasal spray - 1 to 2 sprays per nostril up to three times a day as needed for drainage.  Recommend ENT evaluation next if nasal spray does not help.   Continue xyzal 5mg  daily at night.   Continue environmental control measures to pollen.  May use olopatadine eye drops 0.2% once a day as needed for itchy/watery eyes.  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  [facility] appointment.   Adverse food reaction  Continue to avoid shellfish and mollusks.   Okay with shrimp.   Follow up in 6 months or sooner if needed.     Sincerely,   [name] Alberts, DO Allergy & Immunology  [facility] office: [phone] [facility] office: [place] office: [phone]

## 2020-01-07 NOTE — Assessment & Plan Note (Signed)
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 Patient was on AIT for 3 years but the last year developed large localized reactions so she stopped the injections. She believes they were helping her symptoms. 2020 immunocap borderline positive to few pollens. Azelastine not effective.  Interim history - rhinorrhea and itchy eyes. Wants refill on Atrovent.   Start Atrovent nasal spray - 1 to 2 sprays per nostril up to three times a day as needed for drainage.  Recommend ENT evaluation next if nasal spray does not help.   Continue xyzal 5mg  daily at night.   May use olopatadine eye drops 0.2% once a day as needed for itchy/watery eyes  Continue environmental control measures to pollen.

## 2020-01-07 NOTE — Assessment & Plan Note (Signed)
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 Shrimp caused perioral pruritus in the past. Interim history - tolerates shrimp.    Continue to avoid shellfish and mollusks. Okay with consuming shrimp.   For mild symptoms you can take over the counter antihistamines such as Benadryl and monitor symptoms closely. If symptoms worsen or if you have severe symptoms including breathing issues, throat closure, significant swelling, whole body hives, severe diarrhea and vomiting, lightheadedness then inject epinephrine and seek immediate medical care afterwards.

## 2020-01-07 NOTE — Assessment & Plan Note (Signed)
Past history - Patient is sensitive to various medications. Some of the reactions are more adverse drug reactions rather then IgE mediated reactions.  Continue to avoid medications which gave her issues in the past. See allergy list.  

## 2020-01-07 NOTE — Assessment & Plan Note (Signed)
Past history - Concern regarding hymenoptera allergy as she developed some respiratory symptoms after getting stung by a yellow jacket. Hymenoptera panel was positive to honeybee, white faced hornet, yellow jacket, wasp, yellow hornet and bumblebee and fire ant. Interim history - had chest tightness and lightheadedness after venom injection.   Continue mixed vespid injections (contains yellow jacket, yellow hornet and white faced hornet).  Make sure you take your allergy medication the day of injection.  If you do well with the build up then will add on honey bee and wasp together, then add on fire ant.   Carry Epipen and use for anaphylactic reactions as needed.

## 2020-01-07 NOTE — Assessment & Plan Note (Signed)
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 Bloodwork - hymenoptera panel was positive to honeybee, white faced hornet, yellow jacket, wasp, yellow hornet and bumblebee and fire ant. Shellfish panel and seafood panel were all negative. The scallop and oyster were borderline positive. Tryptase, blood count, urticaria index, and alpha gal were all normal. 2,3 Dinor-11Beta-Prostaglandin F2 Alpha, Urine - 2157pg/mg Cr (ref <5205pg/mg Cr) - normal.  Interim history - only had reaction after venom injection.   Continue xyzal 5mg  at night.  Keep track of reactions.  If you have a reaction next time, please get tryptase level drawn within 2-3 hours. This will help in identifying if it's truly an allergic reaction or some other type of adverse reaction that you are having.   For mild symptoms you can take over the counter antihistamines such as Benadryl and monitor symptoms closely. If symptoms worsen or if you have severe symptoms including breathing issues, throat closure, significant swelling, whole body hives, severe diarrhea and vomiting, lightheadedness then inject epinephrine and seek immediate medical care afterwards.  Emergency action plan in place.

## 2020-01-10 ENCOUNTER — Other Ambulatory Visit: Payer: Self-pay

## 2020-01-10 ENCOUNTER — Ambulatory Visit (INDEPENDENT_AMBULATORY_CARE_PROVIDER_SITE_OTHER): Payer: PRIVATE HEALTH INSURANCE

## 2020-01-10 ENCOUNTER — Telehealth: Payer: Self-pay

## 2020-01-10 DIAGNOSIS — Z91038 Other insect allergy status: Secondary | ICD-10-CM

## 2020-01-10 DIAGNOSIS — Z91030 Bee allergy status: Secondary | ICD-10-CM

## 2020-01-10 NOTE — Telephone Encounter (Signed)
Patient received 0.4cc of Mixed Vespid 0.0003 mcg/mL this morning. Patient began complaining of eye and scalp itching. Patient was taken to a room and MD notified. Patient's vitals were stable. Per MD instructions 20 mg cetirizine syrup was given to patient. Patient's vitals remained stable and itching began to subside. Patient complained of mid-sternal chest pain. MD evaluated and gave verbal order for famotidine 40 mg. Patient was monitored for additional 30 minutes and vitals remained stable during visit.     Follow up call at 648pm went to voicemail. I left a message for her to call back the following morning.

## 2020-01-11 ENCOUNTER — Ambulatory Visit: Payer: Self-pay

## 2020-01-14 NOTE — Telephone Encounter (Signed)
Please go back to 0.3cc of mixed vespid 0.0003 mcg/mL and increase using schedule A.  Make sure she has taken her antihistamines the day of her injection.  Next time she has a reaction - please draw tryptase level in the office.  Thank you.

## 2020-01-14 NOTE — Telephone Encounter (Signed)
Immunotherapy flowsheet updated with these notes. I called patient but her voicemail was full.

## 2020-01-14 NOTE — Telephone Encounter (Signed)
Agree with that documentation by [facility]. I am not convinced that this was an IgE mediated reaction. There were no objective findings of an allergic reaction at all and in fact her symptoms improved within five minutes of giving the cetirizine which does not make a lot of sense. Regardless, we treated her with famotidine as well. We did avoid prednisone and epinephrine and she did well.  I talked with Dr. Maudie Mercury and she is going to make any necessary changes to her venom immunotherapy.   Salvatore Marvel, MD [facility] [place]

## 2020-01-17 ENCOUNTER — Ambulatory Visit: Payer: Self-pay

## 2020-01-18 ENCOUNTER — Ambulatory Visit: Payer: Self-pay

## 2020-01-21 NOTE — Telephone Encounter (Signed)
New instructions for next injection has been entered into flowsheet.

## 2020-01-21 NOTE — Telephone Encounter (Signed)
If she comes in on 6/22, please give her  0.25cc of 0.0016mcg/mL with next injection & b/u on sched A-per Dr. Maudie Mercury. Make sure patient has taken antihistamines prior to her injection.

## 2020-01-21 NOTE — Telephone Encounter (Signed)
Patient has not returned call and is scheduled for 01-22-20 for her next injection.

## 2020-01-21 NOTE — Telephone Encounter (Signed)
Patient will be 12 days late for her injection. Please advise on next injection.

## 2020-01-22 ENCOUNTER — Other Ambulatory Visit: Payer: Self-pay

## 2020-01-22 ENCOUNTER — Ambulatory Visit (INDEPENDENT_AMBULATORY_CARE_PROVIDER_SITE_OTHER): Payer: PRIVATE HEALTH INSURANCE

## 2020-01-22 DIAGNOSIS — Z91030 Bee allergy status: Secondary | ICD-10-CM

## 2020-01-22 DIAGNOSIS — Z91038 Other insect allergy status: Secondary | ICD-10-CM

## 2020-01-25 ENCOUNTER — Ambulatory Visit: Payer: Self-pay

## 2020-01-31 ENCOUNTER — Other Ambulatory Visit: Payer: Self-pay

## 2020-01-31 ENCOUNTER — Telehealth: Payer: Self-pay | Admitting: Pharmacist

## 2020-01-31 ENCOUNTER — Ambulatory Visit (INDEPENDENT_AMBULATORY_CARE_PROVIDER_SITE_OTHER): Payer: PRIVATE HEALTH INSURANCE

## 2020-01-31 DIAGNOSIS — I10 Essential (primary) hypertension: Secondary | ICD-10-CM

## 2020-01-31 DIAGNOSIS — M791 Myalgia, unspecified site: Secondary | ICD-10-CM

## 2020-01-31 DIAGNOSIS — Z91038 Other insect allergy status: Secondary | ICD-10-CM

## 2020-01-31 DIAGNOSIS — Z91030 Bee allergy status: Secondary | ICD-10-CM | POA: Diagnosis not present

## 2020-01-31 DIAGNOSIS — H9319 Tinnitus, unspecified ear: Secondary | ICD-10-CM

## 2020-01-31 MED ORDER — ROSUVASTATIN CALCIUM 5 MG PO TABS
5.0000 mg | ORAL_TABLET | ORAL | 11 refills | Status: DC
Start: 2020-01-31 — End: 2020-04-23

## 2020-01-31 NOTE — Telephone Encounter (Signed)
Referral to the tinnitus center on [address].

## 2020-01-31 NOTE — Telephone Encounter (Signed)
Called pt as she sent 2 MyChart messages requesting I call her ASAP.  She states she just gave her first Praluent injection in her right thigh and noted swelling and tingling in her whole right leg down to her foot, pain in her right arm and back, dizziness, and tightness in her throat within 30 minutes of her injection with her heart racing as well.  Reports tolerating the pravastatin 20mg  every other day well but inquires about changing back to rosuvastatin since this is more effective. Previously experienced myalgias on pravastatin 20mg  daily and rosuvastatin 10mg  daily.  Will stop Praluent, updated allergy list, stop pravastatin and start lower dose of rosuvastatin 5mg  every other day. Advised pt to call with any tolerability issues or if she plans to increase rosuvastatin to daily dosing based on tolerability as new rx would need to be sent in. Moved labs out to 10 weeks from today.

## 2020-02-01 ENCOUNTER — Ambulatory Visit: Payer: Self-pay

## 2020-02-07 ENCOUNTER — Ambulatory Visit (INDEPENDENT_AMBULATORY_CARE_PROVIDER_SITE_OTHER): Payer: PRIVATE HEALTH INSURANCE

## 2020-02-07 ENCOUNTER — Other Ambulatory Visit: Payer: Self-pay

## 2020-02-07 DIAGNOSIS — Z91030 Bee allergy status: Secondary | ICD-10-CM

## 2020-02-07 DIAGNOSIS — Z91038 Other insect allergy status: Secondary | ICD-10-CM

## 2020-02-14 ENCOUNTER — Ambulatory Visit: Payer: Self-pay

## 2020-02-20 ENCOUNTER — Other Ambulatory Visit: Payer: Self-pay

## 2020-02-20 ENCOUNTER — Ambulatory Visit (INDEPENDENT_AMBULATORY_CARE_PROVIDER_SITE_OTHER): Payer: PRIVATE HEALTH INSURANCE

## 2020-02-20 DIAGNOSIS — Z91038 Other insect allergy status: Secondary | ICD-10-CM

## 2020-02-20 DIAGNOSIS — Z91030 Bee allergy status: Secondary | ICD-10-CM | POA: Diagnosis not present

## 2020-03-07 ENCOUNTER — Ambulatory Visit: Payer: Self-pay

## 2020-03-07 ENCOUNTER — Other Ambulatory Visit: Payer: PRIVATE HEALTH INSURANCE

## 2020-03-11 ENCOUNTER — Telehealth: Payer: Self-pay | Admitting: Neurology

## 2020-03-11 NOTE — Telephone Encounter (Signed)
Hello , I tried to call you your voicemail was full .  I have your apt scheduled apt scheduled 03/20/2020 arrive at 9:00 am your apt is at 9:20 plan to be there at least one hour and thirty minutes . Address [address] there telephone number [phone] . If this apt does not work for you you can call and reschedule .  Thanks Hinton Dyer

## 2020-03-14 ENCOUNTER — Other Ambulatory Visit: Payer: Self-pay

## 2020-03-14 ENCOUNTER — Ambulatory Visit (INDEPENDENT_AMBULATORY_CARE_PROVIDER_SITE_OTHER): Payer: PRIVATE HEALTH INSURANCE | Admitting: *Deleted

## 2020-03-14 DIAGNOSIS — Z91030 Bee allergy status: Secondary | ICD-10-CM

## 2020-03-14 DIAGNOSIS — Z91038 Other insect allergy status: Secondary | ICD-10-CM

## 2020-03-15 ENCOUNTER — Encounter (HOSPITAL_COMMUNITY): Payer: Self-pay | Admitting: Emergency Medicine

## 2020-03-15 ENCOUNTER — Other Ambulatory Visit: Payer: Self-pay

## 2020-03-15 ENCOUNTER — Emergency Department (HOSPITAL_COMMUNITY)
Admission: EM | Admit: 2020-03-15 | Discharge: 2020-03-15 | Disposition: A | Discharge status: Home / Self Care | Payer: PRIVATE HEALTH INSURANCE | Attending: Emergency Medicine | Admitting: Emergency Medicine

## 2020-03-15 DIAGNOSIS — Z20822 Contact with and (suspected) exposure to covid-19: Secondary | ICD-10-CM | POA: Diagnosis not present

## 2020-03-15 DIAGNOSIS — Z79899 Other long term (current) drug therapy: Secondary | ICD-10-CM | POA: Diagnosis not present

## 2020-03-15 DIAGNOSIS — I10 Essential (primary) hypertension: Secondary | ICD-10-CM | POA: Diagnosis not present

## 2020-03-15 DIAGNOSIS — R05 Cough: Secondary | ICD-10-CM | POA: Diagnosis present

## 2020-03-15 DIAGNOSIS — Z7982 Long term (current) use of aspirin: Secondary | ICD-10-CM | POA: Insufficient documentation

## 2020-03-15 DIAGNOSIS — J45909 Unspecified asthma, uncomplicated: Secondary | ICD-10-CM | POA: Diagnosis not present

## 2020-03-15 DIAGNOSIS — J069 Acute upper respiratory infection, unspecified: Secondary | ICD-10-CM | POA: Diagnosis not present

## 2020-03-15 LAB — SARS CORONAVIRUS 2 BY RT PCR (HOSPITAL ORDER, PERFORMED IN ~~LOC~~ HOSPITAL LAB): SARS Coronavirus 2: NEGATIVE

## 2020-03-15 NOTE — ED Provider Notes (Signed)
[facility] Provider Note   CSN: [id number] Arrival date & time: 03/15/20  1356     History Chief Complaint  Patient presents with  . Cough    [name] [name] is a 54 y.o. female.  HPI      [name] [name] is a 54 y.o. female with past medical history of anemia, asthma, who presents to the Emergency Department complaining of nasal congestion, sneezing, cough, irritated, sore throat, generalized body aches and fatigue, headache and chills.  Symptoms have been present for 7 days.  She has traveled recently on an airplane and is concerned that she may has been exposed to Covid.  States that some of the people on the airplane were not wearing a mask.  She denies chest pain or shortness of breath, nausea, vomiting, or diarrhea.     Past Medical History:  Diagnosis Date  . Anemia    many years ago  . Anxiety    situational  . Arthritis    "hands" (07/12/2012)  . Asthma    seasonal   . Chest pain   . Chest pain at rest, on going 07/12/2012  . Eczema   . Family history of early CAD 07/12/2012  . Fibromyalgia   . GERD (gastroesophageal reflux disease)   . Headache(784.0)    "often; not daily" (07/12/2012)  . Hypertension    not on medications  . Kidney stones   . Migraine   . Pneumonia    as a child  . PONV (postoperative nausea and vomiting)    she states she gets very sick    Patient Active Problem List   Diagnosis Date Noted  . OSA (obstructive sleep apnea) 12/05/2019  . Lymphedema in adult patient 12/05/2019  . Bacterial vaginosis 09/19/2019  . Candidal vulvovaginitis 09/19/2019  . Dysuria 09/19/2019  . Urine finding 09/19/2019  . Vitamin D deficiency 09/19/2019  . Other allergic rhinitis 05/21/2019  . 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 Adverse food reaction 05/21/2019  . Coughing 05/21/2019  . Allergic reaction 05/17/2019  . Hymenoptera allergy 05/17/2019  . Multiple drug allergies 05/17/2019  . Upper airway cough syndrome  02/06/2019  . DOE (dyspnea on exertion) 02/06/2019  . Morbid obesity ([place]) 11/28/2018  . 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 Tinnitus aurium, unspecified laterality 11/28/2018  . New daily persistent headache 11/28/2018  . OSA on CPAP 11/28/2018  . Acquired trigger finger of left index finger 08/29/2017  . Pain in right hand 08/29/2017  . Trigger finger of right hand 08/29/2017  . Lipoedema 03/14/2017  . Blurring of vision 03/14/2017  . Class 2 obesity with body mass index (BMI) of 38.0 to 38.9 in adult 03/14/2017  . Fibromyalgia 11/09/2016  . History of migraine 11/09/2016  . History of environmental allergies 11/09/2016  . Abdominal apron 10/21/2016  . Myalgia 05/13/2016  . Adjustment insomnia 05/13/2016  . Snoring 05/13/2016  . 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 Hyperlipidemia 11/03/2014  . Pelvic pain in female 07/30/2014  . Chest pain at rest, on going, probable GI source 07/12/2012  . HTN (hypertension) 07/12/2012  . Family history of early CAD 07/12/2012  . Anxiety 07/12/2012    Past Surgical History:  Procedure Laterality Date  . breast lift    . CARDIAC CATHETERIZATION  07/11/2012  . CHOLECYSTECTOMY N/A 09/18/2015   Procedure: LAPAROSCOPIC CHOLECYSTECTOMY;  Surgeon: Coralie Keens, MD;  Location: [facility];  Service: General;  Laterality: N/A;  . FOOT SURGERY  09/2019   toe  surgery  . LEFT HEART CATHETERIZATION WITH CORONARY ANGIOGRAM N/A 07/11/2012   Procedure: LEFT HEART CATHETERIZATION WITH CORONARY ANGIOGRAM;  Surgeon: Lorretta Harp, MD;  Location: [facility] C[facility] LAB;  Service: Cardiovascular;  Laterality: N/A;  . REDUCTION MAMMAPLASTY Bilateral 10+ years ago  . TONSILLECTOMY  ~ 1976  . tubes and ovaries removed  2015  . VAGINAL HYSTERECTOMY  ~ 2009     OB History   No obstetric history on file.     Family History  Problem Relation Age of Onset  . Hiatal hernia Mother   . Hypertension Mother   . Depression Mother   . Allergic rhinitis  Mother   . Heart disease Father   . Stroke Maternal Grandfather   . Chronic Renal Failure Paternal Grandmother   . Drug abuse Son   . Allergic rhinitis Brother   . Allergic rhinitis Maternal Aunt     Social History   Tobacco Use  . Smoking status: Never Smoker  . Smokeless tobacco: Never Used  Vaping Use  . Vaping Use: Never used  Substance Use Topics  . Alcohol use: No  . Drug use: No    Home Medications Prior to Admission medications   Medication Sig Start Date End Date Taking? Authorizing Provider  aspirin EC 81 MG tablet Take 1 tablet (81 mg total) by mouth daily. 05/01/19   Belva Crome, MD  clobetasol (TEMOVATE) 0.05 % external solution clobetasol 0.05 % scalp solution  APPLY TO THE SCALP DAILY AS NEEDED FOR [facility]    [provider]  EPINEPHrine (EPIPEN 2-PAK) 0.3 mg/0.3 mL IJ SOAJ injection Inject 0.3 mLs (0.3 mg total) into the muscle as needed. 10/03/19   Garnet Sierras, DO  estradiol (ESTRACE) 1 MG tablet  04/11/19   [provider]  furosemide (LASIX) 20 MG tablet Take 1 tablet (20 mg total) by mouth daily as needed for fluid or edema. 12/04/19   Kathyrn Drown D, NP  ipratropium (ATROVENT) 0.03 % nasal spray Place 1-2 sprays into both nostrils 3 (three) times daily as needed for rhinitis. 01/07/20   Garnet Sierras, DO  ketoconazole (NIZORAL) 2 % shampoo  04/11/19   [provider]  levocetirizine (XYZAL) 5 MG tablet Take 5 mg by mouth every evening.    [provider]  metroNIDAZOLE (METROGEL) 0.75 % gel metronidazole 0.75 % topical gel  APPLY ON THE SKIN DAILY FOR ROSACEA    [provider]  Multiple Vitamins-Calcium (ONE-A-DAY WOMENS PO) Take 1 tablet by mouth daily.    [provider]  olmesartan (BENICAR) 20 MG tablet Take 20 mg by mouth daily. 11/03/18   [provider]  Olopatadine HCl 0.2 % SOLN Apply 1 drop to eye daily as needed (itchy/watery eyes). 01/07/20   Garnet Sierras, DO  ondansetron (ZOFRAN ODT) 4 MG  disintegrating tablet Take 1 tablet (4 mg total) by mouth every 8 (eight) hours as needed for nausea or vomiting. 08/19/17   Francine Graven, DO  RABEprazole (ACIPHEX) 20 MG tablet Take 20 mg by mouth 2 (two) times daily.    [provider]  rizatriptan (MAXALT-MLT) 10 MG disintegrating tablet Take 10 mg by mouth every 2 (two) hours as needed for migraine.  06/18/15   [provider]  rosuvastatin (CRESTOR) 5 MG tablet Take 1 tablet (5 mg total) by mouth every other day. 01/31/20 01/30/21  Belva Crome, MD  Vitamin D, Ergocalciferol, (DRISDOL) 50000 units CAPS capsule Take 50,000 Units by mouth every  Wednesday.     [provider]    Allergies    Betadine [povidone iodine], Contrast media [iodinated diagnostic agents], Morphine and related, Prednisone, Yellow jacket venom [bee venom], Azithromycin, Erythromycin, Iohexol, Oxycontin [oxycodone], Avelox [moxifloxacin hcl in nacl], Codeine, Gadolinium derivatives, Iodine, Lamisil [terbinafine], Levaquin [levofloxacin in d5w], Oxycodone-acetaminophen, Pantoprazole, Praluent [alirocumab], Shellfish allergy, Augmentin [amoxicillin-pot clavulanate], Dilaudid [hydromorphone hcl], Oxycodone hcl, and Tramadol  Review of Systems   Review of Systems  Constitutional: Positive for chills. Negative for appetite change and fever.  HENT: Positive for congestion, sneezing and sore throat. Negative for trouble swallowing.   Respiratory: Positive for cough and chest tightness. Negative for shortness of breath and wheezing.   Cardiovascular: Negative for chest pain.  Gastrointestinal: Negative for abdominal pain, diarrhea, nausea and vomiting.  Genitourinary: Negative for difficulty urinating and dysuria.  Musculoskeletal: Negative for arthralgias and myalgias.  Skin: Negative for rash.  Neurological: Negative for dizziness, syncope, weakness, numbness and headaches.  Hematological: Negative for adenopathy.    Physical Exam Updated Vital  Signs BP 132/79 (BP Location: Right Arm)   Pulse 93   Temp 98.4 F (36.9 C) (Oral)   Resp 18   Ht 5\' 5"  (1.651 m)   Wt 81.6 kg   SpO2 97%   BMI 29.95 kg/m   Physical Exam Vitals and nursing note reviewed.  Constitutional:      General: She is not in acute distress.    Appearance: Normal appearance. She is well-developed. She is not ill-appearing or toxic-appearing.  HENT:     Head:     Jaw: No trismus.     Right Ear: Tympanic membrane and ear canal normal.     Left Ear: Tympanic membrane and ear canal normal.     Nose: Mucosal edema present. No rhinorrhea.     Mouth/Throat:     Mouth: Mucous membranes are moist.     Pharynx: Oropharynx is clear. Uvula midline. No oropharyngeal exudate, posterior oropharyngeal erythema or uvula swelling.     Tonsils: No tonsillar abscesses.     Comments: Uvula midline and nonedematous.  No edema or erythema of the oropharynx.  No exudates. Eyes:     Conjunctiva/sclera: Conjunctivae normal.  Neck:     Trachea: Phonation normal.     Meningeal: Brudzinski's sign and Kernig's sign absent.  Cardiovascular:     Rate and Rhythm: Normal rate and regular rhythm.     Heart sounds: No murmur heard.   Pulmonary:     Effort: Pulmonary effort is normal. No respiratory distress.     Breath sounds: Normal breath sounds. No wheezing or rales.  Abdominal:     General: There is no distension.     Palpations: Abdomen is soft.     Tenderness: There is no guarding or rebound.  Musculoskeletal:        General: Normal range of motion.     Cervical back: Normal range of motion and neck supple. No rigidity.     Right lower leg: No edema.     Left lower leg: No edema.  Lymphadenopathy:     Cervical: No cervical adenopathy.  Skin:    General: Skin is warm.     Capillary Refill: Capillary refill takes less than 2 seconds.     Findings: No erythema or rash.  Neurological:     General: No focal deficit present.     Mental Status: She is alert.     Sensory:  No sensory deficit.     Motor: No  weakness or abnormal muscle tone.     Coordination: Coordination normal.     ED Results / Procedures / Treatments   Labs (all labs ordered are listed, but only abnormal results are displayed) Labs Reviewed  SARS CORONAVIRUS 2 BY RT PCR (HOSPITAL ORDER, [facility])    EKG None  Radiology No results found.  Procedures Procedures (including critical care time)  Medications Ordered in ED Medications - No data to display  ED Course  I have reviewed the triage vital signs and the nursing notes.  Pertinent labs & imaging results that were available during my care of the patient were reviewed by me and considered in my medical decision making (see chart for details).    MDM Rules/Calculators/A&P                          Patient well-appearing.  Vital signs reviewed and reassuring.  No hypoxia, tachycardia or tachypnea.  No concerning symptoms for PE.  Lung sounds are clear to auscultation bilaterally.  No indication for imaging at this time.  Patient here for concerns of possible Covid exposure.  Agreeable to Covid testing.  On recheck, patient resting comfortably.  No acute respiratory distress.  Covid PCR test is negative.  Patient reassured.  She appears appropriate for discharge home, agrees to close outpatient follow-up if needed.   Final Clinical Impression(s) / ED Diagnoses Final diagnoses:  Viral URI with cough    Rx / DC Orders ED Discharge Orders    None       Kem Parkinson, PA-C 03/15/20 1631    Milton Ferguson, MD 03/16/20 1246

## 2020-03-15 NOTE — Discharge Instructions (Addendum)
Your Covid test today was negative.  You may continue to take Tylenol every 4 hours if needed for fever or body aches.  Follow-up with your primary care provider for recheck if needed. It was our pleasure to take care of you today

## 2020-03-15 NOTE — ED Triage Notes (Signed)
Pt c/o of cough, congestion, headache, chills x 7 days

## 2020-03-20 ENCOUNTER — Telehealth: Payer: Self-pay | Admitting: Neurology

## 2020-03-20 ENCOUNTER — Encounter: Payer: Self-pay | Admitting: Neurology

## 2020-03-20 NOTE — Telephone Encounter (Signed)
After pt hung up with Hinton Dyer, she wrote a lengthy mychart message as well.

## 2020-03-20 NOTE — Telephone Encounter (Signed)
I had no idea that there wouldn't be an MD to do the new patient visits. I am sorry, but I have no further information. Please ask the patient to research/ check for tinnitus specialists within the [facility] system, etc. I will refer her where she wants to go- but this is not within my scope or possibilities in my practice. CD

## 2020-03-20 NOTE — Telephone Encounter (Signed)
Dr. [name], Please advise I was on the telephone with patient over thirty minutes.  I am calling to CX her apt this morning with the [facility]  where you referred her to because she preferred to see a doctor not a PA . I offered patient to send her referral to [facility] , [place] , [place]. Dr. [name]  she wants me to research [facility] . Doctor Dohmeier she really wants your advise. Patient is requesting Dr. [name] to call her.

## 2020-03-21 ENCOUNTER — Ambulatory Visit (INDEPENDENT_AMBULATORY_CARE_PROVIDER_SITE_OTHER): Payer: PRIVATE HEALTH INSURANCE | Admitting: *Deleted

## 2020-03-21 ENCOUNTER — Other Ambulatory Visit: Payer: Self-pay

## 2020-03-21 ENCOUNTER — Telehealth: Payer: Self-pay | Admitting: Interventional Cardiology

## 2020-03-21 DIAGNOSIS — Z91030 Bee allergy status: Secondary | ICD-10-CM

## 2020-03-21 DIAGNOSIS — Z91038 Other insect allergy status: Secondary | ICD-10-CM

## 2020-03-21 NOTE — Telephone Encounter (Signed)
Pt has been having intermittent CP, HAs and swelling since the end of April.  They have all doubled in severity over the last month.  Pt has been having swelling up to her knees, horrible belly pain, CP that radiates into left breast, throat and shoulder blades.  These symptoms started after her second dose of the Pfizer vaccine.  1st dose was 3/11 and 2nd dose was 4/5.  Her cousin, who is a Marine scientist, told her that her symptoms should be reported since they have been noted to occur after the vaccine in some people.  Nothing makes the pain or swelling worse or better.  Denies increased salt in diet.  Hydrates well.  Wears compression socks but they aren't really helping.  At times the chest pain feels like severe indigestion.  States her feet look like "hobbit feet".  Abdominal pain is in the gall bladder area (which she has had removed) and feels like someone is punching her.  After both doses of the vaccine, pt developed migraines that lasted 6-7 days.  Migraines are now lasting up to 15 days.  Advised I will send to Dr. [name] for review and if anything worsens to go to ER.

## 2020-03-21 NOTE — Telephone Encounter (Signed)
Pt c/o of Chest Pain: STAT if CP now or developed within 24 hours  1. Are you having CP right now? No  2. Are you experiencing any other symptoms (ex. SOB, nausea, vomiting, sweating)? Headaches, feet swelling, abdominal pain  3. How long have you been experiencing CP? 1 month, gradually becoming worse  4. Is your CP continuous or coming and going? Coming and going  5. Have you taken Nitroglycerin? No   Pt c/o swelling: STAT is pt has developed SOB within 24 hours  1) How much weight have you gained and in what time span? No weight gain  2) If swelling, where is the swelling located? Legs and feet  3) Are you currently taking a fluid pill? No   4) Are you currently SOB? No   5) Do you have a log of your daily weights (if so, list)? No log available  6) Have you gained 3 pounds in a day or 5 pounds in a week? No  7) Have you traveled recently? No  ?

## 2020-03-24 NOTE — Telephone Encounter (Signed)
Dr Thornell Mule has joined the [facility] system- he used to treat inner ear /hearing disabilities here in [place]. Would you like to see him?  I will be happy to contact him. CD

## 2020-03-28 ENCOUNTER — Ambulatory Visit: Payer: Self-pay

## 2020-04-03 NOTE — Telephone Encounter (Signed)
Called and spoke to the patient. She states that she has continued to have the same Sx as listed below that have been going on for 2 months (see below). These have not changed or worsened. She states that her husband is an ER physician and recommended she reach out to her cardiologist for recommendation. She states that it has been almost 2 weeks and has not heard anything back yet. The patient did not want to schedule an appointment at this time, but just wanted to hear back from Dr. [name]. Made her aware that I will forward to him and his RN.

## 2020-04-03 NOTE — Telephone Encounter (Signed)
Patient is calling back. She states that she was told that someone would be calling her back in regards to her chest pain and she has not heard anything. She has a lab appt tomorrow and wants to know if she can be seen then. I advised that right now we do not have any openings. Patient advised of Jennifer's advise on going to ER if things worsen and she stated that she is married to an ER doctor and he told her to call her cardiologist. Please advise on what she should do.

## 2020-04-04 ENCOUNTER — Ambulatory Visit (INDEPENDENT_AMBULATORY_CARE_PROVIDER_SITE_OTHER): Payer: PRIVATE HEALTH INSURANCE

## 2020-04-04 ENCOUNTER — Other Ambulatory Visit: Payer: PRIVATE HEALTH INSURANCE

## 2020-04-04 DIAGNOSIS — Z91030 Bee allergy status: Secondary | ICD-10-CM

## 2020-04-08 ENCOUNTER — Other Ambulatory Visit: Payer: PRIVATE HEALTH INSURANCE

## 2020-04-14 NOTE — Telephone Encounter (Signed)
Patient is calling to follow up again. She states that she has not spoken to anyone about her chest pain. She states she has been told twice that someone will either call her back in a couple of hours or the next morning. She does not want to go to the ER because her dr is a ER physician. She did not want to schedule with Dr. [name]. Patient seemed very upset about not hearing back. Please advise.

## 2020-04-14 NOTE — Telephone Encounter (Signed)
Spoke with pt and made her aware that Dr. [name] was planning to call her.  Pt mentioned that she is still having the CP and swelling is worsening in legs.  Pt mentioned that she had an appt in Nov with Dr. [name].  Advised there is nothing scheduled.  Offered an appt for this Thursday for eval and to discuss options.  Pt agreeable to plan.  Scheduled pt for 10AM with Dr. [name].  Pt appreciative for call.

## 2020-04-15 ENCOUNTER — Other Ambulatory Visit: Payer: PRIVATE HEALTH INSURANCE

## 2020-04-16 ENCOUNTER — Ambulatory Visit (INDEPENDENT_AMBULATORY_CARE_PROVIDER_SITE_OTHER): Payer: PRIVATE HEALTH INSURANCE | Admitting: *Deleted

## 2020-04-16 ENCOUNTER — Other Ambulatory Visit: Payer: Self-pay

## 2020-04-16 DIAGNOSIS — Z91038 Other insect allergy status: Secondary | ICD-10-CM

## 2020-04-16 DIAGNOSIS — Z91030 Bee allergy status: Secondary | ICD-10-CM | POA: Diagnosis not present

## 2020-04-16 NOTE — Progress Notes (Signed)
Cardiology Office Note:    Date:  04/17/2020   ID:  Jarrett Chicoine, DOB 24-Mar-1966, MRN [id number]  PCP:  Janus Molder, NP  Cardiologist:  No primary care provider on file.   Referring MD: Janus Molder, NP   Chief Complaint  Patient presents with  . Chest Pain    History of Present Illness:    [name] [name] is a 54 y.o. female with a hx of hyperlipidemia, hypertension, OSA, Dercum's disease, family h/o CAD, asthma, fibromyalgia, who present with c/o intermittent CP.  Mrs. Royal's complaints are numerous and very widespread.  Those that pertain to potential cardiovascular concern are as follows.  Since receiving the second dose of Pfizer mRNA vaccine for COVID-19 (last dose April 2021), she has experienced increased lower extremity swelling, dyspnea on exertion, and spontaneous episodes of chest discomfort in the left parasternal and precordial region that radiated into the left arm.  These episodes have occurred intermittently.  They are not related to physical activity.  Duration is minutes, but less than 30 before resolution.  Episodes can recur.  She reminds me of a significant family history of CAD.  Her father died at age 88 but began having ischemic cardiac events in his early 71s.  Her maternal grandparents also have premature coronary disease.  Her mother has not had cardiac problems nor has her brother.  Other risk factors include obesity and significant hyperlipidemia with the most recent LDL of 119 in May 2021 on therapy.  She has had some intolerance to statin therapy.  She has been seen in our lipid clinic.  She has been previously seen in the lipid clinic.  She was intolerant of pravastatin 20 mg and rosuvastatin 10 mg daily.  She was switched to Praluent but developed right arm right leg neck and back pain/tingling/swelling and tightness in her throat 30 minutes after the injection.  She is now on rosuvastatin 5 mg every other day.  The patient does not  drink, she is prediabetic.  And has slowly progressive obesity.  Past Medical History:  Diagnosis Date  . Anemia    many years ago  . Anxiety    situational  . Arthritis    "hands" (07/12/2012)  . Asthma    seasonal   . Chest pain   . Chest pain at rest, on going 07/12/2012  . Eczema   . Family history of early CAD 07/12/2012  . Fibromyalgia   . GERD (gastroesophageal reflux disease)   . Headache(784.0)    "often; not daily" (07/12/2012)  . Hypertension    not on medications  . Kidney stones   . Migraine   . Pneumonia    as a child  . PONV (postoperative nausea and vomiting)    she states she gets very sick    Past Surgical History:  Procedure Laterality Date  . breast lift    . CARDIAC CATHETERIZATION  07/11/2012  . CHOLECYSTECTOMY N/A 09/18/2015   Procedure: LAPAROSCOPIC CHOLECYSTECTOMY;  Surgeon: Coralie Keens, MD;  Location: [facility];  Service: General;  Laterality: N/A;  . FOOT SURGERY  09/2019   toe surgery  . LEFT HEART CATHETERIZATION WITH CORONARY ANGIOGRAM N/A 07/11/2012   Procedure: LEFT HEART CATHETERIZATION WITH CORONARY ANGIOGRAM;  Surgeon: Lorretta Harp, MD;  Location: [facility] CATH LAB;  Service: Cardiovascular;  Laterality: N/A;  . REDUCTION MAMMAPLASTY Bilateral 10+ years ago  . TONSILLECTOMY  ~ 1976  . tubes and ovaries removed  2015  . VAGINAL HYSTERECTOMY  ~ 2009  Current Medications: No outpatient medications have been marked as taking for the 04/17/20 encounter (Office Visit) with Belva Crome, MD.     Allergies:   Betadine [povidone iodine], Contrast media [iodinated diagnostic agents], Morphine and related, Prednisone, Yellow jacket venom [bee venom], Azithromycin, Erythromycin, Iohexol, Oxycontin [oxycodone], Avelox [moxifloxacin hcl in nacl], Codeine, Gadolinium derivatives, Iodine, Lamisil [terbinafine], Levaquin [levofloxacin in d5w], Oxycodone-acetaminophen, Pantoprazole, Praluent [alirocumab], Shellfish allergy, Augmentin  [amoxicillin-pot clavulanate], Dilaudid [hydromorphone hcl], Oxycodone hcl, and Tramadol   Social History   Socioeconomic History  . Marital status: Married    Spouse name: Not on file  . Number of children: Not on file  . Years of education: Not on file  . Highest education level: Not on file  Occupational History  . Not on file  Tobacco Use  . Smoking status: Never Smoker  . Smokeless tobacco: Never Used  Vaping Use  . Vaping Use: Never used  Substance and Sexual Activity  . Alcohol use: No  . Drug use: No  . Sexual activity: Yes  Other Topics Concern  . Not on file  Social History Narrative  . Not on file   Social Determinants of Health   Financial Resource Strain:   . Difficulty of Paying Living Expenses: Not on file  Food Insecurity:   . Worried About Charity fundraiser in the Last Year: Not on file  . Ran Out of Food in the Last Year: Not on file  Transportation Needs:   . Lack of Transportation (Medical): Not on file  . Lack of Transportation (Non-Medical): Not on file  Physical Activity:   . Days of Exercise per Week: Not on file  . Minutes of Exercise per Session: Not on file  Stress:   . Feeling of Stress : Not on file  Social Connections:   . Frequency of Communication with Friends and Family: Not on file  . Frequency of Social Gatherings with Friends and Family: Not on file  . Attends Religious Services: Not on file  . Active Member of Clubs or Organizations: Not on file  . Attends Archivist Meetings: Not on file  . Marital Status: Not on file     Family History: The patient's family history includes Allergic rhinitis in her brother, maternal aunt, and mother; Chronic Renal Failure in her paternal grandmother; Depression in her mother; Drug abuse in her son; Heart disease in her father; Hiatal hernia in her mother; Hypertension in her mother; Stroke in her maternal grandfather.  ROS:   Please see the history of present illness.    She has  "Dercum's disease" with increasing weight, subcutaneous fat deposition, lower extremity swelling, musculoskeletal pain, and anxiety/depression related to the absence of knowledge concerning the illness and no specific therapy.  We spent a considerable time frame allowing her to express symptoms that she feels are associated.  She does have sleep apnea.  She is compliant with CPAP.  All other systems reviewed and are negative.  EKGs/Labs/Other Studies Reviewed:    The following studies were reviewed today:  NUCLEAR SCINTIGRAPHY 2020: Study Highlights    Nuclear stress EF: 64%.  No T wave inversion was noted during stress.  There was no ST segment deviation noted during stress.  The study is normal.  This is a low risk study.   Normal perfusion. LVEF 64% with normal wall motion. This is a low risk study.  ECHOCARDIOGRAM 2019:  Study Conclusions   - Left ventricle: The cavity size was normal.  Wall thickness was  increased in a pattern of mild LVH. Systolic function was normal.  The estimated ejection fraction was in the range of 55% to 60%.  Wall motion was normal; there were no regional wall motion  abnormalities.    EKG:  EKG performed today shows reduced voltage, sinus rhythm, with normal overall appearance.  Recent Labs: 05/18/2019: Hemoglobin 13.7; Platelets 287 12/03/2019: ALT 10  Recent Lipid Panel    Component Value Date/Time   CHOL 220 (H) 12/03/2019 1435   TRIG 144 12/03/2019 1435   HDL 76 12/03/2019 1435   CHOLHDL 2.9 12/03/2019 1435   LDLCALC 119 (H) 12/03/2019 1435    Physical Exam:    VS:  BP 124/86   Pulse 87   Ht 5' 5" (1.651 m)   Wt 227 lb (103 kg)   SpO2 96%   BMI 37.77 kg/m     Wt Readings from Last 3 Encounters:  04/17/20 227 lb (103 kg)  03/15/20 180 lb (81.6 kg)  12/05/19 219 lb (99.3 kg)     GEN: Morbid obesity. No acute distress HEENT: Normal NECK: No JVD. LYMPHATICS: No lymphadenopathy CARDIAC:  RRR without murmur,  gallop, but with bilateral pitting ankle to mid shin edema. VASCULAR:  Normal Pulses. No bruits. RESPIRATORY:  Clear to auscultation without rales, wheezing or rhonchi  ABDOMEN: Soft, non-tender, non-distended, No pulsatile mass, MUSCULOSKELETAL: No deformity  SKIN: Warm and dry NEUROLOGIC:  Alert and oriented x 3 PSYCHIATRIC:  Normal affect   ASSESSMENT:    1. Precordial pain   2. Anaphylaxis, subsequent encounter   3. Familial hypercholesterolemia   4. Essential hypertension   5. Family history of early CAD   73. OSA on CPAP   7. Morbid obesity ([place])   8. Dercum's disease   9. Educated about COVID-19 virus infection   76. Mixed hyperlipidemia    PLAN:    In order of problems listed above:  1. Chest discomfort with atypical features.  Multiple risk factors for ischemic heart disease.  Coronary CTA with FFR if indicated and calcium score to fully define anatomy and rule out significant underlying disease.  Unfortunately she has a "history of anaphylaxis to contrast".  She will be treated with premed regimen starting the day prior to contrast exposure to minimize the risk of reaction.  Protocol to treat anaphylaxis will be in effect on the day of the procedure. 2. See above 3. Attempting to control LDL cholesterol to decrease risk of vascular events.  She is now on low-dose rosuvastatin 5 mg every other day.  Blood work will be obtained soon. 4. Blood pressure is adequate today.  Therapy is Benicar 20 mg/day which is well-tolerated.  She is also receiving furosemide 20 mg/day. 5. Discussed 6. Compliant with CPAP 7. She is aware and limited in what she can do. 8. No solution for this problem that I have been able to identify.  Did not find anyone at a [facility] who professes expertise. 9. She has received 2 doses of mRNA vaccine, Pfizer.   Medication Adjustments/Labs and Tests Ordered: Current medicines are reviewed at length with the patient today.  Concerns regarding  medicines are outlined above.  Orders Placed This Encounter  Procedures  . CT CORONARY MORPH W/CTA COR W/SCORE W/CA W/CM &/OR WO/CM  . CT CORONARY FRACTIONAL FLOW RESERVE DATA PREP  . CT CORONARY FRACTIONAL FLOW RESERVE FLUID ANALYSIS  . Basic metabolic panel  . EKG 12-Lead   Meds ordered this encounter  Medications  . metoprolol tartrate (LOPRESSOR) 100 MG tablet    Sig: Take one tablet by mouth 2 hours prior to your CT    Dispense:  1 tablet    Refill:  0  . predniSONE (DELTASONE) 50 MG tablet    Sig: Take one tablet by mouth 13 hours, 7 hours, and 1 hour prior to CT    Dispense:  3 tablet    Refill:  0    Patient Instructions  Medication Instructions:  Your physician recommends that you continue on your current medications as directed. Please refer to the Current Medication list given to you today.  *If you need a refill on your cardiac medications before your next appointment, please call your pharmacy*   Lab Work: BMET today  If you have labs (blood work) drawn today and your tests are completely normal, you will receive your results only by: Marland Kitchen MyChart Message (if you have MyChart) OR . A paper copy in the mail If you have any lab test that is abnormal or we need to change your treatment, we will call you to review the results.   Testing/Procedures: Your physician recommends that you have a Coronary CT performed.    Follow-Up: At [facility], you and your health needs are our priority.  As part of our continuing mission to provide you with exceptional heart care, we have created designated Provider Care Teams.  These Care Teams include your primary Cardiologist (physician) and Advanced Practice Providers (APPs -  Physician Assistants and Nurse Practitioners) who all work together to provide you with the care you need, when you need it.  We recommend signing up for the patient portal called "MyChart".  Sign up information is provided on this After Visit Summary.   MyChart is used to connect with patients for Virtual Visits (Telemedicine).  Patients are able to view lab/test results, encounter notes, upcoming appointments, etc.  Non-urgent messages can be sent to your provider as well.   To learn more about what you can do with MyChart, go to [email or web address].    Your next appointment:   As needed  The format for your next appointment:   In Person  Provider:   You may see Dr. Daneen Schick or one of the following Advanced Practice Providers on your designated Care Team:    Truitt Merle, NP  Cecilie Kicks, NP  Kathyrn Drown, NP    Other Instructions  Your cardiac CT will be scheduled at one of the below locations:   [facility] [address] [place], [place] [address] [phone]  [place] [address] [place], [place] [address] [phone]  If scheduled at [facility], please arrive at the [facility] main entrance of [facility] 30 minutes prior to test start time. Proceed to the [facility] Radiology Department (first floor) to check-in and test prep.  If scheduled at [facility], please arrive 15 mins early for check-in and test prep.  Please follow these instructions carefully (unless otherwise directed):  Hold all erectile dysfunction medications at least 3 days (72 hrs) prior to test.  On the Night Before the Test: . Be sure to Drink plenty of water. . Do not consume any caffeinated/decaffeinated beverages or chocolate 12 hours prior to your test. . Do not take any antihistamines 12 hours prior to your test. . If the patient has contrast allergy: ? Patient will need a prescription for Prednisone  and very clear instructions (as follows): 1. Prednisone 50 mg - take 13 hours prior to test 2. Take another Prednisone 50 mg 7 hours prior to test 3. Take another Prednisone 50 mg 1 hour prior to test 4. Take  Benadryl 50 mg 1 hour prior to test . Patient must complete all four doses of above prophylactic medications. . Patient will need a ride after test due to Benadryl.  On the Day of the Test: . Drink plenty of water. Do not drink any water within one hour of the test. . Do not eat any food 4 hours prior to the test. . You may take your regular medications prior to the test.  . Take metoprolol (Lopressor) two hours prior to test. . HOLD Furosemide/Hydrochlorothiazide morning of the test. . FEMALES- please wear underwire-free bra if available       After the Test: . Drink plenty of water. . After receiving IV contrast, you may experience a mild flushed feeling. This is normal. . On occasion, you may experience a mild rash up to 24 hours after the test. This is not dangerous. If this occurs, you can take Benadryl 25 mg and increase your fluid intake. . If you experience trouble breathing, this can be serious. If it is severe call 911 IMMEDIATELY. If it is mild, please call our office. . If you take any of these medications: Glipizide/Metformin, Avandament, Glucavance, please do not take 48 hours after completing test unless otherwise instructed.   Once we have confirmed authorization from your insurance company, we will call you to set up a date and time for your test. Based on how quickly your insurance processes prior authorizations requests, please allow up to 4 weeks to be contacted for scheduling your Cardiac CT appointment. Be advised that routine Cardiac CT appointments could be scheduled as many as 8 weeks after your provider has ordered it.  For non-scheduling related questions, please contact the cardiac imaging nurse navigator should you have any questions/concerns: Marchia Bond, Cardiac Imaging Nurse Navigator Burley Saver, Interim Cardiac Imaging Nurse [place] and Vascular Services Direct Office Dial: [phone]   For scheduling needs, including cancellations  and rescheduling, please call Vivien Rota at [phone], option 3.       Signed, Sinclair Grooms, MD  04/17/2020 1:59 PM    [facility]

## 2020-04-17 ENCOUNTER — Encounter: Payer: Self-pay | Admitting: Interventional Cardiology

## 2020-04-17 ENCOUNTER — Other Ambulatory Visit: Payer: PRIVATE HEALTH INSURANCE

## 2020-04-17 ENCOUNTER — Ambulatory Visit: Payer: PRIVATE HEALTH INSURANCE | Admitting: Interventional Cardiology

## 2020-04-17 VITALS — BP 124/86 | HR 87 | Ht 65.0 in | Wt 227.0 lb

## 2020-04-17 DIAGNOSIS — G4733 Obstructive sleep apnea (adult) (pediatric): Secondary | ICD-10-CM

## 2020-04-17 DIAGNOSIS — E7801 Familial hypercholesterolemia: Secondary | ICD-10-CM

## 2020-04-17 DIAGNOSIS — R072 Precordial pain: Secondary | ICD-10-CM | POA: Diagnosis not present

## 2020-04-17 DIAGNOSIS — I10 Essential (primary) hypertension: Secondary | ICD-10-CM

## 2020-04-17 DIAGNOSIS — Z9989 Dependence on other enabling machines and devices: Secondary | ICD-10-CM

## 2020-04-17 DIAGNOSIS — Z8249 Family history of ischemic heart disease and other diseases of the circulatory system: Secondary | ICD-10-CM

## 2020-04-17 DIAGNOSIS — E782 Mixed hyperlipidemia: Secondary | ICD-10-CM

## 2020-04-17 DIAGNOSIS — Z7189 Other specified counseling: Secondary | ICD-10-CM

## 2020-04-17 DIAGNOSIS — T782XXD Anaphylactic shock, unspecified, subsequent encounter: Secondary | ICD-10-CM

## 2020-04-17 DIAGNOSIS — E882 Lipomatosis, not elsewhere classified: Secondary | ICD-10-CM

## 2020-04-17 MED ORDER — PREDNISONE 50 MG PO TABS: ORAL_TABLET | ORAL | 0 refills | Status: DC

## 2020-04-17 MED ORDER — METOPROLOL TARTRATE 100 MG PO TABS: ORAL_TABLET | ORAL | 0 refills | Status: DC

## 2020-04-17 NOTE — Patient Instructions (Addendum)
Medication Instructions:  Your physician recommends that you continue on your current medications as directed. Please refer to the Current Medication list given to you today.  *If you need a refill on your cardiac medications before your next appointment, please call your pharmacy*   Lab Work: BMET today  If you have labs (blood work) drawn today and your tests are completely normal, you will receive your results only by: Marland Kitchen MyChart Message (if you have MyChart) OR . A paper copy in the mail If you have any lab test that is abnormal or we need to change your treatment, we will call you to review the results.   Testing/Procedures: Your physician recommends that you have a Coronary CT performed.    Follow-Up: At [facility], you and your health needs are our priority.  As part of our continuing mission to provide you with exceptional heart care, we have created designated Provider Care Teams.  These Care Teams include your primary Cardiologist (physician) and Advanced Practice Providers (APPs -  Physician Assistants and Nurse Practitioners) who all work together to provide you with the care you need, when you need it.  We recommend signing up for the patient portal called "MyChart".  Sign up information is provided on this After Visit Summary.  MyChart is used to connect with patients for Virtual Visits (Telemedicine).  Patients are able to view lab/test results, encounter notes, upcoming appointments, etc.  Non-urgent messages can be sent to your provider as well.   To learn more about what you can do with MyChart, go to [email or web address].    Your next appointment:   As needed  The format for your next appointment:   In Person  Provider:   You may see Dr. Daneen Schick or one of the following Advanced Practice Providers on your designated Care Team:    Truitt Merle, NP  Cecilie Kicks, NP  Kathyrn Drown, NP    Other Instructions  Your cardiac CT will be scheduled at  one of the below locations:   [facility] [address] [place], [place] [address] [phone]  [place] [address] [place], [place] [address] [phone]  If scheduled at [facility], please arrive at the [facility] main entrance of [facility] 30 minutes prior to test start time. Proceed to the [facility] Radiology Department (first floor) to check-in and test prep.  If scheduled at [facility], please arrive 15 mins early for check-in and test prep.  Please follow these instructions carefully (unless otherwise directed):  Hold all erectile dysfunction medications at least 3 days (72 hrs) prior to test.  On the Night Before the Test: . Be sure to Drink plenty of water. . Do not consume any caffeinated/decaffeinated beverages or chocolate 12 hours prior to your test. . Do not take any antihistamines 12 hours prior to your test. . If the patient has contrast allergy: ? Patient will need a prescription for Prednisone and very clear instructions (as follows): 1. Prednisone 50 mg - take 13 hours prior to test 2. Take another Prednisone 50 mg 7 hours prior to test 3. Take another Prednisone 50 mg 1 hour prior to test 4. Take Benadryl 50 mg 1 hour prior to test . Patient must complete all four doses of above prophylactic medications. . Patient will need a ride after test due to Benadryl.  On the Day of the Test: . Drink plenty of  water. Do not drink any water within one hour of the test. . Do not eat any food 4 hours prior to the test. . You may take your regular medications prior to the test.  . Take metoprolol (Lopressor) two hours prior to test. . HOLD Furosemide/Hydrochlorothiazide morning of the test. . FEMALES- please wear underwire-free bra if available       After the Test: . Drink plenty of water. . After receiving IV contrast, you may experience a  mild flushed feeling. This is normal. . On occasion, you may experience a mild rash up to 24 hours after the test. This is not dangerous. If this occurs, you can take Benadryl 25 mg and increase your fluid intake. . If you experience trouble breathing, this can be serious. If it is severe call 911 IMMEDIATELY. If it is mild, please call our office. . If you take any of these medications: Glipizide/Metformin, Avandament, Glucavance, please do not take 48 hours after completing test unless otherwise instructed.   Once we have confirmed authorization from your insurance company, we will call you to set up a date and time for your test. Based on how quickly your insurance processes prior authorizations requests, please allow up to 4 weeks to be contacted for scheduling your Cardiac CT appointment. Be advised that routine Cardiac CT appointments could be scheduled as many as 8 weeks after your provider has ordered it.  For non-scheduling related questions, please contact the cardiac imaging nurse navigator should you have any questions/concerns: Marchia Bond, Cardiac Imaging Nurse Navigator Burley Saver, Interim Cardiac Imaging Nurse [place] and Vascular Services Direct Office Dial: [phone]   For scheduling needs, including cancellations and rescheduling, please call Vivien Rota at [phone], option 3.

## 2020-04-18 LAB — HEPATIC FUNCTION PANEL
ALT: 16 IU/L (ref 0–32)
AST: 11 IU/L (ref 0–40)
Albumin: 4.5 g/dL (ref 3.8–4.9)
Alkaline Phosphatase: 74 IU/L (ref 44–121)
Bilirubin Total: 0.3 mg/dL (ref 0.0–1.2)
Bilirubin, Direct: <0.1 mg/dL (ref 0.00–0.40)
Total Protein: 7 g/dL (ref 6.0–8.5)

## 2020-04-18 LAB — NMR, LIPOPROFILE
Cholesterol, Total: 215 mg/dL — ABNORMAL HIGH (ref 100–199)
HDL Particle Number: 56.8 umol/L (ref >=30.5)
HDL-C: 66 mg/dL (ref >39)
LDL Particle Number: 1483 nmol/L — ABNORMAL HIGH (ref <1000)
LDL Size: 20.9 nm (ref >20.5)
LDL-C (NIH Calc): 101 mg/dL — ABNORMAL HIGH (ref 0–99)
LP-IR Score: 59 — ABNORMAL HIGH (ref <=45)
Small LDL Particle Number: 912 nmol/L — ABNORMAL HIGH (ref <=527)
Triglycerides: 285 mg/dL — ABNORMAL HIGH (ref 0–149)

## 2020-04-18 LAB — BASIC METABOLIC PANEL
BUN/Creatinine Ratio: 15 (ref 9–23)
BUN: 13 mg/dL (ref 6–24)
CO2: 25 mmol/L (ref 20–29)
Calcium: 9.5 mg/dL (ref 8.7–10.2)
Chloride: 103 mmol/L (ref 96–106)
Creatinine, Ser: 0.86 mg/dL (ref 0.57–1.00)
GFR calc Af Amer: 89 mL/min/{1.73_m2} (ref >59)
GFR calc non Af Amer: 77 mL/min/{1.73_m2} (ref >59)
Glucose: 93 mg/dL (ref 65–99)
Potassium: 4.7 mmol/L (ref 3.5–5.2)
Sodium: 142 mmol/L (ref 134–144)

## 2020-04-18 LAB — C-REACTIVE PROTEIN: CRP: 5 mg/L (ref 0–10)

## 2020-04-18 LAB — LIPOPROTEIN A (LPA): Lipoprotein (a): 50.8 nmol/L (ref <75.0)

## 2020-04-21 ENCOUNTER — Other Ambulatory Visit: Payer: PRIVATE HEALTH INSURANCE

## 2020-04-22 ENCOUNTER — Telehealth: Payer: Self-pay | Admitting: Pharmacist

## 2020-04-22 DIAGNOSIS — E7801 Familial hypercholesterolemia: Secondary | ICD-10-CM

## 2020-04-22 NOTE — Telephone Encounter (Signed)
Called pt to discuss lipid panel results. No answer, voicemail not set up. Lp(a) has improved notably from 207.5 to 50.8, however LDL remains elevated at 101 and LDL particle # is elevated at 1484.  Will discuss increasing rosuvastatin 5mg  every other day to daily dosing or add ezetimibe when pt returns call.

## 2020-04-23 ENCOUNTER — Ambulatory Visit (INDEPENDENT_AMBULATORY_CARE_PROVIDER_SITE_OTHER): Payer: PRIVATE HEALTH INSURANCE

## 2020-04-23 DIAGNOSIS — Z91030 Bee allergy status: Secondary | ICD-10-CM

## 2020-04-23 MED ORDER — ROSUVASTATIN CALCIUM 5 MG PO TABS: 5.0000 mg | ORAL_TABLET | Freq: Every day | ORAL | 11 refills | Status: DC

## 2020-04-23 NOTE — Addendum Note (Signed)
Addended by: Kenijah Benningfield E on: 04/23/2020 11:10 AM   Modules accepted: Orders

## 2020-04-23 NOTE — Telephone Encounter (Addendum)
Spoke with pt and reviewed results. She is willing to increase rosuvastatin 5mg  frequency to once daily dosing. Will recheck NMR panel in 2 months. Can consider further dose titration or addition of ezetimibe at that time if needed. Slowly titrating meds due to previous intolerances and muscle aches from Dercum's disease.

## 2020-04-30 ENCOUNTER — Ambulatory Visit (INDEPENDENT_AMBULATORY_CARE_PROVIDER_SITE_OTHER): Payer: PRIVATE HEALTH INSURANCE

## 2020-04-30 DIAGNOSIS — Z91030 Bee allergy status: Secondary | ICD-10-CM | POA: Diagnosis not present

## 2020-05-07 ENCOUNTER — Ambulatory Visit: Payer: Self-pay

## 2020-05-07 ENCOUNTER — Ambulatory Visit (INDEPENDENT_AMBULATORY_CARE_PROVIDER_SITE_OTHER): Payer: PRIVATE HEALTH INSURANCE | Admitting: *Deleted

## 2020-05-07 DIAGNOSIS — Z91038 Other insect allergy status: Secondary | ICD-10-CM | POA: Diagnosis not present

## 2020-05-07 NOTE — Telephone Encounter (Signed)
Called and spoke with pt and she is upset about the amount of time it has taken to get scheduled for her Coronary CT.  Advised I received the approval letter in Dr. Thompson Caul mailbox on Monday and sent information over to [place] scheduler, Vivien Rota.  Pt states she has not heard from them to get her scheduled.  Provided her with the number to contact them for scheduling.  She will call now to try to get an appt.  If unable to reach anyone, she will call or MyChart me to let me know.

## 2020-05-15 ENCOUNTER — Other Ambulatory Visit: Payer: Self-pay

## 2020-05-15 ENCOUNTER — Ambulatory Visit (INDEPENDENT_AMBULATORY_CARE_PROVIDER_SITE_OTHER): Payer: PRIVATE HEALTH INSURANCE

## 2020-05-15 ENCOUNTER — Telehealth (HOSPITAL_COMMUNITY): Payer: Self-pay | Admitting: *Deleted

## 2020-05-15 DIAGNOSIS — Z91038 Other insect allergy status: Secondary | ICD-10-CM | POA: Diagnosis not present

## 2020-05-15 NOTE — Telephone Encounter (Signed)
Reaching out to patient to offer assistance regarding upcoming cardiac imaging study; pt verbalizes understanding of appt date/time, parking situation and where to check in, pre-test NPO status and medications ordered, and verified current allergies; name and call back number provided for further questions should they arise  [place] and Vascular [phone] office [phone] cell  Pt verbalized understanding of how to take 13hr prep. Pt reports she has lot of allergies.

## 2020-05-16 ENCOUNTER — Ambulatory Visit (HOSPITAL_COMMUNITY)
Admission: RE | Admit: 2020-05-16 | Discharge: 2020-05-16 | Disposition: A | Discharge status: Home / Self Care | Payer: PRIVATE HEALTH INSURANCE | Source: Ambulatory Visit | Attending: Interventional Cardiology | Admitting: Interventional Cardiology

## 2020-05-16 ENCOUNTER — Encounter: Payer: Self-pay | Admitting: *Deleted

## 2020-05-16 ENCOUNTER — Telehealth: Payer: Self-pay | Admitting: Interventional Cardiology

## 2020-05-16 DIAGNOSIS — R072 Precordial pain: Secondary | ICD-10-CM | POA: Insufficient documentation

## 2020-05-16 MED ORDER — NITROGLYCERIN 0.4 MG SL SUBL
0.8000 mg | SUBLINGUAL_TABLET | Freq: Once | SUBLINGUAL | Status: AC
  Administered 2020-05-16: 0.8 mg via SUBLINGUAL

## 2020-05-16 MED ORDER — ONDANSETRON HCL 4 MG/2ML IJ SOLN: 4.0000 mg | Freq: Once | INTRAMUSCULAR | Status: AC

## 2020-05-16 MED ORDER — METOPROLOL TARTRATE 5 MG/5ML IV SOLN
10.0000 mg | INTRAVENOUS | Status: DC | PRN
  Administered 2020-05-16: 10 mg via INTRAVENOUS

## 2020-05-16 MED ORDER — ONDANSETRON HCL 4 MG/2ML IJ SOLN
INTRAMUSCULAR | Status: AC
  Administered 2020-05-16: 4 mg via INTRAVENOUS
  Filled 2020-05-16: qty 2

## 2020-05-16 MED ORDER — IOHEXOL 350 MG/ML SOLN
80.0000 mL | Freq: Once | INTRAVENOUS | Status: AC | PRN
  Administered 2020-05-16: 80 mL via INTRAVENOUS

## 2020-05-16 MED ORDER — NITROGLYCERIN 0.4 MG SL SUBL
SUBLINGUAL_TABLET | SUBLINGUAL | Status: AC
  Filled 2020-05-16: qty 2

## 2020-05-16 MED ORDER — METOPROLOL TARTRATE 5 MG/5ML IV SOLN
INTRAVENOUS | Status: AC
  Administered 2020-05-16: 10 mg via INTRAVENOUS
  Filled 2020-05-16: qty 15

## 2020-05-16 NOTE — Telephone Encounter (Signed)
Spoke with pt and reviewed Coronary CT results and recommendations.  Pt had a few questions:  1) With her Dercum's disease and high cholesterol, should she continue her statin?  2) Is her statin helping with inflammation?  3) There is a 4mm lung nodule noted on her CT.  Pt states that one issue Dercum's can cause is the formation of Lipomas in the lungs, which can also spread.  Should she have follow up imaging? If so, with who?  States PCP is not really accepting of the Dercums diagnosis, so not really helping.  States Neuro has been helpful with trying to treat her.  Should we refer to Pulmonology?  Pt is having an NMR Lipid Profile drawn on 07/01/2020 by our [facility].   You should be able to just hit reply to patient and it go straight to Mrs. Franckowiak.

## 2020-05-16 NOTE — Telephone Encounter (Signed)
Spoke with pt and reviewed results.  She had further questions that she requested we respond with on MyChart.  MyChart message started and sent to Dr. [name]

## 2020-05-16 NOTE — Telephone Encounter (Signed)
Pt return call to [facility] for CT results , she stated she has her cell phone in hand and will be watching for the call

## 2020-05-21 ENCOUNTER — Ambulatory Visit (INDEPENDENT_AMBULATORY_CARE_PROVIDER_SITE_OTHER): Payer: PRIVATE HEALTH INSURANCE | Admitting: *Deleted

## 2020-05-21 ENCOUNTER — Other Ambulatory Visit: Payer: Self-pay

## 2020-05-21 DIAGNOSIS — Z91038 Other insect allergy status: Secondary | ICD-10-CM | POA: Diagnosis not present

## 2020-05-22 ENCOUNTER — Ambulatory Visit (INDEPENDENT_AMBULATORY_CARE_PROVIDER_SITE_OTHER): Payer: PRIVATE HEALTH INSURANCE | Admitting: Podiatry

## 2020-05-22 ENCOUNTER — Telehealth: Payer: Self-pay

## 2020-05-22 ENCOUNTER — Other Ambulatory Visit: Payer: Self-pay

## 2020-05-22 ENCOUNTER — Emergency Department (HOSPITAL_COMMUNITY)
Admission: EM | Admit: 2020-05-22 | Discharge: 2020-05-22 | Disposition: A | Discharge status: Home / Self Care | Payer: PRIVATE HEALTH INSURANCE | Attending: Emergency Medicine | Admitting: Emergency Medicine

## 2020-05-22 ENCOUNTER — Encounter (HOSPITAL_COMMUNITY): Payer: Self-pay | Admitting: Emergency Medicine

## 2020-05-22 DIAGNOSIS — L03032 Cellulitis of left toe: Secondary | ICD-10-CM

## 2020-05-22 DIAGNOSIS — J45909 Unspecified asthma, uncomplicated: Secondary | ICD-10-CM | POA: Diagnosis not present

## 2020-05-22 DIAGNOSIS — Z20822 Contact with and (suspected) exposure to covid-19: Secondary | ICD-10-CM

## 2020-05-22 DIAGNOSIS — Z79899 Other long term (current) drug therapy: Secondary | ICD-10-CM | POA: Insufficient documentation

## 2020-05-22 DIAGNOSIS — Z7982 Long term (current) use of aspirin: Secondary | ICD-10-CM | POA: Diagnosis not present

## 2020-05-22 DIAGNOSIS — I10 Essential (primary) hypertension: Secondary | ICD-10-CM | POA: Diagnosis not present

## 2020-05-22 LAB — RESPIRATORY PANEL BY RT PCR (FLU A&B, COVID)
Influenza A by PCR: NEGATIVE
Influenza B by PCR: NEGATIVE
SARS Coronavirus 2 by RT PCR: NEGATIVE

## 2020-05-22 NOTE — Telephone Encounter (Signed)
Pt called today very upset about the results of her toenail after a procedure back in November 29, 2019. Pt feels that her toenail has not been the same since the procedure and she wished that she had never got it completed. Pt stated that she is about to leave out of the country today and would like for her toenail to be assessed prior to going. I've schedule the patient today to see Dr. Jacqualyn Posey at 1130a to assess her toenail and answer her concerns. I've also told the pt that her co-pay would be waived also.

## 2020-05-22 NOTE — ED Provider Notes (Addendum)
[facility] EMERGENCY DEPARTMENT Provider Note   CSN: [id number] Arrival date & time: 05/22/20  1704     History Chief Complaint  Patient presents with  . Other    [name] [name] is a 54 y.o. female.  Patient needed Covid testing.  No complaints        Past Medical History:  Diagnosis Date  . Anemia    many years ago  . Anxiety    situational  . Arthritis    "hands" (07/12/2012)  . Asthma    seasonal   . Chest pain   . Chest pain at rest, on going 07/12/2012  . Eczema   . Family history of early CAD 07/12/2012  . Fibromyalgia   . GERD (gastroesophageal reflux disease)   . Headache(784.0)    "often; not daily" (07/12/2012)  . Hypertension    not on medications  . Kidney stones   . Migraine   . Pneumonia    as a child  . PONV (postoperative nausea and vomiting)    she states she gets very sick    Patient Active Problem List   Diagnosis Date Noted  . OSA (obstructive sleep apnea) 12/05/2019  . Lymphedema in adult patient 12/05/2019  . Bacterial vaginosis 09/19/2019  . Candidal vulvovaginitis 09/19/2019  . Dysuria 09/19/2019  . Urine finding 09/19/2019  . Vitamin D deficiency 09/19/2019  . Other allergic rhinitis 05/21/2019  . 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 Adverse food reaction 05/21/2019  . Coughing 05/21/2019  . Allergic reaction 05/17/2019  . Hymenoptera allergy 05/17/2019  . Multiple drug allergies 05/17/2019  . Upper airway cough syndrome 02/06/2019  . DOE (dyspnea on exertion) 02/06/2019  . Morbid obesity ([place]) 11/28/2018  . 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 Tinnitus aurium, unspecified laterality 11/28/2018  . New daily persistent headache 11/28/2018  . OSA on CPAP 11/28/2018  . Acquired trigger finger of left index finger 08/29/2017  . Pain in right hand 08/29/2017  . Trigger finger of right hand 08/29/2017  . Lipoedema 03/14/2017  . Blurring of vision 03/14/2017  . Class 2 obesity with body mass index  (BMI) of 38.0 to 38.9 in adult 03/14/2017  . Fibromyalgia 11/09/2016  . History of migraine 11/09/2016  . History of environmental allergies 11/09/2016  . Abdominal apron 10/21/2016  . Myalgia 05/13/2016  . Adjustment insomnia 05/13/2016  . Snoring 05/13/2016  . 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 Hyperlipidemia 11/03/2014  . Pelvic pain in female 07/30/2014  . Chest pain at rest, on going, probable GI source 07/12/2012  . HTN (hypertension) 07/12/2012  . Family history of early CAD 07/12/2012  . Anxiety 07/12/2012    Past Surgical History:  Procedure Laterality Date  . breast lift    . CARDIAC CATHETERIZATION  07/11/2012  . CHOLECYSTECTOMY N/A 09/18/2015   Procedure: LAPAROSCOPIC CHOLECYSTECTOMY;  Surgeon: Coralie Keens, MD;  Location: [place];  Service: General;  Laterality: N/A;  . FOOT SURGERY  09/2019   toe surgery  . LEFT HEART CATHETERIZATION WITH CORONARY ANGIOGRAM N/A 07/11/2012   Procedure: LEFT HEART CATHETERIZATION WITH CORONARY ANGIOGRAM;  Surgeon: Lorretta Harp, MD;  Location: [facility] CATH LAB;  Service: Cardiovascular;  Laterality: N/A;  . REDUCTION MAMMAPLASTY Bilateral 10+ years ago  . TONSILLECTOMY  ~ 1976  . tubes and ovaries removed  2015  . VAGINAL HYSTERECTOMY  ~ 2009     OB History   No obstetric history on file.     Family History  Problem Relation Age  of Onset  . Hiatal hernia Mother   . Hypertension Mother   . Depression Mother   . Allergic rhinitis Mother   . Heart disease Father   . Stroke Maternal Grandfather   . Chronic Renal Failure Paternal Grandmother   . Drug abuse Son   . Allergic rhinitis Brother   . Allergic rhinitis Maternal Aunt     Social History   Tobacco Use  . Smoking status: Never Smoker  . Smokeless tobacco: Never Used  Vaping Use  . Vaping Use: Never used  Substance Use Topics  . Alcohol use: No  . Drug use: No    Home Medications Prior to Admission medications   Medication Sig Start Date End Date Taking?  Authorizing Provider  aspirin EC 81 MG tablet Take 1 tablet (81 mg total) by mouth daily. 05/01/19   Belva Crome, MD  clobetasol (TEMOVATE) 0.05 % external solution clobetasol 0.05 % scalp solution  APPLY TO THE SCALP DAILY AS NEEDED FOR [facility]    [provider]  EPINEPHrine (EPIPEN 2-PAK) 0.3 mg/0.3 mL IJ SOAJ injection Inject 0.3 mLs (0.3 mg total) into the muscle as needed. 10/03/19   Garnet Sierras, DO  estradiol (ESTRACE) 1 MG tablet  04/11/19   [provider]  furosemide (LASIX) 20 MG tablet Take 1 tablet (20 mg total) by mouth daily as needed for fluid or edema. 12/04/19   Kathyrn Drown D, NP  ipratropium (ATROVENT) 0.03 % nasal spray Place 1-2 sprays into both nostrils 3 (three) times daily as needed for rhinitis. 01/07/20   Garnet Sierras, DO  ketoconazole (NIZORAL) 2 % shampoo  04/11/19   [provider]  levocetirizine (XYZAL) 5 MG tablet Take 5 mg by mouth every evening.    [provider]  metoprolol tartrate (LOPRESSOR) 100 MG tablet Take one tablet by mouth 2 hours prior to your CT 04/17/20   Belva Crome, MD  metroNIDAZOLE (METROGEL) 0.75 % gel metronidazole 0.75 % topical gel  APPLY ON THE SKIN DAILY FOR ROSACEA    [provider]  Multiple Vitamins-Calcium (ONE-A-DAY WOMENS PO) Take 1 tablet by mouth daily.    [provider]  olmesartan (BENICAR) 20 MG tablet Take 20 mg by mouth daily. 11/03/18   [provider]  Olopatadine HCl 0.2 % SOLN Apply 1 drop to eye daily as needed (itchy/watery eyes). 01/07/20   Garnet Sierras, DO  ondansetron (ZOFRAN ODT) 4 MG disintegrating tablet Take 1 tablet (4 mg total) by mouth every 8 (eight) hours as needed for nausea or vomiting. 08/19/17   Francine Graven, DO  predniSONE (DELTASONE) 50 MG tablet Take one tablet by mouth 13 hours, 7 hours, and 1 hour prior to CT 04/17/20   Belva Crome, MD  RABEprazole (ACIPHEX) 20 MG tablet Take 20 mg by mouth 2 (two) times daily.    [provider]    rizatriptan (MAXALT-MLT) 10 MG disintegrating tablet Take 10 mg by mouth every 2 (two) hours as needed for migraine.  06/18/15   [provider]  rosuvastatin (CRESTOR) 5 MG tablet Take 1 tablet (5 mg total) by mouth daily. 04/23/20 04/23/21  Belva Crome, MD  Vitamin D, Ergocalciferol, (DRISDOL) 50000 units CAPS capsule Take 50,000 Units by mouth every Wednesday.     [provider]    Allergies    Betadine [povidone iodine], Contrast media [iodinated diagnostic agents], Morphine and related, Prednisone, Yellow jacket venom [bee venom], Azithromycin, Erythromycin, Iohexol, Oxycontin [oxycodone],  Avelox [moxifloxacin hcl in nacl], Codeine, Gadolinium derivatives, Iodine, Lamisil [terbinafine], Levaquin [levofloxacin in d5w], Oxycodone-acetaminophen, Pantoprazole, Praluent [alirocumab], Shellfish allergy, Augmentin [amoxicillin-pot clavulanate], Dilaudid [hydromorphone hcl], Oxycodone hcl, and Tramadol  Review of Systems   Review of Systems  All other systems reviewed and are negative.   Physical Exam Updated Vital Signs There were no vitals taken for this visit.  Physical Exam Vitals and nursing note reviewed.  HENT:     Nose: Nose normal.  Cardiovascular:     Rate and Rhythm: Normal rate.  Neurological:     Mental Status: She is alert.     ED Results / Procedures / Treatments   Labs (all labs ordered are listed, but only abnormal results are displayed) Labs Reviewed  RESPIRATORY PANEL BY RT PCR (FLU A&B, COVID)    EKG None  Radiology No results found.  Procedures Procedures (including critical care time)  Medications Ordered in ED Medications - No data to display  ED Course  I have reviewed the triage vital signs and the nursing notes.  Pertinent labs & imaging results that were available during my care of the patient were reviewed by me and considered in my medical decision making (see chart for details).    MDM Rules/Calculators/A&P                           Covid testing done Final Clinical Impression(s) / ED Diagnoses Final diagnoses:  None    Rx / DC Orders ED Discharge Orders    None       Milton Ferguson, MD 05/22/20 1727    Milton Ferguson, MD 05/23/20 1003

## 2020-05-22 NOTE — ED Triage Notes (Signed)
Needs covid testing.

## 2020-05-28 NOTE — Progress Notes (Signed)
Subjective: 54 year old female presents the office today for same-day appointment given concerns of pain to her left big toe.  She said that she previously had a partial nail avulsion performed by Dr. Paulla Dolly and since then she has had ongoing chronic pain to the toenail and she is very upset with her nail in regards to pain as well as cosmetic appearance.  She has several questions about the procedure that was done in the reactions she had after the procedure.  She is very frustrated and upset today.  Denies any systemic complaints such as fevers, chills, nausea, vomiting. No acute changes since last appointment, and no other complaints at this time.   Objective: AAO x3, NAD DP/PT pulses palpable bilaterally, CRT less than 3 seconds On the medial border of the left hallux toenail there is splitting present with a loose piece of nail in the nail border.  I was able to break this today there is no drainage or pus or any edema, erythema or signs of infection.  Appears to the medial border the nail is somewhat dystrophic.  All the nails in general slight dryness to them as well as discoloration.  No pain the other nails today.  There is no signs of infection the left hallux toenail.  The proximal base of the left hallux toenail appears to be clear.  There is equal temperature gradient bilaterally and normal color change.  There is no mottling or any evidence of CRPS.  No pain with calf compression, swelling, warmth, erythema  Assessment: Left hallux onychodystrophy.  Plan: -All treatment options discussed with the patient including all alternatives, risks, complications.  -Other postoperative debride the symptomatic portion of ingrown toenail with any complications to remove the loose spicule of nail.  Also debrided and sent this for culture, pathology to [facility] labs.  -We did very long discussion today in regards to the toenail is and the issues that she has been having with the nail as well as other issues  and her other medical conditions.  Try to answer the questions that she had to the best my ability.  She states that when she left the office the toe felt better. -Patient encouraged to call the office with any questions, concerns, change in symptoms.   *The patient called and spoke to our office manager she was very upset about having to pay co-pay and therefore we did not charge her for today's visit.   Trula Slade DPM

## 2020-06-10 ENCOUNTER — Telehealth: Payer: Self-pay | Admitting: Podiatry

## 2020-06-10 ENCOUNTER — Ambulatory Visit (INDEPENDENT_AMBULATORY_CARE_PROVIDER_SITE_OTHER): Payer: PRIVATE HEALTH INSURANCE | Admitting: *Deleted

## 2020-06-10 ENCOUNTER — Other Ambulatory Visit: Payer: Self-pay

## 2020-06-10 DIAGNOSIS — Z91038 Other insect allergy status: Secondary | ICD-10-CM | POA: Diagnosis not present

## 2020-06-10 NOTE — Telephone Encounter (Signed)
Bako needed clarification if the test need PCR or PCF w/ Trebenifine (sp?) [id number]

## 2020-06-11 NOTE — Telephone Encounter (Signed)
Called and spoke with the [facility] representative (stephanie) and did the test for PCR w/o Terbinafine resistance reflex. [name] [name]

## 2020-06-11 NOTE — Telephone Encounter (Signed)
[name] [name] can you please call them? It was for nail fungus. Thanks.

## 2020-06-17 ENCOUNTER — Ambulatory Visit (INDEPENDENT_AMBULATORY_CARE_PROVIDER_SITE_OTHER): Payer: PRIVATE HEALTH INSURANCE

## 2020-06-17 ENCOUNTER — Other Ambulatory Visit: Payer: Self-pay

## 2020-06-17 DIAGNOSIS — Z91038 Other insect allergy status: Secondary | ICD-10-CM | POA: Diagnosis not present

## 2020-06-23 ENCOUNTER — Other Ambulatory Visit: Payer: Self-pay

## 2020-06-23 ENCOUNTER — Ambulatory Visit (INDEPENDENT_AMBULATORY_CARE_PROVIDER_SITE_OTHER): Payer: PRIVATE HEALTH INSURANCE

## 2020-06-23 DIAGNOSIS — Z91038 Other insect allergy status: Secondary | ICD-10-CM

## 2020-06-24 ENCOUNTER — Ambulatory Visit (INDEPENDENT_AMBULATORY_CARE_PROVIDER_SITE_OTHER): Payer: PRIVATE HEALTH INSURANCE | Admitting: Podiatry

## 2020-06-24 ENCOUNTER — Other Ambulatory Visit: Payer: Self-pay

## 2020-06-24 DIAGNOSIS — M7989 Other specified soft tissue disorders: Secondary | ICD-10-CM

## 2020-06-24 DIAGNOSIS — M79672 Pain in left foot: Secondary | ICD-10-CM

## 2020-06-24 DIAGNOSIS — L03032 Cellulitis of left toe: Secondary | ICD-10-CM

## 2020-06-30 NOTE — Progress Notes (Signed)
Subjective: 54 year old female presents the office today for follow evaluation after having nail culture performed as well as ingrown toenail left big toe.  She states that she was recently out of the country do a lot of walking she is having discomfort in the area denies any drainage.  She will get intermittent redness.  She previously been seen by Dr. Paulla Dolly and when she presented partial nail removal performed and has had continued discomfort on the medial aspect.  Currently denies any drainage or pus.  She also has a lipoma on her heel that causes discomfort.  She is interested in having this removed. Denies any systemic complaints such as fevers, chills, nausea, vomiting. No acute changes since last appointment, and no other complaints at this time.   Objective: AAO x3, NAD DP/PT pulses palpable bilaterally, CRT less than 3 seconds Incurvation present to medial aspect left hallux toenail with minimal tenderness in the distal medial portion of the nail where is growing in.  Localized edema there is no ascending cellulitis there is no drainage or pus or signs of infection.  Mild tenderness palpation of this area.  Also lipoma, soft tissue mass present along the medial heel with mild discomfort.  No open lesions. No open lesions or pre-ulcerative lesions.  No pain with calf compression, swelling, warmth, erythema  Assessment: Ingrown toenail left hallux, left heel lipoma  Plan: -All treatment options discussed with the patient including all alternatives, risks, complications.  -Ingrown toenail we discussed partial nail removal given.  However she is been having ongoing issues since initial procedure that she is unhappy with.  For now I treated conservative with soaking Epson salts, Neosporin and covering.  If symptoms continue or worsen will have to repeat a partial nail avulsion. -Reviewed nail culture results. -Regards to the soft tissue mass we discussed excision.  I for the ingrown toenail to  be resolved. -Patient encouraged to call the office with any questions, concerns, change in symptoms.   Trula Slade DPM

## 2020-07-01 ENCOUNTER — Other Ambulatory Visit: Payer: Self-pay

## 2020-07-01 ENCOUNTER — Ambulatory Visit (INDEPENDENT_AMBULATORY_CARE_PROVIDER_SITE_OTHER): Payer: PRIVATE HEALTH INSURANCE

## 2020-07-01 ENCOUNTER — Other Ambulatory Visit: Payer: PRIVATE HEALTH INSURANCE | Admitting: *Deleted

## 2020-07-01 ENCOUNTER — Other Ambulatory Visit: Payer: Self-pay | Admitting: *Deleted

## 2020-07-01 DIAGNOSIS — R072 Precordial pain: Secondary | ICD-10-CM

## 2020-07-01 DIAGNOSIS — E7801 Familial hypercholesterolemia: Secondary | ICD-10-CM

## 2020-07-01 DIAGNOSIS — Z91038 Other insect allergy status: Secondary | ICD-10-CM | POA: Diagnosis not present

## 2020-07-01 DIAGNOSIS — E882 Lipomatosis, not elsewhere classified: Secondary | ICD-10-CM

## 2020-07-01 DIAGNOSIS — I10 Essential (primary) hypertension: Secondary | ICD-10-CM

## 2020-07-01 NOTE — Addendum Note (Signed)
Addended by: Eulis Foster on: 07/01/2020 12:10 PM   Modules accepted: Orders

## 2020-07-02 LAB — C-REACTIVE PROTEIN: CRP: 5 mg/L (ref 0–10)

## 2020-07-02 LAB — NMR, LIPOPROFILE
Cholesterol, Total: 198 mg/dL (ref 100–199)
HDL Particle Number: 54.2 umol/L (ref >=30.5)
HDL-C: 65 mg/dL (ref >39)
LDL Particle Number: 1415 nmol/L — ABNORMAL HIGH (ref <1000)
LDL Size: 20.6 nm (ref >20.5)
LDL-C (NIH Calc): 99 mg/dL (ref 0–99)
LP-IR Score: 56 — ABNORMAL HIGH (ref <=45)
Small LDL Particle Number: 1015 nmol/L — ABNORMAL HIGH (ref <=527)
Triglycerides: 200 mg/dL — ABNORMAL HIGH (ref 0–149)

## 2020-07-07 ENCOUNTER — Telehealth: Payer: Self-pay | Admitting: Pharmacist

## 2020-07-07 ENCOUNTER — Ambulatory Visit (INDEPENDENT_AMBULATORY_CARE_PROVIDER_SITE_OTHER): Payer: PRIVATE HEALTH INSURANCE | Admitting: Allergy

## 2020-07-07 ENCOUNTER — Encounter: Payer: Self-pay | Admitting: Pharmacist

## 2020-07-07 ENCOUNTER — Encounter: Payer: Self-pay | Admitting: Allergy

## 2020-07-07 ENCOUNTER — Other Ambulatory Visit: Payer: Self-pay

## 2020-07-07 DIAGNOSIS — T7840XD Allergy, unspecified, subsequent encounter: Secondary | ICD-10-CM

## 2020-07-07 DIAGNOSIS — Z889 Allergy status to unspecified drugs, medicaments and biological substances status: Secondary | ICD-10-CM

## 2020-07-07 DIAGNOSIS — T781XXD Other adverse food reactions, not elsewhere classified, subsequent encounter: Secondary | ICD-10-CM

## 2020-07-07 DIAGNOSIS — J310 Chronic rhinitis: Secondary | ICD-10-CM | POA: Insufficient documentation

## 2020-07-07 DIAGNOSIS — Z91038 Other insect allergy status: Secondary | ICD-10-CM | POA: Diagnosis not present

## 2020-07-07 DIAGNOSIS — E7801 Familial hypercholesterolemia: Secondary | ICD-10-CM

## 2020-07-07 DIAGNOSIS — E882 Lipomatosis, not elsewhere classified: Secondary | ICD-10-CM

## 2020-07-07 DIAGNOSIS — J3089 Other allergic rhinitis: Secondary | ICD-10-CM

## 2020-07-07 MED ORDER — EZETIMIBE 10 MG PO TABS: 10.0000 mg | ORAL_TABLET | Freq: Every day | ORAL | 11 refills | Status: DC

## 2020-07-07 MED ORDER — ROSUVASTATIN CALCIUM 5 MG PO TABS: 5.0000 mg | ORAL_TABLET | Freq: Every day | ORAL | 3 refills | Status: DC

## 2020-07-07 NOTE — Assessment & Plan Note (Signed)
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  Continue follow up with dermatology, neurology, cardiology.  [facility] appointment.

## 2020-07-07 NOTE — Assessment & Plan Note (Signed)
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 Shrimp caused perioral pruritus in the past. Interim history - reaction to scallops and possibly red dye. Tolerates other mollusks and shellfish.   Continue to avoid scallops and red dye.  Get bloodwork.  For mild symptoms you can take over the counter antihistamines such as Benadryl and monitor symptoms closely. If symptoms worsen or if you have severe symptoms including breathing issues, throat closure, significant swelling, whole body hives, severe diarrhea and vomiting, lightheadedness then inject epinephrine and seek immediate medical care afterwards.

## 2020-07-07 NOTE — Assessment & Plan Note (Signed)
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 Patient was on AIT for 3 years but the last year developed large localized reactions so she stopped the injections. She believes they were helping her symptoms. 2020 immunocap borderline positive to few pollens. Azelastine not effective.  Interim history - still having PND and nasal congestion. Worse after meals.   May use Atrovent nasal spray - 1 to 2 sprays per nostril up to three times a day as needed for drainage.  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   Recommend ENT evaluation for the congestion.  Patient does not take any steroid nasal sprays.   Continue Xyzal 5mg  daily at night.   May use olopatadine eye drops 0.2% once a day as needed for itchy/watery eyes  Continue environmental control measures to pollen.

## 2020-07-07 NOTE — Telephone Encounter (Addendum)
Called pt and left message to discuss lipid panel.  NMR panel relatively unchanged except for improvement in TG. During last conversation, pt increased frequency of rosuvastatin from 5mg  every other day to daily dosing. LDL goal < 70 due to fam hx of CAD and pt's elevated Lp(a).  Will confirm that pt has increased frequency of rosuvastatin. She was previously intolerant to 10mg  daily dosing, pravastatin 20mg  daily, and Praluent 75mg  Q2W. Can discuss adding ezetimibe or Nexletol (would avoid combo product for now due to pt's extensive allergy list).

## 2020-07-07 NOTE — Assessment & Plan Note (Signed)
Past history - Concern regarding hymenoptera allergy as she developed some respiratory symptoms after getting stung by a yellow jacket. Hymenoptera panel was positive to honeybee, white faced hornet, yellow jacket, wasp, yellow hornet and bumblebee and fire ant. Interim history - no more symptoms after hymenoptera injections.   Continue mixed vespid injections (contains yellow jacket, yellow hornet and white faced hornet) - schedule A slow build up.   Make sure you take your allergy medication the day of injection.  If you do well with the build up then will add on honey bee and wasp together, then add on fire ant.   Carry Epipen and use for anaphylactic reactions as needed.

## 2020-07-07 NOTE — Patient Instructions (Addendum)
Allergic reaction  Continue to avoid scallops and red dye.  Continuexyzal 5mg  at night.  Keep track of reactions. ? If you have a reaction next time, please get tryptase level drawn within 2-3 hours. This will help in identifying if it's truly an allergic reaction or some other type of adverse reaction that you are having.   For mild symptoms you can take over the counter antihistamines such as Benadryl and monitor symptoms closely. If symptoms worsen or if you have severe symptoms including breathing issues, throat closure, significant swelling, whole body hives, severe diarrhea and vomiting, lightheadedness then inject epinephrine and seek immediate medical care afterwards.  Emergency action plan in place.   Hymenoptera allergy  Continue mixed vespid injections (contains yellow jacket, yellow hornet and white faced hornet). ? Make sure you take your allergy medication the day of injection. ? If you do well with the build up then will add on honey bee and wasp together, then add on fire ant.   Carry Epipen and use for anaphylactic reactions as needed.   Other allergic rhinitis  2020 immunocap borderline positive to few pollens.   May use Atrovent nasal spray - 1 to 2 sprays per nostril up to three times a day as needed for drainage.  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   Recommend ENT evaluation for the congestion.  Continue xyzal 5mg  daily at night.   May use olopatadine eye drops 0.2% once a day as needed for itchy/watery eyes  Continue environmental control measures to pollen.  Multiple drug allergies  Continue to avoid medications on allergy list.    Adverse food reaction  Continue to avoid scallops and red dye.  Get bloodwork:  We are ordering labs, so please allow 1-2 weeks for the results to come back. With the newly implemented Cures Act, the labs might be visible to you at the same time that they become visible to me. However,  I will not address the results until all of the results are back, so please be patient.  In the meantime, continue recommendations in your patient instructions, including avoidance measures (if applicable), until you hear from me.  For mild symptoms you can take over the counter antihistamines such as Benadryl and monitor symptoms closely. If symptoms worsen or if you have severe symptoms including breathing issues, throat closure, significant swelling, whole body hives, severe diarrhea and vomiting, lightheadedness then inject epinephrine and seek immediate medical care afterwards.  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  [facility] appointment.   Return in about 6 months or sooner if needed.

## 2020-07-07 NOTE — Progress Notes (Signed)
RE: [name] [name] MRN: [id number] DOB: Mar 08, 1966 Date of Telemedicine Visit: 07/07/2020  Referring provider: Janus Molder, NP Primary care provider: Janus Molder, NP  Chief Complaint: Allergic Reaction (Patient gave verbal consent to treat and bill insurance for this visit.)   Telemedicine Follow Up Visit via Telephone: I connected with Annick Dimaio for a follow up on 07/07/20 by telephone and verified that I am speaking with the correct person using two identifiers.   I discussed the limitations, risks, security and privacy concerns of performing an evaluation and management service by telephone and the availability of in person appointments. I also discussed with the patient that there may be a patient responsible charge related to this service. The patient expressed understanding and agreed to proceed.  Patient is at home/work. Provider is at the office.  Visit start time: 10:12AM Visit end time: 10:42AM Insurance consent/check in by: front desk Medical consent and medical assistant/nurse: Aileen Pilot.  History of Present Illness: She is a 54 y.o. 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 Her previous allergy office visit was on 01/07/2020 with Dr. Maudie Mercury via telemedicine. Today is a new complaint visit of Red dye allergy.  Allergic reaction 3 weeks ago patient was in [place] and had bread which had some pistachios with red dye coating. Patient developed lip swelling, heart racing, dizziness, itchy face. Patient took benadryl and symptoms resolved within 30 minutes.  Patient had another reaction to scallops while on this trip as well. Tolerates other mollusks and shellfish with no issues.   5-6 months ago patient had a bland dinner with chicken and had anaphylactic reaction for which her husband administered Epipen at home.  Not sure what exactly  triggered this episode. Ate chicken since then with no issues.   Currently avoiding food dyes and scallops.  Hymenoptera allergy Tolerating venom injection with no issues. No stings since the last visit.   Other allergic rhinitis Using Atrovent daily and not sure if it's helping.  Still having PND, nasal congestion. Worse rhinorrhea after meals.  Patient has deviated septum.  Can't take steroid nasal sprays.  Taking Xyzal daily.   Assessment and Plan: Kyasia is a 54 y.o. female with: Allergic reaction Past history - 54 year old female who presents with a myriad of symptoms but concerned about increasing reactions to various things including foods, chemicals, environment, drugs since her diagnosis of Dercum's disease. Bloodwork - hymenoptera panel was positive to honeybee, white faced hornet, yellow jacket, wasp, yellow hornet and bumblebee and fire ant. Shellfish panel and seafood panel were all negative. The scallop and oyster were borderline positive. Tryptase, blood count, urticaria index, and alpha gal were all normal. 2,3 Dinor-11Beta-Prostaglandin F2 Alpha, Urine - 2157pg/mg Cr (ref <5205pg/mg Cr) - normal.  Interim history - 1 reaction at home for which self-administered Epipen. No triggers noted. 2 reactions while traveling aboard - maybe scallops and red dye was the trigger. Resolved with Benadryl.   ContinueXyzal 65m at night.  Keep track of reactions. ? If you have a reaction next time, please get tryptase level drawn within 2-3 hours. This will help in identifying if it's truly an allergic reaction or some other type of adverse reaction that you are having.   For mild symptoms you can take over the counter antihistamines such as Benadryl and monitor symptoms closely. If symptoms worsen or if you have severe symptoms including breathing issues, throat closure,  significant swelling, whole body hives, severe diarrhea and vomiting, lightheadedness then inject epinephrine and  seek immediate medical care afterwards.  Emergency action plan in place.   Continue to avoid scallops and red dye. Get bloodwork.   Hymenoptera allergy Past history - Concern regarding hymenoptera allergy as she developed some respiratory symptoms after getting stung by a yellow jacket. Hymenoptera panel was positive to honeybee, white faced hornet, yellow jacket, wasp, yellow hornet and bumblebee and fire ant. Interim history - no more symptoms after hymenoptera injections.   Continue mixed vespid injections (contains yellow jacket, yellow hornet and white faced hornet) - schedule A slow build up.   Make sure you take your allergy medication the day of injection.  If you do well with the build up then will add on honey bee and wasp together, then add on fire ant.   Carry Epipen and use for anaphylactic reactions as needed.   Other allergic rhinitis Past history - Perennial rhino conjunctivitis symptoms for many years with worsening in the fall. Patient was on AIT for 3 years but the last year developed large localized reactions so she stopped the injections. She believes they were helping her symptoms. 2020 immunocap borderline positive to few pollens. Azelastine not effective.  Interim history - still having PND and nasal congestion. Worse after meals.   May use Atrovent nasal spray - 1 to 2 sprays per nostril up to three times a day as needed for drainage.  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   Recommend ENT evaluation for the congestion.  Patient does not take any steroid nasal sprays.   Continue Xyzal 36m daily at night.   May use olopatadine eye drops 0.2% once a day as needed for itchy/watery eyes  Continue environmental control measures to pollen.  Multiple drug allergies Past history - Patient is sensitive to various medications. Some of the reactions are more adverse drug reactions rather then IgE mediated reactions.  Continue to avoid  medications which gave her issues in the past. See allergy list.   Adverse food reaction Past history - Currently avoiding gluten and spicy foods as they seem to trigger her Dercum's disease symptoms. Shrimp caused perioral pruritus in the past. Interim history - reaction to scallops and possibly red dye. Tolerates other mollusks and shellfish.   Continue to avoid scallops and red dye.  Get bloodwork.  For mild symptoms you can take over the counter antihistamines such as Benadryl and monitor symptoms closely. If symptoms worsen or if you have severe symptoms including breathing issues, throat closure, significant swelling, whole body hives, severe diarrhea and vomiting, lightheadedness then inject epinephrine and seek immediate medical care afterwards.  Dercum disease Past history - [facility]  Continue follow up with dermatology, neurology, cardiology.  [facility]   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  Take prior to meals.  Return in about 6 months (around 01/05/2021).  Lab Orders     Allergen, Red (Carmine) Dye, [id number]     Allergen Profile, Shellfish  Diagnostics: None.  Medication List:  Current Outpatient Medications  Medication Sig Dispense Refill  . Alirocumab 75 MG/ML SOAJ INJECT THE CONTENTS OF 1 PEN INTO THE SKIN EVERY 14 DAYS    . aspirin EC 81 MG tablet Take 1 tablet (81 mg total) by  mouth daily. 90 tablet 3  . clobetasol (TEMOVATE) 0.05 % external solution clobetasol 0.05 % scalp solution  APPLY TO THE SCALP DAILY AS NEEDED FOR ITCH    . EPINEPHrine (EPIPEN 2-PAK) 0.3 mg/0.3 mL IJ SOAJ injection Inject 0.3 mLs (0.3 mg total) into the muscle as needed. 2 each 2  . estradiol (ESTRACE) 1 MG tablet     . furosemide (LASIX) 20 MG tablet Take 1 tablet (20 mg total) by mouth daily as needed for fluid or  edema. 30 tablet 3  . ketoconazole (NIZORAL) 2 % shampoo     . levocetirizine (XYZAL) 5 MG tablet Take 5 mg by mouth every evening.    . metroNIDAZOLE (METROGEL) 0.75 % gel metronidazole 0.75 % topical gel  APPLY ON THE SKIN DAILY FOR ROSACEA    . Multiple Vitamins-Calcium (ONE-A-DAY WOMENS PO) Take 1 tablet by mouth daily.    Marland Kitchen olmesartan (BENICAR) 20 MG tablet Take 20 mg by mouth daily.    . ondansetron (ZOFRAN ODT) 4 MG disintegrating tablet Take 1 tablet (4 mg total) by mouth every 8 (eight) hours as needed for nausea or vomiting. 6 tablet 0  . RABEprazole (ACIPHEX) 20 MG tablet Take 20 mg by mouth 2 (two) times daily.    . rizatriptan (MAXALT-MLT) 10 MG disintegrating tablet Take 10 mg by mouth every 2 (two) hours as needed for migraine.   11  . rosuvastatin (CRESTOR) 5 MG tablet Take 1 tablet (5 mg total) by mouth daily. 30 tablet 11  . Vitamin D, Ergocalciferol, (DRISDOL) 50000 units CAPS capsule Take 50,000 Units by mouth every Wednesday.     Marland Kitchen ipratropium (ATROVENT) 0.03 % nasal spray Place 1-2 sprays into both nostrils 3 (three) times daily as needed for rhinitis. (Patient not taking: Reported on 07/07/2020) 30 mL 5  . metoprolol tartrate (LOPRESSOR) 100 MG tablet Take one tablet by mouth 2 hours prior to your CT (Patient not taking: Reported on 07/07/2020) 1 tablet 0  . Olopatadine HCl 0.2 % SOLN Apply 1 drop to eye daily as needed (itchy/watery eyes). (Patient not taking: Reported on 07/07/2020) 2.5 mL 5   No current facility-administered medications for this visit.   Allergies: Allergies  Allergen Reactions  . Betadine [Povidone Iodine]     Throat swelling, itchy lips, redness/swelling at application site.  . Contrast Media [Iodinated Diagnostic Agents] Anaphylaxis  . Morphine And Related Anaphylaxis and Nausea And Vomiting  . Prednisone Anaphylaxis  . Yellow Jacket Venom [Bee Venom] Anaphylaxis  . Azithromycin Other (See Comments)    SEVERE STOMACH PAIN   . Erythromycin  Nausea And Vomiting and Other (See Comments)    SEVERE STOMACH PAIN/abdominal pain   . Iohexol Hives, Itching and Swelling    Swelling of upper lip, thick tongue, hives on face and back and itching all over  . Oxycontin [Oxycodone] Nausea And Vomiting  . Avelox [Moxifloxacin Hcl In Nacl] Swelling  . Codeine Nausea And Vomiting and Other (See Comments)    Headaches also  . Gadolinium Derivatives Hives, Itching and Swelling  . Iodine   . Lamisil [Terbinafine]     Pt stated, "It made me feel like I had the flu"  . Levaquin [Levofloxacin In D5w] Swelling  . Oxycodone-Acetaminophen Nausea And Vomiting  . Pantoprazole Other (See Comments)    "Feels like I have the flu"  . Praluent [Alirocumab]     Swelling in leg, pain, dizziness, tightness in her throat  . Shellfish Allergy   . Augmentin [Amoxicillin-Pot  Clavulanate] Nausea And Vomiting  . Dilaudid [Hydromorphone Hcl] Nausea And Vomiting  . Oxycodone Hcl Nausea And Vomiting  . Tramadol Nausea Only   I reviewed her past medical history, social history, family history, and environmental history and no significant changes have been reported from her previous visit.  Review of Systems  Constitutional: Negative for appetite change, chills and fever.  HENT: Positive for postnasal drip and rhinorrhea. Negative for congestion.   Eyes: Positive for itching.  Respiratory: Negative for chest tightness, shortness of breath and wheezing.   Cardiovascular: Negative for chest pain.  Gastrointestinal: Negative for abdominal pain.  Genitourinary: Negative for difficulty urinating.  Skin: Negative for rash.  Allergic/Immunologic: Positive for environmental allergies.   Objective: Physical Exam Not obtained as encounter was done via telephone.   Previous notes and tests were reviewed.  I discussed the assessment and treatment plan with the patient. The patient was provided an opportunity to ask questions and all were answered. The patient agreed  with the plan and demonstrated an understanding of the instructions. After visit summary/patient instructions available via mychart.   The patient was advised to call back or seek an in-person evaluation if the symptoms worsen or if the condition fails to improve as anticipated.  I provided 30 minutes of non-face-to-face time during this encounter.  It was my pleasure to participate in Kiel Roskelley's care today. Please feel free to contact me with any questions or concerns.   Sincerely,  [name], DO Allergy & Immunology  [facility] office: [place] office: [phone]

## 2020-07-07 NOTE — Telephone Encounter (Signed)
Called pt and left message to discuss lipid panel.  NMR panel relatively unchanged except for improvement in TG. During last conversation, pt increased frequency of rosuvastatin from 5mg  every other day to daily dosing. LDL goal < 70 due to fam hx of CAD and pt's elevated Lp(a).  Will confirm that pt has increased frequency of rosuvastatin. She was previously intolerant to 10mg  daily dosing, pravastatin 20mg  daily, and Praluent 75mg  Q2W. Can discuss adding ezetimibe or Nexletol (would avoid combo product for now due to pt's extensive allergy list).

## 2020-07-07 NOTE — Assessment & Plan Note (Signed)
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 Bloodwork - hymenoptera panel was positive to honeybee, white faced hornet, yellow jacket, wasp, yellow hornet and bumblebee and fire ant. Shellfish panel and seafood panel were all negative. The scallop and oyster were borderline positive. Tryptase, blood count, urticaria index, and alpha gal were all normal. 2,3 Dinor-11Beta-Prostaglandin F2 Alpha, Urine - 2157pg/mg Cr (ref <5205pg/mg Cr) - normal.  Interim history - 1 reaction at home for which self-administered Epipen. No triggers noted. 2 reactions while traveling aboard - maybe scallops and red dye was the trigger. Resolved with Benadryl.   ContinueXyzal 2m at night.  Keep track of reactions. ? If you have a reaction next time, please get tryptase level drawn within 2-3 hours. This will help in identifying if it's truly an allergic reaction or some other type of adverse reaction that you are having.   For mild symptoms you can take over the counter antihistamines such as Benadryl and monitor symptoms closely. If symptoms worsen or if you have severe symptoms including breathing issues, throat closure, significant swelling, whole body hives, severe diarrhea and vomiting, lightheadedness then inject epinephrine and seek immediate medical care afterwards.  Emergency action plan in place.   Continue to avoid scallops and red dye. Get bloodwork.

## 2020-07-07 NOTE — Assessment & Plan Note (Signed)
   May use Atrovent nasal spray - 1 to 2 sprays per nostril up to three times a day as needed for drainage.  Take prior to meals.

## 2020-07-07 NOTE — Assessment & Plan Note (Signed)
Past history - Patient is sensitive to various medications. Some of the reactions are more adverse drug reactions rather then IgE mediated reactions.  Continue to avoid medications which gave her issues in the past. See allergy list.

## 2020-07-08 NOTE — Telephone Encounter (Signed)
This encounter was created in error - please disregard.

## 2020-07-08 NOTE — Telephone Encounter (Addendum)
Spoke with pt about lab results. NMR panel reviewed with pt in detail. Pt is walking 4 miles and swimming on a regular basis, also eating a heart healthy diet. 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   She is agreeable to adding ezetimibe 10mg  daily. Will continue rosuvastatin 5mg  daily since pt is previously intolerant to 10mg  dosing. Will recheck labs in 2-3 months and add on Lp(a) per pt request (historically elevated but surprisingly in the normal range on her last check a few months ago). If she tolerates ezetimibe well and LDL remains elevated, will change to Nexlizet at that time.  Pt also wanted to make Dr [name] aware that her father had an abdominal aneurysm in case she needed any screening for this.  25 minutes spent with pt on the phone discussing her results.

## 2020-07-09 ENCOUNTER — Ambulatory Visit: Payer: Self-pay

## 2020-07-10 ENCOUNTER — Ambulatory Visit (INDEPENDENT_AMBULATORY_CARE_PROVIDER_SITE_OTHER): Payer: PRIVATE HEALTH INSURANCE

## 2020-07-10 ENCOUNTER — Other Ambulatory Visit: Payer: Self-pay

## 2020-07-10 DIAGNOSIS — Z91038 Other insect allergy status: Secondary | ICD-10-CM | POA: Diagnosis not present

## 2020-07-10 DIAGNOSIS — T7840XD Allergy, unspecified, subsequent encounter: Secondary | ICD-10-CM

## 2020-07-10 MED ORDER — EPINEPHRINE (ANAPHYLAXIS) 1 MG/ML IJ SOLN
0.3000 mg | Freq: Once | INTRAMUSCULAR | Status: AC
  Administered 2020-07-10: 0.3 mg via INTRAMUSCULAR

## 2020-07-10 NOTE — Progress Notes (Signed)
d 

## 2020-07-11 ENCOUNTER — Telehealth: Payer: Self-pay | Admitting: *Deleted

## 2020-07-11 NOTE — Telephone Encounter (Signed)
Patient had a reaction to her venom injection 07/10/2020. She received .15 of 0.003 Mixed Vespid. After waiting in her car she came back in stating that she was having difficulty breathing. She was taken to a room and evaluated and given 0.30mg  of Epinephrine. I called the patient to see how she was doing yesterday evening 07/10/20 at around 6:00pm. She stated that she was doing good and was out eating at a restaurant eating dinner with her husband and was feeling fine. I advised to please contact us if she needs anything and that I would be in touch. I called the patient this afternoon at around 12:15pm to see how she was doing. She stated that she was feeling fine she was just tired from receiving Epinephrine yesterday. She stated that at the injection site for the venom she received yesterday it was red and slightly raised and the size of a 50 cent piece. She did state that she also had a red mark on her leg from the injection of epinephrine but that it was not bothering her. She stated that she was feeling fine and not having any issues. I advised to please call our office to speak to the on call provider during the weekend if she needs anything. Patient verbalized understanding. Please advise the best course of action in regards to her venom injection.

## 2020-07-12 ENCOUNTER — Encounter: Payer: Self-pay | Admitting: Interventional Cardiology

## 2020-07-14 NOTE — Telephone Encounter (Signed)
Called and left voicemail message for patient to call the office.  Patient needs to make a televisit with Dr. Maudie Mercury to discuss venom injections and next dosing.

## 2020-07-14 NOTE — Telephone Encounter (Signed)
Please call patient. I need to have a televisit with her to discuss continuing her injections.   Thank you.

## 2020-07-15 ENCOUNTER — Ambulatory Visit: Payer: PRIVATE HEALTH INSURANCE | Admitting: Allergy

## 2020-07-16 NOTE — Progress Notes (Signed)
RE: [name] [name] MRN: [id number] DOB: 04/10/1966 Date of Telemedicine Visit: 07/17/2020  Referring provider: Janus Molder, NP Primary care provider: Janus Molder, NP  Chief Complaint: Allergic Reaction   Telemedicine Follow Up Visit via Telephone: I connected with [name] for a follow up on 07/17/20 by telephone and verified that I am speaking with the correct person using two identifiers.   I discussed the limitations, risks, security and privacy concerns of performing an evaluation and management service by telephone and the availability of in person appointments. I also discussed with the patient that there may be a patient responsible charge related to this service. The patient expressed understanding and agreed to proceed.  Patient is in her car. Provider is at the office.  Visit start time: 4:12PM Visit end time: 4:23PM Insurance consent/check in by: front desk Medical consent and medical assistant/nurse: Aileen Pilot.  History of Present Illness: She is a 54 y.o. 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 Her previous allergy office visit was on 07/07/2020 with Dr. Maudie Mercury. Today is a new complaint visit of Reaction after hymenoptera immunotherapy.  Patient had her venom injection on 07/10/20. While she was waiting in her car, she felt strange feeling in her mouth, really heavy in her chest, dizzy, tightness in the back of her throat.  She came in the office and was treated with epinephrine. She felt much better after 10-15 minutes afterwards. This was the first time she had epinephrine after hymenoptera injection.  She had used her Epipen for other allergic reactions.   She didn't do anything different that day but took her Xyzal very early in the morning as she woke up early that day.  She would like to continue her  injections as she is an avid gardener and is worried about stings while she is outdoors.   07/11/20 telephone encounter:  "Patient had a reaction to her venom injection 07/10/2020. She received .15 of 0.003 Mixed Vespid. After waiting in her car she came back in stating that she was having difficulty breathing. She was taken to a room and evaluated and given 0.30mg  of Epinephrine. I called the patient to see how she was doing yesterday evening 07/10/20 at around 6:00pm. She stated that she was doing good and was out eating at a restaurant eating dinner with her husband and was feeling fine. I advised to please contact us if she needs anything and that I would be in touch. I called the patient this afternoon at around 12:15pm to see how she was doing. She stated that she was feeling fine she was just tired from receiving Epinephrine yesterday. She stated that at the injection site for the venom she received yesterday it was red and slightly raised and the size of a 50 cent piece. She did state that she also had a red mark on her leg from the injection of epinephrine but that it was not bothering her. She stated that she was feeling fine and not having any issues. I advised to please call our office to speak to the on call provider during the weekend if she needs anything. Patient verbalized understanding. Please advise the best course of action in regards to her venom injection."  Component     Latest Ref Rng & Units 05/18/2019  Honeybee IgE     Class II kU/L 0.81 (A)  Hornet, White Face, IgE  Class III kU/L 2.65 (A)  Yellow Jacket, IgE     Class IV kU/L 7.71 (A)  Paper Wasp IgE     Class III kU/L 3.34 (A)  Hornet, Yellow, IgE     Class III kU/L 1.91 (A)  Bumblebee     Class 0/I kU/L 0.26 (A)    Assessment and Plan: Elyssia is a 54 y.o. female with: Hymenoptera allergy Past history - Concern regarding hymenoptera allergy as she developed some respiratory symptoms after getting stung by a  yellow jacket. Hymenoptera panel was positive to honeybee, white faced hornet, yellow jacket, wasp, yellow hornet and bumblebee. Borderline to fire ant. Interim history - chest tightness, dizziness, throat discomfort after 0.57mL of mixed vespid 0.003 given. Treated with epinephrine and symptoms improved immediately. Patient would like to continue injections as she is an avid gardener.   Continue mixed vespid injections (contains yellow jacket, yellow hornet and white faced hornet).  Will go back to 0.81mL of the current vial (mixed vespid 0.003) ? Make sure you take your allergy medication the day of injection. ? If you do well with the build up then will add on honey bee and wasp together next.   Carry Epipen and use for anaphylactic reactions as needed.   Return in about 6 months (around 01/15/2021).  Diagnostics: None.  Medication List:  Current Outpatient Medications  Medication Sig Dispense Refill  . aspirin EC 81 MG tablet Take 1 tablet (81 mg total) by mouth daily. 90 tablet 3  . clobetasol (TEMOVATE) 0.05 % external solution clobetasol 0.05 % scalp solution  APPLY TO THE SCALP DAILY AS NEEDED FOR ITCH    . EPINEPHrine (EPIPEN 2-PAK) 0.3 mg/0.3 mL IJ SOAJ injection Inject 0.3 mLs (0.3 mg total) into the muscle as needed. 2 each 2  . estradiol (ESTRACE) 1 MG tablet     . ezetimibe (ZETIA) 10 MG tablet Take 1 tablet (10 mg total) by mouth daily. 30 tablet 11  . furosemide (LASIX) 20 MG tablet Take 1 tablet (20 mg total) by mouth daily as needed for fluid or edema. 30 tablet 3  . ipratropium (ATROVENT) 0.03 % nasal spray Place 1-2 sprays into both nostrils 3 (three) times daily as needed for rhinitis. 30 mL 5  . ketoconazole (NIZORAL) 2 % shampoo     . levocetirizine (XYZAL) 5 MG tablet Take 5 mg by mouth every evening.    . metoprolol tartrate (LOPRESSOR) 100 MG tablet Take one tablet by mouth 2 hours prior to your CT 1 tablet 0  . metroNIDAZOLE (METROGEL) 0.75 % gel metronidazole  0.75 % topical gel  APPLY ON THE SKIN DAILY FOR ROSACEA    . Multiple Vitamins-Calcium (ONE-A-DAY WOMENS PO) Take 1 tablet by mouth daily.    Marland Kitchen olmesartan (BENICAR) 20 MG tablet Take 20 mg by mouth daily.    . Olopatadine HCl 0.2 % SOLN Apply 1 drop to eye daily as needed (itchy/watery eyes). 2.5 mL 5  . ondansetron (ZOFRAN ODT) 4 MG disintegrating tablet Take 1 tablet (4 mg total) by mouth every 8 (eight) hours as needed for nausea or vomiting. 6 tablet 0  . RABEprazole (ACIPHEX) 20 MG tablet Take 20 mg by mouth 2 (two) times daily.    . rizatriptan (MAXALT-MLT) 10 MG disintegrating tablet Take 10 mg by mouth every 2 (two) hours as needed for migraine.   11  . rosuvastatin (CRESTOR) 5 MG tablet Take 1 tablet (5 mg total) by mouth daily. 90 tablet 3  .  Vitamin D, Ergocalciferol, (DRISDOL) 50000 units CAPS capsule Take 50,000 Units by mouth every Wednesday.      No current facility-administered medications for this visit.   Allergies: Allergies  Allergen Reactions  . Betadine [Povidone Iodine]     Throat swelling, itchy lips, redness/swelling at application site.  . Contrast Media [Iodinated Diagnostic Agents] Anaphylaxis  . Morphine And Related Anaphylaxis and Nausea And Vomiting  . Prednisone Anaphylaxis  . Yellow Jacket Venom [Bee Venom] Anaphylaxis  . Azithromycin Other (See Comments)    SEVERE STOMACH PAIN   . Erythromycin Nausea And Vomiting and Other (See Comments)    SEVERE STOMACH PAIN/abdominal pain   . Iohexol Hives, Itching and Swelling    Swelling of upper lip, thick tongue, hives on face and back and itching all over  . Oxycontin [Oxycodone] Nausea And Vomiting  . Avelox [Moxifloxacin Hcl In Nacl] Swelling  . Codeine Nausea And Vomiting and Other (See Comments)    Headaches also  . Gadolinium Derivatives Hives, Itching and Swelling  . Iodine   . Lamisil [Terbinafine]     Pt stated, "It made me feel like I had the flu"  . Levaquin [Levofloxacin In D5w] Swelling  .  Oxycodone-Acetaminophen Nausea And Vomiting  . Pantoprazole Other (See Comments)    "Feels like I have the flu"  . Praluent [Alirocumab]     Swelling in leg, pain, dizziness, tightness in her throat  . Pravastatin     myalgias  . Rosuvastatin     Myalgias and cramps, sore throat, indigestion  . Shellfish Allergy   . Augmentin [Amoxicillin-Pot Clavulanate] Nausea And Vomiting  . Dilaudid [Hydromorphone Hcl] Nausea And Vomiting  . Oxycodone Hcl Nausea And Vomiting  . Tramadol Nausea Only   I reviewed her past medical history, social history, family history, and environmental history and no significant changes have been reported from her previous visit.  Review of Systems  Constitutional: Negative for appetite change, chills and fever.  Respiratory: Positive for chest tightness. Negative for shortness of breath and wheezing.   Skin: Negative for rash.  Allergic/Immunologic: Positive for environmental allergies.   Objective: Physical Exam Not obtained as encounter was done via telephone.   Previous notes and tests were reviewed.  I discussed the assessment and treatment plan with the patient. The patient was provided an opportunity to ask questions and all were answered. The patient agreed with the plan and demonstrated an understanding of the instructions. After visit summary/patient instructions available via mychart.   The patient was advised to call back or seek an in-person evaluation if the symptoms worsen or if the condition fails to improve as anticipated.  I provided 11 minutes of non-face-to-face time during this encounter.  It was my pleasure to participate in Brandywine Quinby's care today. Please feel free to contact me with any questions or concerns.   Sincerely,  [name], DO Allergy & Immunology  [facility] office: [place] office: [phone]

## 2020-07-17 ENCOUNTER — Encounter: Payer: Self-pay | Admitting: Allergy

## 2020-07-17 ENCOUNTER — Ambulatory Visit (INDEPENDENT_AMBULATORY_CARE_PROVIDER_SITE_OTHER): Payer: PRIVATE HEALTH INSURANCE | Admitting: Allergy

## 2020-07-17 ENCOUNTER — Other Ambulatory Visit: Payer: Self-pay

## 2020-07-17 DIAGNOSIS — Z889 Allergy status to unspecified drugs, medicaments and biological substances status: Secondary | ICD-10-CM | POA: Diagnosis not present

## 2020-07-17 DIAGNOSIS — T781XXD Other adverse food reactions, not elsewhere classified, subsequent encounter: Secondary | ICD-10-CM

## 2020-07-17 DIAGNOSIS — J3089 Other allergic rhinitis: Secondary | ICD-10-CM | POA: Diagnosis not present

## 2020-07-17 DIAGNOSIS — T7840XD Allergy, unspecified, subsequent encounter: Secondary | ICD-10-CM

## 2020-07-17 DIAGNOSIS — J310 Chronic rhinitis: Secondary | ICD-10-CM

## 2020-07-17 DIAGNOSIS — Z91038 Other insect allergy status: Secondary | ICD-10-CM

## 2020-07-17 DIAGNOSIS — E882 Lipomatosis, not elsewhere classified: Secondary | ICD-10-CM

## 2020-07-17 NOTE — Assessment & Plan Note (Signed)
Past history - Concern regarding hymenoptera allergy as she developed some respiratory symptoms after getting stung by a yellow jacket. Hymenoptera panel was positive to honeybee, white faced hornet, yellow jacket, wasp, yellow hornet and bumblebee. Borderline to fire ant. Interim history - chest tightness, dizziness, throat discomfort after 0.61mL of mixed vespid 0.003 given. Treated with epinephrine and symptoms improved immediately. Patient would like to continue injections as she is an avid gardener.   Continue mixed vespid injections (contains yellow jacket, yellow hornet and white faced hornet).  Will go back to 0.88mL of the current vial (mixed vespid 0.003) ? Make sure you take your allergy medication the day of injection. ? If you do well with the build up then will add on honey bee and wasp together next.   Carry Epipen and use for anaphylactic reactions as needed.

## 2020-07-17 NOTE — Patient Instructions (Addendum)
Hymenoptera allergy  Continue mixed vespid injections (contains yellow jacket, yellow hornet and white faced hornet).  Will go back to 0.84mL of the current vial.  ? Make sure you take your allergy medication the day of injection. ? If you do well with the build up then will add on honey bee and wasp together.  Carry Epipen and use for anaphylactic reactions as needed.   Return in about 6 months or sooner if needed.

## 2020-07-18 ENCOUNTER — Ambulatory Visit (INDEPENDENT_AMBULATORY_CARE_PROVIDER_SITE_OTHER): Payer: PRIVATE HEALTH INSURANCE | Admitting: *Deleted

## 2020-07-18 DIAGNOSIS — Z91038 Other insect allergy status: Secondary | ICD-10-CM | POA: Diagnosis not present

## 2020-07-23 LAB — ALLERGEN PROFILE, SHELLFISH
Clam IgE: <0.1 kU/L
F023-IgE Crab: <0.1 kU/L
F080-IgE Lobster: <0.1 kU/L
F290-IgE Oyster: <0.1 kU/L
Scallop IgE: <0.1 kU/L
Shrimp IgE: <0.1 kU/L

## 2020-07-23 LAB — ALLERGEN, RED (CARMINE) DYE, RF340: F340-IgE Carmine Red Dye: <0.1 kU/L

## 2020-07-24 ENCOUNTER — Ambulatory Visit: Payer: Self-pay

## 2020-08-05 ENCOUNTER — Ambulatory Visit: Payer: Self-pay

## 2020-08-07 ENCOUNTER — Encounter: Payer: Self-pay | Admitting: Pharmacist

## 2020-08-07 ENCOUNTER — Telehealth: Payer: Self-pay | Admitting: Pharmacist

## 2020-08-07 NOTE — Telephone Encounter (Signed)
Called pt, she has only been taking ezetimibe for a week total but is tolerating it ok. Advised her to continue on ezetimibe and rosuvastatin 5mg  daily. If she is still tolerating ezetimibe well in another few weeks, she can try increasing her dose of rosuvastatin and will keep labs scheduled on 2/28.

## 2020-08-07 NOTE — Telephone Encounter (Signed)
This encounter was created in error - please disregard.

## 2020-08-24 ENCOUNTER — Encounter (HOSPITAL_COMMUNITY): Payer: Self-pay | Admitting: Emergency Medicine

## 2020-08-24 ENCOUNTER — Other Ambulatory Visit: Payer: Self-pay

## 2020-08-24 ENCOUNTER — Emergency Department (HOSPITAL_COMMUNITY)
Admission: EM | Admit: 2020-08-24 | Discharge: 2020-08-24 | Disposition: A | Discharge status: Home / Self Care | Payer: PRIVATE HEALTH INSURANCE | Attending: Emergency Medicine | Admitting: Emergency Medicine

## 2020-08-24 DIAGNOSIS — Z7982 Long term (current) use of aspirin: Secondary | ICD-10-CM | POA: Insufficient documentation

## 2020-08-24 DIAGNOSIS — J45909 Unspecified asthma, uncomplicated: Secondary | ICD-10-CM | POA: Insufficient documentation

## 2020-08-24 DIAGNOSIS — R070 Pain in throat: Secondary | ICD-10-CM | POA: Diagnosis present

## 2020-08-24 DIAGNOSIS — I10 Essential (primary) hypertension: Secondary | ICD-10-CM | POA: Diagnosis not present

## 2020-08-24 DIAGNOSIS — Z79899 Other long term (current) drug therapy: Secondary | ICD-10-CM | POA: Diagnosis not present

## 2020-08-24 DIAGNOSIS — K209 Esophagitis, unspecified without bleeding: Secondary | ICD-10-CM

## 2020-08-24 MED ORDER — ALUM & MAG HYDROXIDE-SIMETH 200-200-20 MG/5ML PO SUSP
30.0000 mL | Freq: Once | ORAL | Status: AC
  Administered 2020-08-24: 30 mL via ORAL
  Filled 2020-08-24: qty 30

## 2020-08-24 MED ORDER — LIDOCAINE VISCOUS HCL 2 % MT SOLN
15.0000 mL | Freq: Once | OROMUCOSAL | Status: AC
  Administered 2020-08-24: 15 mL via ORAL
  Filled 2020-08-24: qty 15

## 2020-08-24 NOTE — ED Triage Notes (Signed)
Patient presents with throat burning, lip numbness, and throat tightening. Patient states she ate from [employer], and when she bit into her burger, her mouth and throat started burning, and she states her throat feels tight. Patient in NAD, speaking in full sentences. No redness or rash noted.

## 2020-08-24 NOTE — ED Provider Notes (Signed)
[facility] DEPT Provider Note   CSN: [id number] Arrival date & time: 08/24/20  2122     History Chief Complaint  Patient presents with  . Allergic Reaction    [name] [name] is a 55 y.o. female.  55 year old female presents with burning to her throat after eating a [place] fry.  Patient states possibly there was a chemical on the [place] fry.  Denies any systemic symptoms of pruritus.  Denies being short of breath.  Does feel some chest tightness.  States the burning is worse when she swallows.  No treatment use prior to arrival        Past Medical History:  Diagnosis Date  . Anemia    many years ago  . Anxiety    situational  . Arthritis    "hands" (07/12/2012)  . Asthma    seasonal   . Chest pain   . Chest pain at rest, on going 07/12/2012  . Eczema   . Family history of early CAD 07/12/2012  . Fibromyalgia   . GERD (gastroesophageal reflux disease)   . Headache(784.0)    "often; not daily" (07/12/2012)  . Hypertension    not on medications  . Kidney stones   . Migraine   . Pneumonia    as a child  . PONV (postoperative nausea and vomiting)    she states she gets very sick    Patient Active Problem List   Diagnosis Date Noted  . 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 OSA (obstructive sleep apnea) 12/05/2019  . Lymphedema in adult patient 12/05/2019  . Bacterial vaginosis 09/19/2019  . Candidal vulvovaginitis 09/19/2019  . Dysuria 09/19/2019  . Urine finding 09/19/2019  . Vitamin D deficiency 09/19/2019  . Other allergic rhinitis 05/21/2019  . 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 Adverse food reaction 05/21/2019  . Coughing 05/21/2019  . Allergic reaction 05/17/2019  . Hymenoptera allergy 05/17/2019  . Multiple drug allergies 05/17/2019  . Upper airway cough syndrome 02/06/2019  . DOE (dyspnea on exertion) 02/06/2019  . Morbid obesity ([place]) 11/28/2018  . 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 Tinnitus  aurium, unspecified laterality 11/28/2018  . New daily persistent headache 11/28/2018  . OSA on CPAP 11/28/2018  . Acquired trigger finger of left index finger 08/29/2017  . Pain in right hand 08/29/2017  . Trigger finger of right hand 08/29/2017  . Lipoedema 03/14/2017  . Blurring of vision 03/14/2017  . Class 2 obesity with body mass index (BMI) of 38.0 to 38.9 in adult 03/14/2017  . Fibromyalgia 11/09/2016  . History of migraine 11/09/2016  . History of environmental allergies 11/09/2016  . Abdominal apron 10/21/2016  . Myalgia 05/13/2016  . Adjustment insomnia 05/13/2016  . Snoring 05/13/2016  . 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 Hyperlipidemia 11/03/2014  . Pelvic pain in female 07/30/2014  . Chest pain at rest, on going, probable GI source 07/12/2012  . HTN (hypertension) 07/12/2012  . Family history of abdominal aortic aneurysm (AAA) 07/12/2012  . Anxiety 07/12/2012    Past Surgical History:  Procedure Laterality Date  . breast lift    . CARDIAC CATHETERIZATION  07/11/2012  . CHOLECYSTECTOMY N/A 09/18/2015   Procedure: LAPAROSCOPIC CHOLECYSTECTOMY;  Surgeon: Coralie Keens, MD;  Location: [facility];  Service: General;  Laterality: N/A;  . FOOT SURGERY  09/2019   toe surgery  . LEFT HEART CATHETERIZATION WITH CORONARY ANGIOGRAM N/A 07/11/2012   Procedure: LEFT HEART CATHETERIZATION WITH CORONARY ANGIOGRAM;  Surgeon: Lorretta Harp, MD;  Location: [facility] CATH LAB;  Service: Cardiovascular;  Laterality: N/A;  . REDUCTION MAMMAPLASTY Bilateral 10+ years ago  . TONSILLECTOMY  ~ 1976  . tubes and ovaries removed  2015  . VAGINAL HYSTERECTOMY  ~ 2009     OB History   No obstetric history on file.     Family History  Problem Relation Age of Onset  . Hiatal hernia Mother   . Hypertension Mother   . Depression Mother   . Allergic rhinitis Mother   . Heart disease Father   . Stroke Maternal Grandfather   . Chronic Renal Failure Paternal Grandmother   . Drug abuse Son    . Allergic rhinitis Brother   . Allergic rhinitis Maternal Aunt     Social History   Tobacco Use  . Smoking status: Never Smoker  . Smokeless tobacco: Never Used  Vaping Use  . Vaping Use: Never used  Substance Use Topics  . Alcohol use: No  . Drug use: No    Home Medications Prior to Admission medications   Medication Sig Start Date End Date Taking? Authorizing Provider  aspirin EC 81 MG tablet Take 1 tablet (81 mg total) by mouth daily. 05/01/19   Belva Crome, MD  clobetasol (TEMOVATE) 0.05 % external solution clobetasol 0.05 % scalp solution  APPLY TO THE SCALP DAILY AS NEEDED FOR [facility]    [provider]  EPINEPHrine (EPIPEN 2-PAK) 0.3 mg/0.3 mL IJ SOAJ injection Inject 0.3 mLs (0.3 mg total) into the muscle as needed. 10/03/19   Garnet Sierras, DO  estradiol (ESTRACE) 1 MG tablet  04/11/19   [provider]  ezetimibe (ZETIA) 10 MG tablet Take 1 tablet (10 mg total) by mouth daily. 07/07/20 07/02/21  Belva Crome, MD  furosemide (LASIX) 20 MG tablet Take 1 tablet (20 mg total) by mouth daily as needed for fluid or edema. 12/04/19   Kathyrn Drown D, NP  ipratropium (ATROVENT) 0.03 % nasal spray Place 1-2 sprays into both nostrils 3 (three) times daily as needed for rhinitis. 01/07/20   Garnet Sierras, DO  ketoconazole (NIZORAL) 2 % shampoo  04/11/19   [provider]  levocetirizine (XYZAL) 5 MG tablet Take 5 mg by mouth every evening.    [provider]  metoprolol tartrate (LOPRESSOR) 100 MG tablet Take one tablet by mouth 2 hours prior to your CT 04/17/20   Belva Crome, MD  metroNIDAZOLE (METROGEL) 0.75 % gel metronidazole 0.75 % topical gel  APPLY ON THE SKIN DAILY FOR ROSACEA    [provider]  Multiple Vitamins-Calcium (ONE-A-DAY WOMENS PO) Take 1 tablet by mouth daily.    [provider]  olmesartan (BENICAR) 20 MG tablet Take 20 mg by mouth daily. 11/03/18   [provider]  Olopatadine HCl 0.2 % SOLN Apply 1 drop to  eye daily as needed (itchy/watery eyes). 01/07/20   Garnet Sierras, DO  ondansetron (ZOFRAN ODT) 4 MG disintegrating tablet Take 1 tablet (4 mg total) by mouth every 8 (eight) hours as needed for nausea or vomiting. 08/19/17   Francine Graven, DO  RABEprazole (ACIPHEX) 20 MG tablet Take 20 mg by mouth 2 (two) times daily.    [provider]  rizatriptan (MAXALT-MLT) 10 MG disintegrating tablet Take 10 mg by mouth every 2 (two) hours as needed for migraine.  06/18/15   [provider]  rosuvastatin (CRESTOR) 5 MG tablet Take 1 tablet (5 mg total) by mouth daily. 07/07/20 07/07/21  [name],  Lynnell Dike, MD  Vitamin D, Ergocalciferol, (DRISDOL) 50000 units CAPS capsule Take 50,000 Units by mouth every Wednesday.     [provider]    Allergies    Betadine [povidone iodine], Contrast media [iodinated diagnostic agents], Morphine and related, Prednisone, Yellow jacket venom [bee venom], Azithromycin, Erythromycin, Iohexol, Oxycontin [oxycodone], Avelox [moxifloxacin hcl in nacl], Codeine, Gadolinium derivatives, Iodine, Lamisil [terbinafine], Levaquin [levofloxacin in d5w], Oxycodone-acetaminophen, Pantoprazole, Praluent [alirocumab], Pravastatin, Rosuvastatin, Shellfish allergy, Augmentin [amoxicillin-pot clavulanate], Dilaudid [hydromorphone hcl], Oxycodone hcl, and Tramadol  Review of Systems   Review of Systems  All other systems reviewed and are negative.   Physical Exam Updated Vital Signs BP (!) 186/110 (BP Location: Right Arm)   Pulse 90   Temp 98.1 F (36.7 C) (Oral)   Resp 18   Ht 1.651 m (5\' 5" )   Wt 102.1 kg   SpO2 100%   BMI 37.44 kg/m   Physical Exam Vitals and nursing note reviewed.  Constitutional:      General: She is not in acute distress.    Appearance: Normal appearance. She is well-developed and well-nourished. She is not toxic-appearing.  HENT:     Head: Normocephalic and atraumatic.     Mouth/Throat:     Comments: No evidence of erythema. Eyes:      General: Lids are normal.     Extraocular Movements: EOM normal.     Conjunctiva/sclera: Conjunctivae normal.     Pupils: Pupils are equal, round, and reactive to light.  Neck:     Thyroid: No thyroid mass.     Trachea: No tracheal deviation.  Cardiovascular:     Rate and Rhythm: Normal rate and regular rhythm.     Heart sounds: Normal heart sounds. No murmur heard. No gallop.   Pulmonary:     Effort: Pulmonary effort is normal. No respiratory distress.     Breath sounds: Normal breath sounds. No stridor. No decreased breath sounds, wheezing, rhonchi or rales.  Abdominal:     General: Bowel sounds are normal. There is no distension.     Palpations: Abdomen is soft.     Tenderness: There is no abdominal tenderness. There is no CVA tenderness or rebound.  Musculoskeletal:        General: No tenderness or edema. Normal range of motion.     Cervical back: Normal range of motion and neck supple.  Skin:    General: Skin is warm and dry.     Findings: No abrasion or rash.  Neurological:     Mental Status: She is alert and oriented to person, place, and time.     GCS: GCS eye subscore is 4. GCS verbal subscore is 5. GCS motor subscore is 6.     Cranial Nerves: No cranial nerve deficit.     Sensory: No sensory deficit.     Deep Tendon Reflexes: Strength normal.  Psychiatric:        Mood and Affect: Mood and affect normal.        Speech: Speech normal.        Behavior: Behavior normal.     ED Results / Procedures / Treatments   Labs (all labs ordered are listed, but only abnormal results are displayed) Labs Reviewed - No data to display  EKG None  Radiology No results found.  Procedures Procedures (including critical care time)  Medications Ordered in ED Medications  alum & mag hydroxide-simeth (MAALOX/MYLANTA) 200-200-20 MG/5ML suspension 30 mL (has no administration in time range)  And  lidocaine (XYLOCAINE) 2 % viscous mouth solution 15 mL (has no administration  in time range)    ED Course  I have reviewed the triage vital signs and the nursing notes.  Pertinent labs & imaging results that were available during my care of the patient were reviewed by me and considered in my medical decision making (see chart for details).    MDM Rules/Calculators/A&P                          No evidence of systemic allergic reaction. Will give patient GI cocktail and then discharged home Final Clinical Impression(s) / ED Diagnoses Final diagnoses:  None    Rx / DC Orders ED Discharge Orders    None       Lacretia Leigh, MD 08/24/20 2153

## 2020-08-29 ENCOUNTER — Telehealth: Payer: Self-pay | Admitting: Allergy

## 2020-08-29 MED ORDER — LEVOCETIRIZINE DIHYDROCHLORIDE 5 MG PO TABS
5.0000 mg | ORAL_TABLET | Freq: Every evening | ORAL | 5 refills | Status: DC
Start: 2020-08-29 — End: 2021-01-05

## 2020-08-29 NOTE — Telephone Encounter (Signed)
As long as she comes in before 09/05/20, She can get 0.4mL of the NEXT CONCENTRATION DOWN 0.0003 of the mixed vespid.  Patient is very sensitive to these injections.  She was started on the 0.0003 concentration which is lower than our usual start.

## 2020-08-29 NOTE — Telephone Encounter (Signed)
Patient called requesting to be fit in this afternoon for her injection. She has been sick since December and needs to get her injection.

## 2020-08-29 NOTE — Telephone Encounter (Signed)
Dr. Maudie Mercury, Spoke with patient advise that it has been 6 weeks since her last injection of MV on 07/18/2020. Patient was building up on 0.003 and was given 0.05 of the injection. Please advise when patient can come back in to restart her injections for venom so that we can place her back on the schedule. Thanks.

## 2020-08-29 NOTE — Telephone Encounter (Signed)
Flowsheet updated and left message for patient to call to schedule next venom injection

## 2020-09-08 NOTE — Telephone Encounter (Signed)
Patient returned call today about starting back with her injection. Please advise plan for her injection and send back to me so I can call her personally. Thank You.

## 2020-09-08 NOTE — Telephone Encounter (Signed)
As long as she comes in before 09/12/20, She can get 0.28mL of the NEXT CONCENTRATION DOWN 0.0003 of the mixed vespid.  Patient is very sensitive to these injections.  She was started on the 0.0003 concentration which is lower than our usual start.

## 2020-09-09 NOTE — Telephone Encounter (Signed)
Patient called back and was instructed on build up and has been placed on the schedule. Her venom flow sheet has been updated to reflect these changes.

## 2020-09-10 ENCOUNTER — Ambulatory Visit: Payer: Self-pay

## 2020-09-11 ENCOUNTER — Other Ambulatory Visit: Payer: Self-pay

## 2020-09-11 ENCOUNTER — Ambulatory Visit (INDEPENDENT_AMBULATORY_CARE_PROVIDER_SITE_OTHER): Payer: PRIVATE HEALTH INSURANCE

## 2020-09-11 DIAGNOSIS — Z91038 Other insect allergy status: Secondary | ICD-10-CM | POA: Diagnosis not present

## 2020-09-16 ENCOUNTER — Ambulatory Visit: Payer: Self-pay

## 2020-09-17 ENCOUNTER — Ambulatory Visit (INDEPENDENT_AMBULATORY_CARE_PROVIDER_SITE_OTHER): Payer: PRIVATE HEALTH INSURANCE | Admitting: *Deleted

## 2020-09-17 DIAGNOSIS — Z91038 Other insect allergy status: Secondary | ICD-10-CM

## 2020-09-23 ENCOUNTER — Ambulatory Visit: Payer: Self-pay

## 2020-09-24 ENCOUNTER — Ambulatory Visit (INDEPENDENT_AMBULATORY_CARE_PROVIDER_SITE_OTHER): Payer: PRIVATE HEALTH INSURANCE

## 2020-09-24 ENCOUNTER — Ambulatory Visit: Payer: Self-pay

## 2020-09-24 DIAGNOSIS — Z91038 Other insect allergy status: Secondary | ICD-10-CM

## 2020-09-24 DIAGNOSIS — T63441D Toxic effect of venom of bees, accidental (unintentional), subsequent encounter: Secondary | ICD-10-CM

## 2020-09-27 ENCOUNTER — Emergency Department (HOSPITAL_COMMUNITY)
Admission: EM | Admit: 2020-09-27 | Discharge: 2020-09-27 | Disposition: A | Discharge status: Home / Self Care | Payer: PRIVATE HEALTH INSURANCE | Attending: Emergency Medicine | Admitting: Emergency Medicine

## 2020-09-27 ENCOUNTER — Other Ambulatory Visit: Payer: Self-pay

## 2020-09-27 ENCOUNTER — Encounter (HOSPITAL_COMMUNITY): Payer: Self-pay | Admitting: *Deleted

## 2020-09-27 DIAGNOSIS — J45909 Unspecified asthma, uncomplicated: Secondary | ICD-10-CM | POA: Diagnosis not present

## 2020-09-27 DIAGNOSIS — J029 Acute pharyngitis, unspecified: Secondary | ICD-10-CM | POA: Diagnosis not present

## 2020-09-27 DIAGNOSIS — Z20822 Contact with and (suspected) exposure to covid-19: Secondary | ICD-10-CM | POA: Insufficient documentation

## 2020-09-27 DIAGNOSIS — R509 Fever, unspecified: Secondary | ICD-10-CM | POA: Diagnosis present

## 2020-09-27 DIAGNOSIS — Z7982 Long term (current) use of aspirin: Secondary | ICD-10-CM | POA: Insufficient documentation

## 2020-09-27 DIAGNOSIS — Z79899 Other long term (current) drug therapy: Secondary | ICD-10-CM | POA: Diagnosis not present

## 2020-09-27 DIAGNOSIS — B349 Viral infection, unspecified: Secondary | ICD-10-CM | POA: Insufficient documentation

## 2020-09-27 DIAGNOSIS — I10 Essential (primary) hypertension: Secondary | ICD-10-CM

## 2020-09-27 LAB — RESP PANEL BY RT-PCR (FLU A&B, COVID) ARPGX2
Influenza A by PCR: NEGATIVE
Influenza B by PCR: NEGATIVE
SARS Coronavirus 2 by RT PCR: NEGATIVE

## 2020-09-27 LAB — GROUP A STREP BY PCR: Group A Strep by PCR: NOT DETECTED

## 2020-09-27 NOTE — ED Provider Notes (Signed)
[facility] EMERGENCY DEPARTMENT Provider Note   CSN: [id number] Arrival date & time: 09/27/20  1307     History Chief Complaint  Patient presents with  . Fever    [name] [name] is a 55 y.o. female.  Patient complains of fever cough aches and sore throat for couple days.  The history is provided by the patient and medical records. No language interpreter was used.  Fever Max temp prior to arrival:  Unknown Temp source:  Subjective Severity:  Mild Onset quality:  Sudden Timing:  Constant Progression:  Waxing and waning Chronicity:  New Relieved by:  Nothing Worsened by:  Nothing Associated symptoms: no chest pain, no congestion, no cough, no diarrhea, no headaches and no rash        Past Medical History:  Diagnosis Date  . Anemia    many years ago  . Anxiety    situational  . Arthritis    "hands" (07/12/2012)  . Asthma    seasonal   . Chest pain   . Chest pain at rest, on going 07/12/2012  . Eczema   . Family history of early CAD 07/12/2012  . Fibromyalgia   . GERD (gastroesophageal reflux disease)   . Headache(784.0)    "often; not daily" (07/12/2012)  . Hypertension    not on medications  . Kidney stones   . Migraine   . Pneumonia    as a child  . PONV (postoperative nausea and vomiting)    she states she gets very sick    Patient Active Problem List   Diagnosis Date Noted  . 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 OSA (obstructive sleep apnea) 12/05/2019  . Lymphedema in adult patient 12/05/2019  . Bacterial vaginosis 09/19/2019  . Candidal vulvovaginitis 09/19/2019  . Dysuria 09/19/2019  . Urine finding 09/19/2019  . Vitamin D deficiency 09/19/2019  . Other allergic rhinitis 05/21/2019  . 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 Adverse food reaction 05/21/2019  . Coughing 05/21/2019  . Allergic reaction 05/17/2019  . Hymenoptera allergy 05/17/2019  . Multiple drug allergies 05/17/2019  . Upper airway cough syndrome 02/06/2019  . DOE (dyspnea  on exertion) 02/06/2019  . Morbid obesity ([place]) 11/28/2018  . 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 Tinnitus aurium, unspecified laterality 11/28/2018  . New daily persistent headache 11/28/2018  . OSA on CPAP 11/28/2018  . Acquired trigger finger of left index finger 08/29/2017  . Pain in right hand 08/29/2017  . Trigger finger of right hand 08/29/2017  . Lipoedema 03/14/2017  . Blurring of vision 03/14/2017  . Class 2 obesity with body mass index (BMI) of 38.0 to 38.9 in adult 03/14/2017  . Fibromyalgia 11/09/2016  . History of migraine 11/09/2016  . History of environmental allergies 11/09/2016  . Abdominal apron 10/21/2016  . Myalgia 05/13/2016  . Adjustment insomnia 05/13/2016  . Snoring 05/13/2016  . 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 Hyperlipidemia 11/03/2014  . Pelvic pain in female 07/30/2014  . Chest pain at rest, on going, probable GI source 07/12/2012  . HTN (hypertension) 07/12/2012  . Family history of abdominal aortic aneurysm (AAA) 07/12/2012  . Anxiety 07/12/2012    Past Surgical History:  Procedure Laterality Date  . breast lift    . CARDIAC CATHETERIZATION  07/11/2012  . CHOLECYSTECTOMY N/A 09/18/2015   Procedure: LAPAROSCOPIC CHOLECYSTECTOMY;  Surgeon: Coralie Keens, MD;  Location: [place];  Service: General;  Laterality: N/A;  . FOOT SURGERY  09/2019   toe surgery  . LEFT HEART CATHETERIZATION  WITH CORONARY ANGIOGRAM N/A 07/11/2012   Procedure: LEFT HEART CATHETERIZATION WITH CORONARY ANGIOGRAM;  Surgeon: Lorretta Harp, MD;  Location: [facility] CATH LAB;  Service: Cardiovascular;  Laterality: N/A;  . REDUCTION MAMMAPLASTY Bilateral 10+ years ago  . TONSILLECTOMY  ~ 1976  . tubes and ovaries removed  2015  . VAGINAL HYSTERECTOMY  ~ 2009     OB History   No obstetric history on file.     Family History  Problem Relation Age of Onset  . Hiatal hernia Mother   . Hypertension Mother   . Depression Mother   . Allergic rhinitis Mother    . Heart disease Father   . Stroke Maternal Grandfather   . Chronic Renal Failure Paternal Grandmother   . Drug abuse Son   . Allergic rhinitis Brother   . Allergic rhinitis Maternal Aunt     Social History   Tobacco Use  . Smoking status: Never Smoker  . Smokeless tobacco: Never Used  Vaping Use  . Vaping Use: Never used  Substance Use Topics  . Alcohol use: No  . Drug use: No    Home Medications Prior to Admission medications   Medication Sig Start Date End Date Taking? Authorizing Provider  aspirin EC 81 MG tablet Take 1 tablet (81 mg total) by mouth daily. 05/01/19   Belva Crome, MD  clobetasol (TEMOVATE) 0.05 % external solution clobetasol 0.05 % scalp solution  APPLY TO THE SCALP DAILY AS NEEDED FOR [place]    [provider]  EPINEPHrine (EPIPEN 2-PAK) 0.3 mg/0.3 mL IJ SOAJ injection Inject 0.3 mLs (0.3 mg total) into the muscle as needed. 10/03/19   Garnet Sierras, DO  estradiol (ESTRACE) 1 MG tablet  04/11/19   [provider]  ezetimibe (ZETIA) 10 MG tablet Take 1 tablet (10 mg total) by mouth daily. 07/07/20 07/02/21  Belva Crome, MD  furosemide (LASIX) 20 MG tablet Take 1 tablet (20 mg total) by mouth daily as needed for fluid or edema. 12/04/19   Kathyrn Drown D, NP  ipratropium (ATROVENT) 0.03 % nasal spray Place 1-2 sprays into both nostrils 3 (three) times daily as needed for rhinitis. 01/07/20   Garnet Sierras, DO  ketoconazole (NIZORAL) 2 % shampoo  04/11/19   [provider]  levocetirizine (XYZAL) 5 MG tablet Take 1 tablet (5 mg total) by mouth every evening. 08/29/20   Garnet Sierras, DO  metoprolol tartrate (LOPRESSOR) 100 MG tablet Take one tablet by mouth 2 hours prior to your CT 04/17/20   Belva Crome, MD  metroNIDAZOLE (METROGEL) 0.75 % gel metronidazole 0.75 % topical gel  APPLY ON THE SKIN DAILY FOR ROSACEA    [provider]  Multiple Vitamins-Calcium (ONE-A-DAY WOMENS PO) Take 1 tablet by mouth daily.    [provider]   olmesartan (BENICAR) 20 MG tablet Take 20 mg by mouth daily. 11/03/18   [provider]  Olopatadine HCl 0.2 % SOLN Apply 1 drop to eye daily as needed (itchy/watery eyes). 01/07/20   Garnet Sierras, DO  ondansetron (ZOFRAN ODT) 4 MG disintegrating tablet Take 1 tablet (4 mg total) by mouth every 8 (eight) hours as needed for nausea or vomiting. 08/19/17   Francine Graven, DO  RABEprazole (ACIPHEX) 20 MG tablet Take 20 mg by mouth 2 (two) times daily.    [provider]  rizatriptan (MAXALT-MLT) 10 MG disintegrating tablet Take 10 mg by mouth every 2 (two) hours as needed for migraine.  06/18/15   [provider]  rosuvastatin (CRESTOR) 5 MG tablet Take 1 tablet (5 mg total) by mouth daily. 07/07/20 07/07/21  Belva Crome, MD  Vitamin D, Ergocalciferol, (DRISDOL) 50000 units CAPS capsule Take 50,000 Units by mouth every Wednesday.     [provider]    Allergies    Betadine [povidone iodine], Contrast media [iodinated diagnostic agents], Morphine and related, Prednisone, Yellow jacket venom [bee venom], Azithromycin, Erythromycin, Iohexol, Oxycontin [oxycodone], Avelox [moxifloxacin hcl in nacl], Codeine, Gadolinium derivatives, Iodine, Lamisil [terbinafine], Levaquin [levofloxacin in d5w], Oxycodone-acetaminophen, Pantoprazole, Praluent [alirocumab], Pravastatin, Rosuvastatin, Shellfish allergy, Augmentin [amoxicillin-pot clavulanate], Dilaudid [hydromorphone hcl], Oxycodone hcl, and Tramadol  Review of Systems   Review of Systems  Constitutional: Positive for fever. Negative for appetite change and fatigue.  HENT: Negative for congestion, ear discharge and sinus pressure.        Sore throat  Eyes: Negative for discharge.  Respiratory: Negative for cough.   Cardiovascular: Negative for chest pain.  Gastrointestinal: Negative for abdominal pain and diarrhea.  Genitourinary: Negative for frequency and hematuria.  Musculoskeletal: Negative for back pain.  Skin:  Negative for rash.  Neurological: Negative for seizures and headaches.  Psychiatric/Behavioral: Negative for hallucinations.    Physical Exam Updated Vital Signs BP (!) 152/103   Pulse 95   Temp 98 F (36.7 C) (Oral)   Resp 18   Ht 5\' 5"  (1.651 m)   SpO2 100%   BMI 37.44 kg/m   Physical Exam Vitals reviewed.  Constitutional:      Appearance: She is well-developed.  HENT:     Head: Normocephalic.     Nose: Nose normal.     Mouth/Throat:     Comments: Mild erythema to pharynx Eyes:     General: No scleral icterus.    Extraocular Movements: EOM normal.     Conjunctiva/sclera: Conjunctivae normal.  Neck:     Thyroid: No thyromegaly.  Cardiovascular:     Rate and Rhythm: Normal rate and regular rhythm.     Heart sounds: No murmur heard. No friction rub. No gallop.   Pulmonary:     Breath sounds: No stridor. No wheezing or rales.  Chest:     Chest wall: No tenderness.  Abdominal:     General: There is no distension.     Tenderness: There is no abdominal tenderness. There is no rebound.  Musculoskeletal:        General: No edema. Normal range of motion.     Cervical back: Neck supple.  Lymphadenopathy:     Cervical: No cervical adenopathy.  Skin:    Findings: No erythema or rash.  Neurological:     Mental Status: She is alert and oriented to person, place, and time.     Motor: No abnormal muscle tone.     Coordination: Coordination normal.  Psychiatric:        Mood and Affect: Mood and affect normal.        Behavior: Behavior normal.     ED Results / Procedures / Treatments   Labs (all labs ordered are listed, but only abnormal results are displayed) Labs Reviewed  GROUP A STREP BY PCR  RESP PANEL BY RT-PCR (FLU A&B, COVID) [id number]    EKG None  Radiology No results found.  Procedures Procedures   Medications Ordered in ED Medications - No data to display  ED Course  I have reviewed the triage vital signs and the nursing notes.  Pertinent labs  & imaging results that  were available during my care of the patient were reviewed by me and considered in my medical decision making (see chart for details).    MDM Rules/Calculators/A&P                          Patient with pharyngitis.  Strep test and Covid test pending.        Strep and Covid now negative.  Patient with viral syndrome final Clinical Impression(s) / ED Diagnoses Final diagnoses:  Pharyngitis with viral syndrome  Primary hypertension    Rx / DC Orders ED Discharge Orders    None       Milton Ferguson, MD 10/02/20 [phone]

## 2020-09-27 NOTE — ED Triage Notes (Signed)
Sore throat, cough, fever and chills

## 2020-09-27 NOTE — Discharge Instructions (Addendum)
Have your blood pressure rechecked this week.  You will be contacted with the results of your strep test and Covid test

## 2020-09-29 ENCOUNTER — Other Ambulatory Visit: Payer: PRIVATE HEALTH INSURANCE | Admitting: *Deleted

## 2020-09-29 ENCOUNTER — Other Ambulatory Visit: Payer: Self-pay

## 2020-09-29 DIAGNOSIS — E7801 Familial hypercholesterolemia: Secondary | ICD-10-CM

## 2020-09-30 ENCOUNTER — Ambulatory Visit (INDEPENDENT_AMBULATORY_CARE_PROVIDER_SITE_OTHER): Payer: PRIVATE HEALTH INSURANCE

## 2020-09-30 DIAGNOSIS — T63441D Toxic effect of venom of bees, accidental (unintentional), subsequent encounter: Secondary | ICD-10-CM

## 2020-09-30 LAB — NMR, LIPOPROFILE
Cholesterol, Total: 168 mg/dL (ref 100–199)
HDL Particle Number: 63.4 umol/L (ref >=30.5)
HDL-C: 69 mg/dL (ref >39)
LDL Particle Number: 1011 nmol/L — ABNORMAL HIGH (ref <1000)
LDL Size: 20.6 nm (ref >20.5)
LDL-C (NIH Calc): 66 mg/dL (ref 0–99)
LP-IR Score: 59 — ABNORMAL HIGH (ref <=45)
Small LDL Particle Number: 723 nmol/L — ABNORMAL HIGH (ref <=527)
Triglycerides: 201 mg/dL — ABNORMAL HIGH (ref 0–149)

## 2020-09-30 LAB — LIPOPROTEIN A (LPA): Lipoprotein (a): 209.1 nmol/L — ABNORMAL HIGH (ref <75.0)

## 2020-10-02 ENCOUNTER — Telehealth: Payer: Self-pay | Admitting: Pharmacist

## 2020-10-02 DIAGNOSIS — E7801 Familial hypercholesterolemia: Secondary | ICD-10-CM

## 2020-10-02 NOTE — Telephone Encounter (Signed)
Called pt to follow up with lipid panel. Currently taking rosuvastatin 5mg  daily and ezetimibe 10mg  daily. Does report some gas and some pain in her lumbar area but is willing to continue on both medications.  Previously intolerant to: rosuvastatin 10mg  daily, pravastatin 20mg  daily, and Praluent.  LDL has improved from 99 to 66 and is now at goal < 70 since adding on ezetimbe. Lp(a) remains elevated and her LDL particle # is also elevated above goal < 700. Discussed changing ezetimibe to Nexlizet to lower her LDL particle # further. She wishes to focus on lifestyle improvements over the next 3 months and have her NMR panel and Lp(a) rechecked in 3 months. Can discuss Nexlizet again at that time.

## 2020-10-03 NOTE — Progress Notes (Signed)
Noted conversation with patient and agree.

## 2020-10-07 ENCOUNTER — Other Ambulatory Visit: Payer: Self-pay

## 2020-10-07 ENCOUNTER — Ambulatory Visit (INDEPENDENT_AMBULATORY_CARE_PROVIDER_SITE_OTHER): Payer: PRIVATE HEALTH INSURANCE | Admitting: *Deleted

## 2020-10-07 DIAGNOSIS — T63441D Toxic effect of venom of bees, accidental (unintentional), subsequent encounter: Secondary | ICD-10-CM

## 2020-10-14 ENCOUNTER — Ambulatory Visit (INDEPENDENT_AMBULATORY_CARE_PROVIDER_SITE_OTHER): Payer: PRIVATE HEALTH INSURANCE | Admitting: *Deleted

## 2020-10-14 ENCOUNTER — Other Ambulatory Visit: Payer: Self-pay

## 2020-10-14 DIAGNOSIS — T63441D Toxic effect of venom of bees, accidental (unintentional), subsequent encounter: Secondary | ICD-10-CM | POA: Diagnosis not present

## 2020-10-21 ENCOUNTER — Ambulatory Visit (INDEPENDENT_AMBULATORY_CARE_PROVIDER_SITE_OTHER): Payer: PRIVATE HEALTH INSURANCE | Admitting: *Deleted

## 2020-10-21 ENCOUNTER — Other Ambulatory Visit: Payer: Self-pay

## 2020-10-21 DIAGNOSIS — T63441D Toxic effect of venom of bees, accidental (unintentional), subsequent encounter: Secondary | ICD-10-CM | POA: Diagnosis not present

## 2020-10-28 ENCOUNTER — Other Ambulatory Visit: Payer: Self-pay

## 2020-10-28 ENCOUNTER — Ambulatory Visit (INDEPENDENT_AMBULATORY_CARE_PROVIDER_SITE_OTHER): Payer: PRIVATE HEALTH INSURANCE | Admitting: *Deleted

## 2020-10-28 DIAGNOSIS — T63441D Toxic effect of venom of bees, accidental (unintentional), subsequent encounter: Secondary | ICD-10-CM

## 2020-11-04 ENCOUNTER — Ambulatory Visit: Payer: Self-pay

## 2020-11-06 ENCOUNTER — Ambulatory Visit: Payer: Self-pay

## 2020-11-11 ENCOUNTER — Other Ambulatory Visit: Payer: Self-pay

## 2020-11-11 ENCOUNTER — Ambulatory Visit (INDEPENDENT_AMBULATORY_CARE_PROVIDER_SITE_OTHER): Payer: PRIVATE HEALTH INSURANCE | Admitting: *Deleted

## 2020-11-11 DIAGNOSIS — T63441D Toxic effect of venom of bees, accidental (unintentional), subsequent encounter: Secondary | ICD-10-CM | POA: Diagnosis not present

## 2020-11-17 ENCOUNTER — Ambulatory Visit (INDEPENDENT_AMBULATORY_CARE_PROVIDER_SITE_OTHER): Payer: PRIVATE HEALTH INSURANCE | Admitting: *Deleted

## 2020-11-17 ENCOUNTER — Other Ambulatory Visit: Payer: Self-pay

## 2020-11-17 ENCOUNTER — Other Ambulatory Visit: Payer: Self-pay | Admitting: Cardiology

## 2020-11-17 ENCOUNTER — Encounter: Payer: Self-pay | Admitting: Adult Health

## 2020-11-17 ENCOUNTER — Other Ambulatory Visit: Payer: Self-pay | Admitting: Pharmacist

## 2020-11-17 ENCOUNTER — Ambulatory Visit: Payer: Self-pay

## 2020-11-17 DIAGNOSIS — T63441D Toxic effect of venom of bees, accidental (unintentional), subsequent encounter: Secondary | ICD-10-CM

## 2020-11-17 MED ORDER — FUROSEMIDE 20 MG PO TABS: 20.0000 mg | ORAL_TABLET | Freq: Every day | ORAL | 3 refills | Status: DC | PRN

## 2020-11-28 ENCOUNTER — Other Ambulatory Visit: Payer: Self-pay

## 2020-11-28 ENCOUNTER — Ambulatory Visit (INDEPENDENT_AMBULATORY_CARE_PROVIDER_SITE_OTHER): Payer: PRIVATE HEALTH INSURANCE

## 2020-11-28 DIAGNOSIS — T63441D Toxic effect of venom of bees, accidental (unintentional), subsequent encounter: Secondary | ICD-10-CM | POA: Diagnosis not present

## 2020-12-04 ENCOUNTER — Other Ambulatory Visit: Payer: Self-pay

## 2020-12-04 ENCOUNTER — Ambulatory Visit: Payer: Self-pay

## 2020-12-04 ENCOUNTER — Ambulatory Visit (INDEPENDENT_AMBULATORY_CARE_PROVIDER_SITE_OTHER): Payer: PRIVATE HEALTH INSURANCE | Admitting: *Deleted

## 2020-12-04 DIAGNOSIS — T63441D Toxic effect of venom of bees, accidental (unintentional), subsequent encounter: Secondary | ICD-10-CM | POA: Diagnosis not present

## 2020-12-05 ENCOUNTER — Emergency Department (HOSPITAL_BASED_OUTPATIENT_CLINIC_OR_DEPARTMENT_OTHER)
Admission: EM | Admit: 2020-12-05 | Discharge: 2020-12-05 | Disposition: A | Discharge status: Home / Self Care | Payer: PRIVATE HEALTH INSURANCE | Attending: Emergency Medicine | Admitting: Emergency Medicine

## 2020-12-05 ENCOUNTER — Emergency Department (HOSPITAL_BASED_OUTPATIENT_CLINIC_OR_DEPARTMENT_OTHER): Payer: PRIVATE HEALTH INSURANCE

## 2020-12-05 ENCOUNTER — Encounter (HOSPITAL_BASED_OUTPATIENT_CLINIC_OR_DEPARTMENT_OTHER): Payer: Self-pay | Admitting: Emergency Medicine

## 2020-12-05 ENCOUNTER — Other Ambulatory Visit: Payer: Self-pay

## 2020-12-05 DIAGNOSIS — M549 Dorsalgia, unspecified: Secondary | ICD-10-CM | POA: Diagnosis not present

## 2020-12-05 DIAGNOSIS — J45909 Unspecified asthma, uncomplicated: Secondary | ICD-10-CM | POA: Diagnosis not present

## 2020-12-05 DIAGNOSIS — K219 Gastro-esophageal reflux disease without esophagitis: Secondary | ICD-10-CM | POA: Insufficient documentation

## 2020-12-05 DIAGNOSIS — Z7982 Long term (current) use of aspirin: Secondary | ICD-10-CM | POA: Insufficient documentation

## 2020-12-05 DIAGNOSIS — I10 Essential (primary) hypertension: Secondary | ICD-10-CM | POA: Diagnosis not present

## 2020-12-05 DIAGNOSIS — Z79899 Other long term (current) drug therapy: Secondary | ICD-10-CM | POA: Insufficient documentation

## 2020-12-05 DIAGNOSIS — R109 Unspecified abdominal pain: Secondary | ICD-10-CM | POA: Diagnosis present

## 2020-12-05 LAB — CBC
HCT: 43.9 % (ref 36.0–46.0)
Hemoglobin: 13.9 g/dL (ref 12.0–15.0)
MCH: 30.4 pg (ref 26.0–34.0)
MCHC: 31.7 g/dL (ref 30.0–36.0)
MCV: 96.1 fL (ref 80.0–100.0)
Platelets: 275 10*3/uL (ref 150–400)
RBC: 4.57 MIL/uL (ref 3.87–5.11)
RDW: 13.2 % (ref 11.5–15.5)
WBC: 8 10*3/uL (ref 4.0–10.5)
nRBC: 0 % (ref 0.0–0.2)

## 2020-12-05 LAB — URINALYSIS, ROUTINE W REFLEX MICROSCOPIC
Bilirubin Urine: NEGATIVE
Glucose, UA: NEGATIVE mg/dL
Hgb urine dipstick: NEGATIVE
Ketones, ur: NEGATIVE mg/dL
Leukocytes,Ua: NEGATIVE
Nitrite: NEGATIVE
Protein, ur: NEGATIVE mg/dL
Specific Gravity, Urine: 1.012 (ref 1.005–1.030)
pH: 6 (ref 5.0–8.0)

## 2020-12-05 LAB — BASIC METABOLIC PANEL
Anion gap: 8 (ref 5–15)
BUN: 11 mg/dL (ref 6–20)
CO2: 28 mmol/L (ref 22–32)
Calcium: 9 mg/dL (ref 8.9–10.3)
Chloride: 104 mmol/L (ref 98–111)
Creatinine, Ser: 0.89 mg/dL (ref 0.44–1.00)
GFR, Estimated: >60 mL/min (ref >60)
Glucose, Bld: 90 mg/dL (ref 70–99)
Potassium: 4.3 mmol/L (ref 3.5–5.1)
Sodium: 140 mmol/L (ref 135–145)

## 2020-12-05 LAB — HEPATIC FUNCTION PANEL
ALT: 17 U/L (ref 0–44)
AST: 14 U/L — ABNORMAL LOW (ref 15–41)
Albumin: 4.3 g/dL (ref 3.5–5.0)
Alkaline Phosphatase: 55 U/L (ref 38–126)
Bilirubin, Direct: 0.1 mg/dL (ref 0.0–0.2)
Indirect Bilirubin: 0.4 mg/dL (ref 0.3–0.9)
Total Bilirubin: 0.5 mg/dL (ref 0.3–1.2)
Total Protein: 7.1 g/dL (ref 6.5–8.1)

## 2020-12-05 MED ORDER — SODIUM CHLORIDE 0.9 % IV BOLUS
1000.0000 mL | Freq: Once | INTRAVENOUS | Status: AC
  Administered 2020-12-05: 1000 mL via INTRAVENOUS

## 2020-12-05 MED ORDER — KETOROLAC TROMETHAMINE 30 MG/ML IJ SOLN
30.0000 mg | Freq: Once | INTRAMUSCULAR | Status: AC
  Administered 2020-12-05: 30 mg via INTRAVENOUS
  Filled 2020-12-05: qty 1

## 2020-12-05 NOTE — ED Triage Notes (Signed)
Right flank pain x 1 day , Hx kidney stone, denies dysuria or hematuria.

## 2020-12-05 NOTE — ED Provider Notes (Signed)
[facility] EMERGENCY DEPT Provider Note   CSN: [id number] Arrival date & time: 12/05/20  1153     History Chief Complaint  Patient presents with  . Flank Pain    righ    [name] [name] is a 55 y.o. female.  She is here with a complaint of intermittent right flank and back pain for a few days.  No nausea vomiting diarrhea or urinary symptoms.  History of kidney stones and also history of lipoedema, which causes painful fatty tumors in her skin and soft tissue.  Pain at maximum is 6 out of 10 currently 4 out of 10.  No fevers or chills.  The history is provided by the patient.  Flank Pain This is a new problem. The current episode started more than 2 days ago. The problem occurs daily. The problem has not changed since onset.Pertinent negatives include no chest pain, no abdominal pain, no headaches and no shortness of breath. The symptoms are aggravated by bending and twisting. Nothing relieves the symptoms. She has tried nothing for the symptoms. The treatment provided no relief.       Past Medical History:  Diagnosis Date  . Anemia    many years ago  . Anxiety    situational  . Arthritis    "hands" (07/12/2012)  . Asthma    seasonal   . Chest pain   . Chest pain at rest, on going 07/12/2012  . Eczema   . Family history of early CAD 07/12/2012  . Fibromyalgia   . GERD (gastroesophageal reflux disease)   . Headache(784.0)    "often; not daily" (07/12/2012)  . Hypertension    not on medications  . Kidney stones   . Migraine   . Pneumonia    as a child  . PONV (postoperative nausea and vomiting)    she states she gets very sick    Patient Active Problem List   Diagnosis Date Noted  . 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 OSA (obstructive sleep apnea) 12/05/2019  . Lymphedema in adult patient 12/05/2019  . Bacterial vaginosis 09/19/2019  . Candidal vulvovaginitis 09/19/2019  . Dysuria 09/19/2019  . Urine finding 09/19/2019  . Vitamin D  deficiency 09/19/2019  . Other allergic rhinitis 05/21/2019  . 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 Adverse food reaction 05/21/2019  . Coughing 05/21/2019  . Allergic reaction 05/17/2019  . Hymenoptera allergy 05/17/2019  . Multiple drug allergies 05/17/2019  . Upper airway cough syndrome 02/06/2019  . DOE (dyspnea on exertion) 02/06/2019  . Morbid obesity ([place]) 11/28/2018  . 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 Tinnitus aurium, unspecified laterality 11/28/2018  . New daily persistent headache 11/28/2018  . OSA on CPAP 11/28/2018  . Acquired trigger finger of left index finger 08/29/2017  . Pain in right hand 08/29/2017  . Trigger finger of right hand 08/29/2017  . Lipoedema 03/14/2017  . Blurring of vision 03/14/2017  . Class 2 obesity with body mass index (BMI) of 38.0 to 38.9 in adult 03/14/2017  . Fibromyalgia 11/09/2016  . History of migraine 11/09/2016  . History of environmental allergies 11/09/2016  . Abdominal apron 10/21/2016  . Myalgia 05/13/2016  . Adjustment insomnia 05/13/2016  . Snoring 05/13/2016  . 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 Hyperlipidemia 11/03/2014  . Pelvic pain in female 07/30/2014  . Chest pain at rest, on going, probable GI source 07/12/2012  . HTN (hypertension) 07/12/2012  . Family history of abdominal aortic aneurysm (AAA) 07/12/2012  . Anxiety 07/12/2012  Past Surgical History:  Procedure Laterality Date  . breast lift    . CARDIAC CATHETERIZATION  07/11/2012  . CHOLECYSTECTOMY N/A 09/18/2015   Procedure: LAPAROSCOPIC CHOLECYSTECTOMY;  Surgeon: Coralie Keens, MD;  Location: [place] [facility];  Service: General;  Laterality: N/A;  . FOOT SURGERY  09/2019   toe surgery  . LEFT HEART CATHETERIZATION WITH CORONARY ANGIOGRAM N/A 07/11/2012   Procedure: LEFT HEART CATHETERIZATION WITH CORONARY ANGIOGRAM;  Surgeon: Lorretta Harp, MD;  Location: [facility] CATH LAB;  Service: Cardiovascular;  Laterality: N/A;  . REDUCTION MAMMAPLASTY  Bilateral 10+ years ago  . TONSILLECTOMY  ~ 1976  . tubes and ovaries removed  2015  . VAGINAL HYSTERECTOMY  ~ 2009     OB History   No obstetric history on file.     Family History  Problem Relation Age of Onset  . Hiatal hernia Mother   . Hypertension Mother   . Depression Mother   . Allergic rhinitis Mother   . Heart disease Father   . Stroke Maternal Grandfather   . Chronic Renal Failure Paternal Grandmother   . Drug abuse Son   . Allergic rhinitis Brother   . Allergic rhinitis Maternal Aunt     Social History   Tobacco Use  . Smoking status: Never Smoker  . Smokeless tobacco: Never Used  Vaping Use  . Vaping Use: Never used  Substance Use Topics  . Alcohol use: No  . Drug use: No    Home Medications Prior to Admission medications   Medication Sig Start Date End Date Taking? Authorizing Provider  aspirin EC 81 MG tablet Take 1 tablet (81 mg total) by mouth daily. 05/01/19   Belva Crome, MD  clobetasol (TEMOVATE) 0.05 % external solution clobetasol 0.05 % scalp solution  APPLY TO THE SCALP DAILY AS NEEDED FOR [facility]    [provider]  EPINEPHrine (EPIPEN 2-PAK) 0.3 mg/0.3 mL IJ SOAJ injection Inject 0.3 mLs (0.3 mg total) into the muscle as needed. 10/03/19   Garnet Sierras, DO  estradiol (ESTRACE) 1 MG tablet  04/11/19   [provider]  ezetimibe (ZETIA) 10 MG tablet Take 1 tablet (10 mg total) by mouth daily. 07/07/20 07/02/21  Belva Crome, MD  furosemide (LASIX) 20 MG tablet Take 1 tablet (20 mg total) by mouth daily as needed for fluid or edema. 11/17/20   Kathyrn Drown D, NP  ipratropium (ATROVENT) 0.03 % nasal spray Place 1-2 sprays into both nostrils 3 (three) times daily as needed for rhinitis. 01/07/20   Garnet Sierras, DO  ketoconazole (NIZORAL) 2 % shampoo  04/11/19   [provider]  levocetirizine (XYZAL) 5 MG tablet Take 1 tablet (5 mg total) by mouth every evening. 08/29/20   Garnet Sierras, DO  metoprolol tartrate (LOPRESSOR) 100 MG  tablet Take one tablet by mouth 2 hours prior to your CT 04/17/20   Belva Crome, MD  metroNIDAZOLE (METROGEL) 0.75 % gel metronidazole 0.75 % topical gel  APPLY ON THE SKIN DAILY FOR ROSACEA    [provider]  Multiple Vitamins-Calcium (ONE-A-DAY WOMENS PO) Take 1 tablet by mouth daily.    [provider]  olmesartan (BENICAR) 20 MG tablet Take 20 mg by mouth daily. 11/03/18   [provider]  Olopatadine HCl 0.2 % SOLN Apply 1 drop to eye daily as needed (itchy/watery eyes). 01/07/20   Garnet Sierras, DO  ondansetron (ZOFRAN ODT) 4 MG disintegrating tablet Take 1 tablet (4 mg total) by  mouth every 8 (eight) hours as needed for nausea or vomiting. 08/19/17   Francine Graven, DO  RABEprazole (ACIPHEX) 20 MG tablet Take 20 mg by mouth 2 (two) times daily.    [provider]  rizatriptan (MAXALT-MLT) 10 MG disintegrating tablet Take 10 mg by mouth every 2 (two) hours as needed for migraine.  06/18/15   [provider]  rosuvastatin (CRESTOR) 5 MG tablet Take 1 tablet (5 mg total) by mouth daily. 07/07/20 07/07/21  Belva Crome, MD  Vitamin D, Ergocalciferol, (DRISDOL) 50000 units CAPS capsule Take 50,000 Units by mouth every Wednesday.     [provider]    Allergies    Betadine [povidone iodine], Contrast media [iodinated diagnostic agents], Morphine and related, Prednisone, Yellow jacket venom [bee venom], Azithromycin, Erythromycin, Iohexol, Oxycontin [oxycodone], Avelox [moxifloxacin hcl in nacl], Codeine, Gadolinium derivatives, Iodine, Lamisil [terbinafine], Levaquin [levofloxacin in d5w], Oxycodone-acetaminophen, Pantoprazole, Praluent [alirocumab], Pravastatin, Rosuvastatin, Shellfish allergy, Augmentin [amoxicillin-pot clavulanate], Dilaudid [hydromorphone hcl], Oxycodone hcl, and Tramadol  Review of Systems   Review of Systems  Constitutional: Negative for fever.  HENT: Negative for sore throat.   Eyes: Negative for visual disturbance.   Respiratory: Negative for shortness of breath.   Cardiovascular: Negative for chest pain.  Gastrointestinal: Negative for abdominal pain.  Genitourinary: Positive for flank pain. Negative for dysuria.  Musculoskeletal: Positive for back pain.  Skin: Negative for rash.  Neurological: Negative for headaches.    Physical Exam Updated Vital Signs BP 138/86 (BP Location: Right Arm)   Pulse 72   Temp 98.5 F (36.9 C)   Resp 20   Ht 5' (1.524 m)   Wt 83 kg   SpO2 100%   BMI 35.74 kg/m   Physical Exam Vitals and nursing note reviewed.  Constitutional:      General: She is not in acute distress.    Appearance: Normal appearance. She is well-developed.  HENT:     Head: Normocephalic and atraumatic.  Eyes:     Conjunctiva/sclera: Conjunctivae normal.  Cardiovascular:     Rate and Rhythm: Normal rate and regular rhythm.     Heart sounds: No murmur heard.   Pulmonary:     Effort: Pulmonary effort is normal. No respiratory distress.     Breath sounds: Normal breath sounds.  Abdominal:     Palpations: Abdomen is soft.     Tenderness: There is no abdominal tenderness. There is no guarding or rebound.  Musculoskeletal:        General: Normal range of motion.     Cervical back: Neck supple.  Skin:    General: Skin is warm and dry.  Neurological:     General: No focal deficit present.     Mental Status: She is alert.     ED Results / Procedures / Treatments   Labs (all labs ordered are listed, but only abnormal results are displayed) Labs Reviewed  URINALYSIS, ROUTINE W REFLEX MICROSCOPIC - Abnormal; Notable for the following components:      Result Value   Color, Urine COLORLESS (*)    All other components within normal limits  HEPATIC FUNCTION PANEL - Abnormal; Notable for the following components:   AST 14 (*)    All other components within normal limits  CBC  BASIC METABOLIC PANEL    EKG None  Radiology CT Renal Stone Study  Result Date: 12/05/2020 CLINICAL  DATA:  Right flank pain EXAM: CT ABDOMEN AND PELVIS WITHOUT CONTRAST TECHNIQUE: Multidetector CT imaging of the abdomen and  pelvis was performed following the standard protocol without IV contrast. COMPARISON:  CT stone study 06/05/2018 FINDINGS: Lower chest: The lung bases are clear without focal nodule, mass, or airspace disease. Heart size is normal. No significant pleural or pericardial effusion is present. Hepatobiliary: Choose 2 Pancreas: Unremarkable. No pancreatic ductal dilatation or surrounding inflammatory changes. Spleen: Normal in size without focal abnormality. Adrenals/Urinary Tract: Adrenal glands are normal bilaterally. Previously noted right kidney stone is no longer present. No obstruction is present. No hydronephrosis is present. No inflammatory changes are present about the right kidney or ureter. Left kidney and ureter are within normal limits. No stone or mass lesion is present. No obstruction is present. The urinary bladder is within normal limits. Stomach/Bowel: The stomach and duodenum are within normal limits. Small bowel is unremarkable. Terminal ileum is normal. Ascending and transverse colon are within normal limits. Descending and sigmoid colon are normal. Vascular/Lymphatic: No significant vascular findings are present. No enlarged abdominal or pelvic lymph nodes. Reproductive: Status post hysterectomy. No adnexal masses. Other: No abdominal wall hernia or abnormality. No abdominopelvic ascites. Musculoskeletal: Vertebral body heights and alignment are normal. No focal lytic or blastic lesions are present. Bony pelvis is within normal limits. The hips are located and within normal limits bilaterally. IMPRESSION: 1. No acute or focal lesion to explain the patient's symptoms. No right-sided stone or obstruction. 2. Previously noted right kidney stone is no longer present. 3. Status post hysterectomy. Electronically Signed   By: San Morelle M.D.   On: 12/05/2020 12:53     Procedures Procedures   Medications Ordered in ED Medications  sodium chloride 0.9 % bolus 1,000 mL (has no administration in time range)  ketorolac (TORADOL) 30 MG/ML injection 30 mg (has no administration in time range)    ED Course  I have reviewed the triage vital signs and the nursing notes.  Pertinent labs & imaging results that were available during my care of the patient were reviewed by me and considered in my medical decision making (see chart for details).  Clinical Course as of 12/05/20 1745  Fri Dec 05, 2020  1418 Urinalysis LFTs chemistries CBC and CAT scan do not show any obvious explanation for patient's symptoms.  She said she does not want any pain medicine and is comfortable with work-up.  She will follow-up with her doctor.  Return instructions discussed [MB]    Clinical Course User Index [MB] Hayden Rasmussen, MD   MDM Rules/Calculators/A&P                         This patient complains of right upper quadrant abdominal into right flank pain; this involves an extensive number of treatment Options and is a complaint that carries with it a high risk of complications and Morbidity. The differential includes musculoskeletal pain, lipoma edema, pyelonephritis, renal colic,  I ordered, reviewed and interpreted labs, which included CBC with normal white count normal hemoglobin, chemistries normal, LFTs normal, urinalysis unremarkable I ordered medication IV Toradol and IV fluids with improvement in her symptoms I ordered imaging studies which included CT renal and I independently    visualized and interpreted imaging which showed no acute findings  Previous records obtained and reviewed in epic, no recent admissions  After the interventions stated above, I reevaluated the patient and found patient still to be having pain but she does not feel like she needs any further work-up.  I recommended close follow-up with her primary care doctor.  She declined any offer  for pain medication.   Final Clinical Impression(s) / ED Diagnoses Final diagnoses:  Right flank pain    Rx / DC Orders ED Discharge Orders    None       Hayden Rasmussen, MD 12/05/20 1747

## 2020-12-05 NOTE — ED Notes (Signed)
Patient ambulated to the restroom but unable to provide specimen at this time.  Patient assisted to room and given water.  Will continue to monitor.

## 2020-12-05 NOTE — Discharge Instructions (Addendum)
You were seen in the emergency department for evaluation of right flank pain.  You had blood work urinalysis and a CAT scan that did not show an obvious explanation for your pain.  Please follow-up with your primary care doctor.  Return to the emergency department for any worsening or concerning symptoms

## 2020-12-08 ENCOUNTER — Telehealth: Payer: Self-pay

## 2020-12-08 NOTE — Telephone Encounter (Signed)
I returned patient's call and she didn't answer. I LVM for her to call back.

## 2020-12-08 NOTE — Telephone Encounter (Signed)
Patient left a voicemail this morning asking for a call to reschedule her upcoming appointment.

## 2020-12-10 ENCOUNTER — Ambulatory Visit: Payer: Self-pay

## 2020-12-11 ENCOUNTER — Other Ambulatory Visit: Payer: Self-pay

## 2020-12-11 ENCOUNTER — Ambulatory Visit (INDEPENDENT_AMBULATORY_CARE_PROVIDER_SITE_OTHER): Payer: PRIVATE HEALTH INSURANCE | Admitting: *Deleted

## 2020-12-11 DIAGNOSIS — T63441D Toxic effect of venom of bees, accidental (unintentional), subsequent encounter: Secondary | ICD-10-CM | POA: Diagnosis not present

## 2020-12-15 ENCOUNTER — Ambulatory Visit: Payer: PRIVATE HEALTH INSURANCE | Admitting: Adult Health

## 2020-12-16 ENCOUNTER — Ambulatory Visit (INDEPENDENT_AMBULATORY_CARE_PROVIDER_SITE_OTHER): Payer: PRIVATE HEALTH INSURANCE | Admitting: *Deleted

## 2020-12-16 DIAGNOSIS — T63441D Toxic effect of venom of bees, accidental (unintentional), subsequent encounter: Secondary | ICD-10-CM

## 2020-12-17 ENCOUNTER — Ambulatory Visit: Payer: Self-pay

## 2020-12-23 ENCOUNTER — Encounter: Payer: Self-pay | Admitting: Adult Health

## 2020-12-23 ENCOUNTER — Ambulatory Visit: Payer: PRIVATE HEALTH INSURANCE | Admitting: Adult Health

## 2020-12-23 ENCOUNTER — Ambulatory Visit (INDEPENDENT_AMBULATORY_CARE_PROVIDER_SITE_OTHER): Payer: PRIVATE HEALTH INSURANCE

## 2020-12-23 DIAGNOSIS — T63441D Toxic effect of venom of bees, accidental (unintentional), subsequent encounter: Secondary | ICD-10-CM | POA: Diagnosis not present

## 2020-12-24 ENCOUNTER — Other Ambulatory Visit: Payer: Self-pay

## 2020-12-24 ENCOUNTER — Ambulatory Visit: Payer: Self-pay

## 2020-12-24 DIAGNOSIS — T63441D Toxic effect of venom of bees, accidental (unintentional), subsequent encounter: Secondary | ICD-10-CM

## 2020-12-31 ENCOUNTER — Ambulatory Visit: Payer: Self-pay

## 2021-01-01 ENCOUNTER — Ambulatory Visit (INDEPENDENT_AMBULATORY_CARE_PROVIDER_SITE_OTHER): Payer: PRIVATE HEALTH INSURANCE | Admitting: *Deleted

## 2021-01-01 ENCOUNTER — Other Ambulatory Visit: Payer: Self-pay

## 2021-01-01 DIAGNOSIS — T63441D Toxic effect of venom of bees, accidental (unintentional), subsequent encounter: Secondary | ICD-10-CM | POA: Diagnosis not present

## 2021-01-05 ENCOUNTER — Encounter: Payer: Self-pay | Admitting: Allergy

## 2021-01-05 ENCOUNTER — Other Ambulatory Visit: Payer: PRIVATE HEALTH INSURANCE

## 2021-01-05 ENCOUNTER — Ambulatory Visit (INDEPENDENT_AMBULATORY_CARE_PROVIDER_SITE_OTHER): Payer: PRIVATE HEALTH INSURANCE | Admitting: Allergy

## 2021-01-05 ENCOUNTER — Other Ambulatory Visit: Payer: Self-pay

## 2021-01-05 DIAGNOSIS — Z889 Allergy status to unspecified drugs, medicaments and biological substances status: Secondary | ICD-10-CM

## 2021-01-05 DIAGNOSIS — Z91038 Other insect allergy status: Secondary | ICD-10-CM

## 2021-01-05 DIAGNOSIS — T781XXD Other adverse food reactions, not elsewhere classified, subsequent encounter: Secondary | ICD-10-CM

## 2021-01-05 DIAGNOSIS — J310 Chronic rhinitis: Secondary | ICD-10-CM | POA: Diagnosis not present

## 2021-01-05 DIAGNOSIS — E882 Lipomatosis, not elsewhere classified: Secondary | ICD-10-CM | POA: Diagnosis not present

## 2021-01-05 DIAGNOSIS — J3089 Other allergic rhinitis: Secondary | ICD-10-CM | POA: Diagnosis not present

## 2021-01-05 DIAGNOSIS — T7840XD Allergy, unspecified, subsequent encounter: Secondary | ICD-10-CM

## 2021-01-05 MED ORDER — OLOPATADINE HCL 0.2 % OP SOLN: 1.0000 [drp] | Freq: Every day | OPHTHALMIC | 5 refills | Status: DC | PRN

## 2021-01-05 MED ORDER — IPRATROPIUM BROMIDE 0.03 % NA SOLN: 1.0000 | Freq: Two times a day (BID) | NASAL | 5 refills | Status: DC | PRN

## 2021-01-05 MED ORDER — LEVOCETIRIZINE DIHYDROCHLORIDE 5 MG PO TABS: 5.0000 mg | ORAL_TABLET | Freq: Every evening | ORAL | 5 refills | Status: DC

## 2021-01-05 NOTE — Assessment & Plan Note (Signed)
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 Patient was on AIT for 3 years but the last year developed large localized reactions so she stopped the injections. She believes they were helping her symptoms. 2020 immunocap borderline positive to few pollens. Azelastine not effective.  Interim history - increased symptoms since gardening outdoors.   Use Atrovent (ipratropium) 0.03% 1-2 sprays per nostril twice a day as needed for runny nose/drainage.  Nasal saline spray (i.e., Simply Saline) or nasal saline lavage (i.e., NeilMed) is recommended as needed and prior to medicated nasal sprays.  Continue Xyzal 5mg  daily at night.   May use olopatadine eye drops 0.2% once a day as needed for itchy/watery eyes  Continue environmental control measures to pollen.

## 2021-01-05 NOTE — Assessment & Plan Note (Signed)
Past history - Concern regarding hymenoptera allergy as she developed some respiratory symptoms after getting stung by a yellow jacket. Hymenoptera panel was positive to honeybee, white faced hornet, yellow jacket, wasp, yellow hornet and bumblebee. Borderline to fire ant. Interim history - tolerating injections better. No stings since the last visit. She is an avid gardener and has not been stung since the last visit. She does take her epinephrine device outside with her at all times.   Continue mixed vespid injections (contains yellow jacket, yellow hornet and white faced hornet). ? Make sure you take your allergy medication the day of injection. ? If you do well with the build up then will add on honey bee and wasp together, then add on fire ant.   Carry Epipen and use for anaphylactic reactions as needed.

## 2021-01-05 NOTE — Progress Notes (Signed)
RE: [name] [name] MRN: [id number] DOB: [birth year] Date of Telemedicine Visit: 01/05/2021  Referring provider: Janus Molder, NP Primary care provider: Ginger Organ., MD  Chief Complaint: Allergic reaction subsequent encounter   Telemedicine Follow Up Visit via Telephone: I connected with Will Schier for a follow up on 01/05/21 by telephone and verified that I am speaking with the correct person using two identifiers.   I discussed the limitations, risks, security and privacy concerns of performing an evaluation and management service by telephone and the availability of in person appointments. I also discussed with the patient that there may be a patient responsible charge related to this service. The patient expressed understanding and agreed to proceed.  Patient is at home. Provider is at the office.  Visit start time: 11:05AM Visit end time: 11:16AM Insurance consent/check in by: front desk Medical consent and medical assistant/nurse: Delrae Rend W.  History of Present Illness: She is a 55 y.o. 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 Her previous allergy office visit was on 07/17/2020 with Dr. Maudie Mercury via telemedicine. Today is a regular follow up visit.  Hymenoptera allergy Receiving allergy injections with no issues now and tolerating it better.  No stings since the last visit.  Still has Epipen on hand.   Allergic reaction No additional allergic reactions since the last visit. Sometimes gets itchy on the face and not sure what the trigger is.   No issues with mussels and clams.  Avoiding scallops.  Other allergic rhinitis Some sneezing and itchy eyes especially after working in her garden.  Taking Xyzal at night.  Only using Atrovent nasal spray as she can't take steroid nasal sprays.  Dercum  disease Unchanged.  Assessment and Plan: Khadija is a 55 y.o. female with: Hymenoptera allergy Past history - Concern regarding hymenoptera allergy as she developed some respiratory symptoms after getting stung by a yellow jacket. Hymenoptera panel was positive to honeybee, white faced hornet, yellow jacket, wasp, yellow hornet and bumblebee. Borderline to fire ant. Interim history - tolerating injections better. No stings since the last visit. She is an avid gardener and has not been stung since the last visit. She does take her epinephrine device outside with her at all times.   Continue mixed vespid injections (contains yellow jacket, yellow hornet and white faced hornet). ? Make sure you take your allergy medication the day of injection. ? If you do well with the build up then will add on honey bee and wasp together, then add on fire ant.   Carry Epipen and use for anaphylactic reactions as needed.   Allergic reaction Past history - 55 year old female who presents with a myriad of symptoms but concerned about increasing reactions to various things including foods, chemicals, environment, drugs since her diagnosis of Dercum's disease. Bloodwork - hymenoptera panel was positive to honeybee, white faced hornet, yellow jacket, wasp, yellow hornet and bumblebee and fire ant. Shellfish panel and seafood panel were all negative. The scallop and oyster were borderline positive. Tryptase, blood count, urticaria index, and alpha gal were all normal. 2,3 Dinor-11Beta-Prostaglandin F2 Alpha, Urine - 2157pg/mg Cr (ref <5205pg/mg Cr) - normal.  Interim history - no more reactions.   ContinueXyzal 1m at night.  Keep track of reactions. ? If you have a reaction next time, please get tryptase level drawn within 2-3 hours. This will help in identifying if it's  truly an allergic reaction or some other type of adverse reaction that you are having.   For mild symptoms you can take over the counter  antihistamines such as Benadryl and monitor symptoms closely. If symptoms worsen or if you have severe symptoms including breathing issues, throat closure, significant swelling, whole body hives, severe diarrhea and vomiting, lightheadedness then inject epinephrine and seek immediate medical care afterwards.  Emergency action plan in place.   Continue to avoid scallops and red dye.   Adverse food reaction Past history - Currently avoiding gluten and spicy foods as they seem to trigger her Dercum's disease symptoms. Shrimp caused perioral pruritus in the past. 2021 bloodwork scallop 0.12; negative to red dye. Interim history - no issues with other foods.  Continue to avoid scallops and red dye.   For mild symptoms you can take over the counter antihistamines such as Benadryl and monitor symptoms closely. If symptoms worsen or if you have severe symptoms including breathing issues, throat closure, significant swelling, whole body hives, severe diarrhea and vomiting, lightheadedness then inject epinephrine and seek immediate medical care afterwards.  Other allergic rhinitis Past history - Perennial rhino conjunctivitis symptoms for many years with worsening in the fall. Patient was on AIT for 3 years but the last year developed large localized reactions so she stopped the injections. She believes they were helping her symptoms. 2020 immunocap borderline positive to few pollens. Azelastine not effective.  Interim history - increased symptoms since gardening outdoors.   Use Atrovent (ipratropium) 0.03% 1-2 sprays per nostril twice a day as needed for runny nose/drainage.  Nasal saline spray (i.e., Simply Saline) or nasal saline lavage (i.e., NeilMed) is recommended as needed and prior to medicated nasal sprays.  Continue Xyzal 71m daily at night.   May use olopatadine eye drops 0.2% once a day as needed for itchy/watery eyes  Continue environmental control measures to pollen.  Multiple drug  allergies Past history - Patient is sensitive to various medications. Some of the reactions are more adverse drug reactions rather than IgE mediated reactions.  Continue to avoid medications which gave her issues in the past. See allergy list.   Dercum disease Past history - [facility]  Continue follow up with dermatology, neurology, cardi[facility]   Return in about 6 months (around 07/07/2021).  Meds ordered this encounter  Medications  . levocetirizine (XYZAL) 5 MG tablet    Sig: Take 1 tablet (5 mg total) by mouth every evening.    Dispense:  30 tablet    Refill:  5  . ipratropium (ATROVENT) 0.03 % nasal spray    Sig: Place 1-2 sprays into both nostrils 2 (two) times daily as needed for rhinitis.    Dispense:  30 mL    Refill:  5  . Olopatadine HCl 0.2 % SOLN    Sig: Apply 1 drop to eye daily as needed (itchy/watery eyes).    Dispense:  2.5 mL    Refill:  5   Lab Orders  No laboratory test(s) ordered today    Diagnostics: None.  Medication List:  Current Outpatient Medications  Medication Sig Dispense Refill  . aspirin EC 81 MG tablet Take 1 tablet (81 mg total) by mouth daily. 90 tablet 3  . clobetasol (TEMOVATE) 0.05 % external solution clobetasol 0.05 % scalp solution  APPLY TO THE SCALP DAILY AS NEEDED FOR ITCH    . EPINEPHrine (EPIPEN 2-PAK) 0.3 mg/0.3  mL IJ SOAJ injection Inject 0.3 mLs (0.3 mg total) into the muscle as needed. 2 each 2  . estradiol (ESTRACE) 1 MG tablet     . ezetimibe (ZETIA) 10 MG tablet Take 1 tablet (10 mg total) by mouth daily. 30 tablet 11  . furosemide (LASIX) 20 MG tablet Take 1 tablet (20 mg total) by mouth daily as needed for fluid or edema. 30 tablet 3  . ipratropium (ATROVENT) 0.03 % nasal spray Place 1-2 sprays into both nostrils 2 (two) times daily as needed for rhinitis. 30 mL 5  . ketoconazole (NIZORAL) 2 % shampoo     . levothyroxine  (SYNTHROID) 50 MCG tablet Take 50 mcg by mouth daily.    . metroNIDAZOLE (METROGEL) 0.75 % gel metronidazole 0.75 % topical gel  APPLY ON THE SKIN DAILY FOR ROSACEA    . Multiple Vitamins-Calcium (ONE-A-DAY WOMENS PO) Take 1 tablet by mouth daily.    Marland Kitchen olmesartan (BENICAR) 20 MG tablet Take 20 mg by mouth daily.    . ondansetron (ZOFRAN ODT) 4 MG disintegrating tablet Take 1 tablet (4 mg total) by mouth every 8 (eight) hours as needed for nausea or vomiting. 6 tablet 0  . RABEprazole (ACIPHEX) 20 MG tablet Take 20 mg by mouth 2 (two) times daily.    . rizatriptan (MAXALT-MLT) 10 MG disintegrating tablet Take 10 mg by mouth every 2 (two) hours as needed for migraine.   11  . rosuvastatin (CRESTOR) 5 MG tablet Take 1 tablet (5 mg total) by mouth daily. 90 tablet 3  . Vitamin D, Ergocalciferol, (DRISDOL) 50000 units CAPS capsule Take 50,000 Units by mouth every Wednesday.     . levocetirizine (XYZAL) 5 MG tablet Take 1 tablet (5 mg total) by mouth every evening. 30 tablet 5  . Olopatadine HCl 0.2 % SOLN Apply 1 drop to eye daily as needed (itchy/watery eyes). 2.5 mL 5   No current facility-administered medications for this visit.   Allergies: Allergies  Allergen Reactions  . Betadine [Povidone Iodine]     Throat swelling, itchy lips, redness/swelling at application site.  . Contrast Media [Iodinated Diagnostic Agents] Anaphylaxis  . Morphine And Related Anaphylaxis and Nausea And Vomiting  . Prednisone Anaphylaxis  . Yellow Jacket Venom [Bee Venom] Anaphylaxis  . Azithromycin Other (See Comments)    SEVERE STOMACH PAIN   . Erythromycin Nausea And Vomiting and Other (See Comments)    SEVERE STOMACH PAIN/abdominal pain   . Iohexol Hives, Itching and Swelling    Swelling of upper lip, thick tongue, hives on face and back and itching all over  . Oxycontin [Oxycodone] Nausea And Vomiting  . Avelox [Moxifloxacin Hcl In Nacl] Swelling  . Codeine Nausea And Vomiting and Other (See Comments)     Headaches also  . Gadolinium Derivatives Hives, Itching and Swelling  . Iodine   . Lamisil [Terbinafine]     Pt stated, "It made me feel like I had the flu"  . Levaquin [Levofloxacin In D5w] Swelling  . Oxycodone-Acetaminophen Nausea And Vomiting  . Pantoprazole Other (See Comments)    "Feels like I have the flu"  . Praluent [Alirocumab]     Swelling in leg, pain, dizziness, tightness in her throat  . Pravastatin     myalgias  . Rosuvastatin     Myalgias and cramps, sore throat, indigestion  . Shellfish Allergy   . Augmentin [Amoxicillin-Pot Clavulanate] Nausea And Vomiting  . Dilaudid [Hydromorphone Hcl] Nausea And Vomiting  . Oxycodone Hcl Nausea And  Vomiting  . Tramadol Nausea Only   I reviewed her past medical history, social history, family history, and environmental history and no significant changes have been reported from her previous visit.  Review of Systems  Constitutional: Negative for appetite change, chills and fever.  HENT: Positive for sneezing.   Eyes: Positive for itching.  Respiratory: Negative for chest tightness, shortness of breath and wheezing.   Skin: Negative for rash.  Allergic/Immunologic: Positive for environmental allergies.   Objective: Physical Exam Not obtained as encounter was done via telephone.   Previous notes and tests were reviewed.  I discussed the assessment and treatment plan with the patient. The patient was provided an opportunity to ask questions and all were answered. The patient agreed with the plan and demonstrated an understanding of the instructions. After visit summary/patient instructions available via mychart.   The patient was advised to call back or seek an in-person evaluation if the symptoms worsen or if the condition fails to improve as anticipated.  I provided 11 minutes of non-face-to-face time during this encounter.  It was my pleasure to participate in [place] Duff's care today. Please feel free to contact me  with any questions or concerns.   Sincerely,  [name], DO Allergy & Immunology  [facility] office: [place] office: [phone]

## 2021-01-05 NOTE — Assessment & Plan Note (Signed)
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 Shrimp caused perioral pruritus in the past. 2021 bloodwork scallop 0.12; negative to red dye. Interim history - no issues with other foods.  Continue to avoid scallops and red dye.   For mild symptoms you can take over the counter antihistamines such as Benadryl and monitor symptoms closely. If symptoms worsen or if you have severe symptoms including breathing issues, throat closure, significant swelling, whole body hives, severe diarrhea and vomiting, lightheadedness then inject epinephrine and seek immediate medical care afterwards.

## 2021-01-05 NOTE — Assessment & Plan Note (Signed)
Past history - 55 year old female who presents with a myriad of symptoms but concerned about increasing reactions to various things including foods, chemicals, environment, drugs since her diagnosis of Dercum's disease. Bloodwork - hymenoptera panel was positive to honeybee, white faced hornet, yellow jacket, wasp, yellow hornet and bumblebee and fire ant. Shellfish panel and seafood panel were all negative. The scallop and oyster were borderline positive. Tryptase, blood count, urticaria index, and alpha gal were all normal. 2,3 Dinor-11Beta-Prostaglandin F2 Alpha, Urine - 2157pg/mg Cr (ref <5205pg/mg Cr) - normal.  Interim history - no more reactions.   ContinueXyzal 57m at night.  Keep track of reactions. ? If you have a reaction next time, please get tryptase level drawn within 2-3 hours. This will help in identifying if it's truly an allergic reaction or some other type of adverse reaction that you are having.   For mild symptoms you can take over the counter antihistamines such as Benadryl and monitor symptoms closely. If symptoms worsen or if you have severe symptoms including breathing issues, throat closure, significant swelling, whole body hives, severe diarrhea and vomiting, lightheadedness then inject epinephrine and seek immediate medical care afterwards.  Emergency action plan in place.   Continue to avoid scallops and red dye.

## 2021-01-05 NOTE — Assessment & Plan Note (Signed)
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  Continue follow up with dermatology, neurology, cardiology.  [facility] appointment.

## 2021-01-05 NOTE — Patient Instructions (Addendum)
Allergic reaction  Continue to avoid scallops and red dye.  Continuexyzal 5mg  at night.  Keep track of reactions. ? If you have a reaction next time, please get tryptase level drawn within 2-3 hours. This will help in identifying if it's truly an allergic reaction or some other type of adverse reaction that you are having.   For mild symptoms you can take over the counter antihistamines such as Benadryl and monitor symptoms closely. If symptoms worsen or if you have severe symptoms including breathing issues, throat closure, significant swelling, whole body hives, severe diarrhea and vomiting, lightheadedness then inject epinephrine and seek immediate medical care afterwards.  Emergency action plan in place.   Hymenoptera allergy  Continue mixed vespid injections (contains yellow jacket, yellow hornet and white faced hornet). ? Make sure you take your allergy medication the day of injection. ? If you do well with the build up then will add on honey bee and wasp together, then add on fire ant.   Carry Epipen and use for anaphylactic reactions as needed.   Other allergic rhinitis  2020 immunocap borderline positive to few pollens.   Use Atrovent (ipratropium) 0.03% 1-2 sprays per nostril twice a day as needed for runny nose/drainage.  Nasal saline spray (i.e., Simply Saline) or nasal saline lavage (i.e., NeilMed) is recommended as needed and prior to medicated nasal sprays.   Continue xyzal 5mg  daily at night.   May use olopatadine eye drops 0.2% once a day as needed for itchy/watery eyes  Continue environmental control measures to pollen.  Multiple drug allergies  Continue to avoid medications on allergy list.    Adverse food reaction  Continue to avoid scallops and red dye.   For mild symptoms you can take over the counter antihistamines such as Benadryl and monitor symptoms closely. If symptoms worsen or if you have severe symptoms including breathing issues, throat  closure, significant swelling, whole body hives, severe diarrhea and vomiting, lightheadedness then inject epinephrine and seek immediate medical care afterwards.  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  [facility] appointment.   Return in about 6 months or sooner if needed.   Reducing Pollen Exposure . Pollen seasons: trees (spring), grass (summer) and ragweed/weeds (fall). Marland Kitchen Keep windows closed in your home and car to lower pollen exposure.  Susa Simmonds air conditioning in the bedroom and throughout the house if possible.  . Avoid going out in dry windy days - especially early morning. . Pollen counts are highest between 5 - 10 AM and on dry, hot and windy days.  . Save outside activities for late afternoon or after a heavy rain, when pollen levels are lower.  . Avoid mowing of grass if you have grass pollen allergy. Marland Kitchen Be aware that pollen can also be transported indoors on people and pets.  . Dry your clothes in an automatic dryer rather than hanging them outside where they might collect pollen.  . Rinse hair and eyes before bedtime.

## 2021-01-05 NOTE — Assessment & Plan Note (Signed)
Past history - Patient is sensitive to various medications. Some of the reactions are more adverse drug reactions rather than IgE mediated reactions. Continue to avoid medications which gave her issues in the past. See allergy list.  

## 2021-01-07 ENCOUNTER — Ambulatory Visit (INDEPENDENT_AMBULATORY_CARE_PROVIDER_SITE_OTHER): Payer: PRIVATE HEALTH INSURANCE

## 2021-01-07 ENCOUNTER — Telehealth: Payer: Self-pay

## 2021-01-07 DIAGNOSIS — T63441D Toxic effect of venom of bees, accidental (unintentional), subsequent encounter: Secondary | ICD-10-CM | POA: Diagnosis not present

## 2021-01-07 NOTE — Telephone Encounter (Signed)
Patient came in for her venom injection today. Patient did not have an appointment and stated that she never makes an appointment for her injections due to a friend being very ill and having multiple appointments daily and traveling up the road multiple times a day. I advised patient that I saw nothing document that she was a walk in venom patient and that she indeed had an appointment scheduled for the next day 01/08/21. Patient became defensive and stated that I was calling her a liar. I informed her that I truly was not trying to offend her in any way nor did I want to make her feel as though she couldn't be trusted. I advised her that we have a protocol to follow and due to her being on build up she has to have her vial made and that it can be time consuming and that with the ruses the injection room has at times she may not be attempted to in a appropriate amount of time therefore we have the appointment slots to ensure every patient is attended to in an appropriate amount of time. Patient then began to explain that she only has five minuets to spare and that is her time slot to get her injection. I informed her that it will indeed take me longer than 5 minutes to mix her venoms and administer to her in a safe manor. Patient was not pleased and stated that she wanted her injection so that she can tend to her ill friend. I did speak with Dr. Lynnda Shields and was given the approval to administer her injections. I informed patient that in the future it may be helpful for her to call to inform the injection room employee that she is on the way to decrease her wait time. Please advise on how and where to document that patient is a walk in venom patient.

## 2021-01-08 ENCOUNTER — Ambulatory Visit: Payer: Self-pay

## 2021-01-16 ENCOUNTER — Telehealth: Payer: Self-pay

## 2021-01-16 ENCOUNTER — Ambulatory Visit (INDEPENDENT_AMBULATORY_CARE_PROVIDER_SITE_OTHER): Payer: PRIVATE HEALTH INSURANCE

## 2021-01-16 DIAGNOSIS — T63441D Toxic effect of venom of bees, accidental (unintentional), subsequent encounter: Secondary | ICD-10-CM | POA: Diagnosis not present

## 2021-01-16 NOTE — Telephone Encounter (Signed)
Patient had her venom injection today and got 0.10 mL of her mixed vespid of 0.03 mL of dosage. Patient came back in an hour later and complained of chest tightness, itching, and shortness of breath. Patient was brought back to Room 2 and began vitals and Dr. Nelva Bush and Eustaquio Maize were notified.  Dr. Nelva Bush accessed and called for epi and Xyzal. Patient appears to be in a calm state and states that she feels slightly better. Vitals were taking every 5 minutes and she was monitored by myself, Beth, and Dr. Nelva Bush. Thirty minutes into her reaction patient states she feels tired. I asked patient if she needed to call someone to pick her up and take her home she insisted she would be fine. Patient also stated that she stayed in the car for a few minutes and then left to do finish her day out when she began to feel itchy and tight chested and then she decided to come back to the office to be evaluated. 60 minutes after initial reaction patient states that she if feeling much better and feels almost herself. Upon discharge patient was instructed to call the on call provider with any changes. Vitals were taken they were normal. Patient stated that she felt 100 percent better and was talking and functioning well was she did when she initially came for her injection.

## 2021-01-16 NOTE — Progress Notes (Addendum)
Patient had her venom injection today and got 0.10 mL of her mixed vespid of 0.03 mL of dosage. Patient came back in an hour later and complained of chest tightness, itching, and shortness of breath. Patient was brought back to Room 2 and began vitals and Dr. Nelva Bush and Eustaquio Maize were notified.  Dr. Nelva Bush accessed and called for epi and Xyzal. Patient appears to be in a calm state and states that she feels slightly better. Vitals were taking every 5 minutes and she was monitored by myself, Beth, and Dr. Nelva Bush. Thirty minutes into her reaction patient states she feels tired. I asked patient if she needed to call someone to pick her up and take her home she insisted she would be fine. Patient also stated that she stayed in the car for a few minutes and then left to do finish her day out when she began to feel itchy and tight chested and then she decided to come back to the office to be evaluated. 60 minutes after initial reaction patient states that she if feeling much better and feels almost herself. Upon discharge patient was instructed to call the on call provider with any changes. Vitals were taken they were normal. Patient stated that she felt 100 percent better and was talking and functioning well was she did when she initially came for her injection.

## 2021-01-19 ENCOUNTER — Telehealth: Payer: Self-pay

## 2021-01-19 NOTE — Telephone Encounter (Signed)
Called the patient back and she stated that she is doing good. No further symptoms or reactions since her shot last Friday.

## 2021-01-19 NOTE — Telephone Encounter (Signed)
Called the patient to see how she is doing since her allergy shot reaction. I was not able to leave a message due to her mailbox being full. I will call back again before I leave today.

## 2021-01-19 NOTE — Telephone Encounter (Signed)
If patient comes in before 6/27 for her next injection - please restart at strength 0.35mcg/ml (mixed vespid) at 0.07mL. Make sure she has taken her allergy medications beforehand.  Thank you.

## 2021-01-23 ENCOUNTER — Ambulatory Visit: Payer: Self-pay

## 2021-01-26 ENCOUNTER — Ambulatory Visit: Payer: Self-pay

## 2021-01-26 ENCOUNTER — Other Ambulatory Visit: Payer: PRIVATE HEALTH INSURANCE

## 2021-02-04 ENCOUNTER — Ambulatory Visit: Payer: Self-pay

## 2021-02-09 ENCOUNTER — Ambulatory Visit (INDEPENDENT_AMBULATORY_CARE_PROVIDER_SITE_OTHER): Payer: No Typology Code available for payment source

## 2021-02-09 DIAGNOSIS — T63441D Toxic effect of venom of bees, accidental (unintentional), subsequent encounter: Secondary | ICD-10-CM | POA: Diagnosis not present

## 2021-02-16 ENCOUNTER — Other Ambulatory Visit: Payer: No Typology Code available for payment source | Admitting: *Deleted

## 2021-02-16 ENCOUNTER — Ambulatory Visit (INDEPENDENT_AMBULATORY_CARE_PROVIDER_SITE_OTHER): Payer: No Typology Code available for payment source | Admitting: *Deleted

## 2021-02-16 ENCOUNTER — Other Ambulatory Visit: Payer: Self-pay

## 2021-02-16 DIAGNOSIS — T63441D Toxic effect of venom of bees, accidental (unintentional), subsequent encounter: Secondary | ICD-10-CM | POA: Diagnosis not present

## 2021-02-16 DIAGNOSIS — E7801 Familial hypercholesterolemia: Secondary | ICD-10-CM

## 2021-02-17 ENCOUNTER — Telehealth: Payer: Self-pay | Admitting: Pharmacist

## 2021-02-17 LAB — LIPOPROTEIN A (LPA): Lipoprotein (a): 235.2 nmol/L — ABNORMAL HIGH (ref <75.0)

## 2021-02-17 LAB — NMR, LIPOPROFILE
Cholesterol, Total: 172 mg/dL (ref 100–199)
HDL Particle Number: 53.5 umol/L (ref >=30.5)
HDL-C: 60 mg/dL (ref >39)
LDL Particle Number: 1146 nmol/L — ABNORMAL HIGH (ref <1000)
LDL Size: 20.7 nm (ref >20.5)
LDL-C (NIH Calc): 76 mg/dL (ref 0–99)
LP-IR Score: 59 — ABNORMAL HIGH (ref <=45)
Small LDL Particle Number: 602 nmol/L — ABNORMAL HIGH (ref <=527)
Triglycerides: 220 mg/dL — ABNORMAL HIGH (ref 0–149)

## 2021-02-17 LAB — HEPATIC FUNCTION PANEL
ALT: 13 IU/L (ref 0–32)
AST: 10 IU/L (ref 0–40)
Albumin: 4.4 g/dL (ref 3.8–4.9)
Alkaline Phosphatase: 68 IU/L (ref 44–121)
Bilirubin Total: 0.3 mg/dL (ref 0.0–1.2)
Bilirubin, Direct: 0.11 mg/dL (ref 0.00–0.40)
Total Protein: 6.5 g/dL (ref 6.0–8.5)

## 2021-02-17 LAB — APOLIPOPROTEIN B: Apolipoprotein B: 82 mg/dL (ref <90)

## 2021-02-17 NOTE — Telephone Encounter (Addendum)
Spent 30 minutes on phone with pt discussing lab results. She is taking her rosuvastatin 5mg  daily and ezetimibe 10mg  daily. Previously intolerant to rosuvastatin 10mg  daily, pravastatin 20mg  daily, and Praluent injections. LDL cholesterol close to goal < 70, however her LDL particle # remains elevated far above goal < 700 - aggressive goals due to family hx of premature CAD and her elevated Lp(a) as an additional risk factor. She is willing to try Vascepa to lower her TG and CV risk, and will think about rechallenging with Repatha about a month later. This would lower her LDL particle # goal and can lower Lp(a) by 20-30%. Will call pt at that time to follow up with tolerability and schedule lab follow up. She does have shellfish allergy noted - reports this is just to scallops, eats fish without issue.  Prior authorization submitted for icosapent ethyl (brand Vascepa not covered on formulary) and [place].

## 2021-02-18 ENCOUNTER — Telehealth: Payer: Self-pay | Admitting: Interventional Cardiology

## 2021-02-18 MED ORDER — ICOSAPENT ETHYL 1 G PO CAPS: 2.0000 g | ORAL_CAPSULE | Freq: Two times a day (BID) | ORAL | 3 refills | Status: DC

## 2021-02-18 NOTE — Telephone Encounter (Signed)
Spoke with pt and made her aware that PCP did place a referral but I see that it has been denied.  Advised she should reach out to PCP office to find out why the appt was denied.  Pt appreciative for assistance.

## 2021-02-18 NOTE — Telephone Encounter (Signed)
Pt would like to know if you could refer her to an Endocrinologist.. thyroid not working , pt states that pcp was supposed to do this but hasnt heard anything back... please advise

## 2021-02-18 NOTE — Telephone Encounter (Signed)
Generic Vascepa and Repatha both approved by pt's insurance through 2039. Rx sent for icosapent ethyl to pharmacy, 90 day supply has $20 copay. Called pt to make her aware. She had other questions about her thyroid, states she's having swelling in her hands and feet since starting levothyroxine. Advised her to call managing provider.  I'll call pt in 1 month to follow up with med tolerability, will plan to add Repatha at that time.

## 2021-02-23 ENCOUNTER — Ambulatory Visit: Payer: Self-pay

## 2021-03-13 ENCOUNTER — Emergency Department (HOSPITAL_COMMUNITY): Payer: PRIVATE HEALTH INSURANCE

## 2021-03-13 ENCOUNTER — Encounter (HOSPITAL_COMMUNITY): Payer: Self-pay | Admitting: *Deleted

## 2021-03-13 ENCOUNTER — Emergency Department (HOSPITAL_COMMUNITY)
Admission: EM | Admit: 2021-03-13 | Discharge: 2021-03-13 | Disposition: A | Discharge status: Home / Self Care | Payer: PRIVATE HEALTH INSURANCE | Attending: Emergency Medicine | Admitting: Emergency Medicine

## 2021-03-13 DIAGNOSIS — M545 Low back pain, unspecified: Secondary | ICD-10-CM | POA: Diagnosis present

## 2021-03-13 DIAGNOSIS — R109 Unspecified abdominal pain: Secondary | ICD-10-CM | POA: Insufficient documentation

## 2021-03-13 DIAGNOSIS — Z79899 Other long term (current) drug therapy: Secondary | ICD-10-CM | POA: Insufficient documentation

## 2021-03-13 DIAGNOSIS — I10 Essential (primary) hypertension: Secondary | ICD-10-CM | POA: Insufficient documentation

## 2021-03-13 DIAGNOSIS — J45909 Unspecified asthma, uncomplicated: Secondary | ICD-10-CM | POA: Insufficient documentation

## 2021-03-13 DIAGNOSIS — Z7982 Long term (current) use of aspirin: Secondary | ICD-10-CM | POA: Diagnosis not present

## 2021-03-13 DIAGNOSIS — M5126 Other intervertebral disc displacement, lumbar region: Secondary | ICD-10-CM | POA: Insufficient documentation

## 2021-03-13 DIAGNOSIS — M5136 Other intervertebral disc degeneration, lumbar region: Secondary | ICD-10-CM

## 2021-03-13 HISTORY — DX: Lipomatosis, not elsewhere classified: E88.2

## 2021-03-13 LAB — COMPREHENSIVE METABOLIC PANEL
ALT: 13 U/L (ref 0–44)
AST: 21 U/L (ref 15–41)
Albumin: 3.9 g/dL (ref 3.5–5.0)
Alkaline Phosphatase: 53 U/L (ref 38–126)
Anion gap: 8 (ref 5–15)
BUN: 13 mg/dL (ref 6–20)
CO2: 25 mmol/L (ref 22–32)
Calcium: 8.7 mg/dL — ABNORMAL LOW (ref 8.9–10.3)
Chloride: 108 mmol/L (ref 98–111)
Creatinine, Ser: 0.8 mg/dL (ref 0.44–1.00)
GFR, Estimated: >60 mL/min (ref >60)
Glucose, Bld: 101 mg/dL — ABNORMAL HIGH (ref 70–99)
Potassium: 4.4 mmol/L (ref 3.5–5.1)
Sodium: 141 mmol/L (ref 135–145)
Total Bilirubin: 0.8 mg/dL (ref 0.3–1.2)
Total Protein: 6.7 g/dL (ref 6.5–8.1)

## 2021-03-13 LAB — URINALYSIS, ROUTINE W REFLEX MICROSCOPIC
Bilirubin Urine: NEGATIVE
Glucose, UA: NEGATIVE mg/dL
Hgb urine dipstick: NEGATIVE
Ketones, ur: NEGATIVE mg/dL
Leukocytes,Ua: NEGATIVE
Nitrite: NEGATIVE
Protein, ur: NEGATIVE mg/dL
Specific Gravity, Urine: 1.021 (ref 1.005–1.030)
pH: 5 (ref 5.0–8.0)

## 2021-03-13 LAB — CBC WITH DIFFERENTIAL/PLATELET
Abs Immature Granulocytes: 0.02 10*3/uL (ref 0.00–0.07)
Basophils Absolute: 0 10*3/uL (ref 0.0–0.1)
Basophils Relative: 0 %
Eosinophils Absolute: 0.1 10*3/uL (ref 0.0–0.5)
Eosinophils Relative: 1 %
HCT: 40.5 % (ref 36.0–46.0)
Hemoglobin: 12.9 g/dL (ref 12.0–15.0)
Immature Granulocytes: 0 %
Lymphocytes Relative: 33 %
Lymphs Abs: 2.5 10*3/uL (ref 0.7–4.0)
MCH: 30.6 pg (ref 26.0–34.0)
MCHC: 31.9 g/dL (ref 30.0–36.0)
MCV: 96.2 fL (ref 80.0–100.0)
Monocytes Absolute: 0.4 10*3/uL (ref 0.1–1.0)
Monocytes Relative: 5 %
Neutro Abs: 4.6 10*3/uL (ref 1.7–7.7)
Neutrophils Relative %: 61 %
Platelets: 227 10*3/uL (ref 150–400)
RBC: 4.21 MIL/uL (ref 3.87–5.11)
RDW: 13.2 % (ref 11.5–15.5)
WBC: 7.6 10*3/uL (ref 4.0–10.5)
nRBC: 0 % (ref 0.0–0.2)

## 2021-03-13 MED ORDER — FENTANYL CITRATE (PF) 100 MCG/2ML IJ SOLN
50.0000 ug | Freq: Once | INTRAMUSCULAR | Status: AC
Start: 2021-03-13 — End: 2021-03-13
  Administered 2021-03-13: 50 ug via INTRAVENOUS
  Filled 2021-03-13: qty 2

## 2021-03-13 MED ORDER — PREDNISONE 20 MG PO TABS
60.0000 mg | ORAL_TABLET | Freq: Once | ORAL | Status: DC
  Administered 2021-03-13: 60 mg via ORAL
  Filled 2021-03-13: qty 3

## 2021-03-13 MED ORDER — ONDANSETRON 4 MG PO TBDP: 4.0000 mg | ORAL_TABLET | Freq: Three times a day (TID) | ORAL | 0 refills | Status: DC | PRN

## 2021-03-13 MED ORDER — METHYLPREDNISOLONE SODIUM SUCC 125 MG IJ SOLR
60.0000 mg | Freq: Once | INTRAMUSCULAR | Status: AC
  Administered 2021-03-13: 60 mg via INTRAVENOUS
  Filled 2021-03-13: qty 2

## 2021-03-13 MED ORDER — KETOROLAC TROMETHAMINE 15 MG/ML IJ SOLN
15.0000 mg | Freq: Once | INTRAMUSCULAR | Status: AC
  Administered 2021-03-13: 15 mg via INTRAVENOUS
  Filled 2021-03-13: qty 1

## 2021-03-13 MED ORDER — ONDANSETRON HCL 4 MG/2ML IJ SOLN
4.0000 mg | Freq: Once | INTRAMUSCULAR | Status: AC
  Administered 2021-03-13: 4 mg via INTRAVENOUS
  Filled 2021-03-13: qty 2

## 2021-03-13 MED ORDER — PREDNISONE 10 MG (21) PO TBPK: ORAL_TABLET | Freq: Every day | ORAL | 0 refills | Status: DC

## 2021-03-13 MED ORDER — FENTANYL CITRATE (PF) 100 MCG/2ML IJ SOLN
50.0000 ug | Freq: Once | INTRAMUSCULAR | Status: AC
  Administered 2021-03-13: 50 ug via INTRAVENOUS
  Filled 2021-03-13: qty 2

## 2021-03-13 MED ORDER — HYDROCODONE-ACETAMINOPHEN 7.5-325 MG/15ML PO SOLN: 10.0000 mL | Freq: Four times a day (QID) | ORAL | 0 refills | Status: AC | PRN

## 2021-03-13 NOTE — ED Triage Notes (Signed)
Pt complains of right flank pain radiating down her right leg. She reports weakness and numbness in right leg. She's been seeing PT was doing some manipulation on Wednesday and pain has been worse since.

## 2021-03-13 NOTE — Discharge Instructions (Addendum)
Follow-up with the neurosurgery clinic.  Contact their office to request appointment next week.  Recommend course of steroids.  Take Tylenol and Motrin as needed for pain control.  For breakthrough pain take Percocet as needed.  Note this can make you drowsy and should not be taken while driving or operating heavy machinery.

## 2021-03-13 NOTE — ED Notes (Signed)
Pt was taken to MRI

## 2021-03-13 NOTE — ED Provider Notes (Signed)
[facility] DEPT Provider Note   CSN: [id number] Arrival date & time: 03/13/21  1200     History Chief Complaint  Patient presents with   Flank Pain   Leg Pain    [name] [name] is a 55 y.o. female.  Presents to ER with concern for right low back pain.  Has been dealing with this pain for the past couple months, seem to be getting better but then on Wednesday after a treatment with PTA she has had significant worsening of the pain.  Pain is sharp, stabbing, radiating from right low back to her right leg.  Also feels weak and somewhat tingly.  No bladder or bowel incontinence.  Worse with certain movements.  Improved with rest.  HPI     Past Medical History:  Diagnosis Date   Anemia    many years ago   Anxiety    situational   Arthritis    "hands" (07/12/2012)   Asthma    seasonal    Chest pain    Chest pain at rest, on going 07/12/2012   Dercum's disease    Eczema    Family history of early CAD 07/12/2012   Fibromyalgia    GERD (gastroesophageal reflux disease)    Headache(784.0)    "often; not daily" (07/12/2012)   Hypertension    not on medications   Kidney stones    Migraine    Pneumonia    as a child   PONV (postoperative nausea and vomiting)    she states she gets very sick    Patient Active Problem List   Diagnosis Date Noted   OSA (obstructive sleep apnea) 12/05/2019   Lymphedema in adult patient 12/05/2019   Bacterial vaginosis 09/19/2019   Candidal vulvovaginitis 09/19/2019   Dysuria 09/19/2019   Urine finding 09/19/2019   Vitamin D deficiency 09/19/2019   Other allergic rhinitis 05/21/2019   Dercum disease 05/21/2019   Adverse food reaction 05/21/2019   Coughing 05/21/2019   Allergic reaction 05/17/2019   Hymenoptera allergy 05/17/2019   Multiple drug allergies 05/17/2019   Upper airway cough syndrome 02/06/2019   DOE (dyspnea on exertion) 02/06/2019   Morbid obesity ([place]) 11/28/2018   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   Tinnitus aurium, unspecified laterality 11/28/2018   New daily persistent headache 11/28/2018   OSA on CPAP 11/28/2018   Acquired trigger finger of left index finger 08/29/2017   Pain in right hand 08/29/2017   Trigger finger of right hand 08/29/2017   Lipoedema 03/14/2017   Blurring of vision 03/14/2017   Class 2 obesity with body mass index (BMI) of 38.0 to 38.9 in adult 03/14/2017   Fibromyalgia 11/09/2016   History of migraine 11/09/2016   History of environmental allergies 11/09/2016   Abdominal apron 10/21/2016   Myalgia 05/13/2016   Adjustment insomnia 05/13/2016   Snoring 05/13/2016   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   Hyperlipidemia 11/03/2014   Pelvic pain in female 07/30/2014   Chest pain at rest, on going, probable GI source 07/12/2012   HTN (hypertension) 07/12/2012   Family history of abdominal aortic aneurysm (AAA) 07/12/2012   Anxiety 07/12/2012    Past Surgical History:  Procedure Laterality Date   breast lift     CARDIAC CATHETERIZATION  07/11/2012   CHOLECYSTECTOMY N/A 09/18/2015   Procedure: LAPAROSCOPIC CHOLECYSTECTOMY;  Surgeon: Coralie Keens, MD;  Location: [facility];  Service: General;  Laterality: N/A;   FOOT SURGERY  09/2019   toe surgery   LEFT  HEART CATHETERIZATION WITH CORONARY ANGIOGRAM N/A 07/11/2012   Procedure: LEFT HEART CATHETERIZATION WITH CORONARY ANGIOGRAM;  Surgeon: Lorretta Harp, MD;  Location: [facility] CATH LAB;  Service: Cardiovascular;  Laterality: N/A;   REDUCTION MAMMAPLASTY Bilateral 10+ years ago   TONSILLECTOMY  ~ 1976   tubes and ovaries removed  2015   VAGINAL HYSTERECTOMY  ~ 2009     OB History   No obstetric history on file.     Family History  Problem Relation Age of Onset   Hiatal hernia Mother    Hypertension Mother    Depression Mother    Allergic rhinitis Mother    Heart disease Father    Stroke Maternal Grandfather    Chronic Renal Failure Paternal Grandmother    Drug  abuse Son    Allergic rhinitis Brother    Allergic rhinitis Maternal Aunt     Social History   Tobacco Use   Smoking status: Never   Smokeless tobacco: Never  Vaping Use   Vaping Use: Never used  Substance Use Topics   Alcohol use: No   Drug use: No    Home Medications Prior to Admission medications   Medication Sig Start Date End Date Taking? Authorizing Provider  aspirin EC 81 MG tablet Take 1 tablet (81 mg total) by mouth daily. 05/01/19   Belva Crome, MD  clobetasol (TEMOVATE) 0.05 % external solution clobetasol 0.05 % scalp solution  APPLY TO THE SCALP DAILY AS NEEDED FOR [facility]    [provider]  EPINEPHrine (EPIPEN 2-PAK) 0.3 mg/0.3 mL IJ SOAJ injection Inject 0.3 mLs (0.3 mg total) into the muscle as needed. 10/03/19   Garnet Sierras, DO  estradiol (ESTRACE) 1 MG tablet  04/11/19   [provider]  ezetimibe (ZETIA) 10 MG tablet Take 1 tablet (10 mg total) by mouth daily. 07/07/20 07/02/21  Belva Crome, MD  furosemide (LASIX) 20 MG tablet Take 1 tablet (20 mg total) by mouth daily as needed for fluid or edema. 11/17/20   Tommie Raymond, NP  icosapent Ethyl (VASCEPA) 1 g capsule Take 2 capsules (2 g total) by mouth 2 (two) times daily. 02/18/21   Belva Crome, MD  ipratropium (ATROVENT) 0.03 % nasal spray Place 1-2 sprays into both nostrils 2 (two) times daily as needed for rhinitis. 01/05/21   Garnet Sierras, DO  ketoconazole (NIZORAL) 2 % shampoo  04/11/19   [provider]  levocetirizine (XYZAL) 5 MG tablet Take 1 tablet (5 mg total) by mouth every evening. 01/05/21   Garnet Sierras, DO  levothyroxine (SYNTHROID) 50 MCG tablet Take 50 mcg by mouth daily. 12/10/20   [provider]  metroNIDAZOLE (METROGEL) 0.75 % gel metronidazole 0.75 % topical gel  APPLY ON THE SKIN DAILY FOR ROSACEA    [provider]  Multiple Vitamins-Calcium (ONE-A-DAY WOMENS PO) Take 1 tablet by mouth daily.    [provider]  olmesartan (BENICAR) 20 MG  tablet Take 20 mg by mouth daily. 11/03/18   [provider]  Olopatadine HCl 0.2 % SOLN Apply 1 drop to eye daily as needed (itchy/watery eyes). 01/05/21   Garnet Sierras, DO  ondansetron (ZOFRAN ODT) 4 MG disintegrating tablet Take 1 tablet (4 mg total) by mouth every 8 (eight) hours as needed for nausea or vomiting. 08/19/17   Francine Graven, DO  RABEprazole (ACIPHEX) 20 MG tablet Take 20 mg by mouth 2 (two) times daily.    [provider]  rizatriptan (  MAXALT-MLT) 10 MG disintegrating tablet Take 10 mg by mouth every 2 (two) hours as needed for migraine.  06/18/15   [provider]  rosuvastatin (CRESTOR) 5 MG tablet Take 1 tablet (5 mg total) by mouth daily. 07/07/20 07/07/21  Belva Crome, MD  Vitamin D, Ergocalciferol, (DRISDOL) 50000 units CAPS capsule Take 50,000 Units by mouth every Wednesday.     [provider]    Allergies    Betadine [povidone iodine], Contrast media [iodinated diagnostic agents], Morphine and related, Prednisone, Yellow jacket venom [bee venom], Azithromycin, Erythromycin, Iohexol, Oxycontin [oxycodone], Avelox [moxifloxacin hcl in nacl], Codeine, Gadolinium derivatives, Iodine, Lamisil [terbinafine], Levaquin [levofloxacin in d5w], Oxycodone-acetaminophen, Pantoprazole, Praluent [alirocumab], Pravastatin, Rosuvastatin, Shellfish allergy, Augmentin [amoxicillin-pot clavulanate], Dilaudid [hydromorphone hcl], Oxycodone hcl, and Tramadol  Review of Systems   Review of Systems  Constitutional:  Negative for chills and fever.  HENT:  Negative for ear pain and sore throat.   Eyes:  Negative for pain and visual disturbance.  Respiratory:  Negative for cough and shortness of breath.   Cardiovascular:  Negative for chest pain and palpitations.  Gastrointestinal:  Negative for abdominal pain and vomiting.  Genitourinary:  Positive for flank pain. Negative for dysuria and hematuria.  Musculoskeletal:  Positive for back pain. Negative for  arthralgias.  Skin:  Negative for color change and rash.  Neurological:  Negative for seizures and syncope.  All other systems reviewed and are negative.  Physical Exam Updated Vital Signs BP (!) 146/89   Pulse 72   Temp 98.4 F (36.9 C) (Oral)   Resp 16   SpO2 98%   Physical Exam Vitals and nursing note reviewed.  Constitutional:      General: She is not in acute distress.    Appearance: She is well-developed.  HENT:     Head: Normocephalic and atraumatic.  Eyes:     Conjunctiva/sclera: Conjunctivae normal.  Cardiovascular:     Comments: Warm and well-perfused Pulmonary:     Effort: Pulmonary effort is normal. No respiratory distress.  Abdominal:     Palpations: Abdomen is soft.     Tenderness: There is no abdominal tenderness.  Musculoskeletal:     Cervical back: Neck supple.     Comments: Some tenderness to palpation in the right lumbar region, no step-off or deformity appreciated  Skin:    General: Skin is warm and dry.  Neurological:     General: No focal deficit present.     Mental Status: She is alert.     Comments: 5 out of 5 strength in bilateral lower extremities, sensation to light touch intact in bilateral lower extremities  Psychiatric:        Mood and Affect: Mood normal.        Behavior: Behavior normal.    ED Results / Procedures / Treatments   Labs (all labs ordered are listed, but only abnormal results are displayed) Labs Reviewed  URINALYSIS, ROUTINE W REFLEX MICROSCOPIC - Abnormal; Notable for the following components:      Result Value   APPearance HAZY (*)    All other components within normal limits  CBC WITH DIFFERENTIAL/PLATELET  COMPREHENSIVE METABOLIC PANEL    EKG None  Radiology MR LUMBAR SPINE WO CONTRAST  Result Date: 03/13/2021 CLINICAL DATA:  Low back pain with right leg weakness. EXAM: MRI LUMBAR SPINE WITHOUT CONTRAST TECHNIQUE: Multiplanar, multisequence MR imaging of the lumbar spine was performed. No intravenous  contrast was administered. COMPARISON:  None. FINDINGS: Segmentation:  Standard. Alignment:  Physiologic.  Vertebrae:  No fracture, evidence of discitis, or bone lesion. Conus medullaris and cauda equina: Conus extends to the L1 level. Conus and cauda equina appear normal. Paraspinal and other soft tissues: Negative. Disc levels: T12-L1: Seen only on the sagittal images, unremarkable. L1-L2: No disc bulge. No spinal canal stenosis or neural foraminal narrowing. L2-L3: Mild facet arthropathy. No significant disc bulge. No spinal canal stenosis or neural foraminal narrowing. L3-L4: Mild broad-based disc bulge. Mild facet arthropathy. No spinal canal stenosis or neural foraminal narrowing. L4-L5: Right eccentric disc bulge. This likely contacts the exiting right L4 nerve. Mild right greater than left facet arthropathy. No spinal canal stenosis. Mild right neural foraminal narrowing. L5-S1: No disc protrusion. No spinal canal stenosis or neural foraminal narrowing. IMPRESSION: Right eccentric disc bulge at L4-L5, which causes mild right neural foraminal narrowing and likely contacts the exiting right L4 nerve. Electronically Signed   By: Merilyn Baba M.D.   On: 03/13/2021 14:56    Procedures Procedures   Medications Ordered in ED Medications  ketorolac (TORADOL) 15 MG/ML injection 15 mg (15 mg Intravenous Given 03/13/21 1511)  ondansetron (ZOFRAN) injection 4 mg (4 mg Intravenous Given 03/13/21 1601)  fentaNYL (SUBLIMAZE) injection 50 mcg (50 mcg Intravenous Given 03/13/21 1620)    ED Course  I have reviewed the triage vital signs and the nursing notes.  Pertinent labs & imaging results that were available during my care of the patient were reviewed by me and considered in my medical decision making (see chart for details).  Clinical Course as of 03/13/21 1656  Fri Mar 13, 2021  1550 Reassessed, still having pain, will give dose of fentanyl and reassess [RD]  1550 Updated husband, will touch base with  neurosurgery to coordinate follow-up [RD]  1601 Discussed with Dr. Trenton Gammon, request patient contact office to request appointment in 1 week; rec oral steroids for now [RD]    Clinical Course User Index [RD] Lucrezia Starch, MD   MDM Rules/Calculators/A&P                           55 year old lady presents to ER with concern for right low back pain radiating down right leg.  MRI concerning for mild disc bulge at L4-L5.  Reviewed with neurosurgery, likely culprit for pain.  He recommends course of steroids and follow-up in their clinic.  While awaiting reassessment after additional pain medicine, signed out to Dr. Tinnie Gens.  Anticipate discharge.    Final Clinical Impression(s) / ED Diagnoses Final diagnoses:  L4-L5 disc bulge    Rx / DC Orders ED Discharge Orders     None        Lucrezia Starch, MD 03/13/21 1657

## 2021-03-13 NOTE — ED Notes (Signed)
Pt requesting IV steroids instead of oral steroids. MD Kommer made aware.

## 2021-03-13 NOTE — ED Notes (Signed)
Pt requests fentanyl be given 20 minutes after zofran administration.

## 2021-03-13 NOTE — ED Notes (Signed)
Pt has pain in right side, flank, lumbar with pain all the way down right leg which has been increasing since manipulation by PT on Wednesday.  Pt states that it hurts the most when sitting.

## 2021-03-19 ENCOUNTER — Telehealth: Payer: Self-pay | Admitting: Pharmacist

## 2021-03-19 NOTE — Telephone Encounter (Signed)
Called pt who reports tolerating Vascepa well. Discussed adding Repatha, she wishes to wait a bit until her pain resolves (dealing with bulging disk). She asks that I give her a call back after Labor Day to discuss again.

## 2021-04-08 MED ORDER — REPATHA SURECLICK 140 MG/ML ~~LOC~~ SOAJ
1.0000 | SUBCUTANEOUS | 11 refills | Status: DC
Start: 2021-04-08 — End: 2022-11-24

## 2021-04-08 NOTE — Addendum Note (Signed)
Addended by: Anani Gu E on: 04/08/2021 03:57 PM   Modules accepted: Orders

## 2021-04-08 NOTE — Telephone Encounter (Addendum)
Called pt to follow up, she is willing to start [place] now. Rx has been sent in. She also reports some gas and burping with Vascepa. Advised her to take it with food and that she can refrigerate or freeze the capsules. Also states she ate a lot of seafood recently and her HR increased after and she noted some itching. Now has had a bit of a rash on her face. She generally eats seafood a lot and is wondering if the combination of that and Vascepa is too much. States she would like to know what the derivative of Vascepa comes from - advised her it comes from wild fish, primarily anchovies. She verbalized understanding and will call with any future concerns.

## 2021-04-10 NOTE — Telephone Encounter (Signed)
Created in error

## 2021-04-14 ENCOUNTER — Other Ambulatory Visit: Payer: Self-pay

## 2021-04-14 ENCOUNTER — Emergency Department (HOSPITAL_BASED_OUTPATIENT_CLINIC_OR_DEPARTMENT_OTHER): Payer: PRIVATE HEALTH INSURANCE

## 2021-04-14 ENCOUNTER — Encounter (HOSPITAL_BASED_OUTPATIENT_CLINIC_OR_DEPARTMENT_OTHER): Payer: Self-pay | Admitting: Emergency Medicine

## 2021-04-14 ENCOUNTER — Emergency Department (HOSPITAL_BASED_OUTPATIENT_CLINIC_OR_DEPARTMENT_OTHER)
Admission: EM | Admit: 2021-04-14 | Discharge: 2021-04-15 | Disposition: A | Discharge status: Home / Self Care | Payer: PRIVATE HEALTH INSURANCE | Attending: Emergency Medicine | Admitting: Emergency Medicine

## 2021-04-14 ENCOUNTER — Emergency Department (HOSPITAL_BASED_OUTPATIENT_CLINIC_OR_DEPARTMENT_OTHER): Payer: PRIVATE HEALTH INSURANCE | Admitting: Radiology

## 2021-04-14 DIAGNOSIS — Z79899 Other long term (current) drug therapy: Secondary | ICD-10-CM | POA: Diagnosis not present

## 2021-04-14 DIAGNOSIS — R072 Precordial pain: Secondary | ICD-10-CM | POA: Diagnosis present

## 2021-04-14 DIAGNOSIS — I10 Essential (primary) hypertension: Secondary | ICD-10-CM | POA: Diagnosis not present

## 2021-04-14 DIAGNOSIS — U071 COVID-19: Secondary | ICD-10-CM | POA: Diagnosis not present

## 2021-04-14 DIAGNOSIS — R0602 Shortness of breath: Secondary | ICD-10-CM | POA: Insufficient documentation

## 2021-04-14 DIAGNOSIS — R0789 Other chest pain: Secondary | ICD-10-CM

## 2021-04-14 DIAGNOSIS — M2550 Pain in unspecified joint: Secondary | ICD-10-CM

## 2021-04-14 DIAGNOSIS — R791 Abnormal coagulation profile: Secondary | ICD-10-CM | POA: Insufficient documentation

## 2021-04-14 DIAGNOSIS — J45909 Unspecified asthma, uncomplicated: Secondary | ICD-10-CM | POA: Diagnosis not present

## 2021-04-14 DIAGNOSIS — Z7982 Long term (current) use of aspirin: Secondary | ICD-10-CM | POA: Diagnosis not present

## 2021-04-14 DIAGNOSIS — R7989 Other specified abnormal findings of blood chemistry: Secondary | ICD-10-CM

## 2021-04-14 LAB — CBC WITH DIFFERENTIAL/PLATELET
Abs Immature Granulocytes: 0.02 10*3/uL (ref 0.00–0.07)
Basophils Absolute: 0 10*3/uL (ref 0.0–0.1)
Basophils Relative: 1 %
Eosinophils Absolute: 0.1 10*3/uL (ref 0.0–0.5)
Eosinophils Relative: 1 %
HCT: 44 % (ref 36.0–46.0)
Hemoglobin: 14.7 g/dL (ref 12.0–15.0)
Immature Granulocytes: 0 %
Lymphocytes Relative: 42 %
Lymphs Abs: 2.7 10*3/uL (ref 0.7–4.0)
MCH: 30.9 pg (ref 26.0–34.0)
MCHC: 33.4 g/dL (ref 30.0–36.0)
MCV: 92.6 fL (ref 80.0–100.0)
Monocytes Absolute: 0.4 10*3/uL (ref 0.1–1.0)
Monocytes Relative: 7 %
Neutro Abs: 3.2 10*3/uL (ref 1.7–7.7)
Neutrophils Relative %: 49 %
Platelets: 271 10*3/uL (ref 150–400)
RBC: 4.75 MIL/uL (ref 3.87–5.11)
RDW: 12.1 % (ref 11.5–15.5)
WBC: 6.5 10*3/uL (ref 4.0–10.5)
nRBC: 0 % (ref 0.0–0.2)

## 2021-04-14 LAB — COMPREHENSIVE METABOLIC PANEL
ALT: 17 U/L (ref 0–44)
AST: 19 U/L (ref 15–41)
Albumin: 4.2 g/dL (ref 3.5–5.0)
Alkaline Phosphatase: 57 U/L (ref 38–126)
Anion gap: 12 (ref 5–15)
BUN: 12 mg/dL (ref 6–20)
CO2: 23 mmol/L (ref 22–32)
Calcium: 9.2 mg/dL (ref 8.9–10.3)
Chloride: 106 mmol/L (ref 98–111)
Creatinine, Ser: 0.7 mg/dL (ref 0.44–1.00)
GFR, Estimated: >60 mL/min (ref >60)
Glucose, Bld: 86 mg/dL (ref 70–99)
Potassium: 4.3 mmol/L (ref 3.5–5.1)
Sodium: 141 mmol/L (ref 135–145)
Total Bilirubin: 0.4 mg/dL (ref 0.3–1.2)
Total Protein: 7 g/dL (ref 6.5–8.1)

## 2021-04-14 LAB — TROPONIN I (HIGH SENSITIVITY)
Troponin I (High Sensitivity): <2 ng/L (ref <18)
Troponin I (High Sensitivity): <2 ng/L (ref <18)

## 2021-04-14 LAB — D-DIMER, QUANTITATIVE: D-Dimer, Quant: 0.66 ug/mL-FEU — ABNORMAL HIGH (ref 0.00–0.50)

## 2021-04-14 MED ORDER — ALBUTEROL SULFATE HFA 108 (90 BASE) MCG/ACT IN AERS
1.0000 | INHALATION_SPRAY | Freq: Once | RESPIRATORY_TRACT | Status: AC
  Administered 2021-04-14: 2 via RESPIRATORY_TRACT
  Filled 2021-04-14: qty 6.7

## 2021-04-14 MED ORDER — SODIUM CHLORIDE 0.9 % IV BOLUS
1000.0000 mL | Freq: Once | INTRAVENOUS | Status: AC
  Administered 2021-04-14: 1000 mL via INTRAVENOUS

## 2021-04-14 MED ORDER — KETOROLAC TROMETHAMINE 15 MG/ML IJ SOLN
15.0000 mg | Freq: Once | INTRAMUSCULAR | Status: AC
  Administered 2021-04-14: 15 mg via INTRAVENOUS
  Filled 2021-04-14: qty 1

## 2021-04-14 NOTE — ED Provider Notes (Signed)
  Provider Note MRN:  [id number]  Arrival date & time: 04/15/21    ED Course and Medical Decision Making  Assumed care from Dr. Pearline Cables at shift change.  COVID, chest pain, elevated D-dimer, low concern for PE, awaiting Doppler studies and anticipating discharge thereafter.  Procedures  Final Clinical Impressions(s) / ED Diagnoses     ICD-10-CM   1. COVID-19  U07.1     2. Elevated d-dimer  R79.89 US Venous Img Lower Bilateral (DVT)    US Venous Img Lower Bilateral (DVT)    CANCELED: US Venous Img Upper Bilat (DVT)    CANCELED: US Venous Img Upper Bilat (DVT)    3. Arthralgia, unspecified joint  M25.50     4. Atypical chest pain  R07.89       ED Discharge Orders     None         Discharge Instructions      You were evaluated in the Emergency Department and after careful evaluation, we did not find any emergent condition requiring admission or further testing in the hospital.  Your exam/testing today is overall reassuring.  Ultrasound was negative for DVT.  Please return to the Emergency Department if you experience any worsening of your condition.   Thank you for allowing [place] to be a part of your care.      Barth Kirks. Sedonia Small, [email or web address] [email or web address]    Maudie Flakes, MD 04/15/21 Berniece Salines

## 2021-04-14 NOTE — Discharge Instructions (Addendum)
You were evaluated in the Emergency Department and after careful evaluation, we did not find any emergent condition requiring admission or further testing in the hospital.  Your exam/testing today is overall reassuring.  Ultrasound was negative for DVT.  Please return to the Emergency Department if you experience any worsening of your condition.   Thank you for allowing [place] to be a part of your care.

## 2021-04-14 NOTE — ED Provider Notes (Signed)
[facility] DEPT Provider Note   CSN: [id number] Arrival date & time: 04/14/21  1628     History Chief Complaint  Patient presents with   Chest Pain    [name] [name] is a 55 y.o. female.  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  Patient was diagnosed with COVID-19 with symptom onset approximately 7 days ago.  Is been taking antiviral medications over the past 4 days.  She reports chest discomfort, tightness, squeezing, aching, pressure sensation over the past 24-48 hrs.  Also with pain to her left jaw.  Intermittent arthralgias.  Mild dyspnea over the past week.  No home oxygen use.  No COPD.  She does have history of asthma.  No home inhaler use.  Patient with intermittent diarrhea.  No black stool or blood per rectum.  No nausea.  No abdominal pain.  Reduced oral intake over the past few days secondary to malaise.  The history is provided by the patient. No language interpreter was used.  Chest Pain Pain location:  Substernal area Pain quality: aching, pressure and tightness   Pain radiates to:  L jaw Onset quality:  Gradual Duration:  2 days Timing:  Intermittent Associated symptoms: fatigue and shortness of breath   Associated symptoms: no abdominal pain, no cough, no dysphagia, no fever, no headache, no nausea, no palpitations and no vomiting       Past Medical History:  Diagnosis Date   Anemia    many years ago   Anxiety    situational   Arthritis    "hands" (07/12/2012)   Asthma    seasonal    Chest pain    Chest pain at rest, on going 07/12/2012   Dercum's disease    Eczema    Family history of early CAD 07/12/2012   Fibromyalgia    GERD (gastroesophageal reflux disease)    Headache(784.0)    "often; not daily" (07/12/2012)   Hypertension    not on medications   Kidney stones    Migraine    Pneumonia    as a child   PONV (postoperative nausea and vomiting)     she states she gets very sick    Patient Active Problem List   Diagnosis Date Noted   OSA (obstructive sleep apnea) 12/05/2019   Lymphedema in adult patient 12/05/2019   Bacterial vaginosis 09/19/2019   Candidal vulvovaginitis 09/19/2019   Dysuria 09/19/2019   Urine finding 09/19/2019   Vitamin D deficiency 09/19/2019   Other allergic rhinitis 05/21/2019   Dercum disease 05/21/2019   Adverse food reaction 05/21/2019   Coughing 05/21/2019   Allergic reaction 05/17/2019   Hymenoptera allergy 05/17/2019   Multiple drug allergies 05/17/2019   Upper airway cough syndrome 02/06/2019   DOE (dyspnea on exertion) 02/06/2019   Morbid obesity ([place]) 11/28/2018   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   Tinnitus aurium, unspecified laterality 11/28/2018   New daily persistent headache 11/28/2018   OSA on CPAP 11/28/2018   Acquired trigger finger of left index finger 08/29/2017   Pain in right hand 08/29/2017   Trigger finger of right hand 08/29/2017   Lipoedema 03/14/2017   Blurring of vision 03/14/2017   Class 2 obesity with body mass index (BMI) of 38.0 to 38.9 in adult 03/14/2017   Fibromyalgia 11/09/2016   History of migraine 11/09/2016   History of environmental allergies 11/09/2016   Abdominal apron 10/21/2016   Myalgia 05/13/2016  Adjustment insomnia 05/13/2016   Snoring 05/13/2016   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   Hyperlipidemia 11/03/2014   Pelvic pain in female 07/30/2014   Chest pain at rest, on going, probable GI source 07/12/2012   HTN (hypertension) 07/12/2012   Family history of abdominal aortic aneurysm (AAA) 07/12/2012   Anxiety 07/12/2012    Past Surgical History:  Procedure Laterality Date   breast lift     CARDIAC CATHETERIZATION  07/11/2012   CHOLECYSTECTOMY N/A 09/18/2015   Procedure: LAPAROSCOPIC CHOLECYSTECTOMY;  Surgeon: Coralie Keens, MD;  Location: [facility];  Service: General;  Laterality: N/A;   FOOT SURGERY  09/2019   toe surgery    LEFT HEART CATHETERIZATION WITH CORONARY ANGIOGRAM N/A 07/11/2012   Procedure: LEFT HEART CATHETERIZATION WITH CORONARY ANGIOGRAM;  Surgeon: Lorretta Harp, MD;  Location[facility] C[facility] LAB;  Service: Cardiovascular;  Laterality: N/A;   REDUCTION MAMMAPLASTY Bilateral 10+ years ago   TONSILLECTOMY  ~ 1976   tubes and ovaries removed  2015   VAGINAL HYSTERECTOMY  ~ 2009     OB History   No obstetric history on file.     Family History  Problem Relation Age of Onset   Hiatal hernia Mother    Hypertension Mother    Depression Mother    Allergic rhinitis Mother    Heart disease Father    Stroke Maternal Grandfather    Chronic Renal Failure Paternal Grandmother    Drug abuse Son    Allergic rhinitis Brother    Allergic rhinitis Maternal Aunt     Social History   Tobacco Use   Smoking status: Never   Smokeless tobacco: Never  Vaping Use   Vaping Use: Never used  Substance Use Topics   Alcohol use: No   Drug use: No    Home Medications Prior to Admission medications   Medication Sig Start Date End Date Taking? Authorizing Provider  aspirin EC 81 MG tablet Take 1 tablet (81 mg total) by mouth daily. 05/01/19   Belva Crome, MD  clobetasol (TEMOVATE) 0.05 % external solution clobetasol 0.05 % scalp solution  APPLY TO THE SCALP DAILY AS NEEDED FOR [facility]    [provider]  EPINEPHrine (EPIPEN 2-PAK) 0.3 mg/0.3 mL IJ SOAJ injection Inject 0.3 mLs (0.3 mg total) into the muscle as needed. 10/03/19   Garnet Sierras, DO  estradiol (ESTRACE) 1 MG tablet  04/11/19   [provider]  Evolocumab (REPATHA SURECLICK) XX123456 MG/ML SOAJ Inject 1 pen into the skin every 14 (fourteen) days. 04/08/21   Belva Crome, MD  ezetimibe (ZETIA) 10 MG tablet Take 1 tablet (10 mg total) by mouth daily. 07/07/20 07/02/21  Belva Crome, MD  furosemide (LASIX) 20 MG tablet Take 1 tablet (20 mg total) by mouth daily as needed for fluid or edema. 11/17/20   Tommie Raymond, NP  icosapent Ethyl  (VASCEPA) 1 g capsule Take 2 capsules (2 g total) by mouth 2 (two) times daily. 02/18/21   Belva Crome, MD  ipratropium (ATROVENT) 0.03 % nasal spray Place 1-2 sprays into both nostrils 2 (two) times daily as needed for rhinitis. 01/05/21   Garnet Sierras, DO  ketoconazole (NIZORAL) 2 % shampoo  04/11/19   [provider]  levocetirizine (XYZAL) 5 MG tablet Take 1 tablet (5 mg total) by mouth every evening. 01/05/21   Garnet Sierras, DO  levothyroxine (SYNTHROID) 50 MCG tablet Take 50 mcg by mouth daily. 12/10/20   [provider]  metroNIDAZOLE (METROGEL) 0.75 % gel metronidazole 0.75 % topical gel  APPLY ON THE SKIN DAILY FOR ROSACEA    [provider]  Multiple Vitamins-Calcium (ONE-A-DAY WOMENS PO) Take 1 tablet by mouth daily.    [provider]  olmesartan (BENICAR) 20 MG tablet Take 20 mg by mouth daily. 11/03/18   [provider]  Olopatadine HCl 0.2 % SOLN Apply 1 drop to eye daily as needed (itchy/watery eyes). 01/05/21   Garnet Sierras, DO  ondansetron (ZOFRAN ODT) 4 MG disintegrating tablet Take 1 tablet (4 mg total) by mouth every 8 (eight) hours as needed for nausea or vomiting. 08/19/17   Francine Graven, DO  ondansetron (ZOFRAN ODT) 4 MG disintegrating tablet Take 1 tablet (4 mg total) by mouth every 8 (eight) hours as needed for nausea or vomiting. 03/13/21   Lucrezia Starch, MD  predniSONE (STERAPRED UNI-PAK 21 TAB) 10 MG (21) TBPK tablet Take by mouth daily. Take 6 tabs by mouth daily  for 1 day, then 5 tabs for 1 day, then 4 tabs for 1 day, then 3 tabs for 1 day, 2 tabs for 1 day, then 1 tab by mouth daily for 1 day 03/13/21   Lucrezia Starch, MD  RABEprazole (ACIPHEX) 20 MG tablet Take 20 mg by mouth 2 (two) times daily.    [provider]  rizatriptan (MAXALT-MLT) 10 MG disintegrating tablet Take 10 mg by mouth every 2 (two) hours as needed for migraine.  06/18/15   [provider]  rosuvastatin (CRESTOR) 5 MG tablet Take 1 tablet  (5 mg total) by mouth daily. 07/07/20 07/07/21  Belva Crome, MD  Vitamin D, Ergocalciferol, (DRISDOL) 50000 units CAPS capsule Take 50,000 Units by mouth every Wednesday.     [provider]    Allergies    Betadine [povidone iodine], Contrast media [iodinated diagnostic agents], Morphine and related, Prednisone, Yellow jacket venom [bee venom], Azithromycin, Erythromycin, Iohexol, Oxycontin [oxycodone], Avelox [moxifloxacin hcl in nacl], Codeine, Gadolinium derivatives, Iodine, Lamisil [terbinafine], Levaquin [levofloxacin in d5w], Oxycodone-acetaminophen, Pantoprazole, Praluent [alirocumab], Pravastatin, Rosuvastatin, Shellfish allergy, Augmentin [amoxicillin-pot clavulanate], Dilaudid [hydromorphone hcl], Oxycodone hcl, and Tramadol  Review of Systems   Review of Systems  Constitutional:  Positive for appetite change and fatigue. Negative for chills and fever.  HENT:  Negative for facial swelling and trouble swallowing.   Eyes:  Negative for photophobia and visual disturbance.  Respiratory:  Positive for shortness of breath. Negative for cough.   Cardiovascular:  Positive for chest pain. Negative for palpitations.  Gastrointestinal:  Positive for diarrhea. Negative for abdominal pain, nausea and vomiting.  Endocrine: Negative for polydipsia and polyuria.  Genitourinary:  Negative for difficulty urinating and hematuria.  Musculoskeletal:  Positive for arthralgias. Negative for gait problem and joint swelling.  Skin:  Negative for pallor and rash.  Neurological:  Negative for syncope and headaches.  Psychiatric/Behavioral:  Negative for agitation and confusion.    Physical Exam Updated Vital Signs BP 132/65   Pulse 100   Temp 98.6 F (37 C) (Oral)   Resp 11   Ht '5\' 5"'$  (1.651 m)   Wt 86.2 kg   SpO2 99%   BMI 31.62 kg/m   Physical Exam Vitals and nursing note reviewed.  Constitutional:      General: She is not in acute distress.    Appearance: Normal appearance. She is  not ill-appearing.  HENT:     Head: Normocephalic and atraumatic.      Right Ear: External ear normal.  Tympanic membrane is not erythematous.     Left Ear: External ear normal. Tympanic membrane is not erythematous.     Ears:     Comments: TM without erythema b/l. Minimal effusion to right ear, no mastoid tenderness.     Nose: Nose normal.     Mouth/Throat:     Mouth: Mucous membranes are moist.  Eyes:     General: No scleral icterus.       Right eye: No discharge.        Left eye: No discharge.  Cardiovascular:     Rate and Rhythm: Normal rate and regular rhythm.     Pulses: Normal pulses.     Heart sounds: Normal heart sounds.  Pulmonary:     Effort: Pulmonary effort is normal. No respiratory distress.     Breath sounds: Normal breath sounds.  Abdominal:     General: Abdomen is flat.     Palpations: Abdomen is soft.     Tenderness: There is no abdominal tenderness.  Musculoskeletal:        General: Normal range of motion.     Cervical back: Normal range of motion.     Right lower leg: No edema.     Left lower leg: No edema.  Skin:    General: Skin is warm and dry.     Capillary Refill: Capillary refill takes less than 2 seconds.  Neurological:     Mental Status: She is alert.  Psychiatric:        Mood and Affect: Mood normal.        Behavior: Behavior normal.    ED Results / Procedures / Treatments   Labs (all labs ordered are listed, but only abnormal results are displayed) Labs Reviewed  D-DIMER, QUANTITATIVE - Abnormal; Notable for the following components:      Result Value   D-Dimer, Quant 0.66 (*)    All other components within normal limits  CBC WITH DIFFERENTIAL/PLATELET  COMPREHENSIVE METABOLIC PANEL  TROPONIN I (HIGH SENSITIVITY)  TROPONIN I (HIGH SENSITIVITY)    EKG EKG Interpretation  Date/Time:  Tuesday April 14 2021 17:00:16 EDT Ventricular Rate:  102 PR Interval:  162 QRS Duration: 74 QT Interval:  338 QTC Calculation: 440 R  Axis:   -2 Text Interpretation: Sinus tachycardia Otherwise normal ECG Similar to previous tracing Confirmed by Wynona Dove ([id number]) on 04/14/2021 7:29:05 PM  Radiology DG Chest 2 View  Result Date: 04/14/2021 CLINICAL DATA:  Chest pain. EXAM: CHEST - 2 VIEW COMPARISON:  Chest x-ray 11/01/2018. FINDINGS: The heart size and mediastinal contours are within normal limits. Both lungs are clear. The visualized skeletal structures are unremarkable. IMPRESSION: No active cardiopulmonary disease. Electronically Signed   By: Ronney Asters M.D.   On: 04/14/2021 19:44    Procedures Procedures   Medications Ordered in ED Medications  albuterol (VENTOLIN HFA) 108 (90 Base) MCG/ACT inhaler 1-2 puff (has no administration in time range)  ketorolac (TORADOL) 15 MG/ML injection 15 mg (15 mg Intravenous Given 04/14/21 1944)  sodium chloride 0.9 % bolus 1,000 mL (0 mLs Intravenous Stopped 04/14/21 2104)  sodium chloride 0.9 % bolus 1,000 mL (0 mLs Intravenous Stopped 04/14/21 2223)    ED Course  I have reviewed the triage vital signs and the nursing notes.  Pertinent labs & imaging results that were available during my care of the patient were reviewed by me and considered in my medical decision making (see chart for details).    MDM Rules/Calculators/A&P  This patient complains of chest pain, arthralgias; this involves an extensive number of treatment options and is a complaint that carries with it a high risk of complications and Morbidity.  Vital signs reviewed are stable.  No acute respiratory distress.  Serious etiologies considered.   EKG without acute ischemia, troponin is negative.  No ongoing chest pain.  Chest x-ray is nonacute.  ACS is less likely.  Low risk Wells score, recent COVID-19 diagnosis.  Obtain D-dimer.  This is mildly elevated.  Will obtain lower extremity venous duplex to rule out proximal DVT.  Patient symptoms likely secondary to COVID-19 infection.  Her  symptoms have improved greatly following intervention.  At this time patient signed out to incoming EDP pending LE duplex. If this is negative reasonable to discharge patient with close o/p f/u. Discussed this at length with the patient and with patient's spouse. If LE duplex is positive for proximal DVT then patient may require obs for contrast prep for CTPE, recommend utilizing shared decision making process for final disposition.     Final Clinical Impression(s) / ED Diagnoses Final diagnoses:  COVID-19  Arthralgia, unspecified joint  Atypical chest pain    Rx / DC Orders ED Discharge Orders     None        Jeanell Sparrow, DO 04/14/21 2323

## 2021-04-14 NOTE — ED Triage Notes (Signed)
Pt tested positive for covid 5 days ago. Pt c/o of pressure in her chest that radiates to her back as stabbing pain. Pain started yesterday. Pt also complains of jaw pain.

## 2021-04-14 NOTE — ED Notes (Signed)
Patient transported to Ultrasound 

## 2021-04-16 ENCOUNTER — Telehealth: Payer: Self-pay | Admitting: Pharmacist

## 2021-04-16 ENCOUNTER — Encounter: Payer: Self-pay | Admitting: Allergy

## 2021-04-16 NOTE — Telephone Encounter (Signed)
Returned call to pt. She doesn't know the date Repatha was delivered, her husband brought it in and she doesn't know what date that was either, med has been sitting on counter since then. Was packed with ice packs, feels room temp now. Med window is clear, no particles or cloudiness. Advised pt to check with her husband, if he brought it inside within a day of delivery, med should have still been cold and is ok at room temp for up to a month. If he does not know the date or thinks it was outside for a few days, advised her to call pharmacy to see if they could resend rx.

## 2021-04-16 NOTE — Telephone Encounter (Signed)
   Pt c/o medication issue:  1. Name of Medication:   Evolocumab (REPATHA SURECLICK) [id number] MG/ML SOAJ    2. How are you currently taking this medication (dosage and times per day)? Inject 1 pen into the skin every 14 (fourteen) days.  3. Are you having a reaction (difficulty breathing--STAT)? \  4. What is your medication issue? Pt requesting to speak with Jinny Blossom about her repatha, she said the pharmacy deliver her repatha they didn't know that was delivered she has covid and been staying in bed, she said they saw in outside their door after few day it was delivered and wanted to know what she needs to do.

## 2021-04-21 ENCOUNTER — Other Ambulatory Visit: Payer: Self-pay | Admitting: *Deleted

## 2021-04-21 MED ORDER — EPINEPHRINE 0.3 MG/0.3ML IJ SOAJ: 0.3000 mg | INTRAMUSCULAR | 1 refills | Status: DC | PRN

## 2021-04-28 ENCOUNTER — Telehealth: Payer: Self-pay | Admitting: *Deleted

## 2021-04-28 NOTE — Telephone Encounter (Signed)
-----   Message from Garnet Sierras, DO sent at 04/28/2021  2:55 PM EDT ----- Regarding: RE: Venom Injection Please make a note. We will restart at the beginning of our protocol with concentration 0.003 mixed vespid at 0.69mL - use schedule A to build up.  Thank you.  ----- Message ----- From: Derl Barrow, CMA Sent: 04/28/2021  12:00 PM EDT To: Garnet Sierras, DO Subject: Venom Injection                                Patient has been scheduled to come in this Monday October 3rd at 2 for her venom injection.

## 2021-04-28 NOTE — Telephone Encounter (Signed)
Patient's allergy flow sheet has been updated to reflect these changes.  

## 2021-05-04 ENCOUNTER — Ambulatory Visit: Payer: No Typology Code available for payment source

## 2021-05-05 ENCOUNTER — Ambulatory Visit (INDEPENDENT_AMBULATORY_CARE_PROVIDER_SITE_OTHER): Payer: No Typology Code available for payment source | Admitting: *Deleted

## 2021-05-05 ENCOUNTER — Other Ambulatory Visit: Payer: Self-pay

## 2021-05-05 DIAGNOSIS — T63441D Toxic effect of venom of bees, accidental (unintentional), subsequent encounter: Secondary | ICD-10-CM | POA: Diagnosis not present

## 2021-05-12 ENCOUNTER — Ambulatory Visit (INDEPENDENT_AMBULATORY_CARE_PROVIDER_SITE_OTHER): Payer: No Typology Code available for payment source

## 2021-05-12 ENCOUNTER — Other Ambulatory Visit: Payer: Self-pay

## 2021-05-12 DIAGNOSIS — T63441D Toxic effect of venom of bees, accidental (unintentional), subsequent encounter: Secondary | ICD-10-CM | POA: Diagnosis not present

## 2021-05-18 ENCOUNTER — Encounter: Payer: Self-pay | Admitting: *Deleted

## 2021-05-19 ENCOUNTER — Ambulatory Visit: Payer: No Typology Code available for payment source | Admitting: Adult Health

## 2021-05-19 ENCOUNTER — Ambulatory Visit: Payer: No Typology Code available for payment source

## 2021-05-19 ENCOUNTER — Encounter: Payer: Self-pay | Admitting: Adult Health

## 2021-05-19 VITALS — BP 142/88 | HR 71 | Ht 65.0 in

## 2021-05-19 DIAGNOSIS — Z9989 Dependence on other enabling machines and devices: Secondary | ICD-10-CM

## 2021-05-19 DIAGNOSIS — G4733 Obstructive sleep apnea (adult) (pediatric): Secondary | ICD-10-CM | POA: Diagnosis not present

## 2021-05-19 NOTE — Progress Notes (Signed)
PATIENT: Celise Bazar DOB: 1966-03-25  REASON FOR VISIT: follow up HISTORY FROM: patient PRIMARY NEUROLOGIST:   HISTORY OF PRESENT ILLNESS: Today 05/19/21:  Ms. Figeroa is a 55 year old female with a history of obstructive sleep apnea on CPAP.  She returns today for follow-up.  The patient is very upset with her DME company.  She has billing questions also states that she is not getting the correct supplies.  Has a hard time getting anyone on the phone that can help her.  Therefore her download shows that she is not using the machine consistently.  In the last 30 days she used it 15 out of 30 days for compliance of 50%.  She used it greater than 4 hours 6 days for compliance of 20%.  On average she is at 3 hours and 44 minutes.  Her residual AHI is 1.4 on 5 to 13 cm of water with an EPR of 3.  She states that there are also some nights that she falls asleep without putting it on.  She returns today for an evaluation.    REVIEW OF SYSTEMS: Out of a complete 14 system review of symptoms, the patient complains only of the following symptoms, and all other reviewed systems are negative. ESS 5  ALLERGIES: Allergies  Allergen Reactions   Betadine [Povidone Iodine]     Throat swelling, itchy lips, redness/swelling at application site.   Contrast Media [Iodinated Diagnostic Agents] Anaphylaxis   Morphine And Related Anaphylaxis and Nausea And Vomiting   Prednisone Anaphylaxis   Yellow Jacket Venom [Bee Venom] Anaphylaxis   Azithromycin Other (See Comments)    SEVERE STOMACH PAIN    Erythromycin Nausea And Vomiting and Other (See Comments)    SEVERE STOMACH PAIN/abdominal pain    Iohexol Hives, Itching and Swelling    Swelling of upper lip, thick tongue, hives on face and back and itching all over   Oxycontin [Oxycodone] Nausea And Vomiting   Avelox [Moxifloxacin Hcl In Nacl] Swelling   Codeine Nausea And Vomiting and Other (See Comments)    Headaches also   Gadolinium  Derivatives Hives, Itching and Swelling   Iodine    Lamisil [Terbinafine]     Pt stated, "It made me feel like I had the flu"   Levaquin [Levofloxacin In D5w] Swelling   Oxycodone-Acetaminophen Nausea And Vomiting   Pantoprazole Other (See Comments)    "Feels like I have the flu"   Praluent [Alirocumab]     Swelling in leg, pain, dizziness   Pravastatin     myalgias   Rosuvastatin     Myalgias and cramps, sore throat, indigestion   Shellfish Allergy    Augmentin [Amoxicillin-Pot Clavulanate] Nausea And Vomiting   Dilaudid [Hydromorphone Hcl] Nausea And Vomiting   Oxycodone Hcl Nausea And Vomiting   Tramadol Nausea Only    HOME MEDICATIONS: Outpatient Medications Prior to Visit  Medication Sig Dispense Refill   aspirin EC 81 MG tablet Take 1 tablet (81 mg total) by mouth daily. 90 tablet 3   clobetasol (TEMOVATE) 0.05 % external solution clobetasol 0.05 % scalp solution  APPLY TO THE SCALP DAILY AS NEEDED FOR ITCH     EPINEPHrine (EPIPEN 2-PAK) 0.3 mg/0.3 mL IJ SOAJ injection Inject 0.3 mg into the muscle as needed. 1 each 1   estradiol (ESTRACE) 1 MG tablet      ezetimibe (ZETIA) 10 MG tablet Take 1 tablet (10 mg total) by mouth daily. 30 tablet 11   furosemide (LASIX) 20 MG  tablet Take 1 tablet (20 mg total) by mouth daily as needed for fluid or edema. 30 tablet 3   icosapent Ethyl (VASCEPA) 1 g capsule Take 2 capsules (2 g total) by mouth 2 (two) times daily. 360 capsule 3   ipratropium (ATROVENT) 0.03 % nasal spray Place 1-2 sprays into both nostrils 2 (two) times daily as needed for rhinitis. 30 mL 5   ketoconazole (NIZORAL) 2 % shampoo      levocetirizine (XYZAL) 5 MG tablet Take 1 tablet (5 mg total) by mouth every evening. 30 tablet 5   metroNIDAZOLE (METROGEL) 0.75 % gel metronidazole 0.75 % topical gel  APPLY ON THE SKIN DAILY FOR ROSACEA     Multiple Vitamins-Calcium (ONE-A-DAY WOMENS PO) Take 1 tablet by mouth daily.     olmesartan (BENICAR) 20 MG tablet Take 20 mg by  mouth daily.     Olopatadine HCl 0.2 % SOLN Apply 1 drop to eye daily as needed (itchy/watery eyes). 2.5 mL 5   ondansetron (ZOFRAN ODT) 4 MG disintegrating tablet Take 1 tablet (4 mg total) by mouth every 8 (eight) hours as needed for nausea or vomiting. 20 tablet 0   RABEprazole (ACIPHEX) 20 MG tablet Take 20 mg by mouth 2 (two) times daily.     rizatriptan (MAXALT-MLT) 10 MG disintegrating tablet Take 10 mg by mouth every 2 (two) hours as needed for migraine.   11   rosuvastatin (CRESTOR) 5 MG tablet Take 1 tablet (5 mg total) by mouth daily. 90 tablet 3   Vitamin D, Ergocalciferol, (DRISDOL) 50000 units CAPS capsule Take 50,000 Units by mouth every Wednesday.      Evolocumab (REPATHA SURECLICK) 782 MG/ML SOAJ Inject 1 pen into the skin every 14 (fourteen) days. (Patient not taking: Reported on 05/19/2021) 2 mL 11   levothyroxine (SYNTHROID) 50 MCG tablet Take 50 mcg by mouth daily.     ondansetron (ZOFRAN ODT) 4 MG disintegrating tablet Take 1 tablet (4 mg total) by mouth every 8 (eight) hours as needed for nausea or vomiting. 6 tablet 0   No facility-administered medications prior to visit.    PAST MEDICAL HISTORY: Past Medical History:  Diagnosis Date   Anemia    many years ago   Anxiety    situational   Arthritis    "hands" (07/12/2012)   Asthma    seasonal    Chest pain    Chest pain at rest, on going 07/12/2012   Dercum's disease    Eczema    Family history of early CAD 07/12/2012   Fibromyalgia    GERD (gastroesophageal reflux disease)    Headache(784.0)    "often; not daily" (07/12/2012)   Hypertension    not on medications   Kidney stones    Migraine    Pneumonia    as a child   PONV (postoperative nausea and vomiting)    she states she gets very sick    PAST SURGICAL HISTORY: Past Surgical History:  Procedure Laterality Date   breast lift     CARDIAC CATHETERIZATION  07/11/2012   CHOLECYSTECTOMY N/A 09/18/2015   Procedure: LAPAROSCOPIC CHOLECYSTECTOMY;   Surgeon: Coralie Keens, MD;  Location: [facility];  Service: General;  Laterality: N/A;   FOOT SURGERY  09/2019   toe surgery   LEFT HEART CATHETERIZATION WITH CORONARY ANGIOGRAM N/A 07/11/2012   Procedure: LEFT HEART CATHETERIZATION WITH CORONARY ANGIOGRAM;  Surgeon: Lorretta Harp, MD;  Location: [facility] CATH LAB;  Service: Cardiovascular;  Laterality: N/A;   REDUCTION  MAMMAPLASTY Bilateral 10+ years ago   TONSILLECTOMY  ~ 1976   tubes and ovaries removed  2015   VAGINAL HYSTERECTOMY  ~ 2009    FAMILY HISTORY: Family History  Problem Relation Age of Onset   Hiatal hernia Mother    Hypertension Mother    Depression Mother    Allergic rhinitis Mother    Heart disease Father    Stroke Maternal Grandfather    Chronic Renal Failure Paternal Grandmother    Drug abuse Son    Allergic rhinitis Brother    Allergic rhinitis Maternal Aunt     SOCIAL HISTORY: Social History   Socioeconomic History   Marital status: Married    Spouse name: Not on file   Number of children: Not on file   Years of education: Not on file   Highest education level: Not on file  Occupational History   Not on file  Tobacco Use   Smoking status: Never   Smokeless tobacco: Never  Vaping Use   Vaping Use: Never used  Substance and Sexual Activity   Alcohol use: No   Drug use: No   Sexual activity: Yes  Other Topics Concern   Not on file  Social History Narrative   Not on file   Social Determinants of Health   Financial Resource Strain: Not on file  Food Insecurity: Not on file  Transportation Needs: Not on file  Physical Activity: Not on file  Stress: Not on file  Social Connections: Not on file  Intimate Partner Violence: Not on file      PHYSICAL EXAM  Vitals:   05/19/21 0827  BP: (!) 142/88  Pulse: 71  Height: 5\' 5"  (1.651 m)   Body mass index is 31.62 kg/m.  Generalized: Well developed, in no acute distress  Chest: Lungs clear to auscultation bilaterally  Neurological  examination  Mentation: Alert oriented to time, place, history taking. Follows all commands speech and language fluent Cranial nerve II-XII: Extraocular movements were full, visual field were full on confrontational test Head turning and shoulder shrug  were normal and symmetric. Motor: The motor testing reveals 5 over 5 strength of all 4 extremities. Good symmetric motor tone is noted throughout.  Sensory: Sensory testing is intact to soft touch on all 4 extremities. No evidence of extinction is noted.  Gait and station: Gait is normal.    DIAGNOSTIC DATA (LABS, IMAGING, TESTING) - I reviewed patient records, labs, notes, testing and imaging myself where available.  Lab Results  Component Value Date   WBC 6.5 04/14/2021   HGB 14.7 04/14/2021   HCT 44.0 04/14/2021   MCV 92.6 04/14/2021   PLT 271 04/14/2021      Component Value Date/Time   NA 141 04/14/2021 1920   NA 142 04/17/2020 1152   K 4.3 04/14/2021 1920   CL 106 04/14/2021 1920   CO2 23 04/14/2021 1920   GLUCOSE 86 04/14/2021 1920   BUN 12 04/14/2021 1920   BUN 13 04/17/2020 1152   CREATININE 0.70 04/14/2021 1920   CALCIUM 9.2 04/14/2021 1920   PROT 7.0 04/14/2021 1920   PROT 6.5 02/16/2021 1056   ALBUMIN 4.2 04/14/2021 1920   ALBUMIN 4.4 02/16/2021 1056   AST 19 04/14/2021 1920   ALT 17 04/14/2021 1920   ALKPHOS 57 04/14/2021 1920   BILITOT 0.4 04/14/2021 1920   BILITOT 0.3 02/16/2021 1056   GFRNONAA >60 04/14/2021 1920   GFRAA 89 04/17/2020 1152   Lab Results  Component Value Date  CHOL 220 (H) 12/03/2019   HDL 76 12/03/2019   LDLCALC 119 (H) 12/03/2019   TRIG 144 12/03/2019   CHOLHDL 2.9 12/03/2019   Lab Results  Component Value Date   HGBA1C 5.5 12/03/2019   No results found for: VITAMINB12 No results found for: TSH    ASSESSMENT AND PLAN 55 y.o. year old female  has a past medical history of Anemia, Anxiety, Arthritis, Asthma, Chest pain, Chest pain at rest, on going (07/12/2012), Dercum's  disease, Eczema, Family history of early CAD (07/12/2012), Fibromyalgia, GERD (gastroesophageal reflux disease), Headache(784.0), Hypertension, Kidney stones, Migraine, Pneumonia, and PONV (postoperative nausea and vomiting). here with:  OSA on CPAP  - CPAP compliance suboptimal - Good treatment of AHI when using the machine - Encourage patient to use CPAP nightly and > 4 hours each night -Sent a message to the manager of DME company and asked that he reach out to the patient.  If the patient does not find this beneficial we will refer to another DME company - F/U in 1 year or sooner if needed   Ward Givens, MSN, NP-C 05/19/2021, 8:46 AM [facility] [address], [place], [place] [address] [phone]

## 2021-05-19 NOTE — Patient Instructions (Signed)
Continue using CPAP nightly and greater than 4 hours each night °If your symptoms worsen or you develop new symptoms please let us know.  ° °

## 2021-05-22 ENCOUNTER — Ambulatory Visit: Payer: No Typology Code available for payment source

## 2021-05-22 ENCOUNTER — Other Ambulatory Visit: Payer: Self-pay

## 2021-05-22 ENCOUNTER — Ambulatory Visit (INDEPENDENT_AMBULATORY_CARE_PROVIDER_SITE_OTHER): Payer: No Typology Code available for payment source

## 2021-05-22 DIAGNOSIS — T63441D Toxic effect of venom of bees, accidental (unintentional), subsequent encounter: Secondary | ICD-10-CM

## 2021-05-25 ENCOUNTER — Ambulatory Visit: Payer: No Typology Code available for payment source

## 2021-05-26 ENCOUNTER — Ambulatory Visit (INDEPENDENT_AMBULATORY_CARE_PROVIDER_SITE_OTHER): Payer: No Typology Code available for payment source | Admitting: *Deleted

## 2021-05-26 ENCOUNTER — Other Ambulatory Visit: Payer: Self-pay

## 2021-05-26 DIAGNOSIS — T63441D Toxic effect of venom of bees, accidental (unintentional), subsequent encounter: Secondary | ICD-10-CM

## 2021-06-09 ENCOUNTER — Ambulatory Visit: Payer: No Typology Code available for payment source

## 2021-06-11 ENCOUNTER — Other Ambulatory Visit: Payer: Self-pay

## 2021-06-11 ENCOUNTER — Ambulatory Visit (INDEPENDENT_AMBULATORY_CARE_PROVIDER_SITE_OTHER): Payer: No Typology Code available for payment source

## 2021-06-11 DIAGNOSIS — T63441D Toxic effect of venom of bees, accidental (unintentional), subsequent encounter: Secondary | ICD-10-CM

## 2021-06-14 ENCOUNTER — Other Ambulatory Visit: Payer: Self-pay | Admitting: Interventional Cardiology

## 2021-06-15 NOTE — Telephone Encounter (Signed)
Not a cardiac med.  Was originally filled by her allergy specialist.  She needs to get it from them or PCP.

## 2021-06-17 ENCOUNTER — Other Ambulatory Visit: Payer: Self-pay

## 2021-06-17 ENCOUNTER — Ambulatory Visit: Payer: No Typology Code available for payment source

## 2021-06-17 ENCOUNTER — Ambulatory Visit (INDEPENDENT_AMBULATORY_CARE_PROVIDER_SITE_OTHER): Payer: No Typology Code available for payment source

## 2021-06-17 DIAGNOSIS — T63441D Toxic effect of venom of bees, accidental (unintentional), subsequent encounter: Secondary | ICD-10-CM | POA: Diagnosis not present

## 2021-06-29 ENCOUNTER — Ambulatory Visit: Payer: No Typology Code available for payment source

## 2021-07-09 ENCOUNTER — Ambulatory Visit (INDEPENDENT_AMBULATORY_CARE_PROVIDER_SITE_OTHER): Payer: No Typology Code available for payment source

## 2021-07-09 ENCOUNTER — Other Ambulatory Visit: Payer: Self-pay

## 2021-07-09 DIAGNOSIS — T63441D Toxic effect of venom of bees, accidental (unintentional), subsequent encounter: Secondary | ICD-10-CM

## 2021-07-10 ENCOUNTER — Encounter: Payer: Self-pay | Admitting: Adult Health

## 2021-07-13 NOTE — Telephone Encounter (Signed)
Download shows that her apnea is well treated when she is using the machine however her compliance with the CPAP is not great.  She is not using the machine nightly.  Exhaustion could still be a factor if she is not using the CPAP consistently.  Please have Jeneen Rinks call this patient

## 2021-07-13 NOTE — Telephone Encounter (Signed)
[name] [name] w/ [employer] stated she would send message to billing. I also reached out to manager Jeneen Rinks and he said he would give the pt a call. I tried to call the pt back with an update but I could not reach her and her VM is still full.

## 2021-07-13 NOTE — Telephone Encounter (Signed)
Reached out to [facility] staff. Will update when I hear back.

## 2021-07-15 ENCOUNTER — Other Ambulatory Visit: Payer: Self-pay

## 2021-07-15 ENCOUNTER — Ambulatory Visit (INDEPENDENT_AMBULATORY_CARE_PROVIDER_SITE_OTHER): Payer: No Typology Code available for payment source

## 2021-07-15 DIAGNOSIS — T63441D Toxic effect of venom of bees, accidental (unintentional), subsequent encounter: Secondary | ICD-10-CM

## 2021-07-22 ENCOUNTER — Other Ambulatory Visit: Payer: Self-pay

## 2021-07-22 ENCOUNTER — Ambulatory Visit (INDEPENDENT_AMBULATORY_CARE_PROVIDER_SITE_OTHER): Payer: No Typology Code available for payment source

## 2021-07-22 DIAGNOSIS — T63441D Toxic effect of venom of bees, accidental (unintentional), subsequent encounter: Secondary | ICD-10-CM | POA: Diagnosis not present

## 2021-07-29 ENCOUNTER — Ambulatory Visit (INDEPENDENT_AMBULATORY_CARE_PROVIDER_SITE_OTHER): Payer: No Typology Code available for payment source

## 2021-07-29 ENCOUNTER — Other Ambulatory Visit: Payer: Self-pay

## 2021-07-29 DIAGNOSIS — T63441D Toxic effect of venom of bees, accidental (unintentional), subsequent encounter: Secondary | ICD-10-CM | POA: Diagnosis not present

## 2021-08-12 ENCOUNTER — Other Ambulatory Visit: Payer: Self-pay

## 2021-08-12 ENCOUNTER — Ambulatory Visit (INDEPENDENT_AMBULATORY_CARE_PROVIDER_SITE_OTHER): Payer: PRIVATE HEALTH INSURANCE

## 2021-08-12 ENCOUNTER — Telehealth: Payer: Self-pay

## 2021-08-12 ENCOUNTER — Telehealth: Payer: Self-pay | Admitting: Adult Health

## 2021-08-12 DIAGNOSIS — T63441D Toxic effect of venom of bees, accidental (unintentional), subsequent encounter: Secondary | ICD-10-CM

## 2021-08-12 NOTE — Telephone Encounter (Signed)
Pt states she just spoke with Billing who advised her to leave a message for [place], [place] re: the CPAP supplier still owing her $400.00.  Pt is asking for a call back.

## 2021-08-12 NOTE — Telephone Encounter (Signed)
Spoke with Caryl Pina at [employer] and expressed pt's concerns about the charges. She spoke with another staff member and was able to find out more information. Refund was given to insurance because they were double charged and double paid, not the patient. Pt's balance of $143.56 is a true balance because the pt's deductible was not met during the time of the billing and the receipt of supplies.

## 2021-08-12 NOTE — Telephone Encounter (Signed)
Error

## 2021-08-12 NOTE — Telephone Encounter (Addendum)
I am sorry but I don't think there is anything we do about this. I called the pt and LVM asking for call back. Need more info. This is a billing issue with [employer], not [facility]. I called [employer] and was told that while patient was getting her supplies, the deductible was not met. Therefore the patient would be billed for the supplies.   She should reach out to the [facility] billing office to discuss. Sent pt a mychart message in other encounter where pt had previously told us her concerns in which we had advised her to reach [employer] to discuss.

## 2021-08-13 NOTE — Telephone Encounter (Signed)
The patient returned my call and I let her know the update I received about the refund going to insurance and not the patient because it was the insurance that had been charged twice (she was originally given a Tax inspector but due to a noise, they replaced it with a Resmed).  The patient verbalized understanding that there was only one final charge for a machine.  The patient states she still has the incorrect (dreamstation) supplies that were given to her in October 2021 and she wants to know if she was charged for these AND if she was charged for the supplies (resmed) she got as a replacement.  I was able to speak with Caryl Pina again at [facility] and she clarified that the patient was charged for the supplies that she received in October 2021, however she was NOT charged for the power cord replacement and for the other supplies (resmed) that were sent to her as a replacement for the incorrect (dreamstation) supplies.  They cannot accept a return of those supplies because it has been over 30 days.  Patient had also received subsequent resupply orders on September 19, 2020 and Dec 23, 2020.  I spoke with the patient and I let her know this.  The patient understands that her balance of $143.56 is a true balance because the deductible was not met during the time of billing and the receipt of supplies as I have been told by [employer].  The patient stated she was embarrassed, she was appreciative of the clarification, and she said she would throw away the incorrect supplies at this point and would call [employer] to settle the balance.  She will call us back after everything is settled as she is interested in switching DME companies. Caryl Pina at [facility] aware.

## 2021-08-18 ENCOUNTER — Other Ambulatory Visit: Payer: Self-pay

## 2021-08-18 ENCOUNTER — Ambulatory Visit (INDEPENDENT_AMBULATORY_CARE_PROVIDER_SITE_OTHER): Payer: PRIVATE HEALTH INSURANCE

## 2021-08-18 DIAGNOSIS — T63441D Toxic effect of venom of bees, accidental (unintentional), subsequent encounter: Secondary | ICD-10-CM

## 2021-08-25 ENCOUNTER — Other Ambulatory Visit: Payer: Self-pay

## 2021-08-25 ENCOUNTER — Ambulatory Visit (INDEPENDENT_AMBULATORY_CARE_PROVIDER_SITE_OTHER): Payer: PRIVATE HEALTH INSURANCE

## 2021-08-25 DIAGNOSIS — T63441D Toxic effect of venom of bees, accidental (unintentional), subsequent encounter: Secondary | ICD-10-CM

## 2021-09-07 ENCOUNTER — Other Ambulatory Visit: Payer: Self-pay

## 2021-09-07 ENCOUNTER — Ambulatory Visit (INDEPENDENT_AMBULATORY_CARE_PROVIDER_SITE_OTHER): Payer: PRIVATE HEALTH INSURANCE

## 2021-09-07 DIAGNOSIS — T63441D Toxic effect of venom of bees, accidental (unintentional), subsequent encounter: Secondary | ICD-10-CM

## 2021-09-08 ENCOUNTER — Encounter: Payer: Self-pay | Admitting: Cardiology

## 2021-09-08 ENCOUNTER — Telehealth: Payer: Self-pay | Admitting: Interventional Cardiology

## 2021-09-08 NOTE — Telephone Encounter (Signed)
Patient and husband in [place] last week and she got sick there.  She had chest pain for a week and woke Saturday overnight with nausea and vomiting, dizziness, lightheadedness in addition to the chest pain.  Went to ER in [place] and then left after 2 neg troponins.  She said they wanted her to stay.    Today her symptoms include diarrhea, stomach/abdominal tightening and pain and burning and tightness under left breast into throat.   Nothing makes this worse.  She is sleeping during the night.  She has already been scheduled on the DOD schedule for Thursday and said that her husband made the appointment and she knows its not until Thursday because he told our scheduler that it is not urgent.    I adv the patient that it does sound urgent but that is the soonest available spot at this time.  Adv to seek ER attention if any change or worsening in symptoms and to contact her PCP tomorrow as this still may be a viral illness.  Her calcium score was zero in 09/2020. Her trops were negative x2 per [facility] ER note.

## 2021-09-08 NOTE — Telephone Encounter (Signed)
Error

## 2021-09-08 NOTE — Telephone Encounter (Signed)
Pt c/o of Chest Pain: STAT if CP now or developed within 24 hours  1. Are you having CP right now? Yes  2. Are you experiencing any other symptoms (ex. SOB, nausea, vomiting, sweating)? SOB, cannot hear over the phone  3. How long have you been experiencing CP? Started a week ago   4. Is your CP continuous or coming and going? Coming and going  5. Have you taken Nitroglycerin? No  ?   Transferred to triage due to being STAT.

## 2021-09-09 NOTE — Progress Notes (Signed)
Cardiology Office Note:    Date:  09/10/2021   ID:  [name] [name], DOB 1965/10/15, MRN [id number]  PCP:  Ginger Organ., MD   [facility] Providers Cardiologist:  Candee Furbish, MD     Referring MD: Ginger Organ., MD   History of Present Illness:    [name] [name] is a 56 y.o. [name] here for the evaluation of chest pain. Her husband is [name].  [name] [name] called our office 09/08/2021 complaining of active chest pain, intermittent since one week prior. She also noted burning and tighness under left breast radiating to her throat, stomach/abdominal tightening/pain, shortness of breath, and diarrhea. She was advised to seek ER attention if symptoms changed or worsened as there were no earlier appointments available than her scheduled visit for 09/10/21.  09/05/2021 ED notes from [facility] reviewed. She presented at that time following 3 days of intermittent chest pain. She woke up at 2 AM that morning with a pressure/squeezing mid-sternal pain radiating to her left shoulder blade. She also suffered from nausea, multiple episodes of vomiting and 2 episodes of diarrhea that morning. The day prior her pain was mildly worse with eating. 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 She reported a family history of CAD in her father in his 31's. Her husband is an emergency medicine physician in [place] and preferred that she not be admitted to the hospital. Her symptoms were treated with IV fluid, Pepcid, and pain medication. Troponins x2 were negative in the ED, decision making discussion shared regarding discharge and follow-up in [place].  She presented to the ED 04/14/2021 complaining of chest pain and arthralgias. EKG without acute ischemia, troponin was negative. D-dimer was mildly elevated. Her symptoms were thought to be likely secondary to recent COVID-19 infection.  Today: Overall, she is feeling frustrated. She has been suffering multiple  symptoms due to her Dercum's disease. Currently she has pain upon deep inspiration. She continues to feel breathless and dizzy. Additionally, she feels like she is constantly having a sensation like an adrenaline rush, as well as diaphoresis . After eating dinner last Friday, she experienced burning pain in her chest, and she felt like "she couldn't lift her chest to breathe". Her pain radiated deeply and ventrally to between her shoulder blades. She also had multiple emetic episodes.  Typically she is an active person, she enjoys hiking, gardening, and walking. She has not exercised much since June, which concerns her. Activities such as gardening have also been severely limited. She attributes this to previous injuries since June. She had a mechanical fall down the stairs and injured her foot. She also developed a spinal injury and she continues to recover. Lately she has been trying to swim for exercise.  She has always followed a healthy diet.  She denies any palpitations, headaches, syncope, orthopnea, PND, or lower extremity edema.   Past Medical History:  Diagnosis Date   Anemia    many years ago   Anxiety    situational   Arthritis    "hands" (07/12/2012)   Asthma    seasonal    Chest pain    Chest pain at rest, on going 07/12/2012   Dercum's disease    Eczema    Family history of early CAD 07/12/2012   Fibromyalgia    GERD (gastroesophageal reflux disease)    Headache(784.0)    "often; not daily" (07/12/2012)   Hypertension    not on medications   Kidney  stones    Migraine    Pneumonia    as a child   PONV (postoperative nausea and vomiting)    she states she gets very sick    Past Surgical History:  Procedure Laterality Date   breast lift     CARDIAC CATHETERIZATION  07/11/2012   CHOLECYSTECTOMY N/A 09/18/2015   Procedure: LAPAROSCOPIC CHOLECYSTECTOMY;  Surgeon: Coralie Keens, MD;  Location: [place];  Service: General;  Laterality: N/A;   FOOT SURGERY  09/2019    toe surgery   LEFT HEART CATHETERIZATION WITH CORONARY ANGIOGRAM N/A 07/11/2012   Procedure: LEFT HEART CATHETERIZATION WITH CORONARY ANGIOGRAM;  Surgeon: Lorretta Harp, MD;  Location: [facility] CATH LAB;  Service: Cardiovascular;  Laterality: N/A;   REDUCTION MAMMAPLASTY Bilateral 10+ years ago   TONSILLECTOMY  ~ 1976   tubes and ovaries removed  2015   VAGINAL HYSTERECTOMY  ~ 2009    Current Medications: Current Meds  Medication Sig   aspirin EC 81 MG tablet Take 1 tablet (81 mg total) by mouth daily.   clobetasol (TEMOVATE) 0.05 % external solution clobetasol 0.05 % scalp solution  APPLY TO THE SCALP DAILY AS NEEDED FOR ITCH   EPINEPHrine (EPIPEN 2-PAK) 0.3 mg/0.3 mL IJ SOAJ injection Inject 0.3 mg into the muscle as needed.   estradiol (ESTRACE) 1 MG tablet    Evolocumab (REPATHA SURECLICK) 287 MG/ML SOAJ Inject 1 pen into the skin every 14 (fourteen) days.   ezetimibe (ZETIA) 10 MG tablet Take 1 tablet (10 mg total) by mouth daily.   furosemide (LASIX) 20 MG tablet Take 1 tablet (20 mg total) by mouth daily as needed for fluid or edema.   icosapent Ethyl (VASCEPA) 1 g capsule Take 2 capsules (2 g total) by mouth 2 (two) times daily.   ipratropium (ATROVENT) 0.03 % nasal spray Place 1-2 sprays into both nostrils 2 (two) times daily as needed for rhinitis.   ketoconazole (NIZORAL) 2 % shampoo    levocetirizine (XYZAL) 5 MG tablet Take 1 tablet (5 mg total) by mouth every evening.   metroNIDAZOLE (METROGEL) 0.75 % gel metronidazole 0.75 % topical gel  APPLY ON THE SKIN DAILY FOR ROSACEA   Multiple Vitamins-Calcium (ONE-A-DAY WOMENS PO) Take 1 tablet by mouth daily.   olmesartan (BENICAR) 20 MG tablet Take 20 mg by mouth daily.   Olopatadine HCl 0.2 % SOLN Apply 1 drop to eye daily as needed (itchy/watery eyes).   ondansetron (ZOFRAN ODT) 4 MG disintegrating tablet Take 1 tablet (4 mg total) by mouth every 8 (eight) hours as needed for nausea or vomiting.   RABEprazole (ACIPHEX) 20 MG  tablet Take 20 mg by mouth 2 (two) times daily.   rizatriptan (MAXALT-MLT) 10 MG disintegrating tablet Take 10 mg by mouth every 2 (two) hours as needed for migraine.    rosuvastatin (CRESTOR) 5 MG tablet Take 1 tablet (5 mg total) by mouth daily.   Vitamin D, Ergocalciferol, (DRISDOL) 50000 units CAPS capsule Take 50,000 Units by mouth every Wednesday.      Allergies:   Augmentin [amoxicillin-pot clavulanate], Betadine [povidone iodine], Contrast media [iodinated contrast media], Morphine and related, Prednisone, Yellow jacket venom [bee venom], Azithromycin, Erythromycin, Iohexol, Oxycontin [oxycodone], Avelox [moxifloxacin hcl in nacl], Codeine, Gadolinium derivatives, Iodine, Lamisil [terbinafine], Levaquin [levofloxacin in d5w], Oxycodone-acetaminophen, Pantoprazole, Praluent [alirocumab], Pravastatin, Rosuvastatin, Shellfish allergy, Dilaudid [hydromorphone hcl], Oxycodone hcl, and Tramadol   Social History   Socioeconomic History   Marital status: Married    Spouse name: Not on file  Number of children: Not on file   Years of education: Not on file   Highest education level: Not on file  Occupational History   Not on file  Tobacco Use   Smoking status: Never   Smokeless tobacco: Never  Vaping Use   Vaping Use: Never used  Substance and Sexual Activity   Alcohol use: No   Drug use: No   Sexual activity: Yes  Other Topics Concern   Not on file  Social History Narrative   Not on file   Social Determinants of Health   Financial Resource Strain: Not on file  Food Insecurity: Not on file  Transportation Needs: Not on file  Physical Activity: Not on file  Stress: Not on file  Social Connections: Not on file     Family History: The patient's family history includes Allergic rhinitis in her brother, maternal aunt, and mother; Chronic Renal Failure in her paternal grandmother; Depression in her mother; Drug abuse in her son; Heart disease in her father; Hiatal hernia in her  mother; Hypertension in her mother; Stroke in her maternal grandfather.  ROS:   Please see the history of present illness.    (+) Chest pain (+) Shortness of breath (+) Dizziness (+) Diaphoresis (+) Emesis All other systems reviewed and are negative.  EKGs/Labs/Other Studies Reviewed:    The following studies were reviewed today:  Bilateral LE Venous Doppler 04/14/2021: FINDINGS: RIGHT LOWER EXTREMITY   Common Femoral Vein: No evidence of thrombus. Normal compressibility, respiratory phasicity and response to augmentation.   Saphenofemoral Junction: No evidence of thrombus. Normal compressibility and flow on color Doppler imaging.   Profunda Femoral Vein: No evidence of thrombus. Normal compressibility and flow on color Doppler imaging.   Femoral Vein: No evidence of thrombus. Normal compressibility, respiratory phasicity and response to augmentation.   Popliteal Vein: No evidence of thrombus. Normal compressibility, respiratory phasicity and response to augmentation.   Calf Veins: No evidence of thrombus. Normal compressibility and flow on color Doppler imaging.   Superficial Great Saphenous Vein: No evidence of thrombus. Normal compressibility.   Venous Reflux:  None.   Other Findings:  None.   LEFT LOWER EXTREMITY   Common Femoral Vein: No evidence of thrombus. Normal compressibility, respiratory phasicity and response to augmentation.   Saphenofemoral Junction: No evidence of thrombus. Normal compressibility and flow on color Doppler imaging.   Profunda Femoral Vein: No evidence of thrombus. Normal compressibility and flow on color Doppler imaging.   Femoral Vein: No evidence of thrombus. Normal compressibility, respiratory phasicity and response to augmentation.   Popliteal Vein: No evidence of thrombus. Normal compressibility, respiratory phasicity and response to augmentation.   Calf Veins: No evidence of thrombus. Normal compressibility and flow on  color Doppler imaging.   Superficial Great Saphenous Vein: No evidence of thrombus. Normal compressibility.   Venous Reflux:  None.   Other Findings:  None.   IMPRESSION: No evidence of deep venous thrombosis in either lower extremity.  Cardiac CT 05/16/2020: Aorta: Normal size. Ascending aorta 2.9 cm. No calcifications. No dissection.   Aortic Valve:  Trileaflet.  No calcifications.   Coronary Arteries:  Normal coronary origin.  Right dominance.   RCA is a large dominant artery that gives rise to PDA and PLVB. There is no plaque.   Left main is a large artery that gives rise to LAD and LCX arteries.   LAD is a large vessel that has no plaque.   LCX is a non-dominant artery that gives rise  to one large OM1 branch. There is no plaque.   Other findings:   Normal pulmonary vein drainage into the left atrium.   Normal let atrial appendage without a thrombus.   Normal size of the pulmonary artery.   IMPRESSION: 1. Coronary calcium score of 0. This was 0 percentile for age and sex matched control.   2. Normal coronary origin with right dominance.   3. No evidence of CAD.  Lexiscan Myoview 01/25/2019: Nuclear stress EF: 64%. No T wave inversion was noted during stress. There was no ST segment deviation noted during stress. The study is normal. This is a low risk study.   Normal perfusion. LVEF 64% with normal wall motion. This is a low risk study.  Echo 08/01/2018: Study Conclusions   - Left ventricle: The cavity size was normal. Wall thickness was    increased in a pattern of mild LVH. Systolic function was normal.    The estimated ejection fraction was in the range of 55% to 60%.    Wall motion was normal; there were no regional wall motion    abnormalities.   EKG:  EKG is personally reviewed and interpreted. 09/10/2021: EKG was not ordered. 04/14/2021 (ED): Sinus tachycardia. Rate 102 bpm.  Recent Labs: 04/14/2021: ALT 17; BUN 12; Creatinine, Ser 0.70;  Hemoglobin 14.7; Platelets 271; Potassium 4.3; Sodium 141   Recent Lipid Panel    Component Value Date/Time   CHOL 220 (H) 12/03/2019 1435   TRIG 144 12/03/2019 1435   HDL 76 12/03/2019 1435   CHOLHDL 2.9 12/03/2019 1435   LDLCALC 119 (H) 12/03/2019 1435     Risk Assessment/Calculations:          Physical Exam:    VS:  BP 100/70 (BP Location: Left Arm, Patient Position: Sitting, Cuff Size: Large)    Pulse 90    Ht 5\' 5"  (1.651 m)    Wt 227 lb (103 kg)    BMI 37.77 kg/m     Wt Readings from Last 3 Encounters:  09/10/21 227 lb (103 kg)  04/14/21 190 lb (86.2 kg)  12/05/20 182 lb 15.7 oz (83 kg)     GEN: Well nourished, well developed in no acute distress HEENT: Normal NECK: No JVD; No carotid bruits LYMPHATICS: No lymphadenopathy CARDIAC: RRR, no murmurs, rubs, gallops RESPIRATORY:  Clear to auscultation without rales, wheezing or rhonchi  ABDOMEN: Soft, non-tender, non-distended MUSCULOSKELETAL:  No edema; No deformity  SKIN: Warm and dry NEUROLOGIC:  Alert and oriented x 3 PSYCHIATRIC:  Normal affect   ASSESSMENT:    1. Chest pain at rest, on going, probable GI source   2. Mixed hyperlipidemia    PLAN:    In order of problems listed above:  Chest pain at rest, on going, probable GI source ER notes reviewed from [place] on epic.  Troponins were normal x2.  EKG unremarkable with no ischemic changes.  Recent coronary CT scan showed a calcium score of 0 as well as no evidence of soft plaque.  Normal coronary arteries which is excellent in the setting of her father who had early CAD.  Certainly the chest discomfort could be related to musculoskeletal/GI etiology.  She did have some vomiting/retching that went along with it.  There is no evidence of pericarditis on ECG, no fevers.  Continue to treat conservatively.      Hyperlipidemia Appreciate lipid clinic.  Currently on Repatha.  Zetia Vascepa.  Excellent.       Follow-up: PRN.  Medication  Adjustments/Labs and Tests Ordered: Current medicines are reviewed at length with the patient today.  Concerns regarding medicines are outlined above.   No orders of the defined types were placed in this encounter.  No orders of the defined types were placed in this encounter.  Patient Instructions  Medication Instructions:  Your physician recommends that you continue on your current medications as directed. Please refer to the Current Medication list given to you today.  *If you need a refill on your cardiac medications before your next appointment, please call your pharmacy*   Lab Work: None today If you have labs (blood work) drawn today and your tests are completely normal, you will receive your results only by: [place] (if you have MyChart) OR A paper copy in the mail If you have any lab test that is abnormal or we need to change your treatment, we will call you to review the results.   Testing/Procedures: None today   Follow-Up: At [facility], you and your health needs are our priority.  As part of our continuing mission to provide you with exceptional heart care, we have created designated Provider Care Teams.  These Care Teams include your primary Cardiologist (physician) and Advanced Practice Providers (APPs -  Physician Assistants and Nurse Practitioners) who all work together to provide you with the care you need, when you need it.  We recommend signing up for the patient portal called "MyChart".  Sign up information is provided on this After Visit Summary.  MyChart is used to connect with patients for Virtual Visits (Telemedicine).  Patients are able to view lab/test results, encounter notes, upcoming appointments, etc.  Non-urgent messages can be sent to your provider as well.   To learn more about what you can do with MyChart, go to [email or web address].    Follow up: as needed   Provider:   Dr Daneen Schick.     I,Mathew Stumpf,acting as a Education administrator for  [employer], MD.,have documented all relevant documentation on the behalf of Candee Furbish, MD,as directed by  Candee Furbish, MD while in the presence of Candee Furbish, MD.  I, Candee Furbish, MD, have reviewed all documentation for this visit. The documentation on 09/10/21 for the exam, diagnosis, procedures, and orders are all accurate and complete.   Signed, Candee Furbish, MD  09/10/2021 3:25 PM    [facility]

## 2021-09-10 ENCOUNTER — Ambulatory Visit (INDEPENDENT_AMBULATORY_CARE_PROVIDER_SITE_OTHER): Payer: PRIVATE HEALTH INSURANCE | Admitting: Cardiology

## 2021-09-10 ENCOUNTER — Encounter: Payer: Self-pay | Admitting: Cardiology

## 2021-09-10 ENCOUNTER — Other Ambulatory Visit: Payer: Self-pay

## 2021-09-10 DIAGNOSIS — R079 Chest pain, unspecified: Secondary | ICD-10-CM | POA: Diagnosis not present

## 2021-09-10 DIAGNOSIS — E782 Mixed hyperlipidemia: Secondary | ICD-10-CM | POA: Diagnosis not present

## 2021-09-10 NOTE — Assessment & Plan Note (Signed)
Appreciate lipid clinic.  Currently on Repatha.  Zetia Vascepa.  Excellent.

## 2021-09-10 NOTE — Patient Instructions (Addendum)
Medication Instructions:  Your physician recommends that you continue on your current medications as directed. Please refer to the Current Medication list given to you today.  *If you need a refill on your cardiac medications before your next appointment, please call your pharmacy*   Lab Work: None today If you have labs (blood work) drawn today and your tests are completely normal, you will receive your results only by: [place] (if you have MyChart) OR A paper copy in the mail If you have any lab test that is abnormal or we need to change your treatment, we will call you to review the results.   Testing/Procedures: None today   Follow-Up: At [facility], you and your health needs are our priority.  As part of our continuing mission to provide you with exceptional heart care, we have created designated Provider Care Teams.  These Care Teams include your primary Cardiologist (physician) and Advanced Practice Providers (APPs -  Physician Assistants and Nurse Practitioners) who all work together to provide you with the care you need, when you need it.  We recommend signing up for the patient portal called "MyChart".  Sign up information is provided on this After Visit Summary.  MyChart is used to connect with patients for Virtual Visits (Telemedicine).  Patients are able to view lab/test results, encounter notes, upcoming appointments, etc.  Non-urgent messages can be sent to your provider as well.   To learn more about what you can do with MyChart, go to [email or web address].    Follow up: as needed   Provider:   Dr Daneen Schick.

## 2021-09-10 NOTE — Assessment & Plan Note (Signed)
ER notes reviewed from [place] on epic.  Troponins were normal x2.  EKG unremarkable with no ischemic changes.  Recent coronary CT scan showed a calcium score of 0 as well as no evidence of soft plaque.  Normal coronary arteries which is excellent in the setting of her father who had early CAD.  Certainly the chest discomfort could be related to musculoskeletal/GI etiology.  She did have some vomiting/retching that went along with it.  There is no evidence of pericarditis on ECG, no fevers.  Continue to treat conservatively.

## 2021-09-14 ENCOUNTER — Other Ambulatory Visit: Payer: Self-pay

## 2021-09-14 ENCOUNTER — Ambulatory Visit (INDEPENDENT_AMBULATORY_CARE_PROVIDER_SITE_OTHER): Payer: PRIVATE HEALTH INSURANCE

## 2021-09-14 DIAGNOSIS — T63441D Toxic effect of venom of bees, accidental (unintentional), subsequent encounter: Secondary | ICD-10-CM

## 2021-09-21 ENCOUNTER — Other Ambulatory Visit: Payer: Self-pay

## 2021-09-21 ENCOUNTER — Ambulatory Visit (INDEPENDENT_AMBULATORY_CARE_PROVIDER_SITE_OTHER): Payer: PRIVATE HEALTH INSURANCE

## 2021-09-21 ENCOUNTER — Telehealth: Payer: Self-pay

## 2021-09-21 DIAGNOSIS — T63441D Toxic effect of venom of bees, accidental (unintentional), subsequent encounter: Secondary | ICD-10-CM

## 2021-09-21 NOTE — Telephone Encounter (Signed)
Patient came in today to get her MV venom injection. Patient received her injection around 11:23 and returned around 12:22 pm informing the front staff that she may be having an allergic reaction. Patient complained of Itching all over her face and eyes, she also stated that her lips were tingling. Per dr. Simona Huh 10 mg Zyrtec was given and within 10-15 minutes patient stated she was feeling better. Patients vitals were stable the whole time. Patient received 0.1 ml of the 0.3 strength of MV. Please advise on where to start her at.

## 2021-09-22 NOTE — Telephone Encounter (Signed)
Called to follow up and see how patient is doing since hr reaction yesterday 09/21/2021. Mailbox was full.

## 2021-09-22 NOTE — Telephone Encounter (Signed)
Please restart 0.21mcg vial at 0.20ml - (go back one dose)  Thank you.

## 2021-09-23 NOTE — Telephone Encounter (Signed)
Called patient - unable to leave a message, per recording, the mailbox is full.

## 2021-09-24 NOTE — Telephone Encounter (Signed)
Attempted to call patient, there was no answer and the mailbox was full, will need to attempt to call again.

## 2021-09-28 ENCOUNTER — Encounter: Payer: Self-pay | Admitting: *Deleted

## 2021-09-28 NOTE — Telephone Encounter (Signed)
Called and left a message asking for patient to return call to discuss. MyChart message has been sent to patient.

## 2021-09-29 ENCOUNTER — Ambulatory Visit: Payer: PRIVATE HEALTH INSURANCE

## 2021-09-29 NOTE — Telephone Encounter (Signed)
Patient called today and expressed that she was unaware of her next venom injections since her last reaction. Patient stated that she was waiting for a phone call to inquire on the schedule moving forward with her injection. I advised patient that per our documentation nurses have reached our to her and left voicemail's asking her to return our called to discuss the further steps. Patient stated that she was very upset that she missed her venom appointment as she was unclear of the next steps. I scheduled patient for her next injection and relayed the messages that were attempted to discuss with her with failure due to patient not getting phone calls and returning office messages.

## 2021-09-30 ENCOUNTER — Other Ambulatory Visit: Payer: Self-pay

## 2021-09-30 ENCOUNTER — Ambulatory Visit (INDEPENDENT_AMBULATORY_CARE_PROVIDER_SITE_OTHER): Payer: PRIVATE HEALTH INSURANCE

## 2021-09-30 DIAGNOSIS — T63441D Toxic effect of venom of bees, accidental (unintentional), subsequent encounter: Secondary | ICD-10-CM

## 2021-10-01 ENCOUNTER — Ambulatory Visit: Payer: PRIVATE HEALTH INSURANCE

## 2021-10-09 ENCOUNTER — Ambulatory Visit (INDEPENDENT_AMBULATORY_CARE_PROVIDER_SITE_OTHER): Payer: PRIVATE HEALTH INSURANCE

## 2021-10-09 DIAGNOSIS — T63441D Toxic effect of venom of bees, accidental (unintentional), subsequent encounter: Secondary | ICD-10-CM | POA: Diagnosis not present

## 2021-10-13 ENCOUNTER — Other Ambulatory Visit: Payer: Self-pay

## 2021-10-13 ENCOUNTER — Ambulatory Visit (INDEPENDENT_AMBULATORY_CARE_PROVIDER_SITE_OTHER): Payer: PRIVATE HEALTH INSURANCE | Admitting: *Deleted

## 2021-10-13 DIAGNOSIS — T63441D Toxic effect of venom of bees, accidental (unintentional), subsequent encounter: Secondary | ICD-10-CM

## 2021-10-20 ENCOUNTER — Other Ambulatory Visit: Payer: Self-pay

## 2021-10-20 ENCOUNTER — Ambulatory Visit (INDEPENDENT_AMBULATORY_CARE_PROVIDER_SITE_OTHER): Payer: PRIVATE HEALTH INSURANCE

## 2021-10-20 DIAGNOSIS — T63441D Toxic effect of venom of bees, accidental (unintentional), subsequent encounter: Secondary | ICD-10-CM

## 2021-10-27 ENCOUNTER — Other Ambulatory Visit: Payer: Self-pay

## 2021-10-27 ENCOUNTER — Ambulatory Visit (INDEPENDENT_AMBULATORY_CARE_PROVIDER_SITE_OTHER): Payer: PRIVATE HEALTH INSURANCE

## 2021-10-27 DIAGNOSIS — T63441D Toxic effect of venom of bees, accidental (unintentional), subsequent encounter: Secondary | ICD-10-CM | POA: Diagnosis not present

## 2021-10-29 ENCOUNTER — Ambulatory Visit: Payer: PRIVATE HEALTH INSURANCE | Admitting: Allergy

## 2021-10-29 DIAGNOSIS — J309 Allergic rhinitis, unspecified: Secondary | ICD-10-CM

## 2021-10-29 NOTE — Progress Notes (Deleted)
? ?Follow Up Note ? ?RE: [name] MRN: [id number] DOB: [birth year] ?Date of Office Visit: 10/29/2021 ? ?Referring provider: Ginger Organ., MD ?Primary care provider: Ginger Organ., MD ? ?Chief Complaint: No chief complaint on file. ? ?History of Present Illness: ?I had the pleasure of seeing [name] [name] for a follow up visit at the [facility] on 10/29/2021. She is a 56 y.o. 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 Her previous allergy office visit was on 01/05/2021 with Dr. Maudie Mercury via telemedicine. Today is a regular follow up visit. ? ?Hymenoptera allergy ?Past history - Concern regarding hymenoptera allergy as she developed some respiratory symptoms after getting stung by a yellow jacket. Hymenoptera panel was positive to honeybee, white faced hornet, yellow jacket, wasp, yellow hornet and bumblebee. Borderline to fire ant. ?Interim history - tolerating injections better. No stings since the last visit. She is an avid gardener and has not been stung since the last visit. She does take her epinephrine device outside with her at all times.  ?Continue mixed vespid injections (contains yellow jacket, yellow hornet and white faced hornet). ?Make sure you take your allergy medication the day of injection. ?If you do well with the build up then will add on honey bee and wasp together, then add on fire ant.  ?Carry Epipen and use for anaphylactic reactions as needed.  ?  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 Bloodwork - hymenoptera panel was positive to honeybee, white faced hornet, yellow jacket, wasp, yellow hornet and bumblebee and fire ant. Shellfish panel and seafood panel were all  negative. The scallop and oyster were borderline positive. Tryptase, blood count, urticaria index, and alpha gal were all normal. 2,3 Dinor-11Beta-Prostaglandin F2 Alpha, Urine - 2157pg/mg Cr (ref <5205pg/mg Cr) - normal.  ?Interim history - no more reactions.  ?Continue Xyzal 48m at night. ?Keep track of reactions. ?If you have a reaction next time, please get tryptase level drawn within 2-3 hours. This will help in identifying if it's truly an allergic reaction or some other type of adverse reaction that you are having.  ?For mild symptoms you can take over the counter antihistamines such as Benadryl and monitor symptoms closely. If symptoms worsen or if you have severe symptoms including breathing issues, throat closure, significant swelling, whole body hives, severe diarrhea and vomiting, lightheadedness then inject epinephrine and seek immediate medical care afterwards. ?Emergency action plan in place.  ?Continue to avoid scallops and red dye.  ?  ?Adverse food reaction ?Past history - Currently avoiding gluten and spicy foods as they seem to trigger her Dercum's disease symptoms. Shrimp caused perioral pruritus in the past. 2021 bloodwork scallop 0.12; negative to red dye. ?Interim history - no issues with other foods. ?Continue to avoid scallops and red dye.  ?For mild symptoms you can take over the counter antihistamines such as Benadryl and monitor symptoms closely. If symptoms worsen or if you have severe symptoms including breathing issues, throat closure, significant swelling, whole body hives, severe diarrhea and vomiting, lightheadedness then inject epinephrine and seek immediate medical care afterwards. ?  ?Other allergic rhinitis ?Past history - Perennial rhino conjunctivitis symptoms for many years with worsening in the fall. Patient was on AIT for 3 years  but the last year developed large localized reactions so she stopped the injections. She believes they were helping her symptoms. 2020 immunocap  borderline positive to few pollens. Azelastine not effective.  ?Interim history - increased symptoms since gardening outdoors.  ?Use Atrovent (ipratropium) 0.03% 1-2 sprays per nostril twice a day as needed for runny nose/drainage. ?Nasal saline spray (i.e., Simply Saline) or nasal saline lavage (i.e., NeilMed) is recommended as needed and prior to medicated nasal sprays. ?Continue Xyzal 29m daily at night.  ?May use olopatadine eye drops 0.2% once a day as needed for itchy/watery eyes ?Continue environmental control measures to pollen.  ?  ?Multiple drug allergies ?Past history - Patient is sensitive to various medications. Some of the reactions are more adverse drug reactions rather than IgE mediated reactions. ?Continue to avoid medications which gave her issues in the past. See allergy list.  ?  ?Dercum disease ?Past history - [facility] ?Continue follow up with dermatology, neurology, cardiology. [name]  ?  ?Return in about 6 months (around 07/07/2021). ? ?Assessment and Plan: ?[name] a 56y.o. female with: ?No problem-specific Assessment & Plan notes found for this encounter. ? ?No follow-ups on file. ? ?No orders of the defined types were placed in this encounter. ? ?Lab Orders  ?No laboratory test(s) ordered today  ? ? ?Diagnostics: ?Spirometry:  ?Tracings reviewed. Her effort: {Blank single:19197::"Good reproducible efforts.","It was hard to get consistent efforts and there is a question as to whether this reflects a maximal maneuver.","Poor effort, data can not be interpreted."} ?FVC: ***L ?FEV1: ***L, ***% predicted ?FEV1/FVC ratio: ***% ?Interpretation: {Blank single:19197::"Spirometry consistent with mild obstructive disease","Spirometry consistent with moderate obstructive disease","Spirometry consistent with severe obstructive disease","Spirometry consistent with possible restrictive disease","Spirometry  consistent with mixed obstructive and restrictive disease","Spirometry uninterpretable due to technique","Spirometry consistent with normal pattern","No overt abnormalities noted given today's efforts"}.  ?Please see scanned spirometry results for details. ? ?Skin Testing: {Blank single:19197::"Select foods","Environmental allergy panel","Environmental allergy panel and select foods","Food allergy panel","None","Deferred due to recent antihistamines use"}. ?*** ?Results discussed with patient/family. ? ? ?Medication List:  ?Current Outpatient Medications  ?Medication Sig Dispense Refill  ? aspirin EC 81 MG tablet Take 1 tablet (81 mg total) by mouth daily. 90 tablet 3  ? clobetasol (TEMOVATE) 0.05 % external solution clobetasol 0.05 % scalp solution ? APPLY TO THE SCALP DAILY AS NEEDED FOR ITCH    ? EPINEPHrine (EPIPEN 2-PAK) 0.3 mg/0.3 mL IJ SOAJ injection Inject 0.3 mg into the muscle as needed. 1 each 1  ? estradiol (ESTRACE) 1 MG tablet     ? Evolocumab (REPATHA SURECLICK) 1299MG/ML SOAJ Inject 1 pen into the skin every 14 (fourteen) days. 2 mL 11  ? ezetimibe (ZETIA) 10 MG tablet Take 1 tablet (10 mg total) by mouth daily. 30 tablet 11  ? furosemide (LASIX) 20 MG tablet Take 1 tablet (20 mg total) by mouth daily as needed for fluid or edema. 30 tablet 3  ? icosapent Ethyl (VASCEPA) 1 g capsule Take 2 capsules (2 g total) by mouth 2 (two) times daily. 360 capsule 3  ? ipratropium (ATROVENT) 0.03 % nasal spray Place 1-2 sprays into both nostrils 2 (two) times daily as needed for rhinitis. 30 mL 5  ? ketoconazole (NIZORAL) 2 % shampoo     ? levocetirizine (XYZAL) 5 MG tablet Take 1 tablet (5 mg total) by mouth every evening. 30 tablet 5  ? metroNIDAZOLE (METROGEL) 0.75 %  gel metronidazole 0.75 % topical gel ? APPLY ON THE SKIN DAILY FOR ROSACEA    ? Multiple Vitamins-Calcium (ONE-A-DAY WOMENS PO) Take 1 tablet by mouth daily.    ? olmesartan (BENICAR) 20 MG tablet Take 20 mg by mouth daily.    ? Olopatadine HCl  0.2 % SOLN Apply 1 drop to eye daily as needed (itchy/watery eyes). 2.5 mL 5  ? ondansetron (ZOFRAN ODT) 4 MG disintegrating tablet Take 1 tablet (4 mg total) by mouth every 8 (eight) hours as needed for nausea or vo

## 2021-11-02 ENCOUNTER — Encounter: Payer: Self-pay | Admitting: Neurology

## 2021-11-02 ENCOUNTER — Ambulatory Visit: Payer: PRIVATE HEALTH INSURANCE | Admitting: Neurology

## 2021-11-02 VITALS — BP 137/90 | HR 88 | Ht 65.0 in | Wt 210.0 lb

## 2021-11-02 DIAGNOSIS — Z9989 Dependence on other enabling machines and devices: Secondary | ICD-10-CM | POA: Diagnosis not present

## 2021-11-02 DIAGNOSIS — G4733 Obstructive sleep apnea (adult) (pediatric): Secondary | ICD-10-CM | POA: Diagnosis not present

## 2021-11-02 DIAGNOSIS — Z72820 Sleep deprivation: Secondary | ICD-10-CM | POA: Diagnosis not present

## 2021-11-02 DIAGNOSIS — R058 Other specified cough: Secondary | ICD-10-CM

## 2021-11-02 MED ORDER — CYCLOBENZAPRINE HCL 5 MG PO TABS: 5.0000 mg | ORAL_TABLET | Freq: Every day | ORAL | 1 refills | Status: DC

## 2021-11-02 NOTE — Progress Notes (Signed)
?SLEEP MEDICINE CLINIC ? ? ?Provider:  Larey Seat, MD  ?Referring Provider: Ginger Organ., MD ?Primary Care Physician:  Ginger Organ., MD ? ?Chief Complaint  ?Patient presents with  ? Obstructive Sleep Apnea  ?  Rm 11, alone. Here for 6 month CPAP f/u. Pt planning on changing DME, uses [facility]. Pt has heard herself snoring. Pt having a hard time falling asleep w machine and has taken it off in her sleep.   ? ? ?HPI:  [name] [name] is a 56 y.o. female , followed by Janus Molder, NP  ? ? ? ? ?11-02-2021: RV for OSA today, and reports she is looking for a new PCP. She is snoring with her mask on, still day time drowsy. Has had COVID 19 in September 2022 and  reports it took her long time to recover. She is suffering from weight gain, thickening skin- she depressed about this and feels rejected by her family.  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 Has back pain, which limited exercise. No pain medication is used, only flexaril.  ?She likes water aerobics. ?Compliance with CPAP was logged at 50%. Sleeping only 3 hours a night  ?Average use time on days used 3 h 50 m.  ?Residual AHI 1.1/h. 95% pressure is 9 cm water. Uses a dream wear FFM. Under nose and full mouth covering. ? ? ?  ? ?2021, RV with CD.  ?"She spends 10 month with a machine who didn't work" from March 2020 through January- 2021- she refused a Materials engineer.  She is unhappy with [employer].  ?[name] on 12/05/19 at 10:30 AM EDT. ?Daughter lives in [place] and may have just contracted Covid- , and son just got accepted to medical school.  ?Early on [name] has been fully vaccinated for against Covid, still have her family members husband and son, her daughter just received the first check. ?Daily fatigue severity scale was endorsed at 13 points, Epworth sleepiness score at five points, and the patient has reached a compliance over the last month of 63% CPAP use, average use at time  of CPAP is 3 hours and 55 minutes, minimum pressure is five maximum pressure is 13 with a 3 cm EPR level residual AHI is excellent between 1.4 and 2.2 per night of use CPAP 95th percentile is between 8.9 and 9.6 so well within the range of the current settings no central apneas seem to arise, ? ? ? ? ? ?History of Present Illness: phone visit 11-2018. ? 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 Retro-orbital pressure. ? ?The patient reports that her presumed diagnosis of Dupuytren's disease has progressed and has led to hair loss.  She saw a [facility] dermatology resident physician who prescribed finasteride and a topical minoxidil for her she was only asked to return in 6 months if it did not help".  I was under the impression that finasteride is not used in female patients.  At the same time she started finasteride she was also starting on CPAP and she reports that she developed tinnitus which is actually not uncommon as a side effect of finasteride.  14 days ago she stopped finasteride and yet her tinnitus has continued.  She also has nasal septal deviation her right nausea on is often  occluded.  Her nose is stuffy every morning after using CPAP and she often wakes up with a terrible headache.  She has also had recently spikes and hypertension.  But she had not been using allergy medication for a while she is back on Xyzal and Singulair.  She has called Dr. Constance Holster, ENT and she is going to see his office soon.  The benefit of CPAP therapy has been that she is by far less tired and much more energetic and that her Epworth now is endorsed at 5 out of 24 points a significant decrease.  She no longer snores and she gets about 5 hours of sleep each night.  The download shows a 87% compliance with a residual AHI of 2.5/h and a 90  percentile pressure of 7.2 cm.  Humidifier is set to 2 she is using a ramp time of 0 minutes. ? ? ?Seen in a RV for 04-25-2018, I have the pleasure of seeing [name] today who had become my patient originally when she was referred for sleep study.  Her sleep study took place on 15 May 2009 and at the time revealed an AHI of only 1.1 and during REM sleep of 9.2/h.  We repeated a sleep study upon request of her primary care physician 7 years later on 07 June 2016, she again had an AHI of only 1.2 and supposing been no respiratory event related arousals.  This PSG was an attended sleep study.  In supine sleep there was slightly more apnea noted during REM sleep there was an accentuation to 6.8/h.  She did not have oxygen desaturation of clinical significance and very few periodic limb movements they did not lead to arousals. ? ?Now she presents with similar concerns but the added observation by her husband, [name], of snoring that he had never noticed on her before.  She feels also that her throat has been tighter and that it has been difficult to swallow at times.  She continues to have an indurated and puffy appearing skin, and her physical appearance has changed.  [name] had researched her condition and had suspected that she may have been her Crohn's disease.  However specialist in that field have not seen her and she has been to some degree Ms. lead.  She made an appointment for the [facility] where she was told that only the main [place] branch does investigate this condition. ?She had an Discontinued hormone replacement therapy when she found information that it may make lipomas grow faster.  She slept much better while she was on hormone replacement therapy and I would like for her to resume.  To this day we do not have lipoma documented or found in any of our clinical tests but there is clearly the appearance of a thickened leathery skin, puffiness, and a change in her facial  features. She developed a buffalo hump. She is in pain.  ? I also would like to obtain an MRI of the brain with special attention to the pituitary ruling out acromegaly. ? ? ? ? ?Presumed Dercum's disease. She has been seen in our sleep clinic before,and exactly 7 years ago underwent a sleep study that diagnosed no apnea, no PLM and no organic disease.  ?Her last sleep study took place on 05/15/2009 and revealed an AHI of 1.1 even in supine there was no accentuation but in rem sleep her AHI was 9.2. She did not have oxygen desaturations at the time and snoring was noted  as being mild. Heart rate varied in normal range and remained in sinus rhythm. We discussed at the time to change her bedtime to an earlier time of day and to try Flexeril as helpful for muscle relaxation and sleep induction at a low dose nightly. That was helpful for years, but not any longer. Mrs Harl's husband, an ED Physician, has not noted changes in her sleep, but he is de facto a shift Insurance underwriter.  ?She reports insomnia, generalizied pain and a new finding- nodular subcutaneous lesions , and she suspects to have Dercum 's Disease. These painful nodes are manifesting like cellulite on extremities and abdomen, and she feels misunderstood.  ?She is postmenopausal , enrolled into the [facility] in Spring 2016 , lost 40 pounds. Now en plateau. She still gained in girth while losing weight ! One possible explanation is that subcutaneous lipomata can be stimulated by lactic acid, therfore by exercise. She has incontinence problem.  ?She relates her loss of sleep to her pain all over her body. She already underwent an MRI of the brain and the femur to look at the possibility of having developed lipomata for a central nervous system disease that could correlate to these pains. The one place she can easily sleep is in the cinema, and in the houses of friends.  ?07-21-2016 I have  the pleasure of seeing Mrs. Weitzman today following a sleep  study from November 2017, her AHI was 1.2 and there is no apnea going on and she did not have hypoxemia she did not have periodic limb movement arousals, and she had highly efficient sleep was normal sleep

## 2021-11-03 ENCOUNTER — Ambulatory Visit: Payer: PRIVATE HEALTH INSURANCE

## 2021-11-05 ENCOUNTER — Ambulatory Visit: Payer: No Typology Code available for payment source | Admitting: Neurology

## 2021-11-17 ENCOUNTER — Ambulatory Visit (INDEPENDENT_AMBULATORY_CARE_PROVIDER_SITE_OTHER): Payer: PRIVATE HEALTH INSURANCE

## 2021-11-17 ENCOUNTER — Other Ambulatory Visit: Payer: Self-pay | Admitting: Interventional Cardiology

## 2021-11-17 ENCOUNTER — Telehealth: Payer: Self-pay | Admitting: Cardiology

## 2021-11-17 DIAGNOSIS — T63441D Toxic effect of venom of bees, accidental (unintentional), subsequent encounter: Secondary | ICD-10-CM

## 2021-11-17 MED ORDER — EZETIMIBE 10 MG PO TABS: 10.0000 mg | ORAL_TABLET | Freq: Every day | ORAL | 3 refills | Status: DC

## 2021-11-17 MED ORDER — ROSUVASTATIN CALCIUM 5 MG PO TABS: 5.0000 mg | ORAL_TABLET | Freq: Every day | ORAL | 3 refills | Status: DC

## 2021-11-17 NOTE — Telephone Encounter (Signed)
Pt's medications were sent to pt's pharmacy as requested. Confirmation received.  

## 2021-11-17 NOTE — Telephone Encounter (Signed)
?*  STAT* If patient is at the pharmacy, call can be transferred to refill team. ? ? ?1. Which medications need to be refilled? (please list name of each medication and dose if known) ezetimibe (ZETIA) 10 MG tablet (Expired) ? ?rosuvastatin (CRESTOR) 5 MG tablet (Expired) ? ?2. Which pharmacy/location (including street and city if local pharmacy) is medication to be sent to? [place], [place] ? ?3. Do they need a 30 day or 90 day supply? 90 ? ?Patient is out of medication  ? ?

## 2021-11-23 ENCOUNTER — Ambulatory Visit: Payer: PRIVATE HEALTH INSURANCE

## 2021-11-24 NOTE — Progress Notes (Signed)
? ?Follow Up Note ? ?RE: [name] [name] MRN: [id number] DOB: May 05, 1966 ?Date of Office Visit: 11/25/2021 ? ?Referring provider: Ginger Organ., MD ?Primary care provider: Ginger Organ., MD ? ?Chief Complaint: Follow-up and Medication Refill ? ?History of Present Illness: ?I had the pleasure of seeing [name] [name] for a follow up visit at the [facility] on 11/25/2021. She is a 56 y.o. 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 Her previous allergy office visit was on 01/05/2021 with Dr. Maudie Mercury via telemedicine. Today is a regular follow up visit. ? ?Hymenoptera allergy ?Currently tolerating injections with no issues. ?No stings since the last visit. ?Still has Epipen.  ?Last reaction was in February (mixed vespid 0.52mg/ml strength - 0.148m ?Today received mixed vespid 38m438mml strength 0.80m76mth no issues.  ?She missed a few doses due to her personal schedule issues.  ? ?Patient is doing physical therapy now as she hurt her back/leg.  ? ?Other  ?Noted some itching after eating mainly on the face. This goes away within 1 hour and it doesn't matter what she is eating.  ?  ?Adverse food reaction ?No reactions and avoiding scallops and red dye.  ?  ?Allergic rhinitis ?Noticed some itching in her nose a few months ago with some nasal congestion.  ?She saw ENT in the past in the area but would like to go to see a different ENT.  ?Using Atrovent as needed with unknown benefit. ?Has itchy eyes.  ? 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 ?She is not sure what to do regarding this as nobody seems to be able to help her out.  ? ?Assessment and Plan: ?[name] [name] 55 y35. female with: ?Hymenoptera allergy ?Past history - Concern regarding hymenoptera allergy as she developed some respiratory symptoms after getting stung by a yellow jacket.  Hymenoptera panel was positive to honeybee, white faced hornet, yellow jacket, wasp, yellow hornet and bumblebee. Borderline to fire ant. ?Interim history - last reaction was in February. No stings since the last visit.  ?Continue mixed vespid injections (contains yellow jacket, yellow hornet and white faced hornet) - given today.  ?Make sure you take your allergy medication the day of injection. ?If you do well with the build up then will add on honey bee and wasp together, then add on fire ant.  ?Carry Epipen and use for anaphylactic reactions as needed.  ? ?Pruritus ?Facial itching x 1 hour after eating. No specific food triggers noted. ?Take allegra 180mg338mthe morning. ?Take xyzal 5mg i32mhe evening.  ?Monitor symptoms.  ? 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 Bloodwork - hymenoptera panel was positive to honeybee, white faced hornet, yellow jacket, wasp, yellow hornet and bumblebee and fire ant. Shellfish panel and seafood panel were all negative. The scallop and oyster were borderline positive. Tryptase, blood count, urticaria index, and alpha gal were all normal. 2,3 Dinor-11Beta-Prostaglandin F2 Alpha, Urine - 2157pg/mg Cr (ref <5205pg/mg Cr) - normal.  ?Interim history - no reaction requiring epi.  ?Keep track of reactions. ?If you have a reaction next time, please get tryptase level drawn within 2-3 hours. This will help in identifying if it's truly an allergic reaction or some other  type of adverse reaction that you are having.  ?For mild symptoms you can take over the counter antihistamines such as Benadryl and monitor symptoms closely. If symptoms worsen or if you have severe symptoms including breathing issues, throat closure, significant swelling, whole body hives, severe diarrhea and vomiting,  lightheadedness then inject epinephrine and seek immediate medical care afterwards. ?Emergency action plan in place.  ?Continue to avoid scallops and red dye.  ? ?Adverse food reaction ?Past history - Currently avoiding gluten and spicy foods as they seem to trigger her Dercum's disease symptoms. Shrimp caused perioral pruritus in the past. 2021 bloodwork scallop 0.12; negative to red dye. ?Continue to avoid scallops and red dye.  ?For mild symptoms you can take over the counter antihistamines such as Benadryl and monitor symptoms closely. If symptoms worsen or if you have severe symptoms including breathing issues, throat closure, significant swelling, whole body hives, severe diarrhea and vomiting, lightheadedness then inject epinephrine and seek immediate medical care afterwards. ? ?Other allergic rhinitis ?Past history - Perennial rhino conjunctivitis symptoms for many years with worsening in the fall. Patient was on AIT for 3 years but the last year developed large localized reactions so she stopped the injections. She believes they were helping her symptoms. 2020 immunocap borderline positive to few pollens. Azelastine not effective.  ?Interim history - increased rhinitis symptoms.  ?Use Atrovent (ipratropium) 0.03% 1-2 sprays per nostril twice a day as needed for runny nose/drainage. ?Use Nasacort nasal spray 1 spray per nostril 1-2 times a day as needed for nasal congestion.  ?Nasal saline spray (i.e., Simply Saline) or nasal saline lavage (i.e., NeilMed) is recommended as needed and prior to medicated nasal sprays. ?May use olopatadine eye drops 0.2% once a day as needed for itchy/watery eyes ?Continue environmental control measures to pollen.  ?Refer to [facility] for nasal congestion.  ? ?Multiple drug allergies ?Past history - Patient is sensitive to various medications. Some of the reactions are more adverse drug reactions rather than IgE mediated reactions. ?Continue to avoid medications which gave her  issues in the past. See allergy list.  ? 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 ?Continue follow up with dermatology, neurology, cardiology. ? ?Return in about 6 months (around 05/27/2022). ? ?Meds ordered this encounter  ?Medications  ? levocetirizine (XYZAL) 5 MG tablet  ?  Sig: Take 1 tablet (5 mg total) by mouth every evening.  ?  Dispense:  90 tablet  ?  Refill:  3  ? EPINEPHrine (EPIPEN 2-PAK) 0.3 mg/0.3 mL IJ SOAJ injection  ?  Sig: Inject 0.3 mg into the muscle as needed.  ?  Dispense:  1 each  ?  Refill:  1  ? fexofenadine (ALLEGRA ALLERGY) 180 MG tablet  ?  Sig: Take 1 tablet (180 mg total) by mouth daily.  ?  Dispense:  90 tablet  ?  Refill:  3  ? ipratropium (ATROVENT) 0.03 % nasal spray  ?  Sig: Place 1-2 sprays into both nostrils 2 (two) times daily as needed (nasal drainage).  ?  Dispense:  30 mL  ?  Refill:  5  ? ?Lab Orders  ?No laboratory test(s) ordered today  ? ? ?Diagnostics: ?None.  ? ?Medication List:  ?Current Outpatient Medications  ?Medication Sig Dispense Refill  ? aspirin EC 81 MG tablet Take 1 tablet (81 mg total) by mouth daily. 90 tablet 3  ? clobetasol (TEMOVATE) 0.05 % external solution clobetasol 0.05 %  scalp solution ? APPLY TO THE SCALP DAILY AS NEEDED FOR ITCH    ? cyclobenzaprine (FLEXERIL) 5 MG tablet Take 1 tablet (5 mg total) by mouth at bedtime. 30 tablet 1  ? estradiol (ESTRACE) 1 MG tablet     ? Evolocumab (REPATHA SURECLICK) 110 MG/ML SOAJ Inject 1 pen into the skin every 14 (fourteen) days. 2 mL 11  ? ezetimibe (ZETIA) 10 MG tablet Take 1 tablet (10 mg total) by mouth daily. 90 tablet 3  ? fexofenadine (ALLEGRA ALLERGY) 180 MG tablet Take 1 tablet (180 mg total) by mouth daily. 90 tablet 3  ? furosemide (LASIX) 20 MG tablet Take 1 tablet (20 mg total) by mouth daily as needed for fluid or edema. 30 tablet 3  ? icosapent Ethyl (VASCEPA) 1 g capsule Take 2 capsules (2 g total) by mouth 2 (two) times daily.  360 capsule 3  ? ipratropium (ATROVENT) 0.03 % nasal spray Place 1-2 sprays into both nostrils 2 (two) times daily as needed (nasal drainage). 30 mL 5  ? ketoconazole (NIZORAL) 2 % shampoo     ? metroNIDAZOLE (M

## 2021-11-25 ENCOUNTER — Ambulatory Visit (INDEPENDENT_AMBULATORY_CARE_PROVIDER_SITE_OTHER): Payer: PRIVATE HEALTH INSURANCE | Admitting: Allergy

## 2021-11-25 ENCOUNTER — Encounter: Payer: Self-pay | Admitting: Allergy

## 2021-11-25 ENCOUNTER — Telehealth: Payer: Self-pay

## 2021-11-25 ENCOUNTER — Ambulatory Visit: Payer: PRIVATE HEALTH INSURANCE

## 2021-11-25 VITALS — BP 124/74 | HR 75 | Temp 96.8°F | Resp 18

## 2021-11-25 DIAGNOSIS — T63441D Toxic effect of venom of bees, accidental (unintentional), subsequent encounter: Secondary | ICD-10-CM | POA: Diagnosis not present

## 2021-11-25 DIAGNOSIS — T7840XD Allergy, unspecified, subsequent encounter: Secondary | ICD-10-CM

## 2021-11-25 DIAGNOSIS — L299 Pruritus, unspecified: Secondary | ICD-10-CM | POA: Diagnosis not present

## 2021-11-25 DIAGNOSIS — Z889 Allergy status to unspecified drugs, medicaments and biological substances status: Secondary | ICD-10-CM

## 2021-11-25 DIAGNOSIS — E882 Lipomatosis, not elsewhere classified: Secondary | ICD-10-CM

## 2021-11-25 DIAGNOSIS — J3089 Other allergic rhinitis: Secondary | ICD-10-CM | POA: Diagnosis not present

## 2021-11-25 DIAGNOSIS — T781XXD Other adverse food reactions, not elsewhere classified, subsequent encounter: Secondary | ICD-10-CM

## 2021-11-25 DIAGNOSIS — Z91038 Other insect allergy status: Secondary | ICD-10-CM

## 2021-11-25 MED ORDER — EPINEPHRINE 0.3 MG/0.3ML IJ SOAJ: 0.3000 mg | INTRAMUSCULAR | 1 refills | Status: DC | PRN

## 2021-11-25 MED ORDER — IPRATROPIUM BROMIDE 0.03 % NA SOLN: 1.0000 | Freq: Two times a day (BID) | NASAL | 5 refills | Status: DC | PRN

## 2021-11-25 MED ORDER — LEVOCETIRIZINE DIHYDROCHLORIDE 5 MG PO TABS: 5.0000 mg | ORAL_TABLET | Freq: Every evening | ORAL | 3 refills | Status: DC

## 2021-11-25 MED ORDER — FEXOFENADINE HCL 180 MG PO TABS: 180.0000 mg | ORAL_TABLET | Freq: Every day | ORAL | 3 refills | Status: DC

## 2021-11-25 NOTE — Assessment & Plan Note (Signed)
Past history - Concern regarding hymenoptera allergy as she developed some respiratory symptoms after getting stung by a yellow jacket. Hymenoptera panel was positive to honeybee, white faced hornet, yellow jacket, wasp, yellow hornet and bumblebee. Borderline to fire ant. ?Interim history - last reaction was in February. No stings since the last visit.  ?? Continue mixed vespid injections (contains yellow jacket, yellow hornet and white faced hornet) - given today.  ?? Make sure you take your allergy medication the day of injection. ?? If you do well with the build up then will add on honey bee and wasp together, then add on fire ant.  ?? Carry Epipen and use for anaphylactic reactions as needed.  ?

## 2021-11-25 NOTE — Telephone Encounter (Signed)
Per provider please send patient to [facility]NT for nasal congestion, medical history significant for dercum disease. ?

## 2021-11-25 NOTE — Assessment & Plan Note (Signed)
Facial itching x 1 hour after eating. No specific food triggers noted. ?? Take allegra '180mg'$  in the morning. ?? Take xyzal '5mg'$  in the evening.  ?? Monitor symptoms.  ?

## 2021-11-25 NOTE — Assessment & Plan Note (Signed)
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 Bloodwork - hymenoptera panel was positive to honeybee, white faced hornet, yellow jacket, wasp, yellow hornet and bumblebee and fire ant. Shellfish panel and seafood panel were all negative. The scallop and oyster were borderline positive. Tryptase, blood count, urticaria index, and alpha gal were all normal. 2,3 Dinor-11Beta-Prostaglandin F2 Alpha, Urine - 2157pg/mg Cr (ref <5205pg/mg Cr) - normal.  ?Interim history - no reaction requiring epi.  ?? Keep track of reactions. ?? If you have a reaction next time, please get tryptase level drawn within 2-3 hours. This will help in identifying if it's truly an allergic reaction or some other type of adverse reaction that you are having.  ?? For mild symptoms you can take over the counter antihistamines such as Benadryl and monitor symptoms closely. If symptoms worsen or if you have severe symptoms including breathing issues, throat closure, significant swelling, whole body hives, severe diarrhea and vomiting, lightheadedness then inject epinephrine and seek immediate medical care afterwards. ?? Emergency action plan in place.  ?? Continue to avoid scallops and red dye.  ?

## 2021-11-25 NOTE — Assessment & Plan Note (Signed)
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 Shrimp caused perioral pruritus in the past. 2021 bloodwork scallop 0.12; negative to red dye. ?? Continue to avoid scallops and red dye.  ?? For mild symptoms you can take over the counter antihistamines such as Benadryl and monitor symptoms closely. If symptoms worsen or if you have severe symptoms including breathing issues, throat closure, significant swelling, whole body hives, severe diarrhea and vomiting, lightheadedness then inject epinephrine and seek immediate medical care afterwards. ?

## 2021-11-25 NOTE — Assessment & Plan Note (Signed)
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 Patient was on AIT for 3 years but the last year developed large localized reactions so she stopped the injections. She believes they were helping her symptoms. 2020 immunocap borderline positive to few pollens. Azelastine not effective.  ?Interim history - increased rhinitis symptoms.  ?? Use Atrovent (ipratropium) 0.03% 1-2 sprays per nostril twice a day as needed for runny nose/drainage. ?? Use Nasacort nasal spray 1 spray per nostril 1-2 times a day as needed for nasal congestion.  ?? Nasal saline spray (i.e., Simply Saline) or nasal saline lavage (i.e., NeilMed) is recommended as needed and prior to medicated nasal sprays. ?? May use olopatadine eye drops 0.2% once a day as needed for itchy/watery eyes ?? Continue environmental control measures to pollen.  ?? Refer to [facility] for nasal congestion.  ?

## 2021-11-25 NOTE — Assessment & Plan Note (Signed)
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 Continue follow up with dermatology, neurology, cardiology. 

## 2021-11-25 NOTE — Assessment & Plan Note (Signed)
Past history - Patient is sensitive to various medications. Some of the reactions are more adverse drug reactions rather than IgE mediated reactions. Continue to avoid medications which gave her issues in the past. See allergy list.  

## 2021-11-25 NOTE — Patient Instructions (Addendum)
Itching ?Take allegra '180mg'$  in the morning. ?Take xyzal '5mg'$  in the evening.  ?Monitor symptoms.  ? ?Allergic reaction ?Continue to avoid scallops and red dye. ?Keep track of reactions. ?If you have a reaction next time, please get tryptase level drawn within 2-3 hours. This will help in identifying if it's truly an allergic reaction or some other type of adverse reaction that you are having.  ?For mild symptoms you can take over the counter antihistamines such as Benadryl and monitor symptoms closely. If symptoms worsen or if you have severe symptoms including breathing issues, throat closure, significant swelling, whole body hives, severe diarrhea and vomiting, lightheadedness then inject epinephrine and seek immediate medical care afterwards. ?Emergency action plan in place.  ?  ?Hymenoptera allergy ?Continue mixed vespid injections (contains yellow jacket, yellow hornet and white faced hornet). ?Make sure you take your allergy medication the day of injection. ?If you do well with the build up then will add on honey bee and wasp together, then add on fire ant.  ?Carry Epipen and use for anaphylactic reactions as needed.  ?  ?Other allergic rhinitis ?2020 immunocap borderline positive to few pollens.  ?Use Atrovent (ipratropium) 0.03% 1-2 sprays per nostril twice a day as needed for runny nose/drainage. ?Use Nasacort nasal spray 1 spray per nostril 1-2 times a day as needed for nasal congestion.  ?Nasal saline spray (i.e., Simply Saline) or nasal saline lavage (i.e., NeilMed) is recommended as needed and prior to medicated nasal sprays. ?May use olopatadine eye drops 0.2% once a day as needed for itchy/watery eyes ?Continue environmental control measures to pollen.  ?  ?Multiple drug allergies ?Continue to avoid medications on allergy list.  ?   ?Adverse food reaction ?Continue to avoid scallops and red dye.  ?For mild symptoms you can take over the counter antihistamines such as Benadryl and monitor symptoms  closely. If symptoms worsen or if you have severe symptoms including breathing issues, throat closure, significant swelling, whole body hives, severe diarrhea and vomiting, lightheadedness then inject epinephrine and seek immediate medical care afterwards. ?  ?Dercum disease ?Continue follow up with dermatology, neurology, cardiology. ? ?Return in about 6 months or sooner if needed.  ? ?Refer to [facility] for nasal congestion.  ?[phone] ? ?Reducing Pollen Exposure ?Pollen seasons: trees (spring), grass (summer) and ragweed/weeds (fall). ?Keep windows closed in your home and car to lower pollen exposure.  ?Install air conditioning in the bedroom and throughout the house if possible.  ?Avoid going out in dry windy days - especially early morning. ?Pollen counts are highest between 5 - 10 AM and on dry, hot and windy days.  ?Save outside activities for late afternoon or after a heavy rain, when pollen levels are lower.  ?Avoid mowing of grass if you have grass pollen allergy. ?Be aware that pollen can also be transported indoors on people and pets.  ?Dry your clothes in an automatic dryer rather than hanging them outside where they might collect pollen.  ?Rinse hair and eyes before bedtime. ? ?Skin care recommendations ? ?Bath time: ?Always use lukewarm water. AVOID very hot or cold water. ?Keep bathing time to 5-10 minutes. ?Do NOT use bubble bath. ?Use a mild soap and use just enough to wash the dirty areas. ?Do NOT scrub skin vigorously.  ?After bathing, pat dry your skin with a towel. Do NOT rub or scrub the skin. ? ?Moisturizers and prescriptions:  ?ALWAYS apply moisturizers immediately after bathing (within 3 minutes). This helps to lock-in moisture. ?  Use the moisturizer several times a day over the whole body. ?Good summer moisturizers include: Aveeno, CeraVe, Cetaphil. ?Good winter moisturizers include: Aquaphor, Vaseline, Cerave, Cetaphil, Eucerin, Vanicream. ?When using moisturizers along with  medications, the moisturizer should be applied about one hour after applying the medication to prevent diluting effect of the medication or moisturize around where you applied the medications. When not using medications, the moisturizer can be continued twice daily as maintenance. ? ?Laundry and clothing: ?Avoid laundry products with added color or perfumes. ?Use unscented hypo-allergenic laundry products such as Tide free, Cheer free & gentle, and All free and clear.  ?If the skin still seems dry or sensitive, you can try double-rinsing the clothes. ?Avoid tight or scratchy clothing such as wool. ?Do not use fabric softeners or dyer sheets. ?

## 2021-12-02 ENCOUNTER — Ambulatory Visit (INDEPENDENT_AMBULATORY_CARE_PROVIDER_SITE_OTHER): Payer: PRIVATE HEALTH INSURANCE

## 2021-12-02 ENCOUNTER — Ambulatory Visit: Payer: PRIVATE HEALTH INSURANCE

## 2021-12-02 DIAGNOSIS — Z91038 Other insect allergy status: Secondary | ICD-10-CM | POA: Diagnosis not present

## 2021-12-04 ENCOUNTER — Ambulatory Visit: Payer: PRIVATE HEALTH INSURANCE

## 2021-12-09 ENCOUNTER — Ambulatory Visit: Payer: PRIVATE HEALTH INSURANCE

## 2021-12-10 ENCOUNTER — Ambulatory Visit (INDEPENDENT_AMBULATORY_CARE_PROVIDER_SITE_OTHER): Payer: PRIVATE HEALTH INSURANCE

## 2021-12-10 DIAGNOSIS — Z91038 Other insect allergy status: Secondary | ICD-10-CM

## 2021-12-16 ENCOUNTER — Ambulatory Visit (INDEPENDENT_AMBULATORY_CARE_PROVIDER_SITE_OTHER): Payer: PRIVATE HEALTH INSURANCE

## 2021-12-16 DIAGNOSIS — Z91038 Other insect allergy status: Secondary | ICD-10-CM | POA: Diagnosis not present

## 2021-12-23 ENCOUNTER — Ambulatory Visit: Payer: PRIVATE HEALTH INSURANCE

## 2022-02-04 ENCOUNTER — Encounter: Payer: Self-pay | Admitting: Neurology

## 2022-02-04 ENCOUNTER — Other Ambulatory Visit: Payer: Self-pay | Admitting: Neurology

## 2022-02-04 DIAGNOSIS — Z7689 Persons encountering health services in other specified circumstances: Secondary | ICD-10-CM

## 2022-02-04 DIAGNOSIS — G4733 Obstructive sleep apnea (adult) (pediatric): Secondary | ICD-10-CM

## 2022-03-01 ENCOUNTER — Ambulatory Visit (INDEPENDENT_AMBULATORY_CARE_PROVIDER_SITE_OTHER): Payer: PRIVATE HEALTH INSURANCE

## 2022-03-01 DIAGNOSIS — Z91038 Other insect allergy status: Secondary | ICD-10-CM | POA: Diagnosis not present

## 2022-04-01 ENCOUNTER — Ambulatory Visit (INDEPENDENT_AMBULATORY_CARE_PROVIDER_SITE_OTHER): Payer: PRIVATE HEALTH INSURANCE

## 2022-04-01 DIAGNOSIS — Z91038 Other insect allergy status: Secondary | ICD-10-CM | POA: Diagnosis not present

## 2022-04-14 ENCOUNTER — Ambulatory Visit (INDEPENDENT_AMBULATORY_CARE_PROVIDER_SITE_OTHER): Payer: PRIVATE HEALTH INSURANCE | Admitting: *Deleted

## 2022-04-14 DIAGNOSIS — T63441D Toxic effect of venom of bees, accidental (unintentional), subsequent encounter: Secondary | ICD-10-CM

## 2022-04-20 ENCOUNTER — Ambulatory Visit: Payer: PRIVATE HEALTH INSURANCE

## 2022-04-21 ENCOUNTER — Ambulatory Visit (INDEPENDENT_AMBULATORY_CARE_PROVIDER_SITE_OTHER): Payer: PRIVATE HEALTH INSURANCE | Admitting: *Deleted

## 2022-04-21 DIAGNOSIS — T63441D Toxic effect of venom of bees, accidental (unintentional), subsequent encounter: Secondary | ICD-10-CM | POA: Diagnosis not present

## 2022-04-27 ENCOUNTER — Ambulatory Visit (INDEPENDENT_AMBULATORY_CARE_PROVIDER_SITE_OTHER): Payer: PRIVATE HEALTH INSURANCE | Admitting: *Deleted

## 2022-04-27 DIAGNOSIS — T63441D Toxic effect of venom of bees, accidental (unintentional), subsequent encounter: Secondary | ICD-10-CM | POA: Diagnosis not present

## 2022-05-04 ENCOUNTER — Ambulatory Visit: Payer: PRIVATE HEALTH INSURANCE

## 2022-05-05 ENCOUNTER — Ambulatory Visit (INDEPENDENT_AMBULATORY_CARE_PROVIDER_SITE_OTHER): Payer: PRIVATE HEALTH INSURANCE

## 2022-05-05 DIAGNOSIS — T63441D Toxic effect of venom of bees, accidental (unintentional), subsequent encounter: Secondary | ICD-10-CM | POA: Diagnosis not present

## 2022-05-11 ENCOUNTER — Ambulatory Visit (INDEPENDENT_AMBULATORY_CARE_PROVIDER_SITE_OTHER): Payer: PRIVATE HEALTH INSURANCE | Admitting: *Deleted

## 2022-05-11 DIAGNOSIS — T63441D Toxic effect of venom of bees, accidental (unintentional), subsequent encounter: Secondary | ICD-10-CM | POA: Diagnosis not present

## 2022-05-19 ENCOUNTER — Ambulatory Visit: Payer: PRIVATE HEALTH INSURANCE

## 2022-05-20 ENCOUNTER — Ambulatory Visit (INDEPENDENT_AMBULATORY_CARE_PROVIDER_SITE_OTHER): Payer: PRIVATE HEALTH INSURANCE | Admitting: *Deleted

## 2022-05-20 DIAGNOSIS — T63441D Toxic effect of venom of bees, accidental (unintentional), subsequent encounter: Secondary | ICD-10-CM | POA: Diagnosis not present

## 2022-05-20 DIAGNOSIS — J309 Allergic rhinitis, unspecified: Secondary | ICD-10-CM

## 2022-05-25 ENCOUNTER — Ambulatory Visit: Payer: PRIVATE HEALTH INSURANCE

## 2022-05-27 ENCOUNTER — Ambulatory Visit (INDEPENDENT_AMBULATORY_CARE_PROVIDER_SITE_OTHER): Payer: PRIVATE HEALTH INSURANCE

## 2022-05-27 DIAGNOSIS — T63441D Toxic effect of venom of bees, accidental (unintentional), subsequent encounter: Secondary | ICD-10-CM

## 2022-06-01 ENCOUNTER — Ambulatory Visit (INDEPENDENT_AMBULATORY_CARE_PROVIDER_SITE_OTHER): Payer: PRIVATE HEALTH INSURANCE | Admitting: *Deleted

## 2022-06-01 DIAGNOSIS — T63441D Toxic effect of venom of bees, accidental (unintentional), subsequent encounter: Secondary | ICD-10-CM

## 2022-06-08 ENCOUNTER — Ambulatory Visit: Payer: PRIVATE HEALTH INSURANCE

## 2022-06-09 ENCOUNTER — Ambulatory Visit: Payer: PRIVATE HEALTH INSURANCE | Admitting: Allergy

## 2022-06-17 ENCOUNTER — Ambulatory Visit: Payer: PRIVATE HEALTH INSURANCE

## 2022-06-18 ENCOUNTER — Ambulatory Visit: Payer: PRIVATE HEALTH INSURANCE | Admitting: Podiatry

## 2022-06-18 DIAGNOSIS — M7661 Achilles tendinitis, right leg: Secondary | ICD-10-CM | POA: Diagnosis not present

## 2022-06-18 DIAGNOSIS — L600 Ingrowing nail: Secondary | ICD-10-CM | POA: Diagnosis not present

## 2022-06-18 DIAGNOSIS — M7662 Achilles tendinitis, left leg: Secondary | ICD-10-CM

## 2022-06-20 NOTE — Progress Notes (Signed)
Subjective: Chief Complaint  Patient presents with   Ingrown Toenail    Patient states she needs a nail trim because she believes she is getting ingrown toenails from the nail shop. Patient states half the toenails bilaterally feels ingrown.    Foot Pain    Patient states she would like to discuss EPAT procedure    56 year old female presents the office today for concerns of her nail started to get ingrown again.  No swelling redness or any drainage to the toenail sites.  No open lesions.  Since I saw her she had injuries to her lower extremities and she is been having Achilles issues and she asks about EPAT today.  Objective: AAO x3, NAD DP/PT pulses palpable bilaterally, CRT less than 3 seconds Nails are mildly incurvated along the nail borders of the right hallux the most noticeable.  There is no edema, erythema, drainage or pus or signs of infection.  There is no open lesions.  She does get discomfort in the course the Achilles tendon.  Clinically the tendons appear to be intact.  MMT 5/5.  No area pinpoint tenderness.  No pain with calf compression, swelling, warmth, erythema  Assessment: 56 year old female with ingrown toenails, without signs of infection; Achilles tendinitis  Plan: -All treatment options discussed with the patient including all alternatives, risks, complications.  -We discussed treatments for ingrown toenails.  Ultimate decided to hold off on having the procedure performed again today.  Sharply debrided the nails with any complications or bleeding.  Discussed proper treatment of the toenails as well as making sure that she is not rubbing which could also be a contributing factor. -In regards to the Achilles tendinitis she is doing her exercises and being treated for this.  She wants to consider EPAT.  We can do this for her however our machine is currently unavailable as it is in repair.  However if her symptoms continue she wants to proceed with this treatment we can  schedule her for this. -Patient encouraged to call the office with any questions, concerns, change in symptoms.   Trula Slade DPM

## 2022-06-21 ENCOUNTER — Ambulatory Visit (INDEPENDENT_AMBULATORY_CARE_PROVIDER_SITE_OTHER): Payer: PRIVATE HEALTH INSURANCE | Admitting: *Deleted

## 2022-06-21 DIAGNOSIS — T63441D Toxic effect of venom of bees, accidental (unintentional), subsequent encounter: Secondary | ICD-10-CM | POA: Diagnosis not present

## 2022-06-28 ENCOUNTER — Ambulatory Visit: Payer: PRIVATE HEALTH INSURANCE | Admitting: Podiatry

## 2022-06-30 ENCOUNTER — Ambulatory Visit: Payer: PRIVATE HEALTH INSURANCE

## 2022-07-16 ENCOUNTER — Ambulatory Visit (INDEPENDENT_AMBULATORY_CARE_PROVIDER_SITE_OTHER): Payer: PRIVATE HEALTH INSURANCE

## 2022-07-16 ENCOUNTER — Ambulatory Visit: Payer: PRIVATE HEALTH INSURANCE | Admitting: Podiatry

## 2022-07-16 DIAGNOSIS — T63441D Toxic effect of venom of bees, accidental (unintentional), subsequent encounter: Secondary | ICD-10-CM | POA: Diagnosis not present

## 2022-07-28 ENCOUNTER — Other Ambulatory Visit: Payer: Self-pay | Admitting: Interventional Cardiology

## 2022-07-30 ENCOUNTER — Ambulatory Visit: Payer: PRIVATE HEALTH INSURANCE

## 2022-08-03 ENCOUNTER — Ambulatory Visit: Payer: PRIVATE HEALTH INSURANCE

## 2022-08-09 ENCOUNTER — Ambulatory Visit (INDEPENDENT_AMBULATORY_CARE_PROVIDER_SITE_OTHER): Payer: PRIVATE HEALTH INSURANCE

## 2022-08-09 DIAGNOSIS — T63441D Toxic effect of venom of bees, accidental (unintentional), subsequent encounter: Secondary | ICD-10-CM

## 2022-08-10 ENCOUNTER — Ambulatory Visit: Payer: PRIVATE HEALTH INSURANCE | Admitting: Podiatry

## 2022-08-10 DIAGNOSIS — M7661 Achilles tendinitis, right leg: Secondary | ICD-10-CM

## 2022-08-10 DIAGNOSIS — M7662 Achilles tendinitis, left leg: Secondary | ICD-10-CM

## 2022-08-10 DIAGNOSIS — L600 Ingrowing nail: Secondary | ICD-10-CM | POA: Diagnosis not present

## 2022-08-16 ENCOUNTER — Ambulatory Visit: Payer: PRIVATE HEALTH INSURANCE

## 2022-08-16 NOTE — Progress Notes (Signed)
Subjective: Chief Complaint  Patient presents with   Foot Pain    Left achilles tendon pain, nail trim requested    57 year old female presents the office today for concerns of her nail started to get ingrown again.  She did have ingrown toenails trimmed.  No swelling redness or drainage.  She is also interested in starting EPAT for her ongoing tendinitis of the Achilles.  No recent injury or changes otherwise.  Objective: AAO x3, NAD DP/PT pulses palpable bilaterally, CRT less than 3 seconds Nails are mildly incurvated along the nail borders of the right hallux the most noticeable.  There is no edema, erythema, drainage or pus or signs of infection.   She does get discomfort in the course the Achilles tendon.  Clinically the tendons appear to be intact.  Symptoms unchanged.  MMT 5/5.  No area pinpoint tenderness.   No pain with calf compression, swelling, warmth, erythema  Assessment: 57 year old female with ingrown toenails, without signs of infection; Achilles tendinitis  Plan: -All treatment options discussed with the patient including all alternatives, risks, complications.  -I went ahead and debrided the nails to the any complications or bleeding.  Discussed regular debridement of the nails to help get them to hopefully grow out better.  Discussed different medications for the nails as well. -Will schedule for EPAT for the Achilles.  Continue stretching, icing female as well as shoes and good arch support.  Trula Slade DPM

## 2022-08-17 ENCOUNTER — Ambulatory Visit (INDEPENDENT_AMBULATORY_CARE_PROVIDER_SITE_OTHER): Payer: PRIVATE HEALTH INSURANCE

## 2022-08-17 DIAGNOSIS — T63441D Toxic effect of venom of bees, accidental (unintentional), subsequent encounter: Secondary | ICD-10-CM

## 2022-08-24 ENCOUNTER — Ambulatory Visit (INDEPENDENT_AMBULATORY_CARE_PROVIDER_SITE_OTHER): Payer: PRIVATE HEALTH INSURANCE

## 2022-08-24 ENCOUNTER — Other Ambulatory Visit: Payer: Self-pay

## 2022-08-24 DIAGNOSIS — T63441D Toxic effect of venom of bees, accidental (unintentional), subsequent encounter: Secondary | ICD-10-CM

## 2022-08-24 DIAGNOSIS — M7661 Achilles tendinitis, right leg: Secondary | ICD-10-CM

## 2022-08-24 DIAGNOSIS — L600 Ingrowing nail: Secondary | ICD-10-CM

## 2022-08-24 DIAGNOSIS — M7662 Achilles tendinitis, left leg: Secondary | ICD-10-CM

## 2022-08-24 NOTE — Progress Notes (Signed)
Patient presents for the 1st EPAT treatment today with complaint of bilateral heel pain in the achilles area. 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 This has been ongoing for several months. The patient has tried ice, stretching, NSAIDS and supportive shoe gear with no long term relief.   Most of the pain is located up the back of the heel bilaterally .  ESWT administered and tolerated well.Treatment settings initiated at:   Energy: 15  Ended treatment session today with 3000 shocks at the following settings:   Energy: 15  Frequency: 4.0  Joules: 14.72   Reviewed post EPAT instructions. Advised to avoid ice and NSAIDs throughout the treatment process and to utilize boot or supportive shoes for at least the next 3 days.  Follow up for 2nd treatment in 1 week.

## 2022-08-30 ENCOUNTER — Other Ambulatory Visit: Payer: PRIVATE HEALTH INSURANCE

## 2022-08-30 ENCOUNTER — Ambulatory Visit (INDEPENDENT_AMBULATORY_CARE_PROVIDER_SITE_OTHER): Payer: PRIVATE HEALTH INSURANCE

## 2022-08-30 DIAGNOSIS — T63441D Toxic effect of venom of bees, accidental (unintentional), subsequent encounter: Secondary | ICD-10-CM | POA: Diagnosis not present

## 2022-08-31 ENCOUNTER — Ambulatory Visit (INDEPENDENT_AMBULATORY_CARE_PROVIDER_SITE_OTHER): Payer: PRIVATE HEALTH INSURANCE

## 2022-08-31 DIAGNOSIS — M7662 Achilles tendinitis, left leg: Secondary | ICD-10-CM

## 2022-08-31 DIAGNOSIS — M7661 Achilles tendinitis, right leg: Secondary | ICD-10-CM

## 2022-08-31 NOTE — Progress Notes (Signed)
Patient presents for the 2nd EPAT treatment today with complaint of bilateral heel pain in the achilles area. 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 This has been ongoing for several months. The patient has tried ice, stretching, NSAIDS and supportive shoe gear with no long term relief.   Most of the pain is located up the back of the heel bilaterally .  ESWT administered and tolerated well.Treatment settings initiated at:   Energy: 20  Ended treatment session today with 1258 shocks at the following settings:   Energy: 20  Frequency: 4.0  Joules: 8.227   Reviewed post EPAT instructions. Advised to avoid ice and NSAIDs throughout the treatment process and to utilize boot or supportive shoes for at least the next 3 days.   Was unable to complete treatment today due to machine malfunction. Only the above documented treatment was done to the left foot only, right foot was not treated at all. Will call patient when machine is running properly.   Follow up for 2nd treatment in 1 week or when machine is up and running to complete treatment #2.

## 2022-09-06 ENCOUNTER — Ambulatory Visit (INDEPENDENT_AMBULATORY_CARE_PROVIDER_SITE_OTHER): Payer: PRIVATE HEALTH INSURANCE | Admitting: *Deleted

## 2022-09-06 ENCOUNTER — Ambulatory Visit (INDEPENDENT_AMBULATORY_CARE_PROVIDER_SITE_OTHER): Payer: PRIVATE HEALTH INSURANCE

## 2022-09-06 ENCOUNTER — Ambulatory Visit: Payer: PRIVATE HEALTH INSURANCE

## 2022-09-06 ENCOUNTER — Other Ambulatory Visit: Payer: PRIVATE HEALTH INSURANCE

## 2022-09-06 DIAGNOSIS — T63441D Toxic effect of venom of bees, accidental (unintentional), subsequent encounter: Secondary | ICD-10-CM

## 2022-09-06 DIAGNOSIS — B351 Tinea unguium: Secondary | ICD-10-CM

## 2022-09-06 NOTE — Progress Notes (Signed)
Patient presents for the 2nd EPAT treatment today with complaint of bilateral plantar heel and achilles pain. 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 This has been ongoing for several months. The patient has tried ice, stretching, NSAIDS and supportive shoe gear with no long term relief.   Most of the pain is located up the back of the heel bilaterally. 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   ESWT administered and tolerated well.Treatment settings initiated at:   Energy: 25  Ended treatment session today with 1258 shocks at the following settings:   Energy: 25  Frequency: 4.0  Joules:  Not calculated on loaner machine   Reviewed post EPAT instructions. Advised to avoid ice and NSAIDs throughout the treatment process and to utilize boot or supportive shoes for at least the next 3 days.   Follow up for 3rd treatment in 1 week.   ~We are currently using a loaner machine while our EPAT unit is out for repair.

## 2022-09-13 ENCOUNTER — Ambulatory Visit (INDEPENDENT_AMBULATORY_CARE_PROVIDER_SITE_OTHER): Payer: PRIVATE HEALTH INSURANCE

## 2022-09-13 DIAGNOSIS — M7661 Achilles tendinitis, right leg: Secondary | ICD-10-CM

## 2022-09-13 DIAGNOSIS — M7662 Achilles tendinitis, left leg: Secondary | ICD-10-CM

## 2022-09-13 NOTE — Progress Notes (Signed)
Patient presents for the 3rd EPAT treatment today with complaint of bilateral plantar heel and achilles pain. 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 This has been ongoing for several months. The patient has tried ice, stretching, NSAIDS and supportive shoe gear with no long term relief.   Most of the pain is located up the back of the heel bilaterally. 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   ESWT administered and tolerated well.Treatment settings initiated at:   Energy: 30  Ended treatment session today with 3000 shocks at the following settings:   Energy: 30  Frequency: 5.0  Joules:  Not calculated on loaner machine   Reviewed post EPAT instructions. Advised to avoid ice and NSAIDs throughout the treatment process and to utilize boot or supportive shoes for at least the next 3 days.   Follow up for 4th treatment in 2 weeks.   ~We are currently using a loaner machine while our EPAT unit is out for repair.

## 2022-09-14 ENCOUNTER — Ambulatory Visit (INDEPENDENT_AMBULATORY_CARE_PROVIDER_SITE_OTHER): Payer: PRIVATE HEALTH INSURANCE

## 2022-09-14 DIAGNOSIS — T63441D Toxic effect of venom of bees, accidental (unintentional), subsequent encounter: Secondary | ICD-10-CM | POA: Diagnosis not present

## 2022-09-20 ENCOUNTER — Other Ambulatory Visit: Payer: PRIVATE HEALTH INSURANCE

## 2022-09-21 ENCOUNTER — Ambulatory Visit (INDEPENDENT_AMBULATORY_CARE_PROVIDER_SITE_OTHER): Payer: PRIVATE HEALTH INSURANCE

## 2022-09-21 DIAGNOSIS — Z91038 Other insect allergy status: Secondary | ICD-10-CM

## 2022-09-27 ENCOUNTER — Ambulatory Visit: Payer: PRIVATE HEALTH INSURANCE | Admitting: Podiatry

## 2022-09-28 ENCOUNTER — Ambulatory Visit: Payer: PRIVATE HEALTH INSURANCE | Admitting: Podiatry

## 2022-09-28 ENCOUNTER — Ambulatory Visit (INDEPENDENT_AMBULATORY_CARE_PROVIDER_SITE_OTHER): Payer: PRIVATE HEALTH INSURANCE

## 2022-09-28 ENCOUNTER — Ambulatory Visit: Payer: PRIVATE HEALTH INSURANCE | Admitting: Allergy

## 2022-09-28 ENCOUNTER — Ambulatory Visit: Payer: PRIVATE HEALTH INSURANCE

## 2022-09-28 ENCOUNTER — Other Ambulatory Visit: Payer: PRIVATE HEALTH INSURANCE

## 2022-09-28 ENCOUNTER — Other Ambulatory Visit: Payer: Self-pay

## 2022-09-28 VITALS — BP 136/90 | HR 88 | Temp 98.2°F | Resp 16 | Wt 215.0 lb

## 2022-09-28 DIAGNOSIS — J3089 Other allergic rhinitis: Secondary | ICD-10-CM | POA: Diagnosis not present

## 2022-09-28 DIAGNOSIS — T7840XD Allergy, unspecified, subsequent encounter: Secondary | ICD-10-CM

## 2022-09-28 DIAGNOSIS — L600 Ingrowing nail: Secondary | ICD-10-CM

## 2022-09-28 DIAGNOSIS — T781XXD Other adverse food reactions, not elsewhere classified, subsequent encounter: Secondary | ICD-10-CM

## 2022-09-28 DIAGNOSIS — L299 Pruritus, unspecified: Secondary | ICD-10-CM

## 2022-09-28 DIAGNOSIS — T63441D Toxic effect of venom of bees, accidental (unintentional), subsequent encounter: Secondary | ICD-10-CM | POA: Diagnosis not present

## 2022-09-28 DIAGNOSIS — E882 Lipomatosis, not elsewhere classified: Secondary | ICD-10-CM

## 2022-09-28 DIAGNOSIS — Z889 Allergy status to unspecified drugs, medicaments and biological substances status: Secondary | ICD-10-CM

## 2022-09-28 DIAGNOSIS — L603 Nail dystrophy: Secondary | ICD-10-CM | POA: Diagnosis not present

## 2022-09-28 DIAGNOSIS — Z91038 Other insect allergy status: Secondary | ICD-10-CM

## 2022-09-28 MED ORDER — CARBINOXAMINE MALEATE 4 MG PO TABS: ORAL_TABLET | ORAL | 3 refills | Status: DC

## 2022-09-28 NOTE — Patient Instructions (Addendum)
Allergic reaction Continue to avoid scallops and red dye. Keep track of reactions. If you have a reaction next time, please get tryptase level drawn within 2-3 hours. This will help in identifying if it's truly an allergic reaction or some other type of adverse reaction that you are having.  For mild symptoms you can take over the counter antihistamines such as Benadryl and monitor symptoms closely. If symptoms worsen or if you have severe symptoms including breathing issues, throat closure, significant swelling, whole body hives, severe diarrhea and vomiting, lightheadedness then inject epinephrine and seek immediate medical care afterwards. Emergency action plan in place.    Hymenoptera allergy Continue mixed vespid injections (contains yellow jacket, yellow hornet and white faced hornet) - given today.  Make sure you take your allergy medication the day of injection. If you do well with the build up then will add on honey bee and wasp together. Carry Epipen and use for anaphylactic reactions as needed.  Get bloodwork We are ordering labs, so please allow 1-2 weeks for the results to come back. With the newly implemented Cures Act, the labs might be visible to you at the same time that they become visible to me. However, I will not address the results until all of the results are back, so please be patient.  In the meantime, continue recommendations in your patient instructions, including avoidance measures (if applicable), until you hear from me.   Allergic rhinitis 2020 immunocap borderline positive to few pollens.  Start Ryaltris (olopatadine + mometasone nasal spray combination) 1-2 sprays per nostril twice a day. Sample given. This replaces your other nasal sprays. Let me know if this works as well.  Take carbinoxamine '4mg'$  to '6mg'$  1-2 times a day as needed. This will replace the other antihistamines.  Nasal saline spray (i.e., Simply Saline) or nasal saline lavage (i.e., NeilMed) is  recommended as needed and prior to medicated nasal sprays. May use olopatadine eye drops 0.2% once a day as needed for itchy/watery eyes Continue environmental control measures to pollen.    Multiple drug allergies Continue to avoid medications on allergy list.     Adverse food reaction Continue to avoid scallops and red dye.  For mild symptoms you can take over the counter antihistamines such as Benadryl and monitor symptoms closely. If symptoms worsen or if you have severe symptoms including breathing issues, throat closure, significant swelling, whole body hives, severe diarrhea and vomiting, lightheadedness then inject epinephrine and seek immediate medical care afterwards.   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  Return in about 6 months or sooner if needed.   Reducing Pollen Exposure Pollen seasons: trees (spring), grass (summer) and ragweed/weeds (fall). Keep windows closed in your home and car to lower pollen exposure.  Install air conditioning in the bedroom and throughout the house if possible.  Avoid going out in dry windy days - especially early morning. Pollen counts are highest between 5 - 10 AM and on dry, hot and windy days.  Save outside activities for late afternoon or after a heavy rain, when pollen levels are lower.  Avoid mowing of grass if you have grass pollen allergy. Be aware that pollen can also be transported indoors on people and pets.  Dry your clothes in an automatic dryer rather than hanging them outside where they might collect pollen.  Rinse hair and eyes before bedtime.  Skin care recommendations  Bath time: Always use lukewarm water. AVOID very hot or cold water. Keep bathing  time to 5-10 minutes. Do NOT use bubble bath. Use a mild soap and use just enough to wash the dirty areas. Do NOT scrub skin vigorously.  After bathing, pat dry your skin with a towel. Do NOT rub or scrub the skin.  Moisturizers and  prescriptions:  ALWAYS apply moisturizers immediately after bathing (within 3 minutes). This helps to lock-in moisture. Use the moisturizer several times a day over the whole body. Good summer moisturizers include: Aveeno, CeraVe, Cetaphil. Good winter moisturizers include: Aquaphor, Vaseline, Cerave, Cetaphil, Eucerin, Vanicream. When using moisturizers along with medications, the moisturizer should be applied about one hour after applying the medication to prevent diluting effect of the medication or moisturize around where you applied the medications. When not using medications, the moisturizer can be continued twice daily as maintenance.  Laundry and clothing: Avoid laundry products with added color or perfumes. Use unscented hypo-allergenic laundry products such as Tide free, Cheer free & gentle, and All free and clear.  If the skin still seems dry or sensitive, you can try double-rinsing the clothes. Avoid tight or scratchy clothing such as wool. Do not use fabric softeners or dyer sheets.

## 2022-09-28 NOTE — Progress Notes (Unsigned)
Follow Up Note  RE: [name] [name] MRN: [id number] DOB: February 25, 1966 Date of Office Visit: 09/28/2022  Referring provider: Ginger Organ., MD Primary care provider: Ginger Organ., MD  Chief Complaint: Allergic Rhinitis  (Constant runny nose )  History of Present Illness: I had the pleasure of seeing [name] [name] for a follow up visit at the [facility] on 09/30/2022. [name] [name] is a 57 y.o. 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 Her previous allergy office visit was on 11/25/2021 with Dr. Maudie Mercury. Today is a regular follow up visit.  Hymenoptera allergy/allergic reactions Currently on build up for mixed vespid injections and doing well on it.  [name] [name] used Epipen in June after being stung by an ant - due to shortness of breath, ear itching.  [name] [name] had to pause on her injections for awhile last year.   [name] [name] wants to continue injections as [name] [name] is outdoors a lot.    Allergic rhinitis Patient was seen by [facility]NT and had rhinoscopy done and was not found to have any major anatomical issues.  [name] [name] had issues with dry eyes last year.   [name] [name] is still having PND, coughing, and rhinorrhea. Feels sinus congestion/pressure.   Currently on Atrovent 2 sprays per nostril once a day and Xyzal daily with unknown benefit. [name] [name] does have a deviated septum.   03/03/2022 ENT visit: "Patient with history of environmental allergies, previously on SCIT, and Dercum's disease with increasing nasal congestion despite use of Zyrtec. Boggy nasal mucosa and left septal deviation on scope today. Will start azelastine and montelukast. Continue Ipratropium spray prn for watery nasal drainage when [name] [name] eats, consistent with vasomotor rhinitis. [name] [name] would like to avoid nasal steroids, as her dermatologist has advised her steroids could exacerbate her Dercum,'s disease.  Will scope  throat at follow up of cough/throat clearing not improving. On proton pump inhibitor daily. "  Assessment and Plan: [name] [name] is a 57 y.o. female with: Hymenoptera allergy Past history - Concern regarding hymenoptera allergy as [name] [name] developed some respiratory symptoms after getting stung by a yellow jacket. 2020 Hymenoptera panel was positive to honeybee, white faced hornet, yellow jacket, wasp, yellow hornet and bumblebee. Borderline to fire ant. Interim history - used Epipen in June 2023 after an ant sting. Still building up on VIT. No issues now.  Continue mixed vespid injections (contains yellow jacket, yellow hornet and white faced hornet) - given today.  Make sure you take your allergy medication the day of injection. If you do well with the build up then will add on honey bee and wasp together. Carry Epipen and use for anaphylactic reactions as needed.  Get bloodwork to see if any of the levels changed since 2020.  Other allergic rhinitis Past history - Perennial rhino conjunctivitis symptoms for many years with worsening in the fall. Patient was on AIT for 3 years but the last year developed large localized reactions so [name] [name] stopped the injections. [name] [name] believes they were helping her symptoms. 2020 immunocap borderline positive to few pollens. Azelastine not effective.  Interim history - saw ENT no major anatomical issues. Still having PND, rhinorrhea, congestion.  Start Ryaltris (olopatadine + mometasone nasal spray combination) 1-2 sprays per nostril twice a day. Sample given. This replaces your other nasal sprays. Let me know if this works as well.  Take carbinoxamine '4mg'$  to '6mg'$  1-2 times a day as needed.  This will replace the other antihistamines.  Nasal saline spray (i.e., Simply Saline) or nasal saline lavage (i.e., NeilMed) is recommended as needed and prior to medicated nasal sprays. May use olopatadine eye drops 0.2% once a day as needed for itchy/watery eyes Continue environmental  control measures to pollen.   Allergic reaction Past history - 57 year old female who presents with a myriad of symptoms but concerned about increasing reactions to various things including foods, chemicals, environment, drugs since her diagnosis of Dercum's disease. Bloodwork - hymenoptera panel was positive to honeybee, white faced hornet, yellow jacket, wasp, yellow hornet and bumblebee and fire ant. Shellfish panel and seafood panel were all negative. The scallop and oyster were borderline positive. Tryptase, blood count, urticaria index, and alpha gal were all normal. 2,3 Dinor-11Beta-Prostaglandin F2 Alpha, Urine - 2157pg/mg Cr (ref <5205pg/mg Cr) - normal.  Interim history - used Epi in June 2023 after an ant bite. Keep track of reactions. If you have a reaction next time, please get tryptase level drawn within 2-3 hours. This will help in identifying if it's truly an allergic reaction or some other type of adverse reaction that you are having.  For mild symptoms you can take over the counter antihistamines such as Benadryl and monitor symptoms closely. If symptoms worsen or if you have severe symptoms including breathing issues, throat closure, significant swelling, whole body hives, severe diarrhea and vomiting, lightheadedness then inject epinephrine and seek immediate medical care afterwards. Emergency action plan in place.  Continue to avoid scallops and red dye.   Multiple drug allergies Past history - Patient is sensitive to various medications. Some of the reactions are more adverse drug reactions rather than IgE mediated reactions. Continue to avoid medications which gave her issues in the past. See allergy list.   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 Continue follow up with dermatology, neurology, cardiology.  Return in about 6 months (around 03/29/2023).  Meds ordered this encounter  Medications    Carbinoxamine Maleate 4 MG TABS    Sig: Take 1 to 1.5 tablet 1-2 times a day as needed for allergies.    Dispense:  90 tablet    Refill:  3   Lab Orders         Allergens w/Total IgE Area 2         Tryptase         Allergen Hymenoptera Panel         Allergen Fire Ant         Allergen Scallops f338         Tryptase      Diagnostics: None.   Medication List:  Current Outpatient Medications  Medication Sig Dispense Refill   aspirin EC 81 MG tablet Take 1 tablet (81 mg total) by mouth daily. 90 tablet 3   Carbinoxamine Maleate 4 MG TABS Take 1 to 1.5 tablet 1-2 times a day as needed for allergies. 90 tablet 3   clobetasol (TEMOVATE) 0.05 % external solution clobetasol 0.05 % scalp solution  APPLY TO THE SCALP DAILY AS NEEDED FOR ITCH     EPINEPHrine (EPIPEN 2-PAK) 0.3 mg/0.3 mL IJ SOAJ injection Inject 0.3 mg into the muscle as needed. 1 each 1   estradiol (ESTRACE) 1 MG tablet      Evolocumab (REPATHA SURECLICK) [id number] MG/ML SOAJ Inject 1 pen into the skin every 14 (fourteen) days. 2 mL 11   ezetimibe (ZETIA) 10 MG tablet  Take 1 tablet (10 mg total) by mouth daily. 90 tablet 3   furosemide (LASIX) 20 MG tablet Take 1 tablet (20 mg total) by mouth daily as needed for fluid or edema. 30 tablet 3   icosapent Ethyl (VASCEPA) 1 g capsule Take 2 capsules (2 g total) by mouth 2 (two) times daily. 360 capsule 0   ipratropium (ATROVENT) 0.03 % nasal spray Place 1-2 sprays into both nostrils 2 (two) times daily as needed (nasal drainage). 30 mL 5   ketoconazole (NIZORAL) 2 % shampoo      metroNIDAZOLE (METROGEL) 0.75 % gel metronidazole 0.75 % topical gel  APPLY ON THE SKIN DAILY FOR ROSACEA     Multiple Vitamins-Calcium (ONE-A-DAY WOMENS PO) Take 1 tablet by mouth daily.     olmesartan (BENICAR) 20 MG tablet Take 20 mg by mouth daily.     Olopatadine HCl 0.2 % SOLN Apply 1 drop to eye daily as needed (itchy/watery eyes). 2.5 mL 5   ondansetron (ZOFRAN ODT) 4 MG disintegrating tablet Take 1  tablet (4 mg total) by mouth every 8 (eight) hours as needed for nausea or vomiting. 20 tablet 0   RABEprazole (ACIPHEX) 20 MG tablet Take 20 mg by mouth 2 (two) times daily.     rizatriptan (MAXALT-MLT) 10 MG disintegrating tablet Take 10 mg by mouth every 2 (two) hours as needed for migraine.   11   rosuvastatin (CRESTOR) 5 MG tablet Take 1 tablet (5 mg total) by mouth daily. 90 tablet 3   Vitamin D, Ergocalciferol, (DRISDOL) 50000 units CAPS capsule Take 50,000 Units by mouth every Wednesday.      No current facility-administered medications for this visit.   Allergies: Allergies  Allergen Reactions   Augmentin [Amoxicillin-Pot Clavulanate] Anaphylaxis and Nausea And Vomiting   Betadine [Povidone Iodine]     Throat swelling, itchy lips, redness/swelling at application site.   Contrast Media [Iodinated Contrast Media] Anaphylaxis   Morphine And Related Anaphylaxis and Nausea And Vomiting   Prednisone Anaphylaxis   Yellow Jacket Venom [Bee Venom] Anaphylaxis   Azithromycin Other (See Comments)    SEVERE STOMACH PAIN    Erythromycin Nausea And Vomiting and Other (See Comments)    SEVERE STOMACH PAIN/abdominal pain    Iohexol Hives, Itching and Swelling    Swelling of upper lip, thick tongue, hives on face and back and itching all over   Oxycontin [Oxycodone] Nausea And Vomiting   Avelox [Moxifloxacin Hcl In Nacl] Swelling   Codeine Nausea And Vomiting and Other (See Comments)    Headaches also   Gadolinium Derivatives Hives, Itching and Swelling   Iodine    Lamisil [Terbinafine]     Pt stated, "It made me feel like I had the flu"   Levaquin [Levofloxacin In D5w] Swelling   Oxycodone-Acetaminophen Nausea And Vomiting   Pantoprazole Other (See Comments)    "Feels like I have the flu"   Praluent [Alirocumab]     Swelling in leg, pain, dizziness   Pravastatin     myalgias   Rosuvastatin     Myalgias and cramps, sore throat, indigestion   Shellfish Allergy    Dilaudid  [Hydromorphone Hcl] Nausea And Vomiting   Oxycodone Hcl Nausea And Vomiting   Tramadol Nausea Only   I reviewed her past medical history, social history, family history, and environmental history and no significant changes have been reported from her previous visit.  Review of Systems  Constitutional:  Negative for appetite change, chills and fever.  HENT:  Positive for congestion, postnasal drip and rhinorrhea.   Respiratory:  Positive for cough. Negative for chest tightness, shortness of breath and wheezing.   Skin:  Negative for rash.  Allergic/Immunologic: Positive for environmental allergies.    Objective: BP (!) 136/90 Comment: Did not take BP medications  Pulse 88   Temp 98.2 F (36.8 C)   Resp 16   Wt 215 lb (97.5 kg)   SpO2 96%   BMI 35.78 kg/m  Body mass index is 35.78 kg/m. Physical Exam Vitals and nursing note reviewed.  Constitutional:      Appearance: Normal appearance. [name] [name] is well-developed.  HENT:     Head: Normocephalic and atraumatic.     Right Ear: Tympanic membrane and external ear normal.     Left Ear: Tympanic membrane and external ear normal.     Nose: Congestion (on right side) present.     Mouth/Throat:     Mouth: Mucous membranes are moist.     Pharynx: Oropharynx is clear.  Eyes:     Conjunctiva/sclera: Conjunctivae normal.  Cardiovascular:     Rate and Rhythm: Normal rate and regular rhythm.     Heart sounds: Normal heart sounds. No murmur heard. Pulmonary:     Effort: Pulmonary effort is normal.     Breath sounds: Normal breath sounds. No wheezing, rhonchi or rales.  Musculoskeletal:     Cervical back: Neck supple.  Skin:    General: Skin is warm.     Findings: No rash.  Neurological:     Mental Status: [name] [name] is alert and oriented to person, place, and time.  Psychiatric:        Behavior: Behavior normal.   Previous notes and tests were reviewed. The plan was reviewed with the patient/family, and all questions/concerned were  addressed.  It was my pleasure to see [name] [name] today and participate in her care. Please feel free to contact me with any questions or concerns.  Sincerely,  [name], DO Allergy & Immunology  [facility] office: [place] office: [phone]

## 2022-09-29 ENCOUNTER — Ambulatory Visit: Payer: PRIVATE HEALTH INSURANCE | Admitting: Allergy

## 2022-09-30 ENCOUNTER — Encounter: Payer: Self-pay | Admitting: Allergy

## 2022-09-30 NOTE — Assessment & Plan Note (Signed)
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 Continue follow up with dermatology, neurology, cardiology.

## 2022-09-30 NOTE — Assessment & Plan Note (Signed)
Past history - Concern regarding hymenoptera allergy as she developed some respiratory symptoms after getting stung by a yellow jacket. 2020 Hymenoptera panel was positive to honeybee, white faced hornet, yellow jacket, wasp, yellow hornet and bumblebee. Borderline to fire ant. Interim history - used Epipen in June 2023 after an ant sting. Still building up on VIT. No issues now.  Continue mixed vespid injections (contains yellow jacket, yellow hornet and white faced hornet) - given today.  Make sure you take your allergy medication the day of injection. If you do well with the build up then will add on honey bee and wasp together. Carry Epipen and use for anaphylactic reactions as needed.  Get bloodwork to see if any of the levels changed since 2020.

## 2022-09-30 NOTE — Assessment & Plan Note (Signed)
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 Patient was on AIT for 3 years but the last year developed large localized reactions so she stopped the injections. She believes they were helping her symptoms. 2020 immunocap borderline positive to few pollens. Azelastine not effective.  Interim history - saw ENT no major anatomical issues. Still having PND, rhinorrhea, congestion.  Start Ryaltris (olopatadine + mometasone nasal spray combination) 1-2 sprays per nostril twice a day. Sample given. This replaces your other nasal sprays. Let me know if this works as well.  Take carbinoxamine '4mg'$  to '6mg'$  1-2 times a day as needed. This will replace the other antihistamines.  Nasal saline spray (i.e., Simply Saline) or nasal saline lavage (i.e., NeilMed) is recommended as needed and prior to medicated nasal sprays. May use olopatadine eye drops 0.2% once a day as needed for itchy/watery eyes Continue environmental control measures to pollen.

## 2022-09-30 NOTE — Assessment & Plan Note (Signed)
Past history - Patient is sensitive to various medications. Some of the reactions are more adverse drug reactions rather than IgE mediated reactions. Continue to avoid medications which gave her issues in the past. See allergy list.

## 2022-09-30 NOTE — Assessment & Plan Note (Signed)
Past history - 57 year old female who presents with a myriad of symptoms but concerned about increasing reactions to various things including foods, chemicals, environment, drugs since her diagnosis of Dercum's disease. Bloodwork - hymenoptera panel was positive to honeybee, white faced hornet, yellow jacket, wasp, yellow hornet and bumblebee and fire ant. Shellfish panel and seafood panel were all negative. The scallop and oyster were borderline positive. Tryptase, blood count, urticaria index, and alpha gal were all normal. 2,3 Dinor-11Beta-Prostaglandin F2 Alpha, Urine - 2157pg/mg Cr (ref <5205pg/mg Cr) - normal.  Interim history - used Epi in June 2023 after an ant bite. Keep track of reactions. If you have a reaction next time, please get tryptase level drawn within 2-3 hours. This will help in identifying if it's truly an allergic reaction or some other type of adverse reaction that you are having.  For mild symptoms you can take over the counter antihistamines such as Benadryl and monitor symptoms closely. If symptoms worsen or if you have severe symptoms including breathing issues, throat closure, significant swelling, whole body hives, severe diarrhea and vomiting, lightheadedness then inject epinephrine and seek immediate medical care afterwards. Emergency action plan in place.  Continue to avoid scallops and red dye.

## 2022-10-05 NOTE — Progress Notes (Signed)
Subjective: Chief Complaint  Patient presents with   Nail Problem    RM 13 RFC bilateral nail trim     57 year old female presents the office today for concerns of her nail started to get ingrown again.  No swelling or redness or drainage at this time.  She was doing EPAT but she went back to see orthopedic surgeon concerned on the lipoma so she is on hold off on further treatment for this.  Objective: AAO x3, NAD DP/PT pulses palpable bilaterally, CRT less than 3 seconds Nails are mildly incurvated along the nail borders of the right hallux the most noticeable.  There is no edema, erythema, drainage or pus or signs of infection.  She does get discomfort in the course the Achilles tendon.  Mild thickening noted along the mid substance of the left side.  No evidence of rupture.  Clinically the tendons appear to be intact.  Symptoms unchanged.  MMT 5/5.  No area pinpoint tenderness.   No pain with calf compression, swelling, warmth, erythema  Assessment: 57 year old female with ingrown toenails, without signs of infection; Achilles tendinitis  Plan: -All treatment options discussed with the patient including all alternatives, risks, complications.  -I went ahead and debrided the nails to the any complications or bleeding.  Discussed regular debridement of the nails to help get them to hopefully grow out better.  Discussed different medications for the nails as well.I think she can get back to regular pedicures. -Defer to orthopedics for her Achilles tendinitis.  Trula Slade DPM

## 2022-10-06 ENCOUNTER — Ambulatory Visit: Payer: PRIVATE HEALTH INSURANCE

## 2022-10-07 ENCOUNTER — Ambulatory Visit: Payer: PRIVATE HEALTH INSURANCE

## 2022-10-07 DIAGNOSIS — Z91038 Other insect allergy status: Secondary | ICD-10-CM

## 2022-10-28 ENCOUNTER — Encounter: Payer: Self-pay | Admitting: Podiatry

## 2022-11-04 ENCOUNTER — Ambulatory Visit: Payer: PRIVATE HEALTH INSURANCE | Admitting: Neurology

## 2022-11-04 ENCOUNTER — Telehealth: Payer: Self-pay

## 2022-11-04 NOTE — Telephone Encounter (Signed)
**Note De-Identified  Obfuscation** I started a Icosapent PA through covermymeds. Key: [id number]

## 2022-11-04 NOTE — Telephone Encounter (Signed)
**Note De-Identified  Obfuscation** [name] [name] (KeyArdis Hughs) PA Case ID #: [id number] Rx #: [id number] Outcome: Approved today Request Reference Number: [id number]. ICOSAPENT CAP 1GM is approved through 11/04/2023. Authorization Expiration Date: 11/04/2023 Drug Icosapent Ethyl 1GM capsules ePA cloud logo Form [facility] Electronic Prior Authorization Form [id number] NCPDP) Original Claim Info 75 Call [phone] for PA Drug Requires Prior Authorization  I have notified [facility] [place] (Ph: [phone]) of this approval and I did request that they fill the RX and to notify the pt when ready for pick up.

## 2022-11-16 ENCOUNTER — Ambulatory Visit: Payer: PRIVATE HEALTH INSURANCE | Admitting: Podiatry

## 2022-11-16 ENCOUNTER — Encounter: Payer: Self-pay | Admitting: Podiatry

## 2022-11-16 DIAGNOSIS — L603 Nail dystrophy: Secondary | ICD-10-CM

## 2022-11-16 DIAGNOSIS — B353 Tinea pedis: Secondary | ICD-10-CM | POA: Diagnosis not present

## 2022-11-16 DIAGNOSIS — L600 Ingrowing nail: Secondary | ICD-10-CM

## 2022-11-16 MED ORDER — KETOCONAZOLE 2 % EX CREA: 1.0000 | TOPICAL_CREAM | Freq: Every day | CUTANEOUS | 2 refills | Status: DC

## 2022-11-16 NOTE — Progress Notes (Signed)
  Subjective:  Patient ID: [name] [name], female    DOB: January 20, 1966,   MRN: [id number]  Chief Complaint  Patient presents with   Nail Problem    Routine foot care    57 y.o. female presents for nail trimming. She has been following with Dr. Ardelle Anton dealing with ingrown nails and dystrophy of the nail. Relates they have been trying to let grow out and trim because in the past ingrown nail procedure did not go well. She also has concern for  itching at night in between her toes.  . Denies any other pedal complaints. Denies n/v/f/c.   Past Medical History:  Diagnosis Date   Anemia    many years ago   Anxiety    situational   Arthritis    "hands" (07/12/2012)   Asthma    seasonal    Chest pain    Chest pain at rest, on going 07/12/2012   Dercum's disease    Eczema    Family history of early CAD 07/12/2012   Fibromyalgia    GERD (gastroesophageal reflux disease)    Headache(784.0)    "often; not daily" (07/12/2012)   Hypertension    not on medications   Kidney stones    Migraine    Pneumonia    as a child   PONV (postoperative nausea and vomiting)    she states she gets very sick    Objective:  Physical Exam: Vascular: DP/PT pulses 2/4 bilateral. CFT <3 seconds. Normal hair growth on digits. No edema.  Skin. No lacerations or abrasions bilateral feet. Nails 1-5 bilateral are elongated and right great toenail incruvated at medial border. Scaling and erythema noted in third interspace on left.  Musculoskeletal: MMT 5/5 bilateral lower extremities in DF, PF, Inversion and Eversion. Deceased ROM in DF of ankle joint.  Neurological: Sensation intact to light touch.   Assessment:   1. Ingrown toenail   2. Onychodystrophy   3. Tinea pedis of left foot      Plan:  Patient was evaluated and treated and all questions answered. -Mechanically debrided all nails 1-5 bilateral using sterile nail nipper and filed with dremel without incident  -Discussed may need one  more trimming then can get back to regular pedicures.  Continue care with ortho for achilles tendonitis.  Ketoconazole prescribed for tinea to be applied daily  -Answered all patient questions -Patient to return for additional trimming.    Louann Sjogren, DPM

## 2022-11-24 ENCOUNTER — Emergency Department (HOSPITAL_COMMUNITY): Payer: PRIVATE HEALTH INSURANCE

## 2022-11-24 ENCOUNTER — Other Ambulatory Visit: Payer: Self-pay

## 2022-11-24 ENCOUNTER — Encounter (HOSPITAL_COMMUNITY): Payer: Self-pay

## 2022-11-24 ENCOUNTER — Emergency Department (HOSPITAL_COMMUNITY)
Admission: EM | Admit: 2022-11-24 | Discharge: 2022-11-24 | Disposition: A | Discharge status: Home / Self Care | Payer: PRIVATE HEALTH INSURANCE | Attending: Emergency Medicine | Admitting: Emergency Medicine

## 2022-11-24 DIAGNOSIS — I82411 Acute embolism and thrombosis of right femoral vein: Secondary | ICD-10-CM | POA: Diagnosis not present

## 2022-11-24 DIAGNOSIS — M7989 Other specified soft tissue disorders: Secondary | ICD-10-CM | POA: Diagnosis present

## 2022-11-24 MED ORDER — APIXABAN 5 MG PO TABS
10.0000 mg | ORAL_TABLET | Freq: Once | ORAL | Status: AC
  Administered 2022-11-24: 10 mg via ORAL
  Filled 2022-11-24: qty 2

## 2022-11-24 MED ORDER — APIXABAN (ELIQUIS) VTE STARTER PACK (10MG AND 5MG): ORAL_TABLET | ORAL | 0 refills | Status: DC

## 2022-11-24 MED ORDER — APIXABAN (ELIQUIS) EDUCATION KIT FOR DVT/PE PATIENTS: PACK | Freq: Once | Status: DC

## 2022-11-24 NOTE — ED Triage Notes (Signed)
Pt c/o of swelling to lower leg. Stated yesterday that it was red and warm to the touch. Seen ortho last Wednesday and they recommended a stat MRI, insurance denied it, recommended that she come here.

## 2022-11-24 NOTE — ED Provider Notes (Signed)
[facility] Provider Note   CSN: [id number] Arrival date & time: 11/24/22  1002     History  Chief Complaint  Patient presents with   Leg Injury   Leg Swelling    [name] [name] is a 57 y.o. female.  HPI 57 year old female presents with right leg swelling and pain. Has been having symptoms for around 4 weeks.  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  She thinks that was causing her symptoms today.  The pain is gotten so bad that is difficult for her to walk and very painful.  She took some leftover hydrocodone.  Yesterday her leg was more swollen and red though the swelling has been cut in half and there is no longer any redness.  No fevers.  It is painful to bend her knee.  The primary focus of pain is her right calf as well as her right posterior medial thigh/knee.  She has a burning type pain as well.  No numbness. She is supposed to have an MRI that was ordered by her orthopedist (right thigh and right tib/fib) but there has been trouble getting this as an outpatient and her symptoms are worsening.  Home Medications Prior to Admission medications   Medication Sig Start Date End Date Taking? Authorizing Provider  APIXABAN Everlene Balls) VTE STARTER PACK (  AND ) Take as directed on package: start with two-5mg  tablets twice daily for 7 days. On day 8, switch to one-5mg  tablet twice daily. 11/24/22  Yes Pricilla Loveless, MD  EPINEPHrine (EPIPEN 2-PAK) 0.3 mg/0.3 mL IJ SOAJ injection Inject 0.3 mg into the muscle as needed. 11/25/21  Yes Ellamae Sia, DO  estradiol (ESTRACE) 1 MG tablet Take 1 mg by mouth daily. 04/11/19  Yes [provider]  ezetimibe (ZETIA) 10 MG tablet Take 1 tablet (10 mg total) by mouth daily. 11/17/21  Yes Jake Bathe, MD  icosapent Ethyl (VASCEPA) 1 g capsule Take 2 capsules (2 g total) by mouth 2 (two) times daily. 07/28/22  Yes Jake Bathe, MD  ipratropium  (ATROVENT) 0.03 % nasal spray Place 1-2 sprays into both nostrils 2 (two) times daily as needed (nasal drainage). 11/25/21  Yes Ellamae Sia, DO  ketoconazole (NIZORAL) 2 % cream Apply 1 Application topically daily. 11/16/22  Yes Louann Sjogren, DPM  ketoconazole (NIZORAL) 2 % shampoo Apply 1 Application topically 2 (two) times a week. 04/11/19  Yes [provider]  metroNIDAZOLE (METROGEL) 0.75 % gel metronidazole 0.75 % topical gel  APPLY ON THE SKIN DAILY FOR ROSACEA   Yes [provider]  olmesartan (BENICAR) 20 MG tablet Take 20 mg by mouth daily. 11/03/18  Yes [provider]  RABEprazole (ACIPHEX) 20 MG tablet Take 20 mg by mouth 2 (two) times daily.   Yes [provider]  rizatriptan (MAXALT-MLT) 10 MG disintegrating tablet Take 10 mg by mouth every 2 (two) hours as needed for migraine.  06/18/15  Yes [provider]  rosuvastatin (CRESTOR) 5 MG tablet Take 1 tablet (5 mg total) by mouth daily. 11/17/21  Yes Jake Bathe, MD  Vitamin D, Ergocalciferol, (DRISDOL) 50000 units CAPS capsule Take 50,000 Units by mouth every Wednesday.    Yes [provider]      Allergies    Augmentin [amoxicillin-pot clavulanate], Betadine [povidone iodine], Contrast media [iodinated contrast media], Morphine and related, Prednisone, Yellow jacket venom [bee venom], Azithromycin, Erythromycin,  Iohexol, Oxycontin [oxycodone], Avelox [moxifloxacin hcl in nacl], Codeine, Gadolinium derivatives, Iodine, Lamisil [terbinafine], Levaquin [levofloxacin in d5w], Oxycodone-acetaminophen, Pantoprazole, Praluent [alirocumab], Pravastatin, Rosuvastatin, Shellfish allergy, Dilaudid [hydromorphone hcl], Oxycodone hcl, and Tramadol    Review of Systems   Review of Systems  Constitutional:  Negative for fever.  Cardiovascular:  Positive for leg swelling.  Musculoskeletal:  Positive for arthralgias and myalgias.  Neurological:  Negative for numbness.    Physical Exam Updated  Vital Signs BP 109/67 (BP Location: Left Arm)   Pulse 73   Temp 97.7 F (36.5 C) (Oral)   Resp 15   SpO2 97%  Physical Exam Vitals and nursing note reviewed.  Constitutional:      General: She is not in acute distress.    Appearance: She is well-developed. She is not ill-appearing or diaphoretic.  HENT:     Head: Normocephalic and atraumatic.  Cardiovascular:     Rate and Rhythm: Normal rate and regular rhythm.     Pulses:          Dorsalis pedis pulses are 2+ on the right side.  Pulmonary:     Effort: Pulmonary effort is normal.  Musculoskeletal:       Legs:     Comments: There is mostly tenderness at the indicated location, but some general tenderness to the right calf and when flexing right knee. No apparent knee effusion.  Skin:    General: Skin is warm and dry.  Neurological:     Mental Status: She is alert.     ED Results / Procedures / Treatments   Labs (all labs ordered are listed, but only abnormal results are displayed) Labs Reviewed - No data to display  EKG None  Radiology MR TIBIA FIBULA RIGHT WO CONTRAST  Result Date: 11/24/2022 CLINICAL DATA:  Soft tissue mass, lower leg, deep. EXAM: MRI OF LOWER RIGHT EXTREMITY WITHOUT CONTRAST TECHNIQUE: Multiplanar, multisequence MR imaging of the right lower extremity was performed. No intravenous contrast was administered. COMPARISON:  None Available. FINDINGS: Bones/Joint/Cartilage Marrow signal is within normal limits. No evidence of fracture or osteonecrosis. Small knee joint effusion. Ligaments Intact. Muscles and Tendons Muscles are normal in bulk. Tendons of the flexor, extensor compartments are intact. No intramuscular mass. Soft tissues Subcutaneous soft tissue edema about the anterior, lateral and medial aspect of the lower leg. No fluid collection or hematoma. IMPRESSION: 1. Subcutaneous soft tissue edema about the anterior, lateral and medial aspect of the lower leg without fluid collection or hematoma. 2. No  evidence of fracture or osteonecrosis. 3. Muscles and tendons are intact. 4. Small knee joint effusion. 5. No evidence of soft tissue mass. Electronically Signed   By: Larose Hires D.O.   On: 11/24/2022 14:32   MR ZOXWR RIGHT WO CONTRAST  Result Date: 11/24/2022 CLINICAL DATA:  Soft tissue mass, thigh, deep. Soft tissue masses throughout the right upper and lower leg. EXAM: MRI OF THE RIGHT FEMUR WITHOUT CONTRAST TECHNIQUE: Multiplanar, multisequence MR imaging of the right femur was performed. No intravenous contrast was administered. COMPARISON:  None Available. FINDINGS: Bones/Joint/Cartilage Marrow signal is within normal limits. Mild arthritic changes of the right hip joint as well as right knee joint. Small knee joint effusion. Ligaments Intact. Muscles and Tendons Muscles are normal in bulk. No intramuscular hematoma or appreciable muscular mass. Small Baker's cyst. Soft tissues Skin and subcutaneous soft tissues are within normal limits. No appreciable soft tissue mass. Subcutaneous soft tissue edema about the anteromedial aspect of the knee. IMPRESSION: 1. No  evidence of soft tissue mass or fluid collection. 2. Subcutaneous soft tissue edema about the anteromedial aspect of the knee. 3. Mild arthritic changes of the right hip joint as well as right knee joint. Small knee joint effusion. 4. Small Baker's cyst. Electronically Signed   By: Larose Hires D.O.   On: 11/24/2022 14:29   US Venous Img Lower Unilateral Right  Result Date: 11/24/2022 CLINICAL DATA:  Right lower extremity pain, erythema, and swelling for 1 month EXAM: RIGHT LOWER EXTREMITY VENOUS DOPPLER ULTRASOUND TECHNIQUE: Gray-scale sonography with compression, as well as color and duplex ultrasound, were performed to evaluate the deep venous system(s) from the level of the common femoral vein through the popliteal and proximal calf veins. COMPARISON:  None Available. FINDINGS: VENOUS Nonocclusive DVT is observed in the right superficial  femoral vein midportion. There is also calf vein DVT in the anterior tibial and posterior tibial veins. The remaining venous structures are patent. Limited views of the contralateral common femoral vein are unremarkable. OTHER None. Limitations: none IMPRESSION: 1. Nonocclusive DVT is observed in the right superficial femoral vein midportion. There is also calf vein DVT in the anterior tibial and posterior tibial veins. Electronically Signed   By: Gaylyn Rong M.D.   On: 11/24/2022 11:34    Procedures Procedures    Medications Ordered in ED Medications  apixaban Everlene Balls) Education Kit for DVT/PE patients ( Does not apply Not Given 11/24/22 1448)  apixaban (ELIQUIS) tablet 10 mg (10 mg Oral Given 11/24/22 1448)    ED Course/ Medical Decision Making/ A&P                             Medical Decision Making Amount and/or Complexity of Data Reviewed External Data Reviewed: notes. Radiology: ordered and independent interpretation performed.    Details: +DVT No soft tissue mass on MRI  Risk Prescription drug management.   Patient's ultrasound does show DVT in the femoral vein and while is nonocclusive I think it is at least symptomatic and so we will prescribe Eliquis.  She has no chest pain or shortness of breath.  There is some swelling seen on the MRI but no obvious lipoma or mass which she was concerned about.  Either way, will start on Eliquis, have her stop aspirin, and follow-up with her PCP.  Will also have her follow-up with her orthopedist.  She does have a small joint effusion but I have low suspicion for septic joint.         Final Clinical Impression(s) / ED Diagnoses Final diagnoses:  Acute deep vein thrombosis (DVT) of femoral vein of right lower extremity    Rx / DC Orders ED Discharge Orders          Ordered    APIXABAN (ELIQUIS) VTE STARTER PACK (  AND )        11/24/22 1419              Pricilla Loveless, MD 11/24/22 1516

## 2022-11-24 NOTE — Discharge Instructions (Signed)

## 2022-12-03 ENCOUNTER — Telehealth: Payer: Self-pay

## 2022-12-03 NOTE — Telephone Encounter (Signed)
Patient called and scheduled appointment to start venom injection again. Last injection was on 10/07/2022 and received 0.10 ml of dose 0.03. Where do we need to start patient at again?   Please advice. Thanks

## 2022-12-06 NOTE — Telephone Encounter (Signed)
Please restart patient.   Strength 0.003 Dose 0.05 mL

## 2022-12-07 NOTE — Telephone Encounter (Signed)
Noted  

## 2022-12-07 NOTE — Telephone Encounter (Signed)
Forwarding message to injection room.

## 2022-12-08 ENCOUNTER — Encounter: Payer: Self-pay | Admitting: Neurology

## 2022-12-08 ENCOUNTER — Ambulatory Visit: Payer: PRIVATE HEALTH INSURANCE

## 2022-12-08 ENCOUNTER — Ambulatory Visit: Payer: PRIVATE HEALTH INSURANCE | Admitting: Neurology

## 2022-12-08 VITALS — BP 129/88 | HR 85 | Ht 65.0 in

## 2022-12-08 DIAGNOSIS — D171 Benign lipomatous neoplasm of skin and subcutaneous tissue of trunk: Secondary | ICD-10-CM | POA: Diagnosis not present

## 2022-12-08 DIAGNOSIS — R058 Other specified cough: Secondary | ICD-10-CM | POA: Diagnosis not present

## 2022-12-08 DIAGNOSIS — G4733 Obstructive sleep apnea (adult) (pediatric): Secondary | ICD-10-CM | POA: Diagnosis not present

## 2022-12-08 NOTE — Patient Instructions (Signed)
[facility] 's Disease:  Most patients are middle-aged women who are frequently mislabeled as experiencing other chronic pain disorders, like fibromyalgia (2).  Four morphologic types have been described based on the patterns of fat distribution: generalized diffuse, generalized nodular, localized nodular, and juxta-articular forms (3). There are no specific diagnostic laboratory tests for the diagnosis of Dercum disease, and the entity is usually diagnosed based on patient history and clinical examination findings.  A minimal basic criterion was proposed by [place] Desanctis al for the diagnosis of the disease, which consisted of generalized obesity along with chronically painful adipose tissue (>3 months). The cause of the disease is unknown, despite several proposed theories. Although most cases are sporadic, many studies have shown an autosomal dominant nature of inheritance with variable expression (3).  Few cases of Dercum disease have been reported in which breast pain was the primary presenting symptom. Trentin et al (4) published a report of Dercum disease in which the patient presented with multiple painful adipose tissue lumps in the breasts.  Dercum disease, also known as adiposis dolorosa, which translates simply into painful fat, is a rare subcutaneous adipose tissue or adipofacial disorder affecting the subcutaneous fat and associated fascial tissue. The disease is characterized by multiple painful lipomas, distributed mainly in the trunk and upper extremities (5).

## 2022-12-08 NOTE — Progress Notes (Signed)
Provider:  Melvyn Novas, MD  Primary Care Physician:  Cleatis Polka., MD [address] [place] [address]     Referring Provider: Cleatis Polka., Md [address] [place],  [place] [address]          Chief Complaint according to patient   Patient presents with:     New Patient (Initial Visit)           HISTORY OF PRESENT ILLNESS:  [name] [name] is a 57 y.o. female patient who is here for revisit 12/08/2022 for  CPAP compliance, but mentioned dyseasthesias, leg pain.   Chief concern according to patient :   " I had a rough 2 years, multiple death in the family", she just was diagnosed with a DVT and is now anticoagulated.  She has a herniated disc in her lumbar spine, broken left foot with a ruptured ligament. She continues to struggle with weight.  Multiple lipomas and many of them affecting peripheral nerves in close proximity to joints.  Here for CPAP compliance.  I see today the downloads - 30 days and 90 days of  CPAP machine, auto-titration, to gather the compliance is 50%.  The average daily usage is only 3 hours 36 minutes.  So she falls short of the hourly compliance as well.  The minimum pressure for both is set between 5 and 13 cm water pressure with 3 cm EPR and her residual AHI over a 90-day download was 3.0.  Air leakage between 14 and 16 L a minute, 95th percentile pressure is 9.8 cm water.      Seen by NP Drinkwater at Dr Alver Fisher office.  Seen by  Professor Isaac Laud, Dermatology [facility],  who diagnosed her Dercum's disease.   [name] [name] is a 57 y.o. female who presented to the ED on 11-25-2022: . 57 year old female presents with right leg swelling and pain. Has been having symptoms for around 4 weeks.  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  She thinks that was causing her symptoms today.  The pain is gotten so bad that is difficult for her to walk and very painful.  She  took some leftover hydrocodone.  Yesterday her leg was more swollen and red though the swelling has been cut in half and there is no longer any redness.  No fevers.  It is painful to bend her knee.  The primary focus of pain is her right calf as well as her right posterior medial thigh/knee.  She has a burning type pain as well.  No numbness. She is supposed to have an MRI that was ordered by her orthopedist (right thigh and right tib/fib) but there has been trouble getting this as an outpatient and her symptoms are worsening.   -09-2021: RV for OSA today, and reports she is looking for a new PCP. She is snoring with her mask on, still day time drowsy. Has had COVID 19 in September 2022 and  reports it took her long time to recover. She is suffering from weight gain, thickening skin- she depressed about this and feels rejected by her family.  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 Has back pain, which limited exercise. No pain medication is used, only flexaril.  She likes water aerobics. Compliance with CPAP was logged at 50%. Sleeping only 3 hours a night  Average use time  on days used 3 h 50 m.  Residual AHI 1.1/h. 95% pressure is 9 cm water. Uses a dream wear FFM. Under nose and full mouth covering.         2021, RV with CD.  "She spends 10 month with a machine who didn't work" from March 2020 through January- 2021- she refused a Development worker, community.  She is unhappy with [facility].  [name] [name] on 12/05/19 at 10:30 AM EDT. Daughter lives in [facility] [place] and may have just contracted Covid- , and son just got accepted to medical school.  Early on [name] has been fully vaccinated for against Covid, still have her family members husband and son, her daughter just received the first check. Daily fatigue severity scale was endorsed at 13 points, Epworth sleepiness score at five points, and the patient has reached a compliance over the last month of 63% CPAP use,  average use at time of CPAP is 3 hours and 55 minutes, minimum pressure is five maximum pressure is 13 with a 3 cm EPR level residual AHI is excellent between 1.4 and 2.2 per night of use CPAP 95th percentile is between 8.9 and 9.6 so well within the range of the current settings no central apneas seem to arise,           History of Present Illness: phone visit 11-2018.  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 Retro-orbital pressure.   The patient reports that her presumed diagnosis of Dupuytren's disease has progressed and has led to hair loss.  She saw a [facility] dermatology resident physician who prescribed finasteride and a topical minoxidil for her she was only asked to return in 6 months if it did not help".  I was under the impression that finasteride is not used in female patients.  At the same time she started finasteride she was also starting on CPAP and she reports that she developed tinnitus which is actually not uncommon as a side effect of finasteride.  14 days ago she stopped finasteride and yet her tinnitus has continued.  She also has nasal septal deviation her right nausea on is often occluded.  Her nose is stuffy every morning after using CPAP and she often wakes up with a terrible headache.  She has also had recently spikes and hypertension.  But she had not been using allergy medication for a while she is back on Xyzal and Singulair.  She has called Dr. Pollyann Kennedy, ENT and she is going to see his office soon.  The benefit of CPAP therapy has been that she is by far less tired and much more energetic and that her Epworth now is endorsed at 5 out of 24 points a significant decrease.  She no longer snores and she gets about 5 hours of sleep each night.  The download shows a 87% compliance with a residual  AHI of 2.5/h and a 90 percentile pressure of 7.2 cm.  Humidifier is set to 2 she is using a ramp time of 0 minutes.     Seen in a RV for 04-25-2018, I have the pleasure of seeing [name] [name] today who had become my patient originally when she was referred for sleep study.  Her sleep study took place on 15 May 2009 and at the time revealed an AHI of only 1.1  and during REM sleep of 9.2/h.  We repeated a sleep study upon request of her primary care physician 7 years later on 07 June 2016, she again had an AHI of only 1.2 and supposing been no respiratory event related arousals.  This PSG was an attended sleep study.  In supine sleep there was slightly more apnea noted during REM sleep there was an accentuation to 6.8/h.  She did not have oxygen desaturation of clinical significance and very few periodic limb movements they did not lead to arousals.   Now she presents with similar concerns but the added observation by her husband, Dr. Estell Harpin, of snoring that he had never noticed on her before.  She feels also that her throat has been tighter and that it has been difficult to swallow at times.  She continues to have an indurated and puffy appearing skin, and her physical appearance has changed.  Mrs. Hollett had researched her condition and had suspected that she may have been her Crohn's disease.  However specialist in that field have not seen her and she has been to some degree Ms. lead.  She made an appointment for the [facility] where she was told that only the main [place] branch does investigate this condition. She had an Discontinued hormone replacement therapy when she found information that it may make lipomas grow faster.  She slept much better while she was on hormone replacement therapy and I would like for her to resume.  To this day we do not have lipoma documented or found in any of our clinical tests but there is clearly the appearance of a thickened leathery skin, puffiness,  and a change in her facial features. She developed a buffalo hump. She is in pain.   I also would like to obtain an MRI of the brain with special attention to the pituitary ruling out acromegaly.         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 She has been seen in our sleep clinic before,and exactly 7 years ago underwent a sleep study that diagnosed no apnea, no PLM and no organic disease.  Her last sleep study took place on 05/15/2009 and revealed an AHI of 1.1 even in supine there was no accentuation but in rem sleep her AHI was 9.2. She did not have oxygen desaturations at the time and snoring was noted as being mild. Heart rate varied in normal range and remained in sinus rhythm. We discussed at the time to change her bedtime to an earlier time of day and to try Flexeril as helpful for muscle relaxation and sleep induction at a low dose nightly. That was helpful for years, but not any longer. Mrs Whiteaker's husband, an ED Physician, has not noted changes in her sleep, but he is de facto a shift Financial controller.  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 These painful nodes are manifesting like cellulite on extremities and abdomen, and she feels misunderstood.     Review of Systems: Out of a complete 14 system review, the patient complains of only the following symptoms, and all other reviewed systems are negative.:  Fatigue, sleepiness , snoring,  fragmented sleep, Insomnia, lipomata.  Dyseasthesia.    How likely are you to doze in the following situations: 0 = not likely, 1 = slight chance, 2 = moderate chance, 3 = high chance   Sitting and Reading? Watching Television? Sitting inactive in a public place (  theater or meeting)? As a passenger in a car for an hour without a break? Lying down in the afternoon when circumstances permit? Sitting and talking to someone? Sitting quietly after lunch without alcohol? In a car,  while stopped for a few minutes in traffic?   Total = 3/ 24 points   FSS endorsed at 16/ 63 points.   Headaches in AM when she uses CPAP without humidity.   Insomnia , wakes up at 4.30 AM, husband is a shift worker ED MD.   Social History   Socioeconomic History   Marital status: Married    Spouse name: Not on file   Number of children: Not on file   Years of education: Not on file   Highest education level: Not on file  Occupational History   Not on file  Tobacco Use   Smoking status: Never   Smokeless tobacco: Never  Vaping Use   Vaping Use: Never used  Substance and Sexual Activity   Alcohol use: No   Drug use: No   Sexual activity: Yes  Other Topics Concern   Not on file  Social History Narrative   Not on file   Social Determinants of Health   Financial Resource Strain: Not on file  Food Insecurity: Not on file  Transportation Needs: Not on file  Physical Activity: Not on file  Stress: Not on file  Social Connections: Not on file    Family History  Problem Relation Age of Onset   Hiatal hernia Mother    Hypertension Mother    Depression Mother    Allergic rhinitis Mother    Heart disease Father    Stroke Maternal Grandfather    Chronic Renal Failure Paternal Grandmother    Drug abuse Son    Allergic rhinitis Brother    Allergic rhinitis Maternal Aunt     Past Medical History:  Diagnosis Date   Anemia    many years ago   Anxiety    situational   Arthritis    "hands" (07/12/2012)   Asthma    seasonal    Chest pain    Chest pain at rest, on going 07/12/2012   Dercum's disease    Eczema    Family history of early CAD 07/12/2012   Fibromyalgia    GERD (gastroesophageal reflux disease)    Headache(784.0)    "often; not daily" (07/12/2012)   Hypertension    not on medications   Kidney stones    Migraine    Pneumonia    as a child   PONV (postoperative nausea and vomiting)    she states she gets very sick    Past Surgical History:   Procedure Laterality Date   breast lift     CARDIAC CATHETERIZATION  07/11/2012   CHOLECYSTECTOMY N/A 09/18/2015   Procedure: LAPAROSCOPIC CHOLECYSTECTOMY;  Surgeon: Abigail Miyamoto, MD;  Location: [facility] OR;  Service: General;  Laterality: N/A;   FOOT SURGERY  09/2019   toe surgery   LEFT HEART CATHETERIZATION WITH CORONARY ANGIOGRAM N/A 07/11/2012   Procedure: LEFT HEART CATHETERIZATION WITH CORONARY ANGIOGRAM;  Surgeon: Runell Gess, MD;  Location: [facility] CATH LAB;  Service: Cardiovascular;  Laterality: N/A;   REDUCTION MAMMAPLASTY Bilateral 10+ years ago   TONSILLECTOMY  ~ 1976   tubes and ovaries removed  2015   VAGINAL HYSTERECTOMY  ~ 2009     Current Outpatient Medications on File Prior to Visit  Medication Sig Dispense Refill   APIXABAN (ELIQUIS) VTE STARTER PACK (10MG   AND 5MG ) Take as directed on package: start with two-5mg  tablets twice daily for 7 days. On day 8, switch to one-5mg  tablet twice daily. 1 each 0   EPINEPHrine (EPIPEN 2-PAK) 0.3 mg/0.3 mL IJ SOAJ injection Inject 0.3 mg into the muscle as needed. 1 each 1   ezetimibe (ZETIA) 10 MG tablet Take 1 tablet (10 mg total) by mouth daily. 90 tablet 3   icosapent Ethyl (VASCEPA) 1 g capsule Take 2 capsules (2 g total) by mouth 2 (two) times daily. 360 capsule 0   ipratropium (ATROVENT) 0.03 % nasal spray Place 1-2 sprays into both nostrils 2 (two) times daily as needed (nasal drainage). 30 mL 5   ketoconazole (NIZORAL) 2 % cream Apply 1 Application topically daily. 60 g 2   ketoconazole (NIZORAL) 2 % shampoo Apply 1 Application topically 2 (two) times a week.     metroNIDAZOLE (METROGEL) 0.75 % gel metronidazole 0.75 % topical gel  APPLY ON THE SKIN DAILY FOR ROSACEA     olmesartan (BENICAR) 20 MG tablet Take 20 mg by mouth daily.     RABEprazole (ACIPHEX) 20 MG tablet Take 20 mg by mouth 2 (two) times daily.     rizatriptan (MAXALT-MLT) 10 MG disintegrating tablet Take 10 mg by mouth every 2 (two) hours as needed for  migraine.   11   rosuvastatin (CRESTOR) 5 MG tablet Take 1 tablet (5 mg total) by mouth daily. 90 tablet 3   Vitamin D, Ergocalciferol, (DRISDOL) 50000 units CAPS capsule Take 50,000 Units by mouth every Wednesday.      No current facility-administered medications on file prior to visit.    Allergies  Allergen Reactions   Augmentin [Amoxicillin-Pot Clavulanate] Anaphylaxis and Nausea And Vomiting   Betadine [Povidone Iodine]     Throat swelling, itchy lips, redness/swelling at application site.   Contrast Media [Iodinated Contrast Media] Anaphylaxis   Morphine And Related Anaphylaxis and Nausea And Vomiting   Prednisone Anaphylaxis   Yellow Jacket Venom [Bee Venom] Anaphylaxis   Azithromycin Other (See Comments)    SEVERE STOMACH PAIN    Erythromycin Nausea And Vomiting and Other (See Comments)    SEVERE STOMACH PAIN/abdominal pain    Iohexol Hives, Itching and Swelling    Swelling of upper lip, thick tongue, hives on face and back and itching all over   Oxycontin [Oxycodone] Nausea And Vomiting   Avelox [Moxifloxacin Hcl In Nacl] Swelling   Codeine Nausea And Vomiting and Other (See Comments)    Headaches also   Gadolinium Derivatives Hives, Itching and Swelling   Iodine    Lamisil [Terbinafine]     Pt stated, "It made me feel like I had the flu"   Levaquin [Levofloxacin In D5w] Swelling   Oxycodone-Acetaminophen Nausea And Vomiting   Pantoprazole Other (See Comments)    "Feels like I have the flu"   Praluent [Alirocumab]     Swelling in leg, pain, dizziness   Pravastatin     myalgias   Rosuvastatin     Myalgias and cramps, sore throat, indigestion   Shellfish Allergy    Dilaudid [Hydromorphone Hcl] Nausea And Vomiting   Oxycodone Hcl Nausea And Vomiting   Tramadol Nausea Only     DIAGNOSTIC DATA (LABS, IMAGING, TESTING) - I reviewed patient records, labs, notes, testing and imaging myself where available.  Lab Results  Component Value Date   WBC 6.5 04/14/2021    HGB 14.7 04/14/2021   HCT 44.0 04/14/2021   MCV 92.6 04/14/2021  PLT 271 04/14/2021      Component Value Date/Time   NA 141 04/14/2021 1920   NA 142 04/17/2020 1152   K 4.3 04/14/2021 1920   CL 106 04/14/2021 1920   CO2 23 04/14/2021 1920   GLUCOSE 86 04/14/2021 1920   BUN 12 04/14/2021 1920   BUN 13 04/17/2020 1152   CREATININE 0.70 04/14/2021 1920   CALCIUM 9.2 04/14/2021 1920   PROT 7.0 04/14/2021 1920   PROT 6.5 02/16/2021 1056   ALBUMIN 4.2 04/14/2021 1920   ALBUMIN 4.4 02/16/2021 1056   AST 19 04/14/2021 1920   ALT 17 04/14/2021 1920   ALKPHOS 57 04/14/2021 1920   BILITOT 0.4 04/14/2021 1920   BILITOT 0.3 02/16/2021 1056   GFRNONAA >60 04/14/2021 1920   GFRAA 89 04/17/2020 1152   Lab Results  Component Value Date   CHOL 220 (H) 12/03/2019   HDL 76 12/03/2019   LDLCALC 119 (H) 12/03/2019   TRIG 144 12/03/2019   CHOLHDL 2.9 12/03/2019   Lab Results  Component Value Date   HGBA1C 5.5 12/03/2019   No results found for: "VITAMINB12" No results found for: "TSH"  PHYSICAL EXAM:  Today's Vitals   12/08/22 0820  BP: 129/88  Pulse: 85  Height: 5\' 5"  (1.651 m)   Body mass index is 35.78 kg/m.   Wt Readings from Last 3 Encounters:  09/28/22 215 lb (97.5 kg)  11/02/21 210 lb (95.3 kg)  09/10/21 227 lb (103 kg)     Ht Readings from Last 3 Encounters:  12/08/22 5\' 5"  (1.651 m)  11/02/21 5\' 5"  (1.651 m)  09/10/21 5\' 5"  (1.651 m)      General: The patient is awake, alert and appears not in acute distress. The patient is well groomed. Head: Normocephalic, atraumatic. Neck is supple. Mallampati 3,  neck circumference: 13.5 !  . Nasal airflow congested , macroglossia borderline . Retrognathia. Cardiovascular:  Regular rate and rhythm , without  murmurs or carotid bruit, and without distended neck veins. Respiratory: Lungs are clear to auscultation. Skin:  With evidence of swelling on ankles, tenderness in back of the calf and knee. . Dimpled skin on  abdomen and hips.  Trunk: The patient's posture is erect    Neurologic exam : The patient is awake and alert, oriented to place and time.  Memory subjective described as intact. Attention span & concentration ability appears normal.  Speech is fluent,  without dysarthria, dysphonia or aphasia. Mood and affect are agitated and worried, and sad.    Cranial nerves: Pupils are equal and briskly reactive to light. Extraocular movements and without nystagmus. Visual fields by finger perimetry are intact.Hearing to finger rub intact. Facial sensation intact to fine touch. Facial motor strength is symmetric and tongue and uvula move midline. Shoulder shrug was symmetrical.    Motor exam:  Symmetric bulk, tone and ROM.    Normal tone without cog wheeling, symmetric grip strength .   Sensory:  Fine touch, pinprick and vibration were tested  and  normal.  Proprioception tested in the upper extremities was normal.   Coordination: Rapid alternating movements in the fingers/hands were of normal speed.  The Finger-to-nose maneuver was intact .  Gait and station: Patient could rise unassisted from a seated position,  has to brace herself, cane  for walking. Deep tendon reflexes: in the upper and lower extremities are symmetric and intact.      ASSESSMENT AND PLAN 57 y.o. year old female  here with:  1) OSA :  present and she is a spotty user of CPAP.  I encouraged CPAP use to  improve, please use the humidity, use allegra po and xyzal.    2) Dercum's diagnosis, she is waiting for a confirmatory soft tissue MRI. MRI  in dercum would show multiple small oblong fatty lesions in the subcutaneous tissues, with increased fluid signal intensity and associated blush-like contrast enhancement (6). The differential diagnoses of Dercum disease include other subcutaneous adipose tissue diseases and syndromes with hypertrophy of lipomatous tissue.  3) recent DVT.   I plan to follow up either personally or  through our NP within 12 months.   I would like to thank Cleatis Polka., MD for allowing me to meet with and to take care of this pleasant patient.   After spending a total time of  25  minutes face to face and additional time for physical and neurologic examination, review of laboratory studies,  personal review of imaging studies, reports and results of other testing and review of referral information / records as far as provided in visit,   Electronically signed by: Melvyn Novas, MD 12/08/2022 8:40 AM  [facility] Board certified by The [employer] of Sleep Medicine and Diplomate of the [employer] of Sleep Medicine. Board certified In Neurology through the ABPN, Fellow of the [employer] of Neu[employer]. Medical Director of [facility].

## 2022-12-10 ENCOUNTER — Telehealth: Payer: Self-pay

## 2022-12-10 NOTE — Telephone Encounter (Signed)
Courtesy call made to New pt. No answer & VM full

## 2022-12-13 ENCOUNTER — Other Ambulatory Visit: Payer: Self-pay

## 2022-12-13 ENCOUNTER — Encounter: Payer: Self-pay | Admitting: Oncology

## 2022-12-13 ENCOUNTER — Emergency Department (HOSPITAL_COMMUNITY): Payer: PRIVATE HEALTH INSURANCE

## 2022-12-13 ENCOUNTER — Encounter (HOSPITAL_COMMUNITY): Payer: Self-pay | Admitting: *Deleted

## 2022-12-13 ENCOUNTER — Inpatient Hospital Stay: Payer: PRIVATE HEALTH INSURANCE

## 2022-12-13 ENCOUNTER — Emergency Department (HOSPITAL_COMMUNITY)
Admission: EM | Admit: 2022-12-13 | Discharge: 2022-12-13 | Disposition: A | Discharge status: Home / Self Care | Payer: PRIVATE HEALTH INSURANCE | Attending: Emergency Medicine | Admitting: Emergency Medicine

## 2022-12-13 ENCOUNTER — Telehealth: Payer: Self-pay | Admitting: Oncology

## 2022-12-13 ENCOUNTER — Inpatient Hospital Stay: Payer: PRIVATE HEALTH INSURANCE | Attending: Oncology | Admitting: Oncology

## 2022-12-13 VITALS — BP 121/85 | HR 87 | Temp 96.9°F | Resp 18 | Wt 246.3 lb

## 2022-12-13 DIAGNOSIS — Z90710 Acquired absence of both cervix and uterus: Secondary | ICD-10-CM

## 2022-12-13 DIAGNOSIS — M7989 Other specified soft tissue disorders: Secondary | ICD-10-CM | POA: Insufficient documentation

## 2022-12-13 DIAGNOSIS — M25469 Effusion, unspecified knee: Secondary | ICD-10-CM

## 2022-12-13 DIAGNOSIS — Z883 Allergy status to other anti-infective agents status: Secondary | ICD-10-CM

## 2022-12-13 DIAGNOSIS — Z79899 Other long term (current) drug therapy: Secondary | ICD-10-CM | POA: Insufficient documentation

## 2022-12-13 DIAGNOSIS — Z880 Allergy status to penicillin: Secondary | ICD-10-CM

## 2022-12-13 DIAGNOSIS — Z9049 Acquired absence of other specified parts of digestive tract: Secondary | ICD-10-CM | POA: Insufficient documentation

## 2022-12-13 DIAGNOSIS — R0600 Dyspnea, unspecified: Secondary | ICD-10-CM

## 2022-12-13 DIAGNOSIS — I824Y1 Acute embolism and thrombosis of unspecified deep veins of right proximal lower extremity: Secondary | ICD-10-CM

## 2022-12-13 DIAGNOSIS — I10 Essential (primary) hypertension: Secondary | ICD-10-CM | POA: Insufficient documentation

## 2022-12-13 DIAGNOSIS — R079 Chest pain, unspecified: Secondary | ICD-10-CM | POA: Diagnosis not present

## 2022-12-13 DIAGNOSIS — M25561 Pain in right knee: Secondary | ICD-10-CM

## 2022-12-13 DIAGNOSIS — R0602 Shortness of breath: Secondary | ICD-10-CM

## 2022-12-13 DIAGNOSIS — Z7901 Long term (current) use of anticoagulants: Secondary | ICD-10-CM | POA: Insufficient documentation

## 2022-12-13 DIAGNOSIS — Z8379 Family history of other diseases of the digestive system: Secondary | ICD-10-CM | POA: Insufficient documentation

## 2022-12-13 DIAGNOSIS — Z841 Family history of disorders of kidney and ureter: Secondary | ICD-10-CM

## 2022-12-13 DIAGNOSIS — Z888 Allergy status to other drugs, medicaments and biological substances status: Secondary | ICD-10-CM

## 2022-12-13 DIAGNOSIS — M7120 Synovial cyst of popliteal space [Baker], unspecified knee: Secondary | ICD-10-CM

## 2022-12-13 DIAGNOSIS — Z881 Allergy status to other antibiotic agents status: Secondary | ICD-10-CM

## 2022-12-13 DIAGNOSIS — Z86718 Personal history of other venous thrombosis and embolism: Secondary | ICD-10-CM | POA: Insufficient documentation

## 2022-12-13 DIAGNOSIS — Z8249 Family history of ischemic heart disease and other diseases of the circulatory system: Secondary | ICD-10-CM | POA: Insufficient documentation

## 2022-12-13 DIAGNOSIS — Z811 Family history of alcohol abuse and dependence: Secondary | ICD-10-CM | POA: Insufficient documentation

## 2022-12-13 DIAGNOSIS — Z823 Family history of stroke: Secondary | ICD-10-CM

## 2022-12-13 DIAGNOSIS — Z90722 Acquired absence of ovaries, bilateral: Secondary | ICD-10-CM

## 2022-12-13 DIAGNOSIS — Z885 Allergy status to narcotic agent status: Secondary | ICD-10-CM | POA: Insufficient documentation

## 2022-12-13 DIAGNOSIS — Z818 Family history of other mental and behavioral disorders: Secondary | ICD-10-CM

## 2022-12-13 LAB — I-STAT CHEM 8, ED
BUN: 13 mg/dL (ref 6–20)
Calcium, Ion: 1.1 mmol/L — ABNORMAL LOW (ref 1.15–1.40)
Chloride: 106 mmol/L (ref 98–111)
Creatinine, Ser: 0.9 mg/dL (ref 0.44–1.00)
Glucose, Bld: 129 mg/dL — ABNORMAL HIGH (ref 70–99)
HCT: 38 % (ref 36.0–46.0)
Hemoglobin: 12.9 g/dL (ref 12.0–15.0)
Potassium: 3.9 mmol/L (ref 3.5–5.1)
Sodium: 138 mmol/L (ref 135–145)
TCO2: 24 mmol/L (ref 22–32)

## 2022-12-13 LAB — CBC
HCT: 40.6 % (ref 36.0–46.0)
Hemoglobin: 13.2 g/dL (ref 12.0–15.0)
MCH: 31.1 pg (ref 26.0–34.0)
MCHC: 32.5 g/dL (ref 30.0–36.0)
MCV: 95.8 fL (ref 80.0–100.0)
Platelets: 255 10*3/uL (ref 150–400)
RBC: 4.24 MIL/uL (ref 3.87–5.11)
RDW: 13.4 % (ref 11.5–15.5)
WBC: 7.9 10*3/uL (ref 4.0–10.5)
nRBC: 0 % (ref 0.0–0.2)

## 2022-12-13 MED ORDER — IOHEXOL 350 MG/ML SOLN
80.0000 mL | Freq: Once | INTRAVENOUS | Status: AC | PRN
  Administered 2022-12-13: 80 mL via INTRAVENOUS

## 2022-12-13 MED ORDER — METHYLPREDNISOLONE SODIUM SUCC 40 MG IJ SOLR
40.0000 mg | Freq: Once | INTRAMUSCULAR | Status: AC
  Administered 2022-12-13: 40 mg via INTRAVENOUS
  Filled 2022-12-13: qty 1

## 2022-12-13 MED ORDER — DIPHENHYDRAMINE HCL 50 MG/ML IJ SOLN
50.0000 mg | Freq: Once | INTRAMUSCULAR | Status: AC
  Administered 2022-12-13: 50 mg via INTRAVENOUS
  Filled 2022-12-13: qty 1

## 2022-12-13 MED ORDER — DIPHENHYDRAMINE HCL 25 MG PO CAPS: 50.0000 mg | ORAL_CAPSULE | Freq: Once | ORAL | Status: DC

## 2022-12-13 NOTE — ED Provider Notes (Signed)
[place] EMERGENCY DEPARTMENT AT [facility] Provider Note   CSN: [id number] Arrival date & time: 12/13/22  1605     History  Chief Complaint  Patient presents with   Shortness of Breath    [name] [name] is a 57 y.o. female.   Shortness of Breath    Pt has history of HTN, DVT, Fibromyalgia, OSA who presents for a ct angiogram.  Pt has history of recent dvt diagnosis.  Pt was diagnosed on 4/24/  Started on eliquis.  Pt has been having dyspnea on exertion.  She saw her oncologist today who recommended imaging for PE.  Initial plan was for outpatient contrast allergy protocol followed by outpatient CT scan.  VQ was a consideration as well.  Pt has a contrast allergy but has had ct scans with the contrast allergy protocol without difficulty.  Pt discussed with her husband, Dr Estell Harpin and she would like to proceed with CT imaging in the ED with contrast protocol.  Home Medications Prior to Admission medications   Medication Sig Start Date End Date Taking? Authorizing Provider  APIXABAN Everlene Balls) VTE STARTER PACK (10MG  AND 5MG ) Take as directed on package: start with two-5mg  tablets twice daily for 7 days. On day 8, switch to one-5mg  tablet twice daily. 11/24/22   Pricilla Loveless, MD  EPINEPHrine (EPIPEN 2-PAK) 0.3 mg/0.3 mL IJ SOAJ injection Inject 0.3 mg into the muscle as needed. 11/25/21   Ellamae Sia, DO  ezetimibe (ZETIA) 10 MG tablet Take 1 tablet (10 mg total) by mouth daily. 11/17/21   Jake Bathe, MD  icosapent Ethyl (VASCEPA) 1 g capsule Take 2 capsules (2 g total) by mouth 2 (two) times daily. 07/28/22   Jake Bathe, MD  ipratropium (ATROVENT) 0.03 % nasal spray Place 1-2 sprays into both nostrils 2 (two) times daily as needed (nasal drainage). 11/25/21   Ellamae Sia, DO  ketoconazole (NIZORAL) 2 % cream Apply 1 Application topically daily. 11/16/22   Louann Sjogren, DPM  ketoconazole (NIZORAL) 2 % shampoo Apply 1 Application topically 2 (two) times a  week. 04/11/19   [provider]  metroNIDAZOLE (METROGEL) 0.75 % gel metronidazole 0.75 % topical gel  APPLY ON THE SKIN DAILY FOR ROSACEA    [provider]  olmesartan (BENICAR) 20 MG tablet Take 20 mg by mouth daily. 11/03/18   [provider]  RABEprazole (ACIPHEX) 20 MG tablet Take 20 mg by mouth 2 (two) times daily.    [provider]  rizatriptan (MAXALT-MLT) 10 MG disintegrating tablet Take 10 mg by mouth every 2 (two) hours as needed for migraine.  06/18/15   [provider]  rosuvastatin (CRESTOR) 5 MG tablet Take 1 tablet (5 mg total) by mouth daily. 11/17/21   Jake Bathe, MD  Vitamin D, Ergocalciferol, (DRISDOL) 50000 units CAPS capsule Take 50,000 Units by mouth every Wednesday.     [provider]      Allergies    Augmentin [amoxicillin-pot clavulanate], Betadine [povidone iodine], Contrast media [iodinated contrast media], Morphine and related, Prednisone, Yellow jacket venom [bee venom], Azithromycin, Erythromycin, Iohexol, Oxycontin [oxycodone], Avelox [moxifloxacin hcl in nacl], Codeine, Gadolinium derivatives, Iodine, Lamisil [terbinafine], Levaquin [levofloxacin in d5w], Oxycodone-acetaminophen, Pantoprazole, Praluent [alirocumab], Pravastatin, Rosuvastatin, Shellfish allergy, Dilaudid [hydromorphone hcl], Oxycodone hcl, and Tramadol    Review of Systems   Review of Systems  Respiratory:  Positive for shortness of breath.     Physical Exam Updated Vital Signs BP 126/82 (BP Location: Right Arm)  Pulse 89   Temp 97.6 F (36.4 C) (Oral)   Resp 16   Ht 1.651 m (5\' 5" )   Wt 97.5 kg   SpO2 97%   BMI 35.78 kg/m  Physical Exam Vitals and nursing note reviewed.  Constitutional:      Appearance: She is well-developed. She is not ill-appearing or diaphoretic.  HENT:     Head: Normocephalic and atraumatic.     Right Ear: External ear normal.     Left Ear: External ear normal.  Eyes:     General: No scleral icterus.        Right eye: No discharge.        Left eye: No discharge.     Conjunctiva/sclera: Conjunctivae normal.  Neck:     Trachea: No tracheal deviation.  Cardiovascular:     Rate and Rhythm: Normal rate.  Pulmonary:     Effort: Pulmonary effort is normal. No respiratory distress.     Breath sounds: No stridor. No decreased breath sounds or wheezing.  Abdominal:     General: There is no distension.  Musculoskeletal:        General: No swelling or deformity.     Cervical back: Neck supple.  Skin:    General: Skin is warm and dry.     Findings: No rash.  Neurological:     Mental Status: She is alert. Mental status is at baseline.     Cranial Nerves: No dysarthria or facial asymmetry.     Motor: No seizure activity.     ED Results / Procedures / Treatments   Labs (all labs ordered are listed, but only abnormal results are displayed) Labs Reviewed  I-STAT CHEM 8, ED - Abnormal; Notable for the following components:      Result Value   Glucose, Bld 129 (*)    Calcium, Ion 1.10 (*)    All other components within normal limits  CBC    EKG None  Radiology CT Angio Chest PE W and/or Wo Contrast  Result Date: 12/13/2022 CLINICAL DATA:  Shortness of breath EXAM: CT ANGIOGRAPHY CHEST WITH CONTRAST TECHNIQUE: Multidetector CT imaging of the chest was performed using the standard protocol during bolus administration of intravenous contrast. Multiplanar CT image reconstructions and MIPs were obtained to evaluate the vascular anatomy. RADIATION DOSE REDUCTION: This exam was performed according to the departmental dose-optimization program which includes automated exposure control, adjustment of the mA and/or kV according to patient size and/or use of iterative reconstruction technique. CONTRAST:  80mL OMNIPAQUE IOHEXOL 350 MG/ML SOLN COMPARISON:  CT chest 12/29/2018 FINDINGS: Cardiovascular: Satisfactory opacification of the pulmonary arteries to the segmental level. No evidence of pulmonary  embolism. Normal heart size. No pericardial effusion. Nonaneurysmal aorta. No dissection. 5 mm right upper lobe Peri fissural nodule, series 12, image 56, probably stable on sagittal reformatted images compared with 2020, no imaging follow-up is recommended. Mediastinum/Nodes: No enlarged mediastinal, hilar, or axillary lymph nodes. Thyroid gland, trachea, and esophagus demonstrate no significant findings. Lungs/Pleura: Lungs are clear. No pleural effusion or pneumothorax. Upper Abdomen: No acute abnormality. Musculoskeletal: No chest wall abnormality. No acute or significant osseous findings. Review of the MIP images confirms the above findings. IMPRESSION: Negative. No CT evidence for acute pulmonary embolus or aortic dissection. Clear lung fields. Electronically Signed   By: Jasmine Pang M.D.   On: 12/13/2022 20:50    Procedures Procedures    Medications Ordered in ED Medications  methylPREDNISolone sodium succinate (SOLU-MEDROL) 40 mg/mL injection 40 mg (40  mg Intravenous Given 12/13/22 1642)  diphenhydrAMINE (BENADRYL) injection 50 mg (50 mg Intravenous Given 12/13/22 1952)  iohexol (OMNIPAQUE) 350 MG/ML injection 80 mL (80 mLs Intravenous Contrast Given 12/13/22 2034)    ED Course/ Medical Decision Making/ A&P Clinical Course as of 12/13/22 2105  Mon Dec 13, 2022  1742 CBC CBC normal. [JK]  1742 I-stat chem 8, ED (not at [facility], [facility] or [facility])(!) I-STAT unremarkable [JK]    Clinical Course User Index [JK] Linwood Dibbles, MD                             Medical Decision Making Problems Addressed: Dyspnea, unspecified type: acute illness or injury that poses a threat to life or bodily functions  Amount and/or Complexity of Data Reviewed Labs: ordered. Decision-making details documented in ED Course. Radiology: ordered and independent interpretation performed.  Risk Prescription drug management.   Patient presented to the ER for evaluation of possible pulmonary embolism.  Patient has  known history of recent DVT.  Pt has noticed some dyspnea.  She saw her oncologist and plan was for an outpatient imaging study because of her contrast allergy.  Pt presented to ED to proceed with expedited contrast allergy protocol.  Pt was pre treated per protocol with steroids and antihistamines.  Fortunately tolerated ct scan without reaction.  CT without PE.  No other acute abdnormality noted. Pt breathing easily, normal o2.  Stable for discharge.  Evaluation and diagnostic testing in the emergency department does not suggest an emergent condition requiring admission or immediate intervention beyond what has been performed at this time.  The patient is safe for discharge and has been instructed to return immediately for worsening symptoms, change in symptoms or any other concerns.         Final Clinical Impression(s) / ED Diagnoses Final diagnoses:  Dyspnea, unspecified type    Rx / DC Orders ED Discharge Orders     None         Linwood Dibbles, MD 12/13/22 2105

## 2022-12-13 NOTE — Telephone Encounter (Signed)
Wrap up- 12/13/22   VQ lung scan   3 months [place] right lower extremity a few days prior to MD, and lab,lab after MD .   Patient left without scheduling the scan. She states she will call back to schedule after she speaks to her husband.   Message sent to [facility] for scheduling of [place].    Will schedule follow up as soon as [place] is scheduled

## 2022-12-13 NOTE — Progress Notes (Signed)
Hematology/Oncology Consult note [phone] [phone]        REFERRING PROVIDER: Cleatis Polka., MD   CHIEF COMPLAINTS/REASON FOR VISIT:  Evaluation of acute DVT   ASSESSMENT & PLAN:   Acute deep vein thrombosis (DVT) of proximal vein of right lower extremity (HCC) Based on the clinical history, this is likely a provoked DVT due to immobilization from her long road trip Recommend anticoagulation with Eliquis 5 mg twice daily for 3 to 6 months. Repeat right lower extremity ultrasound in 3 months. CT chest angiogram was not done at the time of DVT diagnosis. She has been on anticoagulation for more than 2 weeks.  Occasional SOB/chest pain, mild and nonspecific. She understands that additional chest imaging are likely not going to change anticoagulation management plan. Shared decision was made to proceed with chest imaging for clarification, initially I recommend CT chest angiogram. She had anaphylactic reactions to IV contrast so I recommend to switch to V/Q scan.   Her PCP has ordered thrombophilia work up. Available results in the scanned documents showed  Antithrombin activity 148 slightly elevated.  Protein C 143 protein S 83 wnl.  I do not see Factor V leiden mutation and prothrombin mutation results. Will contact her PCP's office to see if these tests were done.  Other risk factors for thrombosis include obesity, questionable relationship of Dercum's disease and thrombosis. To date, there has been no recorded association with an increased risk of VTE but there have been reports of stroke-like events. It is unclear if these are caused by the condition itself or are co-incidental. Patient has plan to establish care with vascular surgery which I recommend her to keep appointments.   Orders Placed This Encounter  Procedures   US Venous Img Lower Unilateral Right    Standing Status:   Future    Standing Expiration Date:   12/13/2023    Order Specific  Question:   Reason for Exam (SYMPTOM  OR DIAGNOSIS REQUIRED)    Answer:   hx DVT    Order Specific Question:   Preferred imaging location?    Answer:   [facility]   NM Pulmonary Perfusion    Standing Status:   Future    Standing Expiration Date:   12/13/2023    Order Specific Question:   If indicated for the ordered procedure, I authorize the administration of a radiopharmaceutical per Radiology protocol    Answer:   Yes    Order Specific Question:   Is the patient pregnant?    Answer:   No    Order Specific Question:   Preferred imaging location?    Answer:   [facility]   DG Chest 2 View    Standing Status:   Future    Standing Expiration Date:   12/13/2023    Order Specific Question:   Reason for Exam (SYMPTOM  OR DIAGNOSIS REQUIRED)    Answer:   shortness of breath    Order Specific Question:   Is patient pregnant?    Answer:   No    Order Specific Question:   Preferred imaging location?    Answer:   [facility]   CBC with Differential/Platelet    Standing Status:   Future    Number of Occurrences:   1    Standing Expiration Date:   12/13/2023   Follow up in 3 months.  All questions were answered. The patient knows to call the clinic with any problems, questions or concerns.  Rickard Patience, MD, PhD [facility] Hematology Oncology 12/13/2022   HISTORY OF PRESENTING ILLNESS:   [name] [name] is a  57 y.o.  female with PMH listed below was seen in consultation at the request of  Cleatis Polka., MD  for evaluation of recent history of right lower extremity DVT.  - DVT developed in April 2024 after a long road trip (8-9 hours of driving per day over 3 days, with some breaks) - Had been experiencing pain on the side and behind the right knee for 3 weeks prior to the road trip, thought to be due to lipomas in that area, Pain was severe, felt like being hit with a crowbar, and radiated to the calf and thigh, - Leg became warm and swollen in the affected  area -11/24/2022, patient presented emergency room and 1. Nonocclusive DVT is observed in the right superficial femoral vein midportion. There is also calf vein DVT in the anterior tibial and posterior tibial veins.  Patient was started on Eliquis starting packet with 10 mg twice daily for 1 week followed by 5 mg twice daily. Patient was referred to establish care with hematology for further evaluation. Since the start of Eliquis, patient reports slight improvement of right lower extremity swelling and the pain. She has felt some shortness of breath and chest pain.  She feels that this could be secondary to the lipomas in the chest area. 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 She expresses frustration about lack of medical expertise and support for managing this rare condition  She is concerned about the possibility of lipomas entering the bloodstream based on her reading   MEDICAL HISTORY:  Past Medical History:  Diagnosis Date   Anemia    many years ago   Anxiety    situational   Arthritis    "hands" (07/12/2012)   Asthma    seasonal    Chest pain    Chest pain at rest, on going 07/12/2012   Dercum's disease    Eczema    Family history of early CAD 07/12/2012   Fibromyalgia    GERD (gastroesophageal reflux disease)    Headache(784.0)    "often; not daily" (07/12/2012)   Hypertension    not on medications   Kidney stones    Migraine    Pneumonia    as a child   PONV (postoperative nausea and vomiting)    she states she gets very sick    SURGICAL HISTORY: Past Surgical History:  Procedure Laterality Date   breast lift     CARDIAC CATHETERIZATION  07/11/2012   CHOLECYSTECTOMY N/A 09/18/2015   Procedure:  LAPAROSCOPIC CHOLECYSTECTOMY;  Surgeon: Abigail Miyamoto, MD;  Location: [facility] OR;  Service: General;  Laterality: N/A;   FOOT SURGERY  09/2019   toe surgery   LEFT HEART CATHETERIZATION WITH CORONARY ANGIOGRAM N/A 07/11/2012   Procedure: LEFT HEART CATHETERIZATION WITH CORONARY ANGIOGRAM;  Surgeon: Runell Gess, MD;  Location: [facility] CATH LAB;  Service: Cardiovascular;  Laterality: N/A;   REDUCTION MAMMAPLASTY Bilateral 10+ years ago   TONSILLECTOMY  ~ 1976   tubes and ovaries removed  2015   VAGINAL HYSTERECTOMY  ~ 2009    SOCIAL HISTORY: Social History   Socioeconomic History   Marital status: Married  Spouse name: Not on file   Number of children: Not on file   Years of education: Not on file   Highest education level: Not on file  Occupational History   Not on file  Tobacco Use   Smoking status: Never   Smokeless tobacco: Never  Vaping Use   Vaping Use: Never used  Substance and Sexual Activity   Alcohol use: No   Drug use: No   Sexual activity: Yes  Other Topics Concern   Not on file  Social History Narrative   Not on file   Social Determinants of Health   Financial Resource Strain: Not on file  Food Insecurity: No Food Insecurity (12/13/2022)   Hunger Vital Sign    Worried About Running Out of Food in the Last Year: Never true    Ran Out of Food in the Last Year: Never true  Transportation Needs: Not on file  Physical Activity: Not on file  Stress: Not on file  Social Connections: Not on file  Intimate Partner Violence: Not At Risk (12/13/2022)   Humiliation, Afraid, Rape, and Kick questionnaire    Fear of Current or Ex-Partner: No    Emotionally Abused: No    Physically Abused: No    Sexually Abused: No    FAMILY HISTORY: Family History  Problem Relation Age of Onset   Hiatal hernia Mother    Hypertension Mother    Depression Mother    Allergic rhinitis Mother    Heart disease Father    Stroke Maternal Grandfather    Chronic Renal Failure Paternal  Grandmother    Drug abuse Son    Allergic rhinitis Brother    Allergic rhinitis Maternal Aunt     ALLERGIES:  is allergic to augmentin [amoxicillin-pot clavulanate], betadine [povidone iodine], contrast media [iodinated contrast media], morphine and related, prednisone, yellow jacket venom [bee venom], azithromycin, erythromycin, iohexol, oxycontin [oxycodone], avelox [moxifloxacin hcl in nacl], codeine, gadolinium derivatives, iodine, lamisil [terbinafine], levaquin [levofloxacin in d5w], oxycodone-acetaminophen, pantoprazole, praluent [alirocumab], pravastatin, rosuvastatin, shellfish allergy, dilaudid [hydromorphone hcl], oxycodone hcl, and tramadol.  MEDICATIONS:  Current Outpatient Medications  Medication Sig Dispense Refill   APIXABAN (ELIQUIS) VTE STARTER PACK (10MG  AND 5MG ) Take as directed on package: start with two-5mg  tablets twice daily for 7 days. On day 8, switch to one-5mg  tablet twice daily. 1 each 0   EPINEPHrine (EPIPEN 2-PAK) 0.3 mg/0.3 mL IJ SOAJ injection Inject 0.3 mg into the muscle as needed. 1 each 1   ezetimibe (ZETIA) 10 MG tablet Take 1 tablet (10 mg total) by mouth daily. 90 tablet 3   icosapent Ethyl (VASCEPA) 1 g capsule Take 2 capsules (2 g total) by mouth 2 (two) times daily. 360 capsule 0   ipratropium (ATROVENT) 0.03 % nasal spray Place 1-2 sprays into both nostrils 2 (two) times daily as needed (nasal drainage). 30 mL 5   ketoconazole (NIZORAL) 2 % cream Apply 1 Application topically daily. 60 g 2   ketoconazole (NIZORAL) 2 % shampoo Apply 1 Application topically 2 (two) times a week.     metroNIDAZOLE (METROGEL) 0.75 % gel metronidazole 0.75 % topical gel  APPLY ON THE SKIN DAILY FOR ROSACEA     olmesartan (BENICAR) 20 MG tablet Take 20 mg by mouth daily.     RABEprazole (ACIPHEX) 20 MG tablet Take 20 mg by mouth 2 (two) times daily.     rizatriptan (MAXALT-MLT) 10 MG disintegrating tablet Take 10 mg by mouth every 2 (two) hours as needed for migraine.  11    rosuvastatin (CRESTOR) 5 MG tablet Take 1 tablet (5 mg total) by mouth daily. 90 tablet 3   Vitamin D, Ergocalciferol, (DRISDOL) 50000 units CAPS capsule Take 50,000 Units by mouth every Wednesday.      No current facility-administered medications for this visit.    Review of Systems  Constitutional:  Negative for appetite change, chills, fatigue and fever.  HENT:   Negative for hearing loss and voice change.   Eyes:  Negative for eye problems.  Respiratory:  Positive for shortness of breath. Negative for chest tightness and cough.   Cardiovascular:  Positive for chest pain and leg swelling.  Gastrointestinal:  Negative for abdominal distention, abdominal pain and blood in stool.  Endocrine: Negative for hot flashes.  Genitourinary:  Negative for difficulty urinating and frequency.   Musculoskeletal:  Negative for arthralgias.  Skin:  Negative for itching and rash.  Neurological:  Negative for extremity weakness.  Hematological:  Negative for adenopathy.  Psychiatric/Behavioral:  Negative for confusion.    PHYSICAL EXAMINATION: ECOG PERFORMANCE STATUS: 1 - Symptomatic but completely ambulatory Vitals:   12/13/22 0950  BP: 121/85  Pulse: 87  Resp: 18  Temp: (!) 96.9 F (36.1 C)  SpO2: 100%   Filed Weights   12/13/22 0950  Weight: 246 lb 4.8 oz (111.7 kg)    Physical Exam Constitutional:      General: She is not in acute distress. HENT:     Head: Normocephalic and atraumatic.  Eyes:     General: No scleral icterus. Cardiovascular:     Rate and Rhythm: Normal rate and regular rhythm.     Heart sounds: Normal heart sounds.  Pulmonary:     Effort: Pulmonary effort is normal. No respiratory distress.     Breath sounds: No wheezing.  Abdominal:     General: Bowel sounds are normal. There is no distension.     Palpations: Abdomen is soft.  Musculoskeletal:        General: No deformity. Normal range of motion.     Cervical back: Normal range of motion and neck supple.   Skin:    General: Skin is warm and dry.     Findings: No erythema or rash.  Neurological:     Mental Status: She is alert and oriented to person, place, and time. Mental status is at baseline.     Cranial Nerves: No cranial nerve deficit.     LABORATORY DATA:  I have reviewed the data as listed    Latest Ref Rng & Units 04/14/2021    7:20 PM 03/13/2021    2:38 PM 12/05/2020   12:04 PM  CBC  WBC 4.0 - 10.5 K/uL 6.5  7.6  8.0   Hemoglobin 12.0 - 15.0 g/dL 09.8  11.9  14.7   Hematocrit 36.0 - 46.0 % 44.0  40.5  43.9   Platelets 150 - 400 K/uL 271  227  275       Latest Ref Rng & Units 04/14/2021    7:20 PM 03/13/2021    2:38 PM 02/16/2021   10:56 AM  CMP  Glucose 70 - 99 mg/dL 86  829    BUN 6 - 20 mg/dL 12  13    Creatinine 5.62 - 1.00 mg/dL 1.30  8.65    Sodium 784 - 145 mmol/L 141  141    Potassium 3.5 - 5.1 mmol/L 4.3  4.4    Chloride 98 - 111 mmol/L 106  108    CO2  22 - 32 mmol/L 23  25    Calcium 8.9 - 10.3 mg/dL 9.2  8.7    Total Protein 6.5 - 8.1 g/dL 7.0  6.7  6.5   Total Bilirubin 0.3 - 1.2 mg/dL 0.4  0.8  0.3   Alkaline Phos 38 - 126 U/L 57  53  68   AST 15 - 41 U/L 19  21  10    ALT 0 - 44 U/L 17  13  13        RADIOGRAPHIC STUDIES: I have personally reviewed the radiological images as listed and agreed with the findings in the report. MR TIBIA FIBULA RIGHT WO CONTRAST  Result Date: 11/24/2022 CLINICAL DATA:  Soft tissue mass, lower leg, deep. EXAM: MRI OF LOWER RIGHT EXTREMITY WITHOUT CONTRAST TECHNIQUE: Multiplanar, multisequence MR imaging of the right lower extremity was performed. No intravenous contrast was administered. COMPARISON:  None Available. FINDINGS: Bones/Joint/Cartilage Marrow signal is within normal limits. No evidence of fracture or osteonecrosis. Small knee joint effusion. Ligaments Intact. Muscles and Tendons Muscles are normal in bulk. Tendons of the flexor, extensor compartments are intact. No intramuscular mass. Soft tissues Subcutaneous soft  tissue edema about the anterior, lateral and medial aspect of the lower leg. No fluid collection or hematoma. IMPRESSION: 1. Subcutaneous soft tissue edema about the anterior, lateral and medial aspect of the lower leg without fluid collection or hematoma. 2. No evidence of fracture or osteonecrosis. 3. Muscles and tendons are intact. 4. Small knee joint effusion. 5. No evidence of soft tissue mass. Electronically Signed   By: Larose Hires D.O.   On: 11/24/2022 14:32   MR [id number] RIGHT WO CONTRAST  Result Date: 11/24/2022 CLINICAL DATA:  Soft tissue mass, thigh, deep. Soft tissue masses throughout the right upper and lower leg. EXAM: MRI OF THE RIGHT FEMUR WITHOUT CONTRAST TECHNIQUE: Multiplanar, multisequence MR imaging of the right femur was performed. No intravenous contrast was administered. COMPARISON:  None Available. FINDINGS: Bones/Joint/Cartilage Marrow signal is within normal limits. Mild arthritic changes of the right hip joint as well as right knee joint. Small knee joint effusion. Ligaments Intact. Muscles and Tendons Muscles are normal in bulk. No intramuscular hematoma or appreciable muscular mass. Small Baker's cyst. Soft tissues Skin and subcutaneous soft tissues are within normal limits. No appreciable soft tissue mass. Subcutaneous soft tissue edema about the anteromedial aspect of the knee. IMPRESSION: 1. No evidence of soft tissue mass or fluid collection. 2. Subcutaneous soft tissue edema about the anteromedial aspect of the knee. 3. Mild arthritic changes of the right hip joint as well as right knee joint. Small knee joint effusion. 4. Small Baker's cyst. Electronically Signed   By: Larose Hires D.O.   On: 11/24/2022 14:29   US Venous Img Lower Unilateral Right  Result Date: 11/24/2022 CLINICAL DATA:  Right lower extremity pain, erythema, and swelling for 1 month EXAM: RIGHT LOWER EXTREMITY VENOUS DOPPLER ULTRASOUND TECHNIQUE: Gray-scale sonography with compression, as well as color and  duplex ultrasound, were performed to evaluate the deep venous system(s) from the level of the common femoral vein through the popliteal and proximal calf veins. COMPARISON:  None Available. FINDINGS: VENOUS Nonocclusive DVT is observed in the right superficial femoral vein midportion. There is also calf vein DVT in the anterior tibial and posterior tibial veins. The remaining venous structures are patent. Limited views of the contralateral common femoral vein are unremarkable. OTHER None. Limitations: none IMPRESSION: 1. Nonocclusive DVT is observed in the right superficial femoral vein midportion.  There is also calf vein DVT in the anterior tibial and posterior tibial veins. Electronically Signed   By: Gaylyn Rong M.D.   On: 11/24/2022 11:34

## 2022-12-13 NOTE — Discharge Instructions (Signed)
The CT scan fortunately did not show any signs of pulmonary embolism.  Continue your Eliquis treatment for your DVT.  Follow-up with your doctors as planned.

## 2022-12-13 NOTE — Assessment & Plan Note (Addendum)
Based on the clinical history, this is likely a provoked DVT due to immobilization from her long road trip Recommend anticoagulation with Eliquis 5 mg twice daily for 3 to 6 months. Repeat right lower extremity ultrasound in 3 months. CT chest angiogram was not done at the time of DVT diagnosis. She has been on anticoagulation for more than 2 weeks.  Occasional SOB/chest pain, mild and nonspecific. She understands that additional chest imaging are likely not going to change anticoagulation management plan. Shared decision was made to proceed with chest imaging for clarification, initially I recommend CT chest angiogram. She had anaphylactic reactions to IV contrast so I recommend to switch to V/Q scan.   Her PCP has ordered thrombophilia work up. Available results in the scanned documents showed  Antithrombin activity 148 slightly elevated.  Protein C 143 protein S 83 wnl.  I do not see Factor V leiden mutation and prothrombin mutation results. Will contact her PCP's office to see if these tests were done.  Other risk factors for thrombosis include obesity, questionable relationship of Dercum's disease and thrombosis. To date, there has been no recorded association with an increased risk of VTE but there have been reports of stroke-like events. It is unclear if these are caused by the condition itself or are co-incidental. Patient has plan to establish care with vascular surgery which I recommend her to keep appointments.

## 2022-12-13 NOTE — ED Triage Notes (Signed)
SHOB x 3 days. Pt has history of of DVT's. Swollen ankles. DOE.

## 2022-12-14 ENCOUNTER — Other Ambulatory Visit: Payer: Self-pay | Admitting: Neurology

## 2022-12-14 ENCOUNTER — Telehealth: Payer: Self-pay | Admitting: *Deleted

## 2022-12-14 ENCOUNTER — Other Ambulatory Visit: Payer: Self-pay

## 2022-12-14 DIAGNOSIS — G4733 Obstructive sleep apnea (adult) (pediatric): Secondary | ICD-10-CM

## 2022-12-14 DIAGNOSIS — I824Y1 Acute embolism and thrombosis of unspecified deep veins of right proximal lower extremity: Secondary | ICD-10-CM

## 2022-12-14 NOTE — Telephone Encounter (Addendum)
We are still waiting for lab report from PCP to decide if she needs labs.   [place] appt pending, will schedule follow up once we know date.   Per Dr. Cathie Hoops: no strenuous activity for 2 more weeks, walking is ok (4 weeks total- starting from original dx date). She discussed with patient at visit.   Will fax last OVN and report to PCP.

## 2022-12-14 NOTE — Telephone Encounter (Addendum)
Attempted to call patient, it went to voice mail and her mailbox is full so I could not leave a message. Patient called back and I informed her of response from Dr Cathie Hoops. She wants Dr Cathie Hoops to know that she has vascular surgery appointment 5/30

## 2022-12-14 NOTE — Telephone Encounter (Signed)
Patient called reporting that she had the CT Angio last night after having IV pre medications in ER (had no problems) and it was negative She is wanting Dr Cathie Hoops to [facility] forward" and also wants her records sent to her PCP Dr Clelia Croft in [place] and know if she can proceed with Physical Therapy that she goes to twice a week

## 2022-12-14 NOTE — Telephone Encounter (Signed)
Looks like pt had CT chest angio at ER, so we will hold off on VQ scan.

## 2022-12-15 NOTE — Telephone Encounter (Signed)
Looks like pt is scheduled for [place] in August, Plese schedule MD a few days after [place] and labs a few days prior to MD (can be same day as [place]). Please inform pt of appt details.

## 2022-12-16 ENCOUNTER — Ambulatory Visit: Payer: PRIVATE HEALTH INSURANCE

## 2022-12-16 ENCOUNTER — Observation Stay (HOSPITAL_COMMUNITY)
Admission: EM | Admit: 2022-12-16 | Discharge: 2022-12-17 | Disposition: A | Discharge status: Home / Self Care | Payer: PRIVATE HEALTH INSURANCE | Attending: Internal Medicine | Admitting: Internal Medicine

## 2022-12-16 ENCOUNTER — Encounter (HOSPITAL_COMMUNITY): Payer: Self-pay | Admitting: Emergency Medicine

## 2022-12-16 ENCOUNTER — Emergency Department (HOSPITAL_COMMUNITY): Payer: PRIVATE HEALTH INSURANCE

## 2022-12-16 ENCOUNTER — Other Ambulatory Visit: Payer: Self-pay

## 2022-12-16 DIAGNOSIS — G4733 Obstructive sleep apnea (adult) (pediatric): Secondary | ICD-10-CM

## 2022-12-16 DIAGNOSIS — E876 Hypokalemia: Secondary | ICD-10-CM | POA: Insufficient documentation

## 2022-12-16 DIAGNOSIS — I10 Essential (primary) hypertension: Secondary | ICD-10-CM | POA: Diagnosis not present

## 2022-12-16 DIAGNOSIS — E669 Obesity, unspecified: Secondary | ICD-10-CM | POA: Insufficient documentation

## 2022-12-16 DIAGNOSIS — Z7901 Long term (current) use of anticoagulants: Secondary | ICD-10-CM | POA: Diagnosis not present

## 2022-12-16 DIAGNOSIS — E785 Hyperlipidemia, unspecified: Secondary | ICD-10-CM | POA: Diagnosis present

## 2022-12-16 DIAGNOSIS — R112 Nausea with vomiting, unspecified: Principal | ICD-10-CM | POA: Diagnosis present

## 2022-12-16 DIAGNOSIS — J309 Allergic rhinitis, unspecified: Secondary | ICD-10-CM | POA: Diagnosis not present

## 2022-12-16 DIAGNOSIS — J3089 Other allergic rhinitis: Secondary | ICD-10-CM | POA: Diagnosis present

## 2022-12-16 DIAGNOSIS — Z6838 Body mass index (BMI) 38.0-38.9, adult: Secondary | ICD-10-CM | POA: Insufficient documentation

## 2022-12-16 DIAGNOSIS — I824Y1 Acute embolism and thrombosis of unspecified deep veins of right proximal lower extremity: Secondary | ICD-10-CM | POA: Diagnosis present

## 2022-12-16 DIAGNOSIS — Z86718 Personal history of other venous thrombosis and embolism: Secondary | ICD-10-CM | POA: Diagnosis not present

## 2022-12-16 DIAGNOSIS — Z8669 Personal history of other diseases of the nervous system and sense organs: Secondary | ICD-10-CM

## 2022-12-16 LAB — CBC WITH DIFFERENTIAL/PLATELET
Abs Immature Granulocytes: 0.04 10*3/uL (ref 0.00–0.07)
Basophils Absolute: 0 10*3/uL (ref 0.0–0.1)
Basophils Relative: 0 %
Eosinophils Absolute: 0 10*3/uL (ref 0.0–0.5)
Eosinophils Relative: 1 %
HCT: 40.9 % (ref 36.0–46.0)
Hemoglobin: 13.2 g/dL (ref 12.0–15.0)
Immature Granulocytes: 1 %
Lymphocytes Relative: 19 %
Lymphs Abs: 1.4 10*3/uL (ref 0.7–4.0)
MCH: 30.8 pg (ref 26.0–34.0)
MCHC: 32.3 g/dL (ref 30.0–36.0)
MCV: 95.3 fL (ref 80.0–100.0)
Monocytes Absolute: 0.6 10*3/uL (ref 0.1–1.0)
Monocytes Relative: 8 %
Neutro Abs: 5.3 10*3/uL (ref 1.7–7.7)
Neutrophils Relative %: 71 %
Platelets: 205 10*3/uL (ref 150–400)
RBC: 4.29 MIL/uL (ref 3.87–5.11)
RDW: 13.7 % (ref 11.5–15.5)
WBC: 7.3 10*3/uL (ref 4.0–10.5)
nRBC: 0 % (ref 0.0–0.2)

## 2022-12-16 LAB — COMPREHENSIVE METABOLIC PANEL
ALT: 27 U/L (ref 0–44)
AST: 33 U/L (ref 15–41)
Albumin: 3.3 g/dL — ABNORMAL LOW (ref 3.5–5.0)
Alkaline Phosphatase: 62 U/L (ref 38–126)
Anion gap: 11 (ref 5–15)
BUN: 12 mg/dL (ref 6–20)
CO2: 22 mmol/L (ref 22–32)
Calcium: 7.8 mg/dL — ABNORMAL LOW (ref 8.9–10.3)
Chloride: 103 mmol/L (ref 98–111)
Creatinine, Ser: 0.87 mg/dL (ref 0.44–1.00)
GFR, Estimated: >60 mL/min (ref >60)
Glucose, Bld: 142 mg/dL — ABNORMAL HIGH (ref 70–99)
Potassium: 3.4 mmol/L — ABNORMAL LOW (ref 3.5–5.1)
Sodium: 136 mmol/L (ref 135–145)
Total Bilirubin: 0.8 mg/dL (ref 0.3–1.2)
Total Protein: 6.6 g/dL (ref 6.5–8.1)

## 2022-12-16 LAB — LIPASE, BLOOD: Lipase: 24 U/L (ref 11–51)

## 2022-12-16 LAB — HEMOGLOBIN A1C
Hgb A1c MFr Bld: 5.8 % — ABNORMAL HIGH (ref 4.8–5.6)
Mean Plasma Glucose: 119.76 mg/dL

## 2022-12-16 LAB — HIV ANTIBODY (ROUTINE TESTING W REFLEX): HIV Screen 4th Generation wRfx: NONREACTIVE

## 2022-12-16 MED ORDER — FAMOTIDINE IN NACL 20-0.9 MG/50ML-% IV SOLN
20.0000 mg | Freq: Once | INTRAVENOUS | Status: AC
  Administered 2022-12-16: 20 mg via INTRAVENOUS
  Filled 2022-12-16: qty 50

## 2022-12-16 MED ORDER — KETOROLAC TROMETHAMINE 15 MG/ML IJ SOLN
15.0000 mg | Freq: Once | INTRAMUSCULAR | Status: AC
  Administered 2022-12-16: 15 mg via INTRAVENOUS
  Filled 2022-12-16: qty 1

## 2022-12-16 MED ORDER — KETOCONAZOLE 2 % EX CREA
1.0000 | TOPICAL_CREAM | Freq: Every day | CUTANEOUS | Status: DC
  Administered 2022-12-17: 1 via TOPICAL
  Filled 2022-12-16: qty 15

## 2022-12-16 MED ORDER — SODIUM CHLORIDE 0.9 % IV BOLUS
1000.0000 mL | Freq: Once | INTRAVENOUS | Status: AC
  Administered 2022-12-16: 1000 mL via INTRAVENOUS

## 2022-12-16 MED ORDER — LACTATED RINGERS IV BOLUS
1000.0000 mL | Freq: Once | INTRAVENOUS | Status: AC
  Administered 2022-12-16: 1000 mL via INTRAVENOUS

## 2022-12-16 MED ORDER — PROMETHAZINE HCL 25 MG/ML IJ SOLN
INTRAMUSCULAR | Status: AC
  Filled 2022-12-16: qty 1

## 2022-12-16 MED ORDER — KETOROLAC TROMETHAMINE 30 MG/ML IJ SOLN
30.0000 mg | Freq: Four times a day (QID) | INTRAMUSCULAR | Status: DC | PRN
  Administered 2022-12-16 – 2022-12-17 (×4): 30 mg via INTRAVENOUS
  Filled 2022-12-16 (×6): qty 1

## 2022-12-16 MED ORDER — POTASSIUM CHLORIDE 10 MEQ/100ML IV SOLN
10.0000 meq | Freq: Once | INTRAVENOUS | Status: AC
  Administered 2022-12-16: 10 meq via INTRAVENOUS

## 2022-12-16 MED ORDER — LACTATED RINGERS IV SOLN: INTRAVENOUS | Status: AC

## 2022-12-16 MED ORDER — APIXABAN 5 MG PO TABS
5.0000 mg | ORAL_TABLET | Freq: Once | ORAL | Status: AC
  Administered 2022-12-16: 5 mg via ORAL
  Filled 2022-12-16: qty 1

## 2022-12-16 MED ORDER — POTASSIUM CHLORIDE 10 MEQ/100ML IV SOLN
10.0000 meq | INTRAVENOUS | Status: DC
  Administered 2022-12-16 (×3): 10 meq via INTRAVENOUS
  Filled 2022-12-16 (×4): qty 100

## 2022-12-16 MED ORDER — SODIUM CHLORIDE 0.9 % IV SOLN
25.0000 mg | Freq: Four times a day (QID) | INTRAVENOUS | Status: DC | PRN
  Administered 2022-12-16 – 2022-12-17 (×3): 25 mg via INTRAVENOUS
  Filled 2022-12-16 (×2): qty 1

## 2022-12-16 MED ORDER — POTASSIUM CHLORIDE 10 MEQ/100ML IV SOLN
10.0000 meq | Freq: Once | INTRAVENOUS | Status: AC
  Administered 2022-12-16: 10 meq via INTRAVENOUS
  Filled 2022-12-16: qty 100

## 2022-12-16 MED ORDER — HYDRALAZINE HCL 20 MG/ML IJ SOLN: 10.0000 mg | INTRAMUSCULAR | Status: DC | PRN

## 2022-12-16 MED ORDER — IRBESARTAN 150 MG PO TABS
150.0000 mg | ORAL_TABLET | Freq: Every day | ORAL | Status: DC
  Filled 2022-12-16: qty 1

## 2022-12-16 MED ORDER — METRONIDAZOLE 0.75 % EX GEL: Freq: Every day | CUTANEOUS | Status: DC

## 2022-12-16 MED ORDER — ONDANSETRON HCL 4 MG/2ML IJ SOLN
4.0000 mg | Freq: Once | INTRAMUSCULAR | Status: AC
  Administered 2022-12-16: 4 mg via INTRAVENOUS
  Filled 2022-12-16: qty 2

## 2022-12-16 MED ORDER — CALCIUM GLUCONATE-NACL 1-0.675 GM/50ML-% IV SOLN
1.0000 g | Freq: Once | INTRAVENOUS | Status: AC
  Administered 2022-12-16: 1000 mg via INTRAVENOUS
  Filled 2022-12-16: qty 50

## 2022-12-16 MED ORDER — PROMETHAZINE HCL 25 MG RE SUPP
25.0000 mg | Freq: Once | RECTAL | Status: AC
  Administered 2022-12-16: 25 mg via RECTAL
  Filled 2022-12-16: qty 1

## 2022-12-16 MED ORDER — SUMATRIPTAN SUCCINATE 6 MG/0.5ML ~~LOC~~ SOLN
6.0000 mg | SUBCUTANEOUS | Status: DC | PRN
  Filled 2022-12-16: qty 0.5

## 2022-12-16 MED ORDER — APIXABAN 5 MG PO TABS
5.0000 mg | ORAL_TABLET | Freq: Two times a day (BID) | ORAL | Status: DC
  Administered 2022-12-16 – 2022-12-17 (×2): 5 mg via ORAL
  Filled 2022-12-16 (×2): qty 1

## 2022-12-16 MED ORDER — IPRATROPIUM BROMIDE 0.03 % NA SOLN: 1.0000 | Freq: Two times a day (BID) | NASAL | Status: DC | PRN

## 2022-12-16 MED ORDER — ENOXAPARIN SODIUM 100 MG/ML IJ SOSY: 100.0000 mg | PREFILLED_SYRINGE | Freq: Two times a day (BID) | INTRAMUSCULAR | Status: DC

## 2022-12-16 MED ORDER — RIZATRIPTAN BENZOATE 10 MG PO TBDP: 10.0000 mg | ORAL_TABLET | ORAL | Status: DC | PRN

## 2022-12-16 NOTE — ED Provider Notes (Signed)
[place] EMERGENCY DEPARTMENT AT [facility] Provider Note   CSN: [id number] Arrival date & time: 12/16/22  4098     History  Chief Complaint  Patient presents with   Emesis    [name] [name] is a 57 y.o. female.  The history is provided by the patient and the spouse.  Patient w/ history of hypertension, recent DVT diagnosis on anticoagulation presents with nausea vomiting diarrhea for approximately 3 days.  She denies any hematemesis, no dark or bloody stools.  Patient is reporting headache and feeling lightheaded now due to multiple episodes of vomiting diarrhea.  She has been unable to keep any fluids down at home.  She did have some abdominal pain at home, but this is improving.  Only previous abdominal surgery was hysterectomy.  No recent foreign travel.  She reports possible sick contacts    Past Medical History:  Diagnosis Date   Anemia    many years ago   Anxiety    situational   Arthritis    "hands" (07/12/2012)   Asthma    seasonal    Chest pain    Chest pain at rest, on going 07/12/2012   Dercum's disease    Eczema    Family history of early CAD 07/12/2012   Fibromyalgia    GERD (gastroesophageal reflux disease)    Headache(784.0)    "often; not daily" (07/12/2012)   Hypertension    not on medications   Kidney stones    Migraine    Pneumonia    as a child   PONV (postoperative nausea and vomiting)    she states she gets very sick    Home Medications Prior to Admission medications   Medication Sig Start Date End Date Taking? Authorizing Provider  APIXABAN (ELIQUIS) VTE STARTER PACK (10MG  AND 5MG ) Take as directed on package: start with two-5mg  tablets twice daily for 7 days. On day 8, switch to one-5mg  tablet twice daily. 11/24/22   Pricilla Loveless, MD  EPINEPHrine (EPIPEN 2-PAK) 0.3 mg/0.3 mL IJ SOAJ injection Inject 0.3 mg into the muscle as needed. 11/25/21   Ellamae Sia, DO  ezetimibe (ZETIA) 10 MG tablet Take 1 tablet (10 mg  total) by mouth daily. 11/17/21   Jake Bathe, MD  icosapent Ethyl (VASCEPA) 1 g capsule Take 2 capsules (2 g total) by mouth 2 (two) times daily. 07/28/22   Jake Bathe, MD  ipratropium (ATROVENT) 0.03 % nasal spray Place 1-2 sprays into both nostrils 2 (two) times daily as needed (nasal drainage). 11/25/21   Ellamae Sia, DO  ketoconazole (NIZORAL) 2 % cream Apply 1 Application topically daily. 11/16/22   Louann Sjogren, DPM  ketoconazole (NIZORAL) 2 % shampoo Apply 1 Application topically 2 (two) times a week. 04/11/19   [provider]  metroNIDAZOLE (METROGEL) 0.75 % gel metronidazole 0.75 % topical gel  APPLY ON THE SKIN DAILY FOR ROSACEA    [provider]  olmesartan (BENICAR) 20 MG tablet Take 20 mg by mouth daily. 11/03/18   [provider]  RABEprazole (ACIPHEX) 20 MG tablet Take 20 mg by mouth 2 (two) times daily.    [provider]  rizatriptan (MAXALT-MLT) 10 MG disintegrating tablet Take 10 mg by mouth every 2 (two) hours as needed for migraine.  06/18/15   [provider]  rosuvastatin (CRESTOR) 5 MG tablet Take 1 tablet (5 mg total) by mouth daily. 11/17/21   Jake Bathe, MD  Vitamin D, Ergocalciferol, (DRISDOL) 50000  units CAPS capsule Take 50,000 Units by mouth every Wednesday.     [provider]      Allergies    Augmentin [amoxicillin-pot clavulanate], Betadine [povidone iodine], Contrast media [iodinated contrast media], Morphine and codeine, Prednisone, Yellow jacket venom [bee venom], Azithromycin, Erythromycin, Iohexol, Oxycontin [oxycodone], Avelox [moxifloxacin hcl in nacl], Codeine, Gadolinium derivatives, Iodine, Lamisil [terbinafine], Levaquin [levofloxacin in d5w], Oxycodone-acetaminophen, Pantoprazole, Praluent [alirocumab], Pravastatin, Rosuvastatin, Shellfish allergy, Dilaudid [hydromorphone hcl], Oxycodone hcl, and Tramadol    Review of Systems   Review of Systems  Constitutional:  Negative for fever.   Gastrointestinal:  Positive for abdominal pain, diarrhea, nausea and vomiting. Negative for blood in stool.  Neurological:  Positive for light-headedness and headaches.    Physical Exam Updated Vital Signs BP 133/88   Pulse 72   Temp (!) 97.5 F (36.4 C) (Oral)   Resp 14   Ht 1.651 m (5\' 5" )   SpO2 94%   BMI 35.78 kg/m  Physical Exam CONSTITUTIONAL: Well developed/well nourished, uncomfortable appearing HEAD: Normocephalic/atraumatic EYES: EOMI/PERRL, no icterus ENMT: Mucous membranes dry NECK: supple no meningeal signs CV: S1/S2 noted, no murmurs/rubs/gallops noted LUNGS: Lungs are clear to auscultation bilaterally, no apparent distress ABDOMEN: soft, nontender, no rebound or guarding, bowel sounds noted throughout abdomen NEURO: Pt is awake/alert/appropriate, moves all extremitiesx4.  No facial droop.   EXTREMITIES: pulses normal/equal, full ROM SKIN: warm,pale PSYCH: mildly anxious  ED Results / Procedures / Treatments   Labs (all labs ordered are listed, but only abnormal results are displayed) Labs Reviewed  CBC WITH DIFFERENTIAL/PLATELET  COMPREHENSIVE METABOLIC PANEL  LIPASE, BLOOD    EKG EKG Interpretation  Date/Time:  Thursday Dec 16 2022 06:35:35 EDT Ventricular Rate:  72 PR Interval:  182 QRS Duration: 84 QT Interval:  396 QTC Calculation: 434 R Axis:   10 Text Interpretation: Sinus rhythm Low voltage, precordial leads Confirmed by Zadie Rhine ([id number]) on 12/16/2022 6:48:05 AM  Radiology No results found.  Procedures Procedures    Medications Ordered in ED Medications  lactated ringers bolus 1,000 mL (has no administration in time range)  sodium chloride 0.9 % bolus 1,000 mL (1,000 mLs Intravenous New Bag/Given 12/16/22 0647)  ondansetron (ZOFRAN) injection 4 mg (4 mg Intravenous Given 12/16/22 0647)  ketorolac (TORADOL) 15 MG/ML injection 15 mg (15 mg Intravenous Given 12/16/22 0272)    ED Course/ Medical Decision Making/ A&P Clinical  Course as of 12/16/22 5366  Thu Dec 16, 2022  0708 Signed out to oncoming provider at shift change [DW]    Clinical Course User Index [DW] Zadie Rhine, MD                             Medical Decision Making Amount and/or Complexity of Data Reviewed Labs: ordered. ECG/medicine tests: ordered.  Risk Prescription drug management.   This patient presents to the ED for concern of abdominal pain, vomiting and diarrhea, this involves an extensive number of treatment options, and is a complaint that carries with it a high risk of complications and morbidity.  The differential diagnosis includes but is not limited to cholecystitis, cholelithiasis, pancreatitis, gastritis, peptic ulcer disease, appendicitis, bowel obstruction, bowel perforation, diverticulitis, AAA, ischemic bowel, gastroenteritis  Comorbidities that complicate the patient evaluation: Patient's presentation is complicated by their history of VTE   Additional history obtained: Additional history obtained from spouse Records reviewed  oncology notes reviewed   Medicines ordered and prescription drug management: I ordered medication including zofran  for nausea  Reevaluation of the patient after these medicines showed that the patient    stayed the same  Critical Interventions:  IV fluids  Complexity of problems addressed: Patient's presentation is most consistent with  acute presentation with potential threat to life or bodily function           Final Clinical Impression(s) / ED Diagnoses Final diagnoses:  None    Rx / DC Orders ED Discharge Orders     None         Zadie Rhine, MD 12/16/22 [phone]

## 2022-12-16 NOTE — H&P (Signed)
History and Physical    [name] [name] ZOX:[id number] DOB: [birth year] DOA: 12/16/2022  PCP: Cleatis Polka., MD   Patient coming from: Home  Chief Complaint: N/V/D x 3 days  HPI: [name] [name] is a 57 y.o. female with medical history significant for recent right lower extremity DVT on Eliquis, hypertension, dyslipidemia, OSA on CPAP, and more who presented to the ED with intractable nausea and vomiting as well as diarrhea for the last 3 days.  She has been trying to keep fluids down as well as use Zofran at home, but with no improvement.  She denies any specific abdominal pain aside from cramping related to dry heaving.  She denies any hematemesis, or dark or bloody stools.  No fevers or chills are reported.  She denies any sick contacts or recent travel and denies any unusual food intake.  She claims to be keeping her home medications down.  She complains of ongoing headache.   ED Course: Vital signs are stable and patient is afebrile.  CT of the chest with contrast recently performed on 5/13 which rules out PE.  CT of the head performed with no acute findings.  Laboratory data within normal limits aside from potassium of 3.4.  She has been given IV fluid boluses as well as some potassium supplementation and Zofran with no improvement in her symptoms.  Review of Systems: Reviewed as noted above, otherwise negative.  Past Medical History:  Diagnosis Date   Anemia    many years ago   Anxiety    situational   Arthritis    "hands" (07/12/2012)   Asthma    seasonal    Chest pain    Chest pain at rest, on going 07/12/2012   Dercum's disease    Eczema    Family history of early CAD 07/12/2012   Fibromyalgia    GERD (gastroesophageal reflux disease)    Headache(784.0)    "often; not daily" (07/12/2012)   Hypertension    not on medications   Kidney stones    Migraine    Pneumonia    as a child   PONV (postoperative nausea and vomiting)    she states she gets  very sick    Past Surgical History:  Procedure Laterality Date   breast lift     CARDIAC CATHETERIZATION  07/11/2012   CHOLECYSTECTOMY N/A 09/18/2015   Procedure: LAPAROSCOPIC CHOLECYSTECTOMY;  Surgeon: Abigail Miyamoto, MD;  Location: [facility] OR;  Service: General;  Laterality: N/A;   FOOT SURGERY  09/2019   toe surgery   LEFT HEART CATHETERIZATION WITH CORONARY ANGIOGRAM N/A 07/11/2012   Procedure: LEFT HEART CATHETERIZATION WITH CORONARY ANGIOGRAM;  Surgeon: Runell Gess, MD;  Location: [facility] CATH LAB;  Service: Cardiovascular;  Laterality: N/A;   REDUCTION MAMMAPLASTY Bilateral 10+ years ago   TONSILLECTOMY  ~ 1976   tubes and ovaries removed  2015   VAGINAL HYSTERECTOMY  ~ 2009     reports that she has never smoked. She has never used smokeless tobacco. She reports that she does not drink alcohol and does not use drugs.  Allergies  Allergen Reactions   Augmentin [Amoxicillin-Pot Clavulanate] Anaphylaxis and Nausea And Vomiting   Betadine [Povidone Iodine]     Throat swelling, itchy lips, redness/swelling at application site.   Contrast Media [Iodinated Contrast Media] Anaphylaxis   Morphine And Codeine Anaphylaxis and Nausea And Vomiting   Prednisone Anaphylaxis   Yellow Jacket Venom [Bee Venom] Anaphylaxis   Azithromycin Other (See Comments)  SEVERE STOMACH PAIN    Erythromycin Nausea And Vomiting and Other (See Comments)    SEVERE STOMACH PAIN/abdominal pain    Iohexol Hives, Itching and Swelling    Swelling of upper lip, thick tongue, hives on face and back and itching all over   Oxycontin [Oxycodone] Nausea And Vomiting   Avelox [Moxifloxacin Hcl In Nacl] Swelling   Codeine Nausea And Vomiting and Other (See Comments)    Headaches also   Gadolinium Derivatives Hives, Itching and Swelling   Iodine    Lamisil [Terbinafine]     Pt stated, "It made me feel like I had the flu"   Levaquin [Levofloxacin In D5w] Swelling   Oxycodone-Acetaminophen Nausea And Vomiting    Pantoprazole Other (See Comments)    "Feels like I have the flu"   Praluent [Alirocumab]     Swelling in leg, pain, dizziness   Pravastatin     myalgias   Rosuvastatin     Myalgias and cramps, sore throat, indigestion   Shellfish Allergy    Dilaudid [Hydromorphone Hcl] Nausea And Vomiting   Oxycodone Hcl Nausea And Vomiting   Tramadol Nausea Only    Family History  Problem Relation Age of Onset   Hiatal hernia Mother    Hypertension Mother    Depression Mother    Allergic rhinitis Mother    Heart disease Father    Stroke Maternal Grandfather    Chronic Renal Failure Paternal Grandmother    Drug abuse Son    Allergic rhinitis Brother    Allergic rhinitis Maternal Aunt     Prior to Admission medications   Medication Sig Start Date End Date Taking? Authorizing Provider  acetaminophen (TYLENOL) 500 MG tablet Take 500 mg by mouth every 6 (six) hours as needed for mild pain.   Yes [provider]  apixaban (ELIQUIS) 5 MG TABS tablet Take 5 mg by mouth 2 (two) times daily.   Yes [provider]  ezetimibe (ZETIA) 10 MG tablet Take 1 tablet (10 mg total) by mouth daily. 11/17/21  Yes Jake Bathe, MD  icosapent Ethyl (VASCEPA) 1 g capsule Take 2 capsules (2 g total) by mouth 2 (two) times daily. 07/28/22  Yes Jake Bathe, MD  ipratropium (ATROVENT) 0.03 % nasal spray Place 1-2 sprays into both nostrils 2 (two) times daily as needed (nasal drainage). 11/25/21  Yes Ellamae Sia, DO  ketoconazole (NIZORAL) 2 % cream Apply 1 Application topically daily. 11/16/22  Yes Louann Sjogren, DPM  metroNIDAZOLE (METROGEL) 0.75 % gel metronidazole 0.75 % topical gel  APPLY ON THE SKIN DAILY FOR ROSACEA   Yes [provider]  olmesartan (BENICAR) 20 MG tablet Take 20 mg by mouth daily. 11/03/18  Yes [provider]  ondansetron (ZOFRAN-ODT) 4 MG disintegrating tablet Take by mouth. 12/15/22  Yes [provider]  RABEprazole (ACIPHEX) 20 MG tablet Take 20 mg  by mouth 2 (two) times daily.   Yes [provider]  rizatriptan (MAXALT-MLT) 10 MG disintegrating tablet Take 10 mg by mouth every 2 (two) hours as needed for migraine.  06/18/15  Yes [provider]  rosuvastatin (CRESTOR) 5 MG tablet Take 1 tablet (5 mg total) by mouth daily. 11/17/21  Yes Jake Bathe, MD  Vitamin D, Ergocalciferol, (DRISDOL) 50000 units CAPS capsule Take 50,000 Units by mouth every Wednesday.    Yes [provider]  EPINEPHrine (EPIPEN 2-PAK) 0.3 mg/0.3 mL IJ SOAJ injection Inject 0.3 mg into the muscle as needed. 11/25/21  Ellamae Sia, DO    Physical Exam: Vitals:   12/16/22 0900 12/16/22 0930 12/16/22 1139 12/16/22 1211  BP: 120/87 128/81  (!) 138/96  Pulse: 73 76  69  Resp: 11 13  20   Temp:   97.9 F (36.6 C) 98.5 F (36.9 C)  TempSrc:   Oral Oral  SpO2: 98% 97%  99%  Height:        Constitutional: mild distress, obese Vitals:   12/16/22 0900 12/16/22 0930 12/16/22 1139 12/16/22 1211  BP: 120/87 128/81  (!) 138/96  Pulse: 73 76  69  Resp: 11 13  20   Temp:   97.9 F (36.6 C) 98.5 F (36.9 C)  TempSrc:   Oral Oral  SpO2: 98% 97%  99%  Height:       Eyes: lids and conjunctivae normal Neck: normal, supple Respiratory: clear to auscultation bilaterally. Normal respiratory effort. No accessory muscle use.  Cardiovascular: Regular rate and rhythm, no murmurs. Abdomen: no tenderness, no distention. Bowel sounds positive.  Musculoskeletal:  No edema. Skin: no rashes, lesions, ulcers.  Psychiatric: Flat affect  Labs on Admission: I have personally reviewed following labs and imaging studies  CBC: Recent Labs  Lab 12/13/22 1625 12/13/22 1656 12/16/22 0634  WBC 7.9  --  7.3  NEUTROABS  --   --  5.3  HGB 13.2 12.9 13.2  HCT 40.6 38.0 40.9  MCV 95.8  --  95.3  PLT 255  --  205   Basic Metabolic Panel: Recent Labs  Lab 12/13/22 1656 12/16/22 0634  NA 138 136  K 3.9 3.4*  CL 106 103  CO2  --  22  GLUCOSE 129*  142*  BUN 13 12  CREATININE 0.90 0.87  CALCIUM  --  7.8*   GFR: Estimated Creatinine Clearance: 82.4 mL/min (by C-G formula based on SCr of 0.87 mg/dL). Liver Function Tests: Recent Labs  Lab 12/16/22 0634  AST 33  ALT 27  ALKPHOS 62  BILITOT 0.8  PROT 6.6  ALBUMIN 3.3*   Recent Labs  Lab 12/16/22 0634  LIPASE 24   No results for input(s): "AMMONIA" in the last 168 hours. Coagulation Profile: No results for input(s): "INR", "PROTIME" in the last 168 hours. Cardiac Enzymes: No results for input(s): "CKTOTAL", "CKMB", "CKMBINDEX", "TROPONINI" in the last 168 hours. BNP (last 3 results) No results for input(s): "PROBNP" in the last 8760 hours. HbA1C: Recent Labs    12/16/22 0634  HGBA1C 5.8*   CBG: No results for input(s): "GLUCAP" in the last 168 hours. Lipid Profile: No results for input(s): "CHOL", "HDL", "LDLCALC", "TRIG", "CHOLHDL", "LDLDIRECT" in the last 72 hours. Thyroid Function Tests: No results for input(s): "TSH", "T4TOTAL", "FREET4", "T3FREE", "THYROIDAB" in the last 72 hours. Anemia Panel: No results for input(s): "VITAMINB12", "FOLATE", "FERRITIN", "TIBC", "IRON", "RETICCTPCT" in the last 72 hours. Urine analysis:    Component Value Date/Time   COLORURINE YELLOW 03/13/2021 1438   APPEARANCEUR HAZY (A) 03/13/2021 1438   LABSPEC 1.021 03/13/2021 1438   PHURINE 5.0 03/13/2021 1438   GLUCOSEU NEGATIVE 03/13/2021 1438   HGBUR NEGATIVE 03/13/2021 1438   BILIRUBINUR NEGATIVE 03/13/2021 1438   KETONESUR NEGATIVE 03/13/2021 1438   PROTEINUR NEGATIVE 03/13/2021 1438   UROBILINOGEN 0.2 06/20/2010 1221   NITRITE NEGATIVE 03/13/2021 1438   LEUKOCYTESUR NEGATIVE 03/13/2021 1438    Radiological Exams on Admission: CT Head Wo Contrast  Result Date: 12/16/2022 CLINICAL DATA:  Headache EXAM: CT HEAD WITHOUT CONTRAST TECHNIQUE: Contiguous axial images were obtained from the  base of the skull through the vertex without intravenous contrast. RADIATION DOSE  REDUCTION: This exam was performed according to the departmental dose-optimization program which includes automated exposure control, adjustment of the mA and/or kV according to patient size and/or use of iterative reconstruction technique. COMPARISON:  CT Head 04/22/19 FINDINGS: Brain: No evidence of acute infarction, hemorrhage, hydrocephalus, extra-axial collection or mass lesion/mass effect. Vascular: No hyperdense vessel or unexpected calcification. Skull: Normal. Negative for fracture or focal lesion. Sinuses/Orbits: No middle ear or mastoid effusion. Paranasal sinuses are clear orbits are unremarkable. Other: None IMPRESSION: No acute intracranial abnormality. No specific etiology for headaches identified. Electronically Signed   By: Lorenza Cambridge M.D.   On: 12/16/2022 10:28    EKG: Independently reviewed. SR 72bpm.  Assessment/Plan Principal Problem:   Intractable nausea and vomiting Active Problems:   HTN (hypertension)   History of migraine   OSA on CPAP   Other allergic rhinitis   Hyperlipidemia   Acute deep vein thrombosis (DVT) of proximal vein of right lower extremity (HCC)    Intractable nausea and vomiting likely in the setting of acute gastroenteritis Continue IV fluid Antiemetics with Phenergan Clear liquid diet and advance as tolerated GI pathogen panel Consider abdominal imaging should symptoms not improve over the next 24-48 hours No significant elevation in LFTs  Mild hypokalemia secondary to above Replete and continue to monitor  Recent history of right lower extremity DVT Patient on Eliquis, continue full dose Lovenox for now until oral intake stabilizes  Hypertension Continue olmesartan Hydralazine as needed for significant elevations  OSA CPAP at night  Dyslipidemia Hold Zetia and Crestor for now until dietary intake improves  Obesity BMI 35.78   DVT prophylaxis: Lovenox full dose Code Status: Full Family Communication: Husband on  phone Disposition Plan:Admit for symptom management Consults called:None Admission status: Obs, MedSurg  Severity of Illness: The appropriate patient status for this patient is OBSERVATION. Observation status is judged to be reasonable and necessary in order to provide the required intensity of service to ensure the patient's safety. The patient's presenting symptoms, physical exam findings, and initial radiographic and laboratory data in the context of their medical condition is felt to place them at decreased risk for further clinical deterioration. Furthermore, it is anticipated that the patient will be medically stable for discharge from the hospital within 2 midnights of admission.    Kavion Mancinas D Sherryll Burger DO [facility]  If 7PM-7AM, please contact night-coverage [email or web address]  12/16/2022, 12:20 PM

## 2022-12-16 NOTE — ED Notes (Signed)
Report given and accepted by receiving nurse on 300 

## 2022-12-16 NOTE — ED Provider Notes (Signed)
Patient continues to have dizziness nausea and vomiting after antiemetics and fluids.  CT head was negative.  She will be admitted to medicine for persistent vomiting   Bethann Berkshire, MD 12/16/22 1130

## 2022-12-16 NOTE — ED Notes (Signed)
Attempted to call report

## 2022-12-16 NOTE — ED Triage Notes (Addendum)
Pt c/o n/v/d x3 days, dizziness, and headache.   Pt just started on Eliquis 2 weeks ago for DVT, denies having any dark stools.

## 2022-12-16 NOTE — Progress Notes (Signed)
ANTICOAGULATION CONSULT NOTE - Initial Consult  Pharmacy Consult for lovenox Indication: recent DVT  Allergies  Allergen Reactions   Augmentin [Amoxicillin-Pot Clavulanate] Anaphylaxis and Nausea And Vomiting   Betadine [Povidone Iodine]     Throat swelling, itchy lips, redness/swelling at application site.   Contrast Media [Iodinated Contrast Media] Anaphylaxis   Morphine And Codeine Anaphylaxis and Nausea And Vomiting   Prednisone Anaphylaxis   Yellow Jacket Venom [Bee Venom] Anaphylaxis   Azithromycin Other (See Comments)    SEVERE STOMACH PAIN    Erythromycin Nausea And Vomiting and Other (See Comments)    SEVERE STOMACH PAIN/abdominal pain    Iohexol Hives, Itching and Swelling    Swelling of upper lip, thick tongue, hives on face and back and itching all over   Oxycontin [Oxycodone] Nausea And Vomiting   Avelox [Moxifloxacin Hcl In Nacl] Swelling   Codeine Nausea And Vomiting and Other (See Comments)    Headaches also   Gadolinium Derivatives Hives, Itching and Swelling   Iodine    Lamisil [Terbinafine]     Pt stated, "It made me feel like I had the flu"   Levaquin [Levofloxacin In D5w] Swelling   Oxycodone-Acetaminophen Nausea And Vomiting   Pantoprazole Other (See Comments)    "Feels like I have the flu"   Praluent [Alirocumab]     Swelling in leg, pain, dizziness   Pravastatin     myalgias   Rosuvastatin     Myalgias and cramps, sore throat, indigestion   Shellfish Allergy    Dilaudid [Hydromorphone Hcl] Nausea And Vomiting   Oxycodone Hcl Nausea And Vomiting   Tramadol Nausea Only    Patient Measurements: Height: 5\' 5"  (165.1 cm) Weight: 104.5 kg (230 lb 6.1 oz) IBW/kg (Calculated) : 57 Heparin Dosing Weight:   Vital Signs: Temp: 98.5 F (36.9 C) (05/16 1221) Temp Source: Oral (05/16 1221) BP: 138/96 (05/16 1221) Pulse Rate: 69 (05/16 1221)  Labs: Recent Labs    12/13/22 1625 12/13/22 1656 12/16/22 0634  HGB 13.2 12.9 13.2  HCT 40.6 38.0  40.9  PLT 255  --  205  CREATININE  --  0.90 0.87    Estimated Creatinine Clearance: 85.6 mL/min (by C-G formula based on SCr of 0.87 mg/dL).   Medical History: Past Medical History:  Diagnosis Date   Anemia    many years ago   Anxiety    situational   Arthritis    "hands" (07/12/2012)   Asthma    seasonal    Chest pain    Chest pain at rest, on going 07/12/2012   Dercum's disease    Eczema    Family history of early CAD 07/12/2012   Fibromyalgia    GERD (gastroesophageal reflux disease)    Headache(784.0)    "often; not daily" (07/12/2012)   Hypertension    not on medications   Kidney stones    Migraine    Pneumonia    as a child   PONV (postoperative nausea and vomiting)    she states she gets very sick    Medications:  Medications Prior to Admission  Medication Sig Dispense Refill Last Dose   acetaminophen (TYLENOL) 500 MG tablet Take 500 mg by mouth every 6 (six) hours as needed for mild pain.   12/15/2022   apixaban (ELIQUIS) 5 MG TABS tablet Take 5 mg by mouth 2 (two) times daily.   12/15/2022 at 2200   ezetimibe (ZETIA) 10 MG tablet Take 1 tablet (10 mg total) by mouth daily. 90  tablet 3 Past Week   icosapent Ethyl (VASCEPA) 1 g capsule Take 2 capsules (2 g total) by mouth 2 (two) times daily. 360 capsule 0 Past Week   ipratropium (ATROVENT) 0.03 % nasal spray Place 1-2 sprays into both nostrils 2 (two) times daily as needed (nasal drainage). 30 mL 5 Past Week   ketoconazole (NIZORAL) 2 % cream Apply 1 Application topically daily. 60 g 2 Past Week   metroNIDAZOLE (METROGEL) 0.75 % gel metronidazole 0.75 % topical gel  APPLY ON THE SKIN DAILY FOR ROSACEA   PRN at PRN   olmesartan (BENICAR) 20 MG tablet Take 20 mg by mouth daily.   12/16/2022   ondansetron (ZOFRAN-ODT) 4 MG disintegrating tablet Take by mouth.   12/16/2022   RABEprazole (ACIPHEX) 20 MG tablet Take 20 mg by mouth 2 (two) times daily.   Past Week   rizatriptan (MAXALT-MLT) 10 MG disintegrating  tablet Take 10 mg by mouth every 2 (two) hours as needed for migraine.   11 12/16/2022   rosuvastatin (CRESTOR) 5 MG tablet Take 1 tablet (5 mg total) by mouth daily. 90 tablet 3 Past Week   Vitamin D, Ergocalciferol, (DRISDOL) 50000 units CAPS capsule Take 50,000 Units by mouth every Wednesday.    Past Week   EPINEPHrine (EPIPEN 2-PAK) 0.3 mg/0.3 mL IJ SOAJ injection Inject 0.3 mg into the muscle as needed. 1 each 1 PRN at PRN    Assessment: Pharmacy consulted to dose lovenox in patient with history of DVT 11/2022. Patient is on Eliquis prior to admission- received a dose 5/16 1154. Holding Eliquis until oral intake improves.  Goal of Therapy:   Monitor platelets by anticoagulation protocol: Yes   Plan:  Hold Eliquis Start lovenox 100 mg subq every 12 hours. Monitor H&H and s/s of bleeding.  Judeth Cornfield, PharmD Clinical Pharmacist 12/16/2022 12:41 PM

## 2022-12-16 NOTE — ED Notes (Signed)
IV med administration delayed d/t small bore access. Pt required multiple attempts to obtain initial access.

## 2022-12-16 NOTE — Progress Notes (Signed)
Patient was set up on BIPAP due to needing titration with O2 bled in. Patient attempted to wear mask but due to a headache and comfort she wasn't able to wear the mask( she needs  a full face mask). RT setup O2 to a water at 3lpm [place] for sleep and will continue to monitor.BIPAP remains at bedside if needed.

## 2022-12-17 ENCOUNTER — Encounter: Payer: Self-pay | Admitting: Internal Medicine

## 2022-12-17 DIAGNOSIS — R112 Nausea with vomiting, unspecified: Secondary | ICD-10-CM | POA: Diagnosis not present

## 2022-12-17 LAB — CBC
HCT: 36.9 % (ref 36.0–46.0)
Hemoglobin: 11.6 g/dL — ABNORMAL LOW (ref 12.0–15.0)
MCH: 30.2 pg (ref 26.0–34.0)
MCHC: 31.4 g/dL (ref 30.0–36.0)
MCV: 96.1 fL (ref 80.0–100.0)
Platelets: 188 10*3/uL (ref 150–400)
RBC: 3.84 MIL/uL — ABNORMAL LOW (ref 3.87–5.11)
RDW: 13.5 % (ref 11.5–15.5)
WBC: 5.4 10*3/uL (ref 4.0–10.5)
nRBC: 0 % (ref 0.0–0.2)

## 2022-12-17 LAB — COMPREHENSIVE METABOLIC PANEL
ALT: 26 U/L (ref 0–44)
AST: 23 U/L (ref 15–41)
Albumin: 2.9 g/dL — ABNORMAL LOW (ref 3.5–5.0)
Alkaline Phosphatase: 55 U/L (ref 38–126)
Anion gap: 8 (ref 5–15)
BUN: 9 mg/dL (ref 6–20)
CO2: 25 mmol/L (ref 22–32)
Calcium: 7.9 mg/dL — ABNORMAL LOW (ref 8.9–10.3)
Chloride: 107 mmol/L (ref 98–111)
Creatinine, Ser: 0.76 mg/dL (ref 0.44–1.00)
GFR, Estimated: >60 mL/min (ref >60)
Glucose, Bld: 93 mg/dL (ref 70–99)
Potassium: 3.7 mmol/L (ref 3.5–5.1)
Sodium: 140 mmol/L (ref 135–145)
Total Bilirubin: 0.7 mg/dL (ref 0.3–1.2)
Total Protein: 5.6 g/dL — ABNORMAL LOW (ref 6.5–8.1)

## 2022-12-17 LAB — GASTROINTESTINAL PANEL BY PCR, STOOL (REPLACES STOOL CULTURE)

## 2022-12-17 LAB — MAGNESIUM: Magnesium: 2 mg/dL (ref 1.7–2.4)

## 2022-12-17 MED ORDER — ACETAMINOPHEN 325 MG PO TABS
650.0000 mg | ORAL_TABLET | Freq: Four times a day (QID) | ORAL | Status: DC | PRN
  Administered 2022-12-17: 650 mg via ORAL
  Filled 2022-12-17: qty 2

## 2022-12-17 MED ORDER — SCOPOLAMINE 1 MG/3DAYS TD PT72: 1.0000 | MEDICATED_PATCH | TRANSDERMAL | 1 refills | Status: AC

## 2022-12-17 NOTE — Hospital Course (Signed)
56yo with h/o RLE DVT on Eliquis, HLD, HTN, and OSA on CPAP who presented with n/v/d x 3 days thought to be related to acute gastroenteritis.

## 2022-12-17 NOTE — Discharge Summary (Signed)
Physician Discharge Summary   Patient: [name] [name] MRN: [id number] DOB: [birth year]  Admit date:     12/16/2022  Discharge date: 12/17/22  Discharge Physician: Jonah Blue   PCP: Cleatis Polka., MD   Recommendations at discharge:   Follow up next week with PCP GI pathogen panel is pending - will notify you if this requires different treatment Use scopolamine patch as needed for persistent nausea  Discharge Diagnoses: Principal Problem:   Intractable nausea and vomiting Active Problems:   HTN (hypertension)   History of migraine   OSA on CPAP   Other allergic rhinitis   Hyperlipidemia   Acute deep vein thrombosis (DVT) of proximal vein of right lower extremity [facility])    Hospital Course: 57yo with h/o RLE DVT on Eliquis, HLD, HTN, and OSA on CPAP who presented with n/v/d x 3 days thought to be related to acute gastroenteritis.    Assessment and Plan:  Intractable nausea and vomiting likely in the setting of acute gastroenteritis -Patient with acute onset of n/v/d -Symptoms have abated, small stool last night (pathogen panel pending) and no further diarrhea -No vomiting this AM, mild nausea, able to tolerate PO -She is requesting to go home today -She is open to scopolamine patch for prn use   RLE DVT -Continue Eliquis, likely needs treatment for 3-6 months   Hypertension -Continue olmesartan   OSA -Resume home CPAP at night -She has a significant headache (negative head CT) that she attributes to not having her home CPAP and she wishes to return home for this   Dyslipidemia -Resume Zetia and Crestor  Obesity -Body mass index is 38.34 kg/m..  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 and patient was instructed, not to  drive, operate heavy machinery, perform activities at heights, swimming or participation in water activities or provide baby-sitting services while on Pain, Sleep and Anxiety Medications; until their outpatient Physician has advised to do so again. Also recommended to not to take more than prescribed Pain, Sleep and Anxiety Medications.   Consultants: None Procedures performed: None  Disposition: Home Diet recommendation:  Clear liquid diet, advance as tolerated DISCHARGE MEDICATION: Allergies as of 12/17/2022       Reactions   Augmentin [amoxicillin-pot Clavulanate] Anaphylaxis, Nausea And Vomiting   Betadine [povidone Iodine]    Throat swelling, itchy lips, redness/swelling at application site.   Contrast Media [iodinated Contrast Media] Anaphylaxis   Morphine And Codeine Anaphylaxis, Nausea And Vomiting   Prednisone Anaphylaxis   Yellow Jacket Venom [bee Venom] Anaphylaxis   Azithromycin Other (See Comments)   SEVERE STOMACH PAIN   Erythromycin Nausea And Vomiting, Other (See Comments)   SEVERE STOMACH PAIN/abdominal pain   Iohexol Hives, Itching, Swelling   Swelling of upper lip, thick tongue, hives on face and back and itching all over   Oxycontin [oxycodone] Nausea And Vomiting   Avelox [moxifloxacin Hcl In Nacl] Swelling   Codeine Nausea And Vomiting, Other (See Comments)   Headaches also   Gadolinium Derivatives Hives, Itching, Swelling   Iodine    Lamisil [terbinafine]    Pt stated, "It made me feel like I had the flu"   Levaquin [levofloxacin In D5w] Swelling   Oxycodone-acetaminophen Nausea And Vomiting   Pantoprazole Other (See Comments)   "Feels like I have the flu"   Praluent [  alirocumab]    Swelling in leg, pain, dizziness   Pravastatin    myalgias   Rosuvastatin    Myalgias and cramps, sore throat, indigestion   Shellfish Allergy    Dilaudid [hydromorphone Hcl] Nausea And Vomiting   Oxycodone Hcl Nausea And Vomiting   Tramadol Nausea Only         Medication List     TAKE these medications    acetaminophen 500 MG tablet Commonly known as: TYLENOL Take 500 mg by mouth every 6 (six) hours as needed for mild pain.   apixaban 5 MG Tabs tablet Commonly known as: ELIQUIS Take 5 mg by mouth 2 (two) times daily.   EPINEPHrine 0.3 mg/0.3 mL Soaj injection Commonly known as: EpiPen 2-Pak Inject 0.3 mg into the muscle as needed.   ezetimibe 10 MG tablet Commonly known as: ZETIA Take 1 tablet (10 mg total) by mouth daily.   icosapent Ethyl 1 g capsule Commonly known as: VASCEPA Take 2 capsules (2 g total) by mouth 2 (two) times daily.   ipratropium 0.03 % nasal spray Commonly known as: ATROVENT Place 1-2 sprays into both nostrils 2 (two) times daily as needed (nasal drainage).   ketoconazole 2 % cream Commonly known as: NIZORAL Apply 1 Application topically daily.   metroNIDAZOLE 0.75 % gel Commonly known as: METROGEL metronidazole 0.75 % topical gel  APPLY ON THE SKIN DAILY FOR ROSACEA   olmesartan 20 MG tablet Commonly known as: BENICAR Take 20 mg by mouth daily.   ondansetron 4 MG disintegrating tablet Commonly known as: ZOFRAN-ODT Take by mouth.   RABEprazole 20 MG tablet Commonly known as: ACIPHEX Take 20 mg by mouth 2 (two) times daily.   rizatriptan 10 MG disintegrating tablet Commonly known as: MAXALT-MLT Take 10 mg by mouth every 2 (two) hours as needed for migraine.   rosuvastatin 5 MG tablet Commonly known as: CRESTOR Take 1 tablet (5 mg total) by mouth daily.   scopolamine 1 MG/3DAYS Commonly known as: TRANSDERM-SCOP Place 1 patch (1.5 mg total) onto the skin every 3 (three) days.   Vitamin D (Ergocalciferol) 1.25 MG (50000 UNIT) Caps capsule Commonly known as: DRISDOL Take 50,000 Units by mouth every Wednesday.        Discharge Exam: Filed Weights   12/16/22 1221  Weight: 104.5 kg     Subjective: She reports feeling better today, would like to go home.  Has persistent headache,  does not have home Maxalt here and thinks this will improve with home CPAP.  She does have mild persistent nausea, open to scopolamine patch.   Physical Exam:       Vitals:    12/16/22 1519 12/16/22 2012 12/17/22 0013 12/17/22 0358  BP:   109/71 113/72 109/68  Pulse:   63 73 66  Resp:   16 18 18   Temp:   98.4 F (36.9 C) 98.2 F (36.8 C) 97.9 F (36.6 C)  TempSrc:          SpO2: 95% 97% 100% 99%  Weight:          Height:            General:  Appears calm and comfortable and is in NAD, recent shower Eyes:   EOMI, normal lids, iris ENT:  grossly normal hearing, lips & tongue, mmm Neck:  no LAD, masses or thyromegaly Cardiovascular:  RRR, no m/r/g. No LE edema.  Respiratory:   CTA bilaterally with no wheezes/rales/rhonchi.  Normal respiratory effort. Abdomen:  soft, NT, ND  Skin:  no rash or induration seen on limited exam Musculoskeletal:  grossly normal tone BUE/BLE, good ROM, no bony abnormality Psychiatric:  grossly normal mood and affect, speech fluent and appropriate, AOx3 Neurologic:  CN 2-12 grossly intact, moves all extremities in coordinated fashion       EKG: Independently reviewed.  NSR with rate 72; nonspecific ST changes with no evidence of acute ischemia     Labs on Admission: I have personally reviewed the available labs and imaging studies at the time of the admission.   Pertinent labs:     Unremarkable CMP (albumin 2.9) WBC 5.4 Hgb 11.6, 13.2 on 5/16 A1c 5.8 on 5/16 GI pathogen panel is pending  Condition at discharge: good  The results of significant diagnostics from this hospitalization (including imaging, microbiology, ancillary and laboratory) are listed below for reference.   Imaging Studies: CT Head Wo Contrast  Result Date: 12/16/2022 CLINICAL DATA:  Headache EXAM: CT HEAD WITHOUT CONTRAST TECHNIQUE: Contiguous axial images were obtained from the base of the skull through the vertex without intravenous contrast. RADIATION DOSE REDUCTION: This  exam was performed according to the departmental dose-optimization program which includes automated exposure control, adjustment of the mA and/or kV according to patient size and/or use of iterative reconstruction technique. COMPARISON:  CT Head 04/22/19 FINDINGS: Brain: No evidence of acute infarction, hemorrhage, hydrocephalus, extra-axial collection or mass lesion/mass effect. Vascular: No hyperdense vessel or unexpected calcification. Skull: Normal. Negative for fracture or focal lesion. Sinuses/Orbits: No middle ear or mastoid effusion. Paranasal sinuses are clear orbits are unremarkable. Other: None IMPRESSION: No acute intracranial abnormality. No specific etiology for headaches identified. Electronically Signed   By: Lorenza Cambridge M.D.   On: 12/16/2022 10:28   CT Angio Chest PE W and/or Wo Contrast  Result Date: 12/13/2022 CLINICAL DATA:  Shortness of breath EXAM: CT ANGIOGRAPHY CHEST WITH CONTRAST TECHNIQUE: Multidetector CT imaging of the chest was performed using the standard protocol during bolus administration of intravenous contrast. Multiplanar CT image reconstructions and MIPs were obtained to evaluate the vascular anatomy. RADIATION DOSE REDUCTION: This exam was performed according to the departmental dose-optimization program which includes automated exposure control, adjustment of the mA and/or kV according to patient size and/or use of iterative reconstruction technique. CONTRAST:  80mL OMNIPAQUE IOHEXOL 350 MG/ML SOLN COMPARISON:  CT chest 12/29/2018 FINDINGS: Cardiovascular: Satisfactory opacification of the pulmonary arteries to the segmental level. No evidence of pulmonary embolism. Normal heart size. No pericardial effusion. Nonaneurysmal aorta. No dissection. 5 mm right upper lobe Peri fissural nodule, series 12, image 56, probably stable on sagittal reformatted images compared with 2020, no imaging follow-up is recommended. Mediastinum/Nodes: No enlarged mediastinal, hilar, or axillary  lymph nodes. Thyroid gland, trachea, and esophagus demonstrate no significant findings. Lungs/Pleura: Lungs are clear. No pleural effusion or pneumothorax. Upper Abdomen: No acute abnormality. Musculoskeletal: No chest wall abnormality. No acute or significant osseous findings. Review of the MIP images confirms the above findings. IMPRESSION: Negative. No CT evidence for acute pulmonary embolus or aortic dissection. Clear lung fields. Electronically Signed   By: Jasmine Pang M.D.   On: 12/13/2022 20:50   MR TIBIA FIBULA RIGHT WO CONTRAST  Result Date: 11/24/2022 CLINICAL DATA:  Soft tissue mass, lower leg, deep. EXAM: MRI OF LOWER RIGHT EXTREMITY WITHOUT CONTRAST TECHNIQUE: Multiplanar, multisequence MR imaging of the right lower extremity was performed. No intravenous contrast was administered. COMPARISON:  None Available. FINDINGS: Bones/Joint/Cartilage Marrow signal is within normal limits. No evidence of fracture or osteonecrosis. Small knee  joint effusion. Ligaments Intact. Muscles and Tendons Muscles are normal in bulk. Tendons of the flexor, extensor compartments are intact. No intramuscular mass. Soft tissues Subcutaneous soft tissue edema about the anterior, lateral and medial aspect of the lower leg. No fluid collection or hematoma. IMPRESSION: 1. Subcutaneous soft tissue edema about the anterior, lateral and medial aspect of the lower leg without fluid collection or hematoma. 2. No evidence of fracture or osteonecrosis. 3. Muscles and tendons are intact. 4. Small knee joint effusion. 5. No evidence of soft tissue mass. Electronically Signed   By: Larose Hires D.O.   On: 11/24/2022 14:32   MR [id number] RIGHT WO CONTRAST  Result Date: 11/24/2022 CLINICAL DATA:  Soft tissue mass, thigh, deep. Soft tissue masses throughout the right upper and lower leg. EXAM: MRI OF THE RIGHT FEMUR WITHOUT CONTRAST TECHNIQUE: Multiplanar, multisequence MR imaging of the right femur was performed. No intravenous contrast  was administered. COMPARISON:  None Available. FINDINGS: Bones/Joint/Cartilage Marrow signal is within normal limits. Mild arthritic changes of the right hip joint as well as right knee joint. Small knee joint effusion. Ligaments Intact. Muscles and Tendons Muscles are normal in bulk. No intramuscular hematoma or appreciable muscular mass. Small Baker's cyst. Soft tissues Skin and subcutaneous soft tissues are within normal limits. No appreciable soft tissue mass. Subcutaneous soft tissue edema about the anteromedial aspect of the knee. IMPRESSION: 1. No evidence of soft tissue mass or fluid collection. 2. Subcutaneous soft tissue edema about the anteromedial aspect of the knee. 3. Mild arthritic changes of the right hip joint as well as right knee joint. Small knee joint effusion. 4. Small Baker's cyst. Electronically Signed   By: Larose Hires D.O.   On: 11/24/2022 14:29   US Venous Img Lower Unilateral Right  Result Date: 11/24/2022 CLINICAL DATA:  Right lower extremity pain, erythema, and swelling for 1 month EXAM: RIGHT LOWER EXTREMITY VENOUS DOPPLER ULTRASOUND TECHNIQUE: Gray-scale sonography with compression, as well as color and duplex ultrasound, were performed to evaluate the deep venous system(s) from the level of the common femoral vein through the popliteal and proximal calf veins. COMPARISON:  None Available. FINDINGS: VENOUS Nonocclusive DVT is observed in the right superficial femoral vein midportion. There is also calf vein DVT in the anterior tibial and posterior tibial veins. The remaining venous structures are patent. Limited views of the contralateral common femoral vein are unremarkable. OTHER None. Limitations: none IMPRESSION: 1. Nonocclusive DVT is observed in the right superficial femoral vein midportion. There is also calf vein DVT in the anterior tibial and posterior tibial veins. Electronically Signed   By: Gaylyn Rong M.D.   On: 11/24/2022 11:34    Microbiology: Results for  orders placed or performed during the hospital encounter of 09/27/20  Group A Strep by PCR     Status: None   Collection Time: 09/27/20  1:19 PM   Specimen: Throat; Sterile Swab  Result Value Ref Range Status   Group A Strep by PCR NOT DETECTED NOT DETECTED Final    Comment: Performed at [facility], [address], [place], [place] [address]  Resp Panel by RT-PCR (Flu A&B, Covid) Nasopharyngeal Swab     Status: None   Collection Time: 09/27/20  1:20 PM   Specimen: Nasopharyngeal Swab; Nasopharyngeal(NP) swabs in vial transport medium  Result Value Ref Range Status   SARS Coronavirus 2 by RT PCR NEGATIVE NEGATIVE Final    Comment: (NOTE) SARS-CoV-2 target nucleic acids are NOT DETECTED.  The SARS-CoV-2 RNA  is generally detectable in upper respiratory specimens during the acute phase of infection. The lowest concentration of SARS-CoV-2 viral copies this assay can detect is 138 copies/mL. A negative result does not preclude SARS-Cov-2 infection and should not be used as the sole basis for treatment or other patient management decisions. A negative result may occur with  improper specimen collection/handling, submission of specimen other than nasopharyngeal swab, presence of viral mutation(s) within the areas targeted by this assay, and inadequate number of viral copies(<138 copies/mL). A negative result must be combined with clinical observations, patient history, and epidemiological information. The expected result is Negative.  Fact Sheet for Patients:  BloggerCourse.com  Fact Sheet for Healthcare Providers:  SeriousBroker.it  This test is no t yet approved or cleared by the [place] FDA and  has been authorized for detection and/or diagnosis of SARS-CoV-2 by FDA under an Emergency Use Authorization (EUA). This EUA will remain  in effect (meaning this test can be used) for the duration of the COVID-19 declaration under  Section 564(b)(1) of the Act, 21 U.S.C.section 360bbb-3(b)(1), unless the authorization is terminated  or revoked sooner.       Influenza A by PCR NEGATIVE NEGATIVE Final   Influenza B by PCR NEGATIVE NEGATIVE Final    Comment: (NOTE) The Xpert Xpress SARS-CoV-2/FLU/RSV plus assay is intended as an aid in the diagnosis of influenza from Nasopharyngeal swab specimens and should not be used as a sole basis for treatment. Nasal washings and aspirates are unacceptable for Xpert Xpress SARS-CoV-2/FLU/RSV testing.  Fact Sheet for Patients: BloggerCourse.com  Fact Sheet for Healthcare Providers: SeriousBroker.it  This test is not yet approved or cleared by the [place] FDA and has been authorized for detection and/or diagnosis of SARS-CoV-2 by FDA under an Emergency Use Authorization (EUA). This EUA will remain in effect (meaning this test can be used) for the duration of the COVID-19 declaration under Section 564(b)(1) of the Act, 21 U.S.C. section 360bbb-3(b)(1), unless the authorization is terminated or revoked.  Performed at [facility], [address], [place], [place] [address]     Discharge time spent: greater than 30 minutes.  Signed: Jonah Blue, MD [facility] 12/17/2022

## 2022-12-17 NOTE — TOC Progression Note (Signed)
  Transition of Care [facility]) Screening Note   Patient Details  Name: [name] [name] Date of Birth: 04-Jan-1966   [facility]) CM/SW Contact:    Elliot Gault, LCSW Phone Number: 12/17/2022, 9:48 AM    Transition of Care Department [facility]) has reviewed patient and no TOC needs have been identified at this time. We will continue to monitor patient advancement through interdisciplinary progression rounds. If new patient transition needs arise, please place a TOC consult.

## 2022-12-17 NOTE — Telephone Encounter (Signed)
Called pt and left VM to let her know that she will need labs drawn next week and to give [place] a call to set up appt. In the message, appt details for [place] and follow up were also left along with CB#.

## 2022-12-17 NOTE — Progress Notes (Signed)
Patient concerned ( stating she feels it is too low) with  SpO2 of 91% at rest and 94% post exertion. Patient completed four laps around room and began to feel light headed . Patient return to bed , still with chronic headache.

## 2022-12-18 ENCOUNTER — Emergency Department (HOSPITAL_COMMUNITY)
Admission: EM | Admit: 2022-12-18 | Discharge: 2022-12-18 | Disposition: A | Discharge status: Home / Self Care | Payer: PRIVATE HEALTH INSURANCE | Attending: Emergency Medicine | Admitting: Emergency Medicine

## 2022-12-18 ENCOUNTER — Other Ambulatory Visit: Payer: Self-pay

## 2022-12-18 ENCOUNTER — Encounter (HOSPITAL_COMMUNITY): Payer: Self-pay

## 2022-12-18 ENCOUNTER — Emergency Department (HOSPITAL_COMMUNITY): Payer: PRIVATE HEALTH INSURANCE

## 2022-12-18 DIAGNOSIS — T7840XA Allergy, unspecified, initial encounter: Secondary | ICD-10-CM | POA: Diagnosis present

## 2022-12-18 DIAGNOSIS — Z7901 Long term (current) use of anticoagulants: Secondary | ICD-10-CM | POA: Insufficient documentation

## 2022-12-18 LAB — COMPREHENSIVE METABOLIC PANEL WITH GFR
ALT: 37 U/L (ref 0–44)
AST: 28 U/L (ref 15–41)
Albumin: 3.6 g/dL (ref 3.5–5.0)
Alkaline Phosphatase: 61 U/L (ref 38–126)
Anion gap: 11 (ref 5–15)
BUN: 8 mg/dL (ref 6–20)
CO2: 27 mmol/L (ref 22–32)
Calcium: 8.8 mg/dL — ABNORMAL LOW (ref 8.9–10.3)
Chloride: 102 mmol/L (ref 98–111)
Creatinine, Ser: 0.9 mg/dL (ref 0.44–1.00)
GFR, Estimated: >60 mL/min
Glucose, Bld: 107 mg/dL — ABNORMAL HIGH (ref 70–99)
Potassium: 3.3 mmol/L — ABNORMAL LOW (ref 3.5–5.1)
Sodium: 140 mmol/L (ref 135–145)
Total Bilirubin: 0.4 mg/dL (ref 0.3–1.2)
Total Protein: 6.8 g/dL (ref 6.5–8.1)

## 2022-12-18 LAB — CBC WITH DIFFERENTIAL/PLATELET
Abs Immature Granulocytes: 0.02 10*3/uL (ref 0.00–0.07)
Basophils Absolute: 0 10*3/uL (ref 0.0–0.1)
Basophils Relative: 0 %
Eosinophils Absolute: 0.2 10*3/uL (ref 0.0–0.5)
Eosinophils Relative: 2 %
HCT: 42.1 % (ref 36.0–46.0)
Hemoglobin: 13.3 g/dL (ref 12.0–15.0)
Immature Granulocytes: 0 %
Lymphocytes Relative: 29 %
Lymphs Abs: 2.5 10*3/uL (ref 0.7–4.0)
MCH: 30.3 pg (ref 26.0–34.0)
MCHC: 31.6 g/dL (ref 30.0–36.0)
MCV: 95.9 fL (ref 80.0–100.0)
Monocytes Absolute: 0.6 10*3/uL (ref 0.1–1.0)
Monocytes Relative: 7 %
Neutro Abs: 5.3 10*3/uL (ref 1.7–7.7)
Neutrophils Relative %: 62 %
Platelets: 279 10*3/uL (ref 150–400)
RBC: 4.39 MIL/uL (ref 3.87–5.11)
RDW: 13.1 % (ref 11.5–15.5)
WBC: 8.6 10*3/uL (ref 4.0–10.5)
nRBC: 0 % (ref 0.0–0.2)

## 2022-12-18 LAB — TROPONIN I (HIGH SENSITIVITY): Troponin I (High Sensitivity): 3 ng/L

## 2022-12-18 MED ORDER — METHYLPREDNISOLONE SODIUM SUCC 125 MG IJ SOLR: 125.0000 mg | Freq: Once | INTRAMUSCULAR | Status: DC

## 2022-12-18 MED ORDER — METHYLPREDNISOLONE SODIUM SUCC 125 MG IJ SOLR
125.0000 mg | Freq: Once | INTRAMUSCULAR | Status: AC
  Administered 2022-12-18: 125 mg via INTRAMUSCULAR
  Filled 2022-12-18: qty 2

## 2022-12-18 MED ORDER — POTASSIUM CHLORIDE CRYS ER 20 MEQ PO TBCR
40.0000 meq | EXTENDED_RELEASE_TABLET | Freq: Once | ORAL | Status: AC
  Administered 2022-12-18: 40 meq via ORAL
  Filled 2022-12-18: qty 2

## 2022-12-18 MED ORDER — PREDNISONE 10 MG PO TABS: 40.0000 mg | ORAL_TABLET | Freq: Every day | ORAL | 0 refills | Status: DC

## 2022-12-18 MED ORDER — SODIUM CHLORIDE 0.9 % IV BOLUS: 1000.0000 mL | Freq: Once | INTRAVENOUS | Status: DC

## 2022-12-18 MED ORDER — FAMOTIDINE IN NACL 20-0.9 MG/50ML-% IV SOLN: 20.0000 mg | Freq: Once | INTRAVENOUS | Status: DC

## 2022-12-18 MED ORDER — FAMOTIDINE 20 MG PO TABS
20.0000 mg | ORAL_TABLET | Freq: Once | ORAL | Status: AC
  Administered 2022-12-18: 20 mg via ORAL
  Filled 2022-12-18: qty 1

## 2022-12-18 NOTE — Discharge Instructions (Addendum)
Return for any problem.   Take prednisone as instructed.

## 2022-12-18 NOTE — Progress Notes (Signed)
GI pathogen panel came back, positive for Rotavirus.  I called and left a message for the patient on voicemail.   Georgana Curio, M.D. 12/18/22 at 1600

## 2022-12-18 NOTE — ED Provider Notes (Signed)
[name] Provider Note   CSN: [id number] Arrival date & time: 12/18/22  1657     History  Chief Complaint  Patient presents with   Allergic Reaction    [name] [name] is a 57 y.o. female.  57 year old female with prior medical history as detailed below presents for evaluation.  Patient arrives with EMS from home.  She reports allergic type symptoms prior to arrival.  She administered EpiPen at home prior to EMS transport.  Patient reports significant history of allergies in the past.  Patient reports having IV contrasted study on Monday of this past week.  Patient is concerned that symptoms today could be a late allergic reaction to same.  After EpiPen administration patient feels significantly improved.  The history is provided by the patient, a relative and medical records.       Home Medications Prior to Admission medications   Medication Sig Start Date End Date Taking? Authorizing Provider  acetaminophen (TYLENOL) 500 MG tablet Take 500 mg by mouth every 6 (six) hours as needed for mild pain.    [provider]  apixaban (ELIQUIS) 5 MG TABS tablet Take 5 mg by mouth 2 (two) times daily.    [provider]  EPINEPHrine (EPIPEN 2-PAK) 0.3 mg/0.3 mL IJ SOAJ injection Inject 0.3 mg into the muscle as needed. 11/25/21   Ellamae Sia, DO  ezetimibe (ZETIA) 10 MG tablet Take 1 tablet (10 mg total) by mouth daily. 11/17/21   Jake Bathe, MD  icosapent Ethyl (VASCEPA) 1 g capsule Take 2 capsules (2 g total) by mouth 2 (two) times daily. 07/28/22   Jake Bathe, MD  ipratropium (ATROVENT) 0.03 % nasal spray Place 1-2 sprays into both nostrils 2 (two) times daily as needed (nasal drainage). 11/25/21   Ellamae Sia, DO  ketoconazole (NIZORAL) 2 % cream Apply 1 Application topically daily. 11/16/22   Louann Sjogren, DPM  metroNIDAZOLE (METROGEL) 0.75 % gel metronidazole 0.75 % topical gel  APPLY ON THE SKIN  DAILY FOR ROSACEA    [provider]  olmesartan (BENICAR) 20 MG tablet Take 20 mg by mouth daily. 11/03/18   [provider]  ondansetron (ZOFRAN-ODT) 4 MG disintegrating tablet Take by mouth. 12/15/22   [provider]  RABEprazole (ACIPHEX) 20 MG tablet Take 20 mg by mouth 2 (two) times daily.    [provider]  rizatriptan (MAXALT-MLT) 10 MG disintegrating tablet Take 10 mg by mouth every 2 (two) hours as needed for migraine.  06/18/15   [provider]  rosuvastatin (CRESTOR) 5 MG tablet Take 1 tablet (5 mg total) by mouth daily. 11/17/21   Jake Bathe, MD  scopolamine (TRANSDERM-SCOP) 1 MG/3DAYS Place 1 patch (1.5 mg total) onto the skin every 3 (three) days. 12/17/22   Jonah Blue, MD  Vitamin D, Ergocalciferol, (DRISDOL) 50000 units CAPS capsule Take 50,000 Units by mouth every Wednesday.     [provider]      Allergies    Augmentin [amoxicillin-pot clavulanate], Betadine [povidone iodine], Contrast media [iodinated contrast media], Morphine and codeine, Prednisone, Yellow jacket venom [bee venom], Azithromycin, Erythromycin, Iohexol, Oxycontin [oxycodone], Avelox [moxifloxacin hcl in nacl], Codeine, Gadolinium derivatives, Iodine, Lamisil [terbinafine], Levaquin [levofloxacin in d5w], Oxycodone-acetaminophen, Pantoprazole, Praluent [alirocumab], Pravastatin, Rosuvastatin, Shellfish allergy, Dilaudid [hydromorphone hcl], Oxycodone hcl, and Tramadol    Review of Systems   Review of Systems  All other systems reviewed and are negative.   Physical Exam Updated  Vital Signs BP 136/76 (BP Location: Right Arm)   Pulse 81   Temp 98.2 F (36.8 C) (Oral)   Resp 17   Ht 5\' 5"  (1.651 m)   Wt 104.5 kg   SpO2 98%   BMI 38.34 kg/m  Physical Exam Vitals and nursing note reviewed.  Constitutional:      General: She is not in acute distress.    Appearance: Normal appearance. She is well-developed.  HENT:     Head: Normocephalic and  atraumatic.  Eyes:     Conjunctiva/sclera: Conjunctivae normal.     Pupils: Pupils are equal, round, and reactive to light.  Cardiovascular:     Rate and Rhythm: Normal rate and regular rhythm.     Heart sounds: Normal heart sounds.  Pulmonary:     Effort: Pulmonary effort is normal. No respiratory distress.     Breath sounds: Normal breath sounds.  Abdominal:     General: There is no distension.     Palpations: Abdomen is soft.     Tenderness: There is no abdominal tenderness.  Musculoskeletal:        General: No deformity. Normal range of motion.     Cervical back: Normal range of motion and neck supple.  Skin:    General: Skin is warm and dry.  Neurological:     General: No focal deficit present.     Mental Status: She is alert and oriented to person, place, and time.     ED Results / Procedures / Treatments   Labs (all labs ordered are listed, but only abnormal results are displayed) Labs Reviewed  COMPREHENSIVE METABOLIC PANEL - Abnormal; Notable for the following components:      Result Value   Potassium 3.3 (*)    Glucose, Bld 107 (*)    Calcium 8.8 (*)    All other components within normal limits  CBC WITH DIFFERENTIAL/PLATELET  I-STAT CHEM 8, ED  TROPONIN I (HIGH SENSITIVITY)  TROPONIN I (HIGH SENSITIVITY)    EKG EKG Interpretation  Date/Time:  Saturday Dec 18 2022 19:38:45 EDT Ventricular Rate:  78 PR Interval:  178 QRS Duration: 84 QT Interval:  402 QTC Calculation: 458 R Axis:   -1 Text Interpretation: Normal sinus rhythm Normal ECG When compared with ECG of 18-Dec-2022 17:11, PREVIOUS ECG IS PRESENT Confirmed by Bethann Berkshire [phone]) on 12/18/2022 7:39:30 PM  Radiology DG Chest 1 View  Result Date: 12/18/2022 CLINICAL DATA:  Shortness of breath. EXAM: CHEST  1 VIEW COMPARISON:  04/14/2021 FINDINGS: The heart size and mediastinal contours are within normal limits. Mild linear opacity is seen in the lateral right lung base which is new, and may be  due to atelectasis or scarring. No evidence of pulmonary consolidation or edema. No evidence of pleural effusion. IMPRESSION: Mild right basilar atelectasis versus scarring. Electronically Signed   By: Danae Orleans M.D.   On: 12/18/2022 18:19    Procedures Procedures    Medications Ordered in ED Medications  sodium chloride 0.9 % bolus 1,000 mL (has no administration in time range)  famotidine (PEPCID) tablet 20 mg (20 mg Oral Given 12/18/22 1742)  methylPREDNISolone sodium succinate (SOLU-MEDROL) 125 mg/2 mL injection 125 mg (125 mg Intramuscular Given 12/18/22 1743)    ED Course/ Medical Decision Making/ A&P                             Medical Decision Making Amount and/or Complexity of Data Reviewed Labs:  ordered. Radiology: ordered.  Risk Prescription drug management.    Medical Screen Complete  This patient presented to the ED with complaint of allergic reaction.  This complaint involves an extensive number of treatment options. The initial differential diagnosis includes, but is not limited to, allergic reaction  This presentation is: Acute, Chronic, Self-Limited, Previously Undiagnosed, Uncertain Prognosis, Complicated, Systemic Symptoms, and Threat to Life/Bodily Function  Patient reports allergic reaction symptoms just prior to arrival.  She took an EpiPen at home with improvement immediately.  EMS did not administer additional medications during transport.  On arrival to the ED the patient feels significantly improved.  Patient with longstanding history of significant allergic reactions in the past.  Screening labs obtained are without significant abnormality.  Patient's potassium is mildly decreased.  Patient's initial troponin is 3.  Patient without recurrent symptoms during ED observation.  Patient offered additional ED observation and/or repeat troponin.  Patient's exam and history are not consistent with likely ACS.  Patient declines additional observation.   She feels comfortable going home.  Patient does have multiple EpiPen's at home.  Patient is agreeable with plan for prednisone orally for the next several days.  She reports that she can take prednisone without difficulty and without allergic reaction.  Importance of close follow-up is stressed.  Strict return precautions given and understood.  Additional history obtained:  Additional history obtained from EMS and Spouse External records from outside sources obtained and reviewed including prior ED visits and prior Inpatient records.    Lab Tests:  I ordered and personally interpreted labs.  The pertinent results include: CBC, CMP, troponin   Imaging Studies ordered:  I ordered imaging studies including chest x-ray I independently visualized and interpreted obtained imaging which showed NAD I agree with the radiologist interpretation.   Cardiac Monitoring:  The patient was maintained on a cardiac monitor.  I personally viewed and interpreted the cardiac monitor which showed an underlying rhythm of: NSR   Medicines ordered:  I ordered medication including sodium nausea, Pepcid for suspected allergic reaction Reevaluation of the patient after these medicines showed that the patient: resolved    Problem List / ED Course:  Suspected allergic reaction   Reevaluation:  After the interventions noted above, I reevaluated the patient and found that they have: improved Disposition:  After consideration of the diagnostic results and the patients response to treatment, I feel that the patent would benefit from close outpatient follow-up.          Final Clinical Impression(s) / ED Diagnoses Final diagnoses:  Allergic reaction, initial encounter    Rx / DC Orders ED Discharge Orders          Ordered    predniSONE (DELTASONE) 10 MG tablet  Daily        12/18/22 2013              Wynetta Fines, MD 12/18/22 2211

## 2022-12-18 NOTE — ED Notes (Signed)
I looked for a vein to get an IV on pt and could not find any. IV consult in

## 2022-12-18 NOTE — ED Provider Triage Note (Signed)
Emergency Medicine Provider Triage Evaluation Note  [name] [name] , a 56 y.o. female  was evaluated in triage.  Pt complains of allergic rxn.  Review of Systems  Positive: Shortness of breath, tingling of lips, tingling of tongue Negative: Focal weakness  Physical Exam  BP 136/76 (BP Location: Right Arm)   Pulse 81   Temp 98.2 F (36.8 C) (Oral)   Resp 17   SpO2 100%  Gen:   Awake, no distress speaking in complete sentences Resp:  Normal effort CTA bilaterally MSK:   Moves extremities without difficulty  Other:  No appreciable rash  Medical Decision Making  Medically screening exam initiated at 5:10 PM.  Appropriate orders placed.  [name] [name] was informed that the remainder of the evaluation will be completed by another provider, this initial triage assessment does not replace that evaluation, and the importance of remaining in the ED until their evaluation is complete.  Patient with extensive history of allergic type reactions.  Patient reports onset of symptoms today.  She administered epinephrine at home prior to EMS transport.  On arrival to the ED the patient feels improved.  Patient with lengthy history of prior allergic type reactions.  Of note, patient is the wife of Dr. Estell Harpin, ED physician.     [name] Fines, MD 12/18/22 [phone]

## 2022-12-18 NOTE — ED Triage Notes (Signed)
Pt arrived via [facility] from home for allergic reaction. Pt states she was given IV dye on Mon for a CT, but was pre-medicated. Pt was seen Tues at [facility] for chest pressure. Pt states she had tingling of the back of her throat so, she gave herself her epi pen. Pt c/o midsternal chest pressure that radiates to mid upper back. Pt states chest pressure increases with inspiration. Pt able to speak in complete sentences. Pt is eupneic.

## 2022-12-18 NOTE — ED Notes (Signed)
Patient verbalizes understanding of discharge instructions. Opportunity for questioning and answers were provided. Armband removed by staff, pt discharged from ED. Ambulated out to lobby, husband to take home

## 2022-12-20 NOTE — Telephone Encounter (Signed)
Called patient. No answer. Detailed message left to CB and set up lab appt. Will mail future appts as well.

## 2022-12-21 ENCOUNTER — Other Ambulatory Visit: Payer: Self-pay

## 2022-12-21 DIAGNOSIS — I824Y1 Acute embolism and thrombosis of unspecified deep veins of right proximal lower extremity: Secondary | ICD-10-CM

## 2022-12-22 ENCOUNTER — Inpatient Hospital Stay: Payer: PRIVATE HEALTH INSURANCE

## 2022-12-22 DIAGNOSIS — Z90710 Acquired absence of both cervix and uterus: Secondary | ICD-10-CM | POA: Diagnosis not present

## 2022-12-22 DIAGNOSIS — Z7901 Long term (current) use of anticoagulants: Secondary | ICD-10-CM | POA: Insufficient documentation

## 2022-12-22 DIAGNOSIS — Z8249 Family history of ischemic heart disease and other diseases of the circulatory system: Secondary | ICD-10-CM | POA: Insufficient documentation

## 2022-12-22 DIAGNOSIS — Z841 Family history of disorders of kidney and ureter: Secondary | ICD-10-CM | POA: Diagnosis not present

## 2022-12-22 DIAGNOSIS — Z818 Family history of other mental and behavioral disorders: Secondary | ICD-10-CM | POA: Insufficient documentation

## 2022-12-22 DIAGNOSIS — M7120 Synovial cyst of popliteal space [Baker], unspecified knee: Secondary | ICD-10-CM | POA: Insufficient documentation

## 2022-12-22 DIAGNOSIS — Z883 Allergy status to other anti-infective agents status: Secondary | ICD-10-CM | POA: Diagnosis not present

## 2022-12-22 DIAGNOSIS — I824Y1 Acute embolism and thrombosis of unspecified deep veins of right proximal lower extremity: Secondary | ICD-10-CM

## 2022-12-22 DIAGNOSIS — Z79899 Other long term (current) drug therapy: Secondary | ICD-10-CM | POA: Diagnosis not present

## 2022-12-22 DIAGNOSIS — M25469 Effusion, unspecified knee: Secondary | ICD-10-CM | POA: Diagnosis not present

## 2022-12-22 DIAGNOSIS — Z881 Allergy status to other antibiotic agents status: Secondary | ICD-10-CM | POA: Diagnosis not present

## 2022-12-22 DIAGNOSIS — Z9049 Acquired absence of other specified parts of digestive tract: Secondary | ICD-10-CM | POA: Diagnosis not present

## 2022-12-22 DIAGNOSIS — M25561 Pain in right knee: Secondary | ICD-10-CM | POA: Diagnosis not present

## 2022-12-22 DIAGNOSIS — R0602 Shortness of breath: Secondary | ICD-10-CM | POA: Diagnosis not present

## 2022-12-22 DIAGNOSIS — Z8379 Family history of other diseases of the digestive system: Secondary | ICD-10-CM | POA: Diagnosis not present

## 2022-12-22 DIAGNOSIS — Z823 Family history of stroke: Secondary | ICD-10-CM | POA: Diagnosis not present

## 2022-12-22 DIAGNOSIS — Z811 Family history of alcohol abuse and dependence: Secondary | ICD-10-CM | POA: Insufficient documentation

## 2022-12-22 DIAGNOSIS — Z888 Allergy status to other drugs, medicaments and biological substances status: Secondary | ICD-10-CM | POA: Insufficient documentation

## 2022-12-22 DIAGNOSIS — Z90722 Acquired absence of ovaries, bilateral: Secondary | ICD-10-CM | POA: Insufficient documentation

## 2022-12-22 DIAGNOSIS — R079 Chest pain, unspecified: Secondary | ICD-10-CM | POA: Insufficient documentation

## 2022-12-22 DIAGNOSIS — Z885 Allergy status to narcotic agent status: Secondary | ICD-10-CM | POA: Diagnosis not present

## 2022-12-22 DIAGNOSIS — M7989 Other specified soft tissue disorders: Secondary | ICD-10-CM | POA: Insufficient documentation

## 2022-12-22 DIAGNOSIS — Z880 Allergy status to penicillin: Secondary | ICD-10-CM | POA: Insufficient documentation

## 2022-12-23 ENCOUNTER — Ambulatory Visit: Payer: PRIVATE HEALTH INSURANCE | Admitting: Podiatry

## 2022-12-24 LAB — ANTIPHOSPHOLIPID SYNDROME PROF
Anticardiolipin IgG: <9 GPL U/mL (ref 0–14)
Anticardiolipin IgM: <9 MPL U/mL (ref 0–12)
DRVVT: 29.8 s (ref 0.0–47.0)
PTT Lupus Anticoagulant: 26.8 s (ref 0.0–43.5)

## 2022-12-24 LAB — BETA-2-GLYCOPROTEIN I ABS, IGG/M/A
Beta-2 Glyco I IgG: <9 GPI IgG units (ref 0–20)
Beta-2-Glycoprotein I IgA: <9 GPI IgA units (ref 0–25)
Beta-2-Glycoprotein I IgM: <9 GPI IgM units (ref 0–32)

## 2022-12-28 ENCOUNTER — Ambulatory Visit: Payer: PRIVATE HEALTH INSURANCE | Admitting: Podiatry

## 2022-12-28 ENCOUNTER — Encounter: Payer: Self-pay | Admitting: Oncology

## 2022-12-30 ENCOUNTER — Encounter: Payer: Self-pay | Admitting: Vascular Surgery

## 2022-12-30 ENCOUNTER — Ambulatory Visit: Payer: PRIVATE HEALTH INSURANCE | Admitting: Vascular Surgery

## 2022-12-30 VITALS — BP 121/82 | HR 94 | Temp 97.9°F | Resp 20 | Ht 65.0 in | Wt 239.0 lb

## 2022-12-30 DIAGNOSIS — Z09 Encounter for follow-up examination after completed treatment for conditions other than malignant neoplasm: Secondary | ICD-10-CM

## 2022-12-30 DIAGNOSIS — Z86718 Personal history of other venous thrombosis and embolism: Secondary | ICD-10-CM

## 2022-12-30 NOTE — Progress Notes (Signed)
ASSESSMENT & PLAN   RIGHT LOWER EXTREMITY DVT: This patient had a partially occlusive DVT in the femoral vein on the right that was diagnosed on 11/24/2022.  She has undergone a hypercoagulable workup that initially has been unremarkable.  His lab results are noted below.  She is seeing hematology and is going to undergo more extensive workup once she is off anticoagulation.  She has been elevating her leg.  She is going to ease back into exercising and we will also get her fitted for a knee-high compression stocking with a gradient of 15 to 20 mmHg.  Currently she has minimal leg swelling.  I would agree with continuing her anticoagulation until her follow-up study in August. We have discussed the importance of leg elevation and compression therapy.  I think she can resume exercise at this point.  I will be happy to see her back at any time if her swelling worsens or she develops any new vascular symptoms.  REASON FOR CONSULT:    Right lower extremity DVT involving femoral vein and calf veins.  The consult is requested by Dr. Martha Clan.  HPI:   [name] [name] is a 57 y.o. female who was diagnosed with a right lower extremity DVT on 11/24/2022.  She had been on a long trip to [place] in early April and later began having some right knee pain in late April.  She had a venous duplex scan on 11/24/2022.  Those results are discussed below.  She was started on Eliquis at that time.  She has no previous history of DVT.  She has been elevating her leg.  She currently has not been using compression.  She did have some recent shortness of breath which prompted a CT angio which did not show any evidence of PE.  Past Medical History:  Diagnosis Date   Anemia    many years ago   Anxiety    situational   Arthritis    "hands" (07/12/2012)   Asthma    seasonal    Chest pain    Chest pain at rest, on going 07/12/2012   Dercum's disease    DVT (deep venous thrombosis) (HCC)    Eczema     Family history of early CAD 07/12/2012   Fibromyalgia    GERD (gastroesophageal reflux disease)    Headache(784.0)    "often; not daily" (07/12/2012)   Hypertension    not on medications   Kidney stones    Migraine    Pneumonia    as a child   PONV (postoperative nausea and vomiting)    she states she gets very sick    Family History  Problem Relation Age of Onset   Hiatal hernia Mother    Hypertension Mother    Depression Mother    Allergic rhinitis Mother    Heart disease Father    Stroke Maternal Grandfather    Chronic Renal Failure Paternal Grandmother    Drug abuse Son    Allergic rhinitis Brother    Allergic rhinitis Maternal Aunt     SOCIAL HISTORY: Social History   Tobacco Use   Smoking status: Never   Smokeless tobacco: Never  Substance Use Topics   Alcohol use: No    Allergies  Allergen Reactions   Augmentin [Amoxicillin-Pot Clavulanate] Anaphylaxis and Nausea And Vomiting   Betadine [Povidone Iodine]     Throat swelling, itchy lips, redness/swelling at application site.   Contrast Media [Iodinated Contrast Media] Anaphylaxis   Morphine And  Codeine Anaphylaxis and Nausea And Vomiting   Prednisone Anaphylaxis   Yellow Jacket Venom [Bee Venom] Anaphylaxis   Azithromycin Other (See Comments)    SEVERE STOMACH PAIN    Erythromycin Nausea And Vomiting and Other (See Comments)    SEVERE STOMACH PAIN/abdominal pain    Iohexol Hives, Itching and Swelling    Swelling of upper lip, thick tongue, hives on face and back and itching all over   Oxycontin [Oxycodone] Nausea And Vomiting   Avelox [Moxifloxacin Hcl In Nacl] Swelling   Codeine Nausea And Vomiting and Other (See Comments)    Headaches also   Gadolinium Derivatives Hives, Itching and Swelling   Iodine    Lamisil [Terbinafine]     Pt stated, "It made me feel like I had the flu"   Levaquin [Levofloxacin In D5w] Swelling   Oxycodone-Acetaminophen Nausea And Vomiting   Pantoprazole Other (See  Comments)    "Feels like I have the flu"   Praluent [Alirocumab]     Swelling in leg, pain, dizziness   Pravastatin     myalgias   Rosuvastatin     Myalgias and cramps, sore throat, indigestion   Shellfish Allergy    Dilaudid [Hydromorphone Hcl] Nausea And Vomiting   Oxycodone Hcl Nausea And Vomiting   Tramadol Nausea Only    Current Outpatient Medications  Medication Sig Dispense Refill   acetaminophen (TYLENOL) 500 MG tablet Take 500 mg by mouth every 6 (six) hours as needed for mild pain.     apixaban (ELIQUIS) 5 MG TABS tablet Take 5 mg by mouth 2 (two) times daily.     EPINEPHrine (EPIPEN 2-PAK) 0.3 mg/0.3 mL IJ SOAJ injection Inject 0.3 mg into the muscle as needed. 1 each 1   ezetimibe (ZETIA) 10 MG tablet Take 1 tablet (10 mg total) by mouth daily. 90 tablet 3   icosapent Ethyl (VASCEPA) 1 g capsule Take 2 capsules (2 g total) by mouth 2 (two) times daily. 360 capsule 0   ipratropium (ATROVENT) 0.03 % nasal spray Place 1-2 sprays into both nostrils 2 (two) times daily as needed (nasal drainage). 30 mL 5   ketoconazole (NIZORAL) 2 % cream Apply 1 Application topically daily. 60 g 2   metroNIDAZOLE (METROGEL) 0.75 % gel metronidazole 0.75 % topical gel  APPLY ON THE SKIN DAILY FOR ROSACEA     olmesartan (BENICAR) 20 MG tablet Take 20 mg by mouth daily.     ondansetron (ZOFRAN-ODT) 4 MG disintegrating tablet Take by mouth.     predniSONE (DELTASONE) 10 MG tablet Take 4 tablets (40 mg total) by mouth daily. 16 tablet 0   RABEprazole (ACIPHEX) 20 MG tablet Take 20 mg by mouth 2 (two) times daily.     rizatriptan (MAXALT-MLT) 10 MG disintegrating tablet Take 10 mg by mouth every 2 (two) hours as needed for migraine.   11   rosuvastatin (CRESTOR) 5 MG tablet Take 1 tablet (5 mg total) by mouth daily. 90 tablet 3   scopolamine (TRANSDERM-SCOP) 1 MG/3DAYS Place 1 patch (1.5 mg total) onto the skin every 3 (three) days. 10 patch 1   Vitamin D, Ergocalciferol, (DRISDOL) 50000 units CAPS  capsule Take 50,000 Units by mouth every Wednesday.      No current facility-administered medications for this visit.    REVIEW OF SYSTEMS:  [X]  denotes positive finding, [ ]  denotes negative finding Cardiac  Comments:  Chest pain or chest pressure: x   Shortness of breath upon exertion:    Short of  breath when lying flat:    Irregular heart rhythm:        Vascular    Pain in calf, thigh, or hip brought on by ambulation: x   Pain in feet at night that wakes you up from your sleep:     Blood clot in your veins:    Leg swelling:  x       Pulmonary    Oxygen at home:    Productive cough:     Wheezing:         Neurologic    Sudden weakness in arms or legs:     Sudden numbness in arms or legs:     Sudden onset of difficulty speaking or slurred speech:    Temporary loss of vision in one eye:     Problems with dizziness:         Gastrointestinal    Blood in stool:     Vomited blood:         Genitourinary    Burning when urinating:     Blood in urine:        Psychiatric    Major depression:         Hematologic    Bleeding problems:    Problems with blood clotting too easily:        Skin    Rashes or ulcers:        Constitutional    Fever or chills:    -  PHYSICAL EXAM:   Vitals:   12/30/22 1427  BP: 121/82  Pulse: 94  Resp: 20  Temp: 97.9 F (36.6 C)  SpO2: 96%  Weight: 239 lb (108.4 kg)  Height: 5\' 5"  (1.651 m)   Body mass index is 39.77 kg/m. GENERAL: The patient is a well-nourished female, in no acute distress. The vital signs are documented above. CARDIAC: There is a regular rate and rhythm.  VASCULAR: I do not detect carotid bruits. She has palpable pedal pulses. She has mild bilateral lower extremity swelling. PULMONARY: There is good air exchange bilaterally without wheezing or rales. ABDOMEN: Soft and non-tender with normal pitched bowel sounds.  MUSCULOSKELETAL: There are no major deformities. NEUROLOGIC: No focal weakness or paresthesias  are detected. SKIN: There are no ulcers or rashes noted. PSYCHIATRIC: The patient has a normal affect.  DATA:    VENOUS DUPLEX: I have reviewed the venous duplex scan that was done on 11/24/2022.  The patient had presented with swelling and redness in the right calf for 1 month.  Venous duplex scan showed a DVT involving the right femoral vein in the midportion.  This was a nonocclusive DVT.  There was also clot in the anterior tibial and posterior tibial veins.  Waverly Ferrari [facility]

## 2022-12-31 LAB — PROTHROMBIN GENE MUTATION

## 2023-01-03 ENCOUNTER — Ambulatory Visit (INDEPENDENT_AMBULATORY_CARE_PROVIDER_SITE_OTHER): Payer: PRIVATE HEALTH INSURANCE | Admitting: Podiatry

## 2023-01-03 DIAGNOSIS — L603 Nail dystrophy: Secondary | ICD-10-CM | POA: Diagnosis not present

## 2023-01-03 DIAGNOSIS — L600 Ingrowing nail: Secondary | ICD-10-CM

## 2023-01-03 NOTE — Progress Notes (Signed)
Subjective: Chief Complaint  Patient presents with   Nail Problem    Nail trim     57 year old female presents the office today for concerns of her nail started to get ingrown again and the nails are not even.  No swelling, redness or any drainage.  She is on Eliquis for recent blood clot  Objective: AAO x3, NAD DP/PT pulses palpable bilaterally, CRT less than 3 seconds Nails are mildly incurvated along the nail borders of the right hallux the most noticeable.  There is some persistent discoloration of the distal portion of the nail more yellow, brown discoloration.  No hyperpigmentation.  No edema, erythema or signs of infection.   No pain with calf compression, swelling, warmth, erythema  Assessment: 57 year old female with ingrown toenails, without signs of infection; chronic anticoagulation  Plan: -All treatment options discussed with the patient including all alternatives, risks, complications.  -I went ahead and debrided the nails to the any complications or bleeding.  Continue routine debridement.  Vivi Barrack DPM

## 2023-01-04 ENCOUNTER — Ambulatory Visit: Payer: PRIVATE HEALTH INSURANCE | Admitting: Podiatry

## 2023-01-07 DIAGNOSIS — M7989 Other specified soft tissue disorders: Secondary | ICD-10-CM

## 2023-01-11 LAB — FACTOR 5 LEIDEN

## 2023-01-14 IMAGING — DX DG CHEST 2V
2 series · 2 of 2 positions shown · non-contrast
Comparison: Chest x-ray 11/01/2018.

CLINICAL DATA: Chest pain.

EXAM:
CHEST - 2 VIEW

[chest pa]
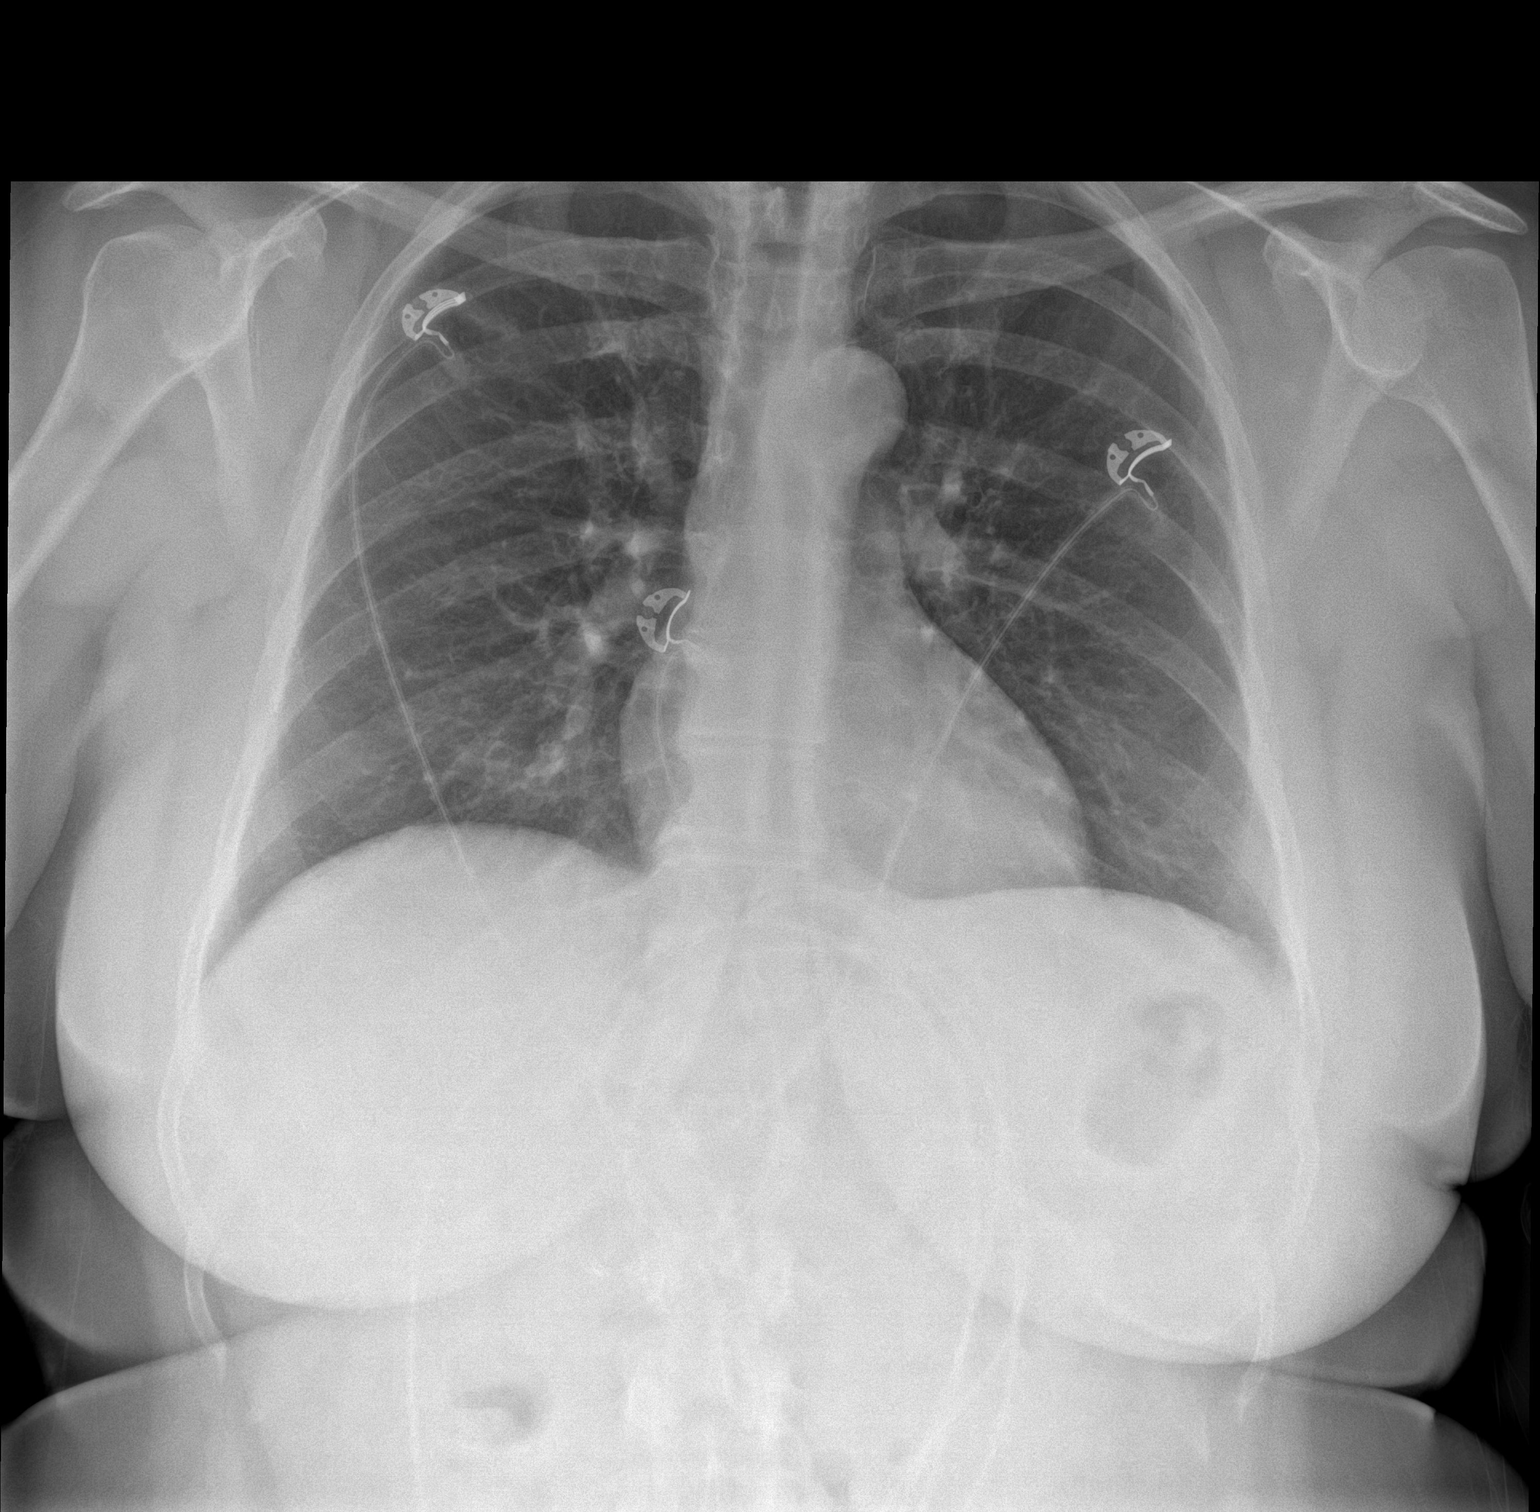

[chest lat]
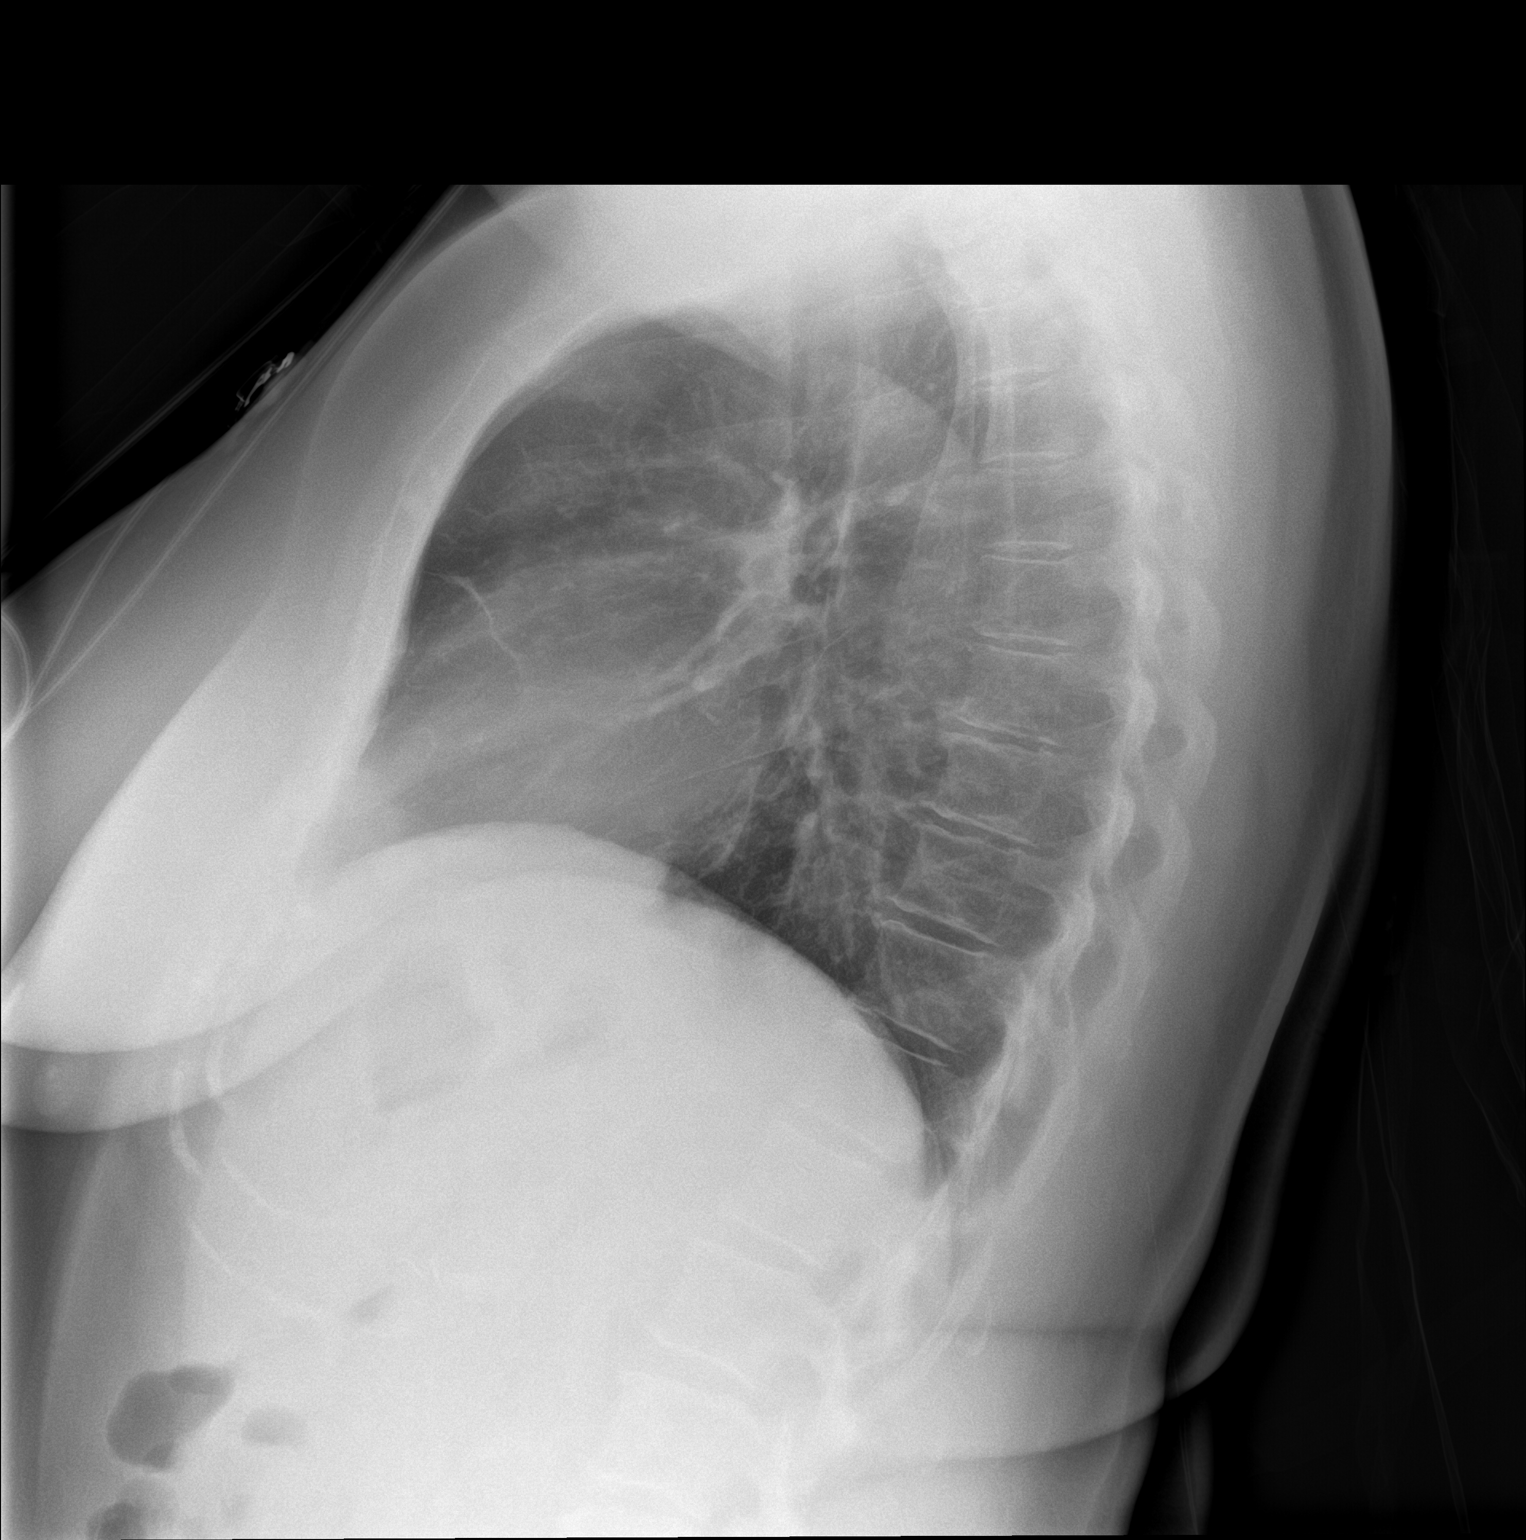

[2 of 2 positions shown; findings below may reference images not displayed]

FINDINGS: The heart size and mediastinal contours are within normal limits.
Both lungs are clear. The visualized skeletal structures are
unremarkable.
IMPRESSION: No active cardiopulmonary disease.

## 2023-02-14 ENCOUNTER — Telehealth: Payer: Self-pay | Admitting: *Deleted

## 2023-02-14 ENCOUNTER — Other Ambulatory Visit: Payer: Self-pay

## 2023-02-14 ENCOUNTER — Ambulatory Visit (INDEPENDENT_AMBULATORY_CARE_PROVIDER_SITE_OTHER): Payer: PRIVATE HEALTH INSURANCE | Admitting: Podiatry

## 2023-02-14 DIAGNOSIS — M7661 Achilles tendinitis, right leg: Secondary | ICD-10-CM

## 2023-02-14 DIAGNOSIS — L603 Nail dystrophy: Secondary | ICD-10-CM

## 2023-02-14 DIAGNOSIS — M7662 Achilles tendinitis, left leg: Secondary | ICD-10-CM | POA: Diagnosis not present

## 2023-02-14 DIAGNOSIS — L600 Ingrowing nail: Secondary | ICD-10-CM | POA: Diagnosis not present

## 2023-02-14 MED ORDER — EZETIMIBE 10 MG PO TABS: 10.0000 mg | ORAL_TABLET | Freq: Every day | ORAL | 0 refills | Status: DC

## 2023-02-14 MED ORDER — EPINEPHRINE 0.3 MG/0.3ML IJ SOAJ: 0.3000 mg | INTRAMUSCULAR | 1 refills | Status: DC | PRN

## 2023-02-14 MED ORDER — ICOSAPENT ETHYL 1 G PO CAPS: 2.0000 g | ORAL_CAPSULE | Freq: Two times a day (BID) | ORAL | 0 refills | Status: DC

## 2023-02-14 MED ORDER — KETOCONAZOLE 2 % EX CREA: 1.0000 | TOPICAL_CREAM | Freq: Every day | CUTANEOUS | 0 refills | Status: AC

## 2023-02-14 MED ORDER — LEVOCETIRIZINE DIHYDROCHLORIDE 5 MG PO TABS: 5.0000 mg | ORAL_TABLET | Freq: Every evening | ORAL | 5 refills | Status: DC

## 2023-02-14 MED ORDER — ROSUVASTATIN CALCIUM 5 MG PO TABS: 5.0000 mg | ORAL_TABLET | Freq: Every day | ORAL | 0 refills | Status: DC

## 2023-02-14 NOTE — Progress Notes (Signed)
Subjective: Chief Complaint  Patient presents with   Nail Problem    Patient came in today for routine foot care nail trim, patient is having some itching and burning between the toes started a week ago      57 year old female presents the office today for concerns of her nail started to get ingrown, dystrophy of the toenail.  No redness or drainage.  She also has a knot on the left Achilles that she has had for some time she is been going to physical therapy.  She also has done EPAT in the past. She also follows with orthopedics for this.   She is on Eliquis for recent blood clot  Objective: AAO x3, NAD DP/PT pulses palpable bilaterally, CRT less than 3 seconds Nails are mildly incurvated along the nail borders of the right hallux the most noticeable.  There is some persistent discoloration of the distal portion of the nail more yellow, brown discoloration.  No hyperpigmentation.  No edema, erythema or signs of infection.  No significant pain on exam The posterior aspect left Achilles tendon there is a nodule present on the mid substance of the Achilles tendon however clinically the tendon appears to be intact.  Tenderness palpation focally on this area. No pain with calf compression, swelling, warmth, erythema  Assessment: 57 year old female with ingrown toenails, without signs of infection; chronic anticoagulation  Plan: -All treatment options discussed with the patient including all alternatives, risks, complications.  -I went ahead and debrided the nails to the any complications or bleeding.  Continue routine debridement. -For the Achilles she is still doing physical therapy.  Consider adding dry needling.  Continue shoes, good arch support.  Discussed stretching icing on regular basis.  No improvement consider advanced imaging.  Vivi Barrack DPM

## 2023-02-14 NOTE — Telephone Encounter (Signed)
[name] [name],  Please call patient back and ask her if she got her bloodwork for the stinging insect redrawn?  I want her to get the bloodwork drawn that I ordered at the last visit and make a follow up visit with me 2 weeks after the bloodwork is drawn.   She has stopped injections multiple times and we have never reached maintenance. Per office protocol, we don't restart if you stopped 2 times before. This would be somewhere around her second lapse.

## 2023-02-14 NOTE — Telephone Encounter (Signed)
Patient called and stated that she would like to restart venom injections. She states that she has had some health issues these past few months that have kept her from coming in to get her injections. She seemed really firm that was the reason she hasn't been coming. I advised I would have to speak with Dr. Selena Batten and would call her back and let her know. I had to inform her two more times that I would let you know and would call her back with further information. Patient verbalized understanding.

## 2023-02-17 NOTE — Telephone Encounter (Signed)
I called and spoke with the patient in regards to labs and a follow up visit and resuming her venom injections. She states that she has had a lot of recent issues that have prevented her from resuming her injections and states that every other time in the past when she had issues preventing her from being consistent with injections it was "approved" but our office for her to resume. I advised to get her lab results and keep her appointment for August 13th with Dr. Selena Batten in [facility] and we will discuss things further at that time. Patient verbalized understanding.

## 2023-02-21 ENCOUNTER — Telehealth: Payer: Self-pay | Admitting: Cardiology

## 2023-02-21 LAB — ALLERGENS W/TOTAL IGE AREA 2
Alternaria Alternata IgE: <0.1 kU/L
Aspergillus Fumigatus IgE: <0.1 kU/L
Bermuda Grass IgE: 1.54 kU/L — AB
Cat Dander IgE: <0.1 kU/L
Cedar, Mountain IgE: <0.1 kU/L
Cladosporium Herbarum IgE: <0.1 kU/L
Cockroach, German IgE: <0.1 kU/L
Common Silver Birch IgE: <0.1 kU/L
Cottonwood IgE: <0.1 kU/L
D Farinae IgE: <0.1 kU/L
D Pteronyssinus IgE: <0.1 kU/L
Dog Dander IgE: <0.1 kU/L
Elm, American IgE: <0.1 kU/L
IgE (Immunoglobulin E), Serum: 75 IU/mL (ref 6–495)
Johnson Grass IgE: 1.67 kU/L — AB
Maple/Box Elder IgE: <0.1 kU/L
Mouse Urine IgE: <0.1 kU/L
Oak, White IgE: <0.1 kU/L
Pecan, Hickory IgE: <0.1 kU/L
Penicillium Chrysogen IgE: <0.1 kU/L
Pigweed, Rough IgE: <0.1 kU/L
Ragweed, Short IgE: <0.1 kU/L
Sheep Sorrel IgE Qn: <0.1 kU/L
Timothy Grass IgE: 3.79 kU/L — AB
White Mulberry IgE: <0.1 kU/L

## 2023-02-21 LAB — TRYPTASE: Tryptase: 2.6 ug/L (ref 2.2–13.2)

## 2023-02-21 LAB — ALLERGEN FIRE ANT: I070-IgE Fire Ant (Invicta): 0.14 kU/L — AB

## 2023-02-21 LAB — ALLERGEN HYMENOPTERA PANEL
Bumblebee: 0.15 kU/L — AB
Honeybee IgE: 0.11 kU/L — AB
Hornet, White Face, IgE: 1.11 kU/L — AB
Hornet, Yellow, IgE: 0.9 kU/L — AB
Paper Wasp IgE: 1.65 kU/L — AB
Yellow Jacket, IgE: 2.37 kU/L — AB

## 2023-02-21 LAB — ALLERGEN SCALLOPS F338: Scallop IgE: <0.1 kU/L

## 2023-02-21 MED ORDER — ICOSAPENT ETHYL 1 G PO CAPS: 2.0000 g | ORAL_CAPSULE | Freq: Two times a day (BID) | ORAL | 3 refills | Status: DC

## 2023-02-21 MED ORDER — ROSUVASTATIN CALCIUM 5 MG PO TABS: 5.0000 mg | ORAL_TABLET | Freq: Every day | ORAL | 3 refills | Status: DC

## 2023-02-21 MED ORDER — EZETIMIBE 10 MG PO TABS: 10.0000 mg | ORAL_TABLET | Freq: Every day | ORAL | 3 refills | Status: DC

## 2023-02-21 NOTE — Telephone Encounter (Signed)
Patient is requesting call back to discuss future appts and medication management. She states she would like to speak with pharmacist, Margaretmary Dys, RPH-CPP. She states she is uncertain on doctor's and scheduling and would like a call back to discuss medication before she runs out. Requested Megan call her back as soon as she can.

## 2023-02-21 NOTE — Telephone Encounter (Signed)
I previously saw pt in lipid clinic back in 2021 and have followed her over the phone for a year or so after to manage her cholesterol given multiple prior intolerances (25 allergies listed in Epic). 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   Prior lipid intolerances include: pravastatin 20mg  daily, rosuvastatin 10mg  daily, and Praluent.  Currently taking: rosuvastatin 5mg  daily, ezetimibe 10mg  daily, and Vascepa 2g BID.  LDL goal < 70 given history of chest pain, elevated Lp(a), and family history of premature CAD.  Most recent lipids scanned in from 02/16/22 with excellent control: LDL 41, TG 127, nonHDL 66, HDL 81, TC 147.  Returned call to pt. Advised she does not need to see me, can see cardiologist annually. She will decide who she wishes to follow with and schedule appt. 10 minutes spent on the phone with pt.

## 2023-02-25 ENCOUNTER — Telehealth: Payer: Self-pay | Admitting: *Deleted

## 2023-02-25 NOTE — Telephone Encounter (Signed)
Patient needs advice a soon as possible about a possible error in her administration of apixaban today.

## 2023-02-25 NOTE — Telephone Encounter (Signed)
Dr. Cathie Hoops. I spoke patient. She was concerned that she possibly missed this am's dose of eliquis. She can't remember if she took it or not. She voices that she was stressed that she may get a blood clot by possibly missing one dose. I discussed that at this time do not double up on her potential missed dose. I asked her to take one tablet this evening and then resume her normal schedule tomorrow. Pt stated that her husband is an ER provider and her husband has highly stressed the importance of not missing any dosing of the Eliquis. Discussed at this time, she can not make up any missed dosing. She agreed; she voiced being anxious over her risk for clots. She thanked me for actively listening to her concerns.

## 2023-03-07 ENCOUNTER — Ambulatory Visit (HOSPITAL_BASED_OUTPATIENT_CLINIC_OR_DEPARTMENT_OTHER)
Admission: RE | Admit: 2023-03-07 | Discharge: 2023-03-07 | Disposition: A | Discharge status: Home / Self Care | Payer: PRIVATE HEALTH INSURANCE | Source: Ambulatory Visit | Attending: Oncology | Admitting: Oncology

## 2023-03-07 DIAGNOSIS — I824Y1 Acute embolism and thrombosis of unspecified deep veins of right proximal lower extremity: Secondary | ICD-10-CM | POA: Insufficient documentation

## 2023-03-15 ENCOUNTER — Ambulatory Visit: Payer: PRIVATE HEALTH INSURANCE | Admitting: Allergy

## 2023-03-16 NOTE — Progress Notes (Unsigned)
Follow Up Note  RE: [name] [name] MRN: [id number] DOB: Apr 03, 1966 Date of Office Visit: 03/17/2023  Referring provider: Cleatis Polka., MD Primary care provider: Cleatis Polka., MD  Chief Complaint: No chief complaint on file.  History of Present Illness: I had the pleasure of seeing [name] [name] for a follow up visit at the [facility] on 03/16/2023. She is a 57 y.o. female, who is being followed for hymenoptera allergy, allergic rhinitis, allergic reactions, multiple drug allergies. Her previous allergy office visit was on 09/28/2022 with Dr. Selena Batten. Today is a regular follow up visit.  Environmental panel only positive to grass pollen. Hymenoptera panel positive to white face hornet, yellow jacket, wasp and yellow hornet. Recommend mixed vespid (white face hornet, yellow jacket, and yellow hornet) and wasp injections. If you are interested in re-starting make a follow up visit appointment first to further discuss. The numbers looks better than before. Fire ant was borderline positive.  Hymenoptera allergy Past history - Concern regarding hymenoptera allergy as she developed some respiratory symptoms after getting stung by a yellow jacket. 2020 Hymenoptera panel was positive to honeybee, white faced hornet, yellow jacket, wasp, yellow hornet and bumblebee. Borderline to fire ant. Interim history - used Epipen in June 2023 after an ant sting. Still building up on VIT. No issues now.  Continue mixed vespid injections (contains yellow jacket, yellow hornet and white faced hornet) - given today.  Make sure you take your allergy medication the day of injection. If you do well with the build up then will add on honey bee and wasp together. Carry Epipen and use for anaphylactic reactions as needed.  Get bloodwork to see if any of the levels changed since 2020.   Other allergic rhinitis Past history - Perennial rhino conjunctivitis symptoms for many  years with worsening in the fall. Patient was on AIT for 3 years but the last year developed large localized reactions so she stopped the injections. She believes they were helping her symptoms. 2020 immunocap borderline positive to few pollens. Azelastine not effective.  Interim history - saw ENT no major anatomical issues. Still having PND, rhinorrhea, congestion.  Start Ryaltris (olopatadine + mometasone nasal spray combination) 1-2 sprays per nostril twice a day. Sample given. This replaces your other nasal sprays. Let me know if this works as well.  Take carbinoxamine 4mg  to 6mg  1-2 times a day as needed. This will replace the other antihistamines.  Nasal saline spray (i.e., Simply Saline) or nasal saline lavage (i.e., NeilMed) is recommended as needed and prior to medicated nasal sprays. May use olopatadine eye drops 0.2% once a day as needed for itchy/watery eyes Continue environmental control measures to pollen.    Allergic reaction Past history - 57 year old female who presents with a myriad of symptoms but concerned about increasing reactions to various things including foods, chemicals, environment, drugs since her diagnosis of Dercum's disease. Bloodwork - hymenoptera panel was positive to honeybee, white faced hornet, yellow jacket, wasp, yellow hornet and bumblebee and fire ant. Shellfish panel and seafood panel were all negative. The scallop and oyster were borderline positive. Tryptase, blood count, urticaria index, and alpha gal were all normal. 2,3 Dinor-11Beta-Prostaglandin F2 Alpha, Urine - 2157pg/mg Cr (ref <5205pg/mg Cr) - normal.  Interim history - used Epi in June 2023 after an ant bite. Keep track of reactions. If you have a reaction next time, please get tryptase level drawn within 2-3 hours. This will  help in identifying if it's truly an allergic reaction or some other type of adverse reaction that you are having.  For mild symptoms you can take over the counter  antihistamines such as Benadryl and monitor symptoms closely. If symptoms worsen or if you have severe symptoms including breathing issues, throat closure, significant swelling, whole body hives, severe diarrhea and vomiting, lightheadedness then inject epinephrine and seek immediate medical care afterwards. Emergency action plan in place.  Continue to avoid scallops and red dye.    Multiple drug allergies Past history - Patient is sensitive to various medications. Some of the reactions are more adverse drug reactions rather than IgE mediated reactions. Continue to avoid medications which gave her issues in the past. See allergy list.    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 Continue follow up with dermatology, neurology, cardiology.   Return in about 6 months (around 03/29/2023).  Assessment and Plan: [name] [name] is a 57 y.o. female with: ***  No follow-ups on file.  No orders of the defined types were placed in this encounter.  Lab Orders  No laboratory test(s) ordered today    Diagnostics: Spirometry:  Tracings reviewed. Her effort: {Blank single:19197::"Good reproducible efforts.","It was hard to get consistent efforts and there is a question as to whether this reflects a maximal maneuver.","Poor effort, data can not be interpreted."} FVC: ***L FEV1: ***L, ***% predicted FEV1/FVC ratio: ***% Interpretation: {Blank single:19197::"Spirometry consistent with mild obstructive disease","Spirometry consistent with moderate obstructive disease","Spirometry consistent with severe obstructive disease","Spirometry consistent with possible restrictive disease","Spirometry consistent with mixed obstructive and restrictive disease","Spirometry uninterpretable due to technique","Spirometry consistent with normal pattern","No overt abnormalities noted given today's efforts"}.  Please see scanned spirometry results for  details.  Skin Testing: {Blank single:19197::"Select foods","Environmental allergy panel","Environmental allergy panel and select foods","Food allergy panel","None","Deferred due to recent antihistamines use"}. *** Results discussed with patient/family.   Medication List:  Current Outpatient Medications  Medication Sig Dispense Refill   acetaminophen (TYLENOL) 500 MG tablet Take 500 mg by mouth every 6 (six) hours as needed for mild pain.     apixaban (ELIQUIS) 5 MG TABS tablet Take 5 mg by mouth 2 (two) times daily.     EPINEPHrine (EPIPEN 2-PAK) 0.3 mg/0.3 mL IJ SOAJ injection Inject 0.3 mg into the muscle as needed. 1 each 1   ezetimibe (ZETIA) 10 MG tablet Take 1 tablet (10 mg total) by mouth daily. 90 tablet 3   icosapent Ethyl (VASCEPA) 1 g capsule Take 2 capsules (2 g total) by mouth 2 (two) times daily. 360 capsule 3   ipratropium (ATROVENT) 0.03 % nasal spray Place 1-2 sprays into both nostrils 2 (two) times daily as needed (nasal drainage). 30 mL 5   ketoconazole (NIZORAL) 2 % cream Apply 1 Application topically daily. 60 g 2   ketoconazole (NIZORAL) 2 % cream Apply 1 Application topically daily. 60 g 0   levocetirizine (XYZAL) 5 MG tablet Take 1 tablet (5 mg total) by mouth every evening. 30 tablet 5   metroNIDAZOLE (METROGEL) 0.75 % gel metronidazole 0.75 % topical gel  APPLY ON THE SKIN DAILY FOR ROSACEA     olmesartan (BENICAR) 20 MG tablet Take 20 mg by mouth daily.     ondansetron (ZOFRAN-ODT) 4 MG disintegrating tablet Take by mouth.     predniSONE (DELTASONE) 10 MG tablet Take 4 tablets (40 mg total) by mouth daily. 16 tablet 0   RABEprazole (ACIPHEX) 20 MG tablet Take 20 mg  by mouth 2 (two) times daily.     rizatriptan (MAXALT-MLT) 10 MG disintegrating tablet Take 10 mg by mouth every 2 (two) hours as needed for migraine.   11   rosuvastatin (CRESTOR) 5 MG tablet Take 1 tablet (5 mg total) by mouth daily. 90 tablet 3   scopolamine (TRANSDERM-SCOP) 1 MG/3DAYS Place 1  patch (1.5 mg total) onto the skin every 3 (three) days. 10 patch 1   Vitamin D, Ergocalciferol, (DRISDOL) 50000 units CAPS capsule Take 50,000 Units by mouth every Wednesday.      No current facility-administered medications for this visit.   Allergies: Allergies  Allergen Reactions   Augmentin [Amoxicillin-Pot Clavulanate] Anaphylaxis and Nausea And Vomiting   Betadine [Povidone Iodine]     Throat swelling, itchy lips, redness/swelling at application site.   Contrast Media [Iodinated Contrast Media] Anaphylaxis   Morphine And Codeine Anaphylaxis and Nausea And Vomiting   Prednisone Anaphylaxis   Yellow Jacket Venom [Bee Venom] Anaphylaxis   Azithromycin Other (See Comments)    SEVERE STOMACH PAIN    Erythromycin Nausea And Vomiting and Other (See Comments)    SEVERE STOMACH PAIN/abdominal pain    Iohexol Hives, Itching and Swelling    Swelling of upper lip, thick tongue, hives on face and back and itching all over   Oxycontin [Oxycodone] Nausea And Vomiting   Avelox [Moxifloxacin Hcl In Nacl] Swelling   Codeine Nausea And Vomiting and Other (See Comments)    Headaches also   Gadolinium Derivatives Hives, Itching and Swelling   Iodine    Lamisil [Terbinafine]     Pt stated, "It made me feel like I had the flu"   Levaquin [Levofloxacin In D5w] Swelling   Oxycodone-Acetaminophen Nausea And Vomiting   Pantoprazole Other (See Comments)    "Feels like I have the flu"   Praluent [Alirocumab]     Swelling in leg, pain, dizziness   Pravastatin     Myalgias on 20mg  daily   Rosuvastatin     Myalgias and cramps, sore throat, indigestion on 10mg , tolerates 5mg  ok   Shellfish Allergy     Intolerant to scallops, can eat fish   Dilaudid [Hydromorphone Hcl] Nausea And Vomiting   Oxycodone Hcl Nausea And Vomiting   Tramadol Nausea Only   I reviewed her past medical history, social history, family history, and environmental history and no significant changes have been reported from her  previous visit.  Review of Systems  Constitutional:  Negative for appetite change, chills and fever.  HENT:  Positive for congestion, postnasal drip and rhinorrhea.   Respiratory:  Positive for cough. Negative for chest tightness, shortness of breath and wheezing.   Skin:  Negative for rash.  Allergic/Immunologic: Positive for environmental allergies.    Objective: There were no vitals taken for this visit. There is no height or weight on file to calculate BMI. Physical Exam Vitals and nursing note reviewed.  Constitutional:      Appearance: Normal appearance. She is well-developed.  HENT:     Head: Normocephalic and atraumatic.     Right Ear: Tympanic membrane and external ear normal.     Left Ear: Tympanic membrane and external ear normal.     Nose: Congestion (on right side) present.     Mouth/Throat:     Mouth: Mucous membranes are moist.     Pharynx: Oropharynx is clear.  Eyes:     Conjunctiva/sclera: Conjunctivae normal.  Cardiovascular:     Rate and Rhythm: Normal rate and regular rhythm.  Heart sounds: Normal heart sounds. No murmur heard. Pulmonary:     Effort: Pulmonary effort is normal.     Breath sounds: Normal breath sounds. No wheezing, rhonchi or rales.  Musculoskeletal:     Cervical back: Neck supple.  Skin:    General: Skin is warm.     Findings: No rash.  Neurological:     Mental Status: She is alert and oriented to person, place, and time.  Psychiatric:        Behavior: Behavior normal.    Previous notes and tests were reviewed. The plan was reviewed with the patient/family, and all questions/concerned were addressed.  It was my pleasure to see [name] [name] today and participate in her care. Please feel free to contact me with any questions or concerns.  Sincerely,  Wyline Mood, DO Allergy & Immunology  [facility] office: [phone] [facility] office: [phone]

## 2023-03-17 ENCOUNTER — Encounter: Payer: Self-pay | Admitting: Allergy

## 2023-03-17 ENCOUNTER — Other Ambulatory Visit: Payer: Self-pay

## 2023-03-17 ENCOUNTER — Ambulatory Visit: Payer: PRIVATE HEALTH INSURANCE | Admitting: Allergy

## 2023-03-17 VITALS — BP 126/84 | HR 94 | Temp 98.2°F | Resp 20 | Wt 241.0 lb

## 2023-03-17 DIAGNOSIS — E882 Lipomatosis, not elsewhere classified: Secondary | ICD-10-CM

## 2023-03-17 DIAGNOSIS — T7840XD Allergy, unspecified, subsequent encounter: Secondary | ICD-10-CM

## 2023-03-17 DIAGNOSIS — Z91038 Other insect allergy status: Secondary | ICD-10-CM | POA: Diagnosis not present

## 2023-03-17 DIAGNOSIS — J301 Allergic rhinitis due to pollen: Secondary | ICD-10-CM | POA: Diagnosis not present

## 2023-03-17 DIAGNOSIS — Z889 Allergy status to unspecified drugs, medicaments and biological substances status: Secondary | ICD-10-CM | POA: Diagnosis not present

## 2023-03-17 DIAGNOSIS — I824Y1 Acute embolism and thrombosis of unspecified deep veins of right proximal lower extremity: Secondary | ICD-10-CM

## 2023-03-17 MED ORDER — RYALTRIS 665-25 MCG/ACT NA SUSP: 1.0000 | Freq: Two times a day (BID) | NASAL | 5 refills | Status: DC

## 2023-03-17 NOTE — Patient Instructions (Addendum)
Allergic reaction Consider Xolair injections - this is used sometimes off label for allergic reactions. It is currently approved for food allergies, hives, nasal polyps and asthma.  Given your age, weight and IgE level - 300mg  every 4 weeks. I would most likely try to get it approved through "food allergy" for you.   Continue to avoid scallops and red dye. Keep track of reactions. If you have a reaction next time, please get tryptase level drawn within 2-3 hours. This will help in identifying if it's truly an allergic reaction or some other type of adverse reaction that you are having.  For mild symptoms you can take over the counter antihistamines such as Benadryl 1-2 tablets = 25-50mg  and monitor symptoms closely. If symptoms worsen or if you have severe symptoms including breathing issues, throat closure, significant swelling, whole body hives, severe diarrhea and vomiting, lightheadedness then inject epinephrine and seek immediate medical care afterwards. Emergency action plan in place.    Hymenoptera allergy Restart mixed vespid injections (contains yellow jacket, yellow hornet and white faced hornet) and wasp.  Make sure you take your allergy medication the day of injection. Make sure you bring your Epipen for every visit and wait 30 minutes at the office.  Recommend coming in weekly so we can build up to the maintenance dose where it is the most effective.    Allergic rhinitis Bloodwork was positive to grass pollen. Use Ryaltris (olopatadine + mometasone nasal spray combination) 1-2 sprays per nostril twice a day.  Use over the counter antihistamines such as Zyrtec (cetirizine), Claritin (loratadine), Allegra (fexofenadine), or Xyzal (levocetirizine) daily as needed. May take twice a day during allergy flares. May switch antihistamines every few months. Nasal saline spray (i.e., Simply Saline) or nasal saline lavage (i.e., NeilMed) is recommended as needed and prior to medicated nasal  sprays. May use olopatadine eye drops 0.2% once a day as needed for itchy/watery eyes Continue environmental control measures to pollen.    Multiple drug allergies Continue to avoid medications on allergy list.     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  Return in about 6 months or sooner if needed.   Reducing Pollen Exposure Pollen seasons: trees (spring), grass (summer) and ragweed/weeds (fall). Keep windows closed in your home and car to lower pollen exposure.  Install air conditioning in the bedroom and throughout the house if possible.  Avoid going out in dry windy days - especially early morning. Pollen counts are highest between 5 - 10 AM and on dry, hot and windy days.  Save outside activities for late afternoon or after a heavy rain, when pollen levels are lower.  Avoid mowing of grass if you have grass pollen allergy. Be aware that pollen can also be transported indoors on people and pets.  Dry your clothes in an automatic dryer rather than hanging them outside where they might collect pollen.  Rinse hair and eyes before bedtime.

## 2023-03-18 ENCOUNTER — Inpatient Hospital Stay: Payer: PRIVATE HEALTH INSURANCE | Attending: Oncology

## 2023-03-18 ENCOUNTER — Encounter: Payer: Self-pay | Admitting: Oncology

## 2023-03-18 ENCOUNTER — Inpatient Hospital Stay: Payer: PRIVATE HEALTH INSURANCE | Admitting: Oncology

## 2023-03-18 VITALS — BP 119/85 | HR 71 | Temp 98.1°F | Resp 18 | Wt 241.1 lb

## 2023-03-18 DIAGNOSIS — Z885 Allergy status to narcotic agent status: Secondary | ICD-10-CM | POA: Diagnosis not present

## 2023-03-18 DIAGNOSIS — Z814 Family history of other substance abuse and dependence: Secondary | ICD-10-CM | POA: Insufficient documentation

## 2023-03-18 DIAGNOSIS — G4730 Sleep apnea, unspecified: Secondary | ICD-10-CM | POA: Insufficient documentation

## 2023-03-18 DIAGNOSIS — Z8249 Family history of ischemic heart disease and other diseases of the circulatory system: Secondary | ICD-10-CM | POA: Insufficient documentation

## 2023-03-18 DIAGNOSIS — I824Y1 Acute embolism and thrombosis of unspecified deep veins of right proximal lower extremity: Secondary | ICD-10-CM | POA: Diagnosis not present

## 2023-03-18 DIAGNOSIS — Z888 Allergy status to other drugs, medicaments and biological substances status: Secondary | ICD-10-CM | POA: Diagnosis not present

## 2023-03-18 DIAGNOSIS — Z841 Family history of disorders of kidney and ureter: Secondary | ICD-10-CM | POA: Insufficient documentation

## 2023-03-18 DIAGNOSIS — Z883 Allergy status to other anti-infective agents status: Secondary | ICD-10-CM | POA: Insufficient documentation

## 2023-03-18 DIAGNOSIS — Z90722 Acquired absence of ovaries, bilateral: Secondary | ICD-10-CM | POA: Diagnosis not present

## 2023-03-18 DIAGNOSIS — Z881 Allergy status to other antibiotic agents status: Secondary | ICD-10-CM | POA: Diagnosis not present

## 2023-03-18 DIAGNOSIS — Z90710 Acquired absence of both cervix and uterus: Secondary | ICD-10-CM | POA: Diagnosis not present

## 2023-03-18 DIAGNOSIS — Z880 Allergy status to penicillin: Secondary | ICD-10-CM | POA: Insufficient documentation

## 2023-03-18 DIAGNOSIS — Z79899 Other long term (current) drug therapy: Secondary | ICD-10-CM | POA: Insufficient documentation

## 2023-03-18 DIAGNOSIS — Z9049 Acquired absence of other specified parts of digestive tract: Secondary | ICD-10-CM | POA: Diagnosis not present

## 2023-03-18 DIAGNOSIS — Z823 Family history of stroke: Secondary | ICD-10-CM | POA: Diagnosis not present

## 2023-03-18 DIAGNOSIS — R0602 Shortness of breath: Secondary | ICD-10-CM | POA: Diagnosis not present

## 2023-03-18 DIAGNOSIS — Z86718 Personal history of other venous thrombosis and embolism: Secondary | ICD-10-CM | POA: Diagnosis present

## 2023-03-18 DIAGNOSIS — Z8379 Family history of other diseases of the digestive system: Secondary | ICD-10-CM | POA: Insufficient documentation

## 2023-03-18 DIAGNOSIS — Z7901 Long term (current) use of anticoagulants: Secondary | ICD-10-CM | POA: Diagnosis not present

## 2023-03-18 DIAGNOSIS — R079 Chest pain, unspecified: Secondary | ICD-10-CM | POA: Insufficient documentation

## 2023-03-18 DIAGNOSIS — Z818 Family history of other mental and behavioral disorders: Secondary | ICD-10-CM | POA: Insufficient documentation

## 2023-03-18 DIAGNOSIS — M79651 Pain in right thigh: Secondary | ICD-10-CM | POA: Diagnosis not present

## 2023-03-18 LAB — CBC WITH DIFFERENTIAL (CANCER CENTER ONLY)
Abs Immature Granulocytes: 0.02 10*3/uL (ref 0.00–0.07)
Basophils Absolute: 0 10*3/uL (ref 0.0–0.1)
Basophils Relative: 0 %
Eosinophils Absolute: 0.1 10*3/uL (ref 0.0–0.5)
Eosinophils Relative: 1 %
HCT: 40.8 % (ref 36.0–46.0)
Hemoglobin: 12.9 g/dL (ref 12.0–15.0)
Immature Granulocytes: 0 %
Lymphocytes Relative: 35 %
Lymphs Abs: 2.3 10*3/uL (ref 0.7–4.0)
MCH: 31 pg (ref 26.0–34.0)
MCHC: 31.6 g/dL (ref 30.0–36.0)
MCV: 98.1 fL (ref 80.0–100.0)
Monocytes Absolute: 0.4 10*3/uL (ref 0.1–1.0)
Monocytes Relative: 6 %
Neutro Abs: 3.8 10*3/uL (ref 1.7–7.7)
Neutrophils Relative %: 58 %
Platelet Count: 288 10*3/uL (ref 150–400)
RBC: 4.16 MIL/uL (ref 3.87–5.11)
RDW: 13.3 % (ref 11.5–15.5)
WBC Count: 6.7 10*3/uL (ref 4.0–10.5)
nRBC: 0 % (ref 0.0–0.2)

## 2023-03-18 LAB — CMP (CANCER CENTER ONLY)
ALT: 21 U/L (ref 0–44)
AST: 16 U/L (ref 15–41)
Albumin: 4 g/dL (ref 3.5–5.0)
Alkaline Phosphatase: 62 U/L (ref 38–126)
Anion gap: 9 (ref 5–15)
BUN: 16 mg/dL (ref 6–20)
CO2: 25 mmol/L (ref 22–32)
Calcium: 8.8 mg/dL — ABNORMAL LOW (ref 8.9–10.3)
Chloride: 106 mmol/L (ref 98–111)
Creatinine: 0.89 mg/dL (ref 0.44–1.00)
GFR, Estimated: >60 mL/min (ref >60)
Glucose, Bld: 96 mg/dL (ref 70–99)
Potassium: 4.4 mmol/L (ref 3.5–5.1)
Sodium: 140 mmol/L (ref 135–145)
Total Bilirubin: 0.4 mg/dL (ref 0.3–1.2)
Total Protein: 6.7 g/dL (ref 6.5–8.1)

## 2023-03-18 MED ORDER — APIXABAN 2.5 MG PO TABS: 2.5000 mg | ORAL_TABLET | ORAL | 0 refills | Status: DC

## 2023-03-18 NOTE — Progress Notes (Signed)
Pt here for follow up. Pt reports that she has been feeling dizzy for the past few days.

## 2023-03-18 NOTE — Assessment & Plan Note (Addendum)
Based on the clinical history, this is likely a provoked DVT due to immobilization from her long road trip-per patient, she was on aspirin 81 mg at that time. Patient has finished 3 months of anticoagulation with Eliquis 5 mg twice daily.  Repeat right lower extremity showed no DVT previous hypercoagulable workup showed Antithrombin activity 148 slightly elevated, Protein C 143 protein S 83 wnl. -Obtained by PCP, results were scanned into EMR. Negative factor V leiden mutation, negative prothrombin mutation, negative antiphospholipid syndrome antibodies.  Negative beta-2 glycoprotein antibodies.  Recommend patient to stop Eliquis 5mg  twice daily today.  She may resume aspirin 81 mg daily Recommend stop aspirin and take Eliquis 2.5 mg twice daily if she expects prolonged immobilization, i.e. flights, long distance road trip, etc.

## 2023-03-18 NOTE — Progress Notes (Signed)
Hematology/Oncology Consult note [phone] [phone]        REFERRING PROVIDER: Cleatis Polka., MD   CHIEF COMPLAINTS/REASON FOR VISIT:  Evaluation of acute DVT   ASSESSMENT & PLAN:   Acute deep vein thrombosis (DVT) of proximal vein of right lower extremity (HCC) Based on the clinical history, this is likely a provoked DVT due to immobilization from her long road trip-per patient, she was on aspirin 81 mg at that time. Patient has finished 3 months of anticoagulation with Eliquis 5 mg twice daily.  Repeat right lower extremity showed no DVT previous hypercoagulable workup showed Antithrombin activity 148 slightly elevated, Protein C 143 protein S 83 wnl. -Obtained by PCP, results were scanned into EMR. Negative factor V leiden mutation, negative prothrombin mutation, negative antiphospholipid syndrome antibodies.  Negative beta-2 glycoprotein antibodies.  Recommend patient to stop Eliquis 5mg  twice daily today.  She may resume aspirin 81 mg daily Recommend stop aspirin and take Eliquis 2.5 mg twice daily if she expects prolonged immobilization, i.e. flights, long distance road trip, etc.   Orders Placed This Encounter  Procedures   CBC with Differential (Cancer Center Only)    Standing Status:   Future    Standing Expiration Date:   03/17/2024  We spent sufficient time to discuss many aspect of care, questions were answered to patient's satisfaction. A total of 25 minutes was spent on this visit.  With 5 minutes spent reviewing image findings, 15 minutes counseling the patient on the diagnosis and prophylactic anticoagulation plan.  Additional 5 minutes was spent on answering patient's questions.   Follow up in 6 months.  All questions were answered. The patient knows to call the clinic with any problems, questions or concerns.  Rickard Patience, MD, PhD [facility] Hematology Oncology 03/18/2023   HISTORY OF PRESENTING ILLNESS:   [name] [name] is a  57 y.o.  female with PMH listed below was seen in consultation at the request of  Cleatis Polka., MD  for evaluation of recent history of right lower extremity DVT.  - DVT developed in April 2024 after a long road trip (8-9 hours of driving per day over 3 days, with some breaks) - Had been experiencing pain on the side and behind the right knee for 3 weeks prior to the road trip, thought to be due to lipomas in that area, Pain was severe, felt like being hit with a crowbar, and radiated to the calf and thigh, - Leg became warm and swollen in the affected area -11/24/2022, patient presented emergency room and 1. Nonocclusive DVT is observed in the right superficial femoral vein midportion. There is also calf vein DVT in the anterior tibial and posterior tibial veins.  Patient was started on Eliquis starting packet with 10 mg twice daily for 1 week followed by 5 mg twice daily. Patient was referred to establish care with hematology for further evaluation. Since the start of Eliquis, patient reports slight improvement of right lower extremity swelling and the pain. She has felt some shortness of breath and chest pain.  She feels that this could be secondary to the lipomas in the chest area. 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 She expresses frustration about lack of medical expertise and support for  managing this rare condition  She is concerned about the possibility of lipomas entering the bloodstream based on her reading    INTERVAL HISTORY [name] [name] is a 57 y.o. female who has above history reviewed by me today presents for follow up visit for right lower extremity DVT.   Since her last visit, she  presented to emergency room complaining for shortness of breath. 12/13/2022 CT angiogram PE protocol was obtained and was negative for pulmonary embolism. She has contrast allergy 5/16 - 12/17/2022, admitted due to acute gastroenteritis 12/18/2022, ER visit due to concern of allergic reaction.  She self administered his EpiPen at home prior to EMS transport.  Symptoms improved after she arrived to the ER.  She was treated with a course of prednisone orally.  03/07/2023 right lower extremity ultrasound showed no right lower extremity DVT  Patient takes Eliquis 5 mg twice daily.  She tolerates well.  No bleeding events.  Today she reports feeling well.  No chest pain or shortness of breath.  MEDICAL HISTORY:  Past Medical History:  Diagnosis Date   Anemia    many years ago   Anxiety    situational   Arthritis    "hands" (07/12/2012)   Asthma    seasonal    Chest pain    Chest pain at rest, on going 07/12/2012   Dercum's disease    DVT (deep venous thrombosis) (HCC)    Eczema    Family history of early CAD 07/12/2012   Fibromyalgia    GERD (gastroesophageal reflux disease)    Headache(784.0)    "often; not daily" (07/12/2012)   Hypertension    not on medications   Kidney stones    Migraine    Pneumonia    as a child   PONV (postoperative nausea and vomiting)    she states she gets very sick    SURGICAL HISTORY: Past Surgical History:  Procedure Laterality Date   breast lift     CARDIAC CATHETERIZATION  07/11/2012   CHOLECYSTECTOMY N/A 09/18/2015   Procedure: LAPAROSCOPIC CHOLECYSTECTOMY;  Surgeon: Abigail Miyamoto, MD;  Location: [facility] OR;  Service: General;  Laterality: N/A;   FOOT SURGERY  09/2019   toe surgery   LEFT HEART CATHETERIZATION WITH CORONARY ANGIOGRAM N/A 07/11/2012   Procedure: LEFT HEART CATHETERIZATION WITH CORONARY ANGIOGRAM;  Surgeon: Runell Gess, MD;  Location: [facility] CATH LAB;  Service: Cardiovascular;  Laterality: N/A;   REDUCTION MAMMAPLASTY Bilateral  10+ years ago   TONSILLECTOMY  ~ 1976   tubes and ovaries removed  2015   VAGINAL HYSTERECTOMY  ~ 2009    SOCIAL HISTORY: Social History   Socioeconomic History   Marital status: Married    Spouse name: Not on file   Number of children: Not on file   Years of education: Not on file   Highest education level: Not on file  Occupational History   Not on file  Tobacco Use   Smoking status: Never   Smokeless tobacco: Never  Vaping Use   Vaping status: Never Used  Substance and Sexual Activity   Alcohol use: No   Drug use: No   Sexual activity: Yes  Other Topics Concern   Not on file  Social History Narrative   Not on file   Social Determinants of Health   Financial Resource Strain: Low Risk  (12/13/2022)   Overall Financial Resource Strain (CARDIA)    Difficulty of Paying Living Expenses: Not very hard  Food Insecurity: No Food Insecurity (  12/16/2022)   Hunger Vital Sign    Worried About Running Out of Food in the Last Year: Never true    Ran Out of Food in the Last Year: Never true  Transportation Needs: No Transportation Needs (12/16/2022)   PRAPARE - Administrator, Civil Service (Medical): No    Lack of Transportation (Non-Medical): No  Physical Activity: Not on file  Stress: No Stress Concern Present (12/13/2022)   [employer] of Occupational Health - Occupational Stress Questionnaire    Feeling of Stress : Not at all  Social Connections: Unknown (12/14/2022)   Received from [facility], [facility] Short Social Needs Screening - Social Connection    Would you like help with any of the following needs: food, medicine/medical supplies, transportation, loneliness, housing or utilities?: Not on file  Intimate Partner Violence: Not At Risk (12/16/2022)   Humiliation, Afraid, Rape, and Kick questionnaire    Fear of Current or Ex-Partner: No    Emotionally Abused: No    Physically Abused: No    Sexually Abused: No    FAMILY HISTORY: Family  History  Problem Relation Age of Onset   Hiatal hernia Mother    Hypertension Mother    Depression Mother    Allergic rhinitis Mother    Heart disease Father    Stroke Maternal Grandfather    Chronic Renal Failure Paternal Grandmother    Drug abuse Son    Allergic rhinitis Brother    Allergic rhinitis Maternal Aunt     ALLERGIES:  is allergic to augmentin [amoxicillin-pot clavulanate], betadine [povidone iodine], contrast media [iodinated contrast media], morphine and codeine, prednisone, yellow jacket venom [bee venom], azithromycin, erythromycin, iohexol, oxycontin [oxycodone], avelox [moxifloxacin hcl in nacl], codeine, gadolinium derivatives, iodine, lamisil [terbinafine], levaquin [levofloxacin in d5w], oxycodone-acetaminophen, pantoprazole, praluent [alirocumab], pravastatin, rosuvastatin, shellfish allergy, dilaudid [hydromorphone hcl], oxycodone hcl, and tramadol.  MEDICATIONS:  Current Outpatient Medications  Medication Sig Dispense Refill   acetaminophen (TYLENOL) 500 MG tablet Take 500 mg by mouth every 6 (six) hours as needed for mild pain.     apixaban (ELIQUIS) 2.5 MG TABS tablet Take 1 tablet (2.5 mg total) by mouth See admin instructions. Take Eliquis 2.5mg  BID for prophylaxis if you anticipate immobilization 60 tablet 0   Ciclopirox 1 % shampoo USE AS DIRECTED ON SCALP.     clobetasol (TEMOVATE) 0.05 % external solution Apply topically.     diclofenac Sodium (VOLTAREN) 1 % GEL APPLY 2 GRAMS TO THE AFFECTED AREA(S) BY TOPICAL ROUTE 4 TIMES PER DAY     ezetimibe (ZETIA) 10 MG tablet Take 1 tablet (10 mg total) by mouth daily. 90 tablet 3   icosapent Ethyl (VASCEPA) 1 g capsule Take 2 capsules (2 g total) by mouth 2 (two) times daily. 360 capsule 3   levocetirizine (XYZAL) 5 MG tablet Take 1 tablet (5 mg total) by mouth every evening. 30 tablet 5   metroNIDAZOLE (METROGEL) 0.75 % gel metronidazole 0.75 % topical gel  APPLY ON THE SKIN DAILY FOR ROSACEA     olmesartan  (BENICAR) 20 MG tablet Take 20 mg by mouth daily.     RABEprazole (ACIPHEX) 20 MG tablet Take 20 mg by mouth 2 (two) times daily.     rizatriptan (MAXALT-MLT) 10 MG disintegrating tablet Take 10 mg by mouth every 2 (two) hours as needed for migraine.   11   rosuvastatin (CRESTOR) 5 MG tablet Take 1 tablet (5 mg total) by mouth daily. 90 tablet 3  Vitamin D, Ergocalciferol, (DRISDOL) 50000 units CAPS capsule Take 50,000 Units by mouth every Wednesday.      EPINEPHrine (EPIPEN 2-PAK) 0.3 mg/0.3 mL IJ SOAJ injection Inject 0.3 mg into the muscle as needed. (Patient not taking: Reported on 03/18/2023) 1 each 1   ipratropium (ATROVENT) 0.03 % nasal spray Place 1-2 sprays into both nostrils 2 (two) times daily as needed (nasal drainage). (Patient not taking: Reported on 03/18/2023) 30 mL 5   ketoconazole (NIZORAL) 2 % cream Apply 1 Application topically daily. (Patient not taking: Reported on 03/18/2023) 60 g 2   ketoconazole (NIZORAL) 2 % cream Apply 1 Application topically daily. (Patient not taking: Reported on 03/18/2023) 60 g 0   Olopatadine-Mometasone (RYALTRIS) 665-25 MCG/ACT SUSP Place 1-2 sprays into the nose in the morning and at bedtime. (Patient not taking: Reported on 03/18/2023) 29 g 5   ondansetron (ZOFRAN-ODT) 4 MG disintegrating tablet Take by mouth. (Patient not taking: Reported on 03/18/2023)     scopolamine (TRANSDERM-SCOP) 1 MG/3DAYS Place 1 patch (1.5 mg total) onto the skin every 3 (three) days. (Patient not taking: Reported on 03/18/2023) 10 patch 1   No current facility-administered medications for this visit.    Review of Systems  Constitutional:  Negative for appetite change, chills, fatigue and fever.  HENT:   Negative for hearing loss and voice change.   Eyes:  Negative for eye problems.  Respiratory:  Negative for chest tightness, cough and shortness of breath.   Cardiovascular:  Negative for chest pain and leg swelling.  Gastrointestinal:  Negative for abdominal distention,  abdominal pain and blood in stool.  Endocrine: Negative for hot flashes.  Genitourinary:  Negative for difficulty urinating and frequency.   Musculoskeletal:  Negative for arthralgias.  Skin:  Negative for itching and rash.  Neurological:  Negative for extremity weakness.  Hematological:  Negative for adenopathy.  Psychiatric/Behavioral:  Negative for confusion.    PHYSICAL EXAMINATION: ECOG PERFORMANCE STATUS: 1 - Symptomatic but completely ambulatory Vitals:   03/18/23 1018  BP: 119/85  Pulse: 71  Resp: 18  Temp: 98.1 F (36.7 C)   Filed Weights   03/18/23 1018  Weight: 241 lb 1.6 oz (109.4 kg)    Physical Exam Constitutional:      General: She is not in acute distress. HENT:     Head: Normocephalic and atraumatic.  Eyes:     General: No scleral icterus. Cardiovascular:     Rate and Rhythm: Normal rate.  Pulmonary:     Effort: Pulmonary effort is normal. No respiratory distress.  Abdominal:     General: There is no distension.  Musculoskeletal:        General: Normal range of motion.     Cervical back: Normal range of motion.     Right lower leg: No edema.     Left lower leg: No edema.  Skin:    Findings: No erythema.  Neurological:     Mental Status: She is alert and oriented to person, place, and time. Mental status is at baseline.     LABORATORY DATA:  I have reviewed the data as listed    Latest Ref Rng & Units 03/18/2023   10:01 AM 12/18/2022    5:12 PM 12/17/2022    4:44 AM  CBC  WBC 4.0 - 10.5 K/uL 6.7  8.6  5.4   Hemoglobin 12.0 - 15.0 g/dL 13.0  86.5  78.4   Hematocrit 36.0 - 46.0 % 40.8  42.1  36.9   Platelets 150 -  400 K/uL 288  279  188       Latest Ref Rng & Units 03/18/2023   10:01 AM 12/18/2022    5:12 PM 12/17/2022    4:44 AM  CMP  Glucose 70 - 99 mg/dL 96  161  93   BUN 6 - 20 mg/dL 16  8  9    Creatinine 0.44 - 1.00 mg/dL 0.96  0.45  4.09   Sodium 135 - 145 mmol/L 140  140  140   Potassium 3.5 - 5.1 mmol/L 4.4  3.3  3.7   Chloride  98 - 111 mmol/L 106  102  107   CO2 22 - 32 mmol/L 25  27  25    Calcium 8.9 - 10.3 mg/dL 8.8  8.8  7.9   Total Protein 6.5 - 8.1 g/dL 6.7  6.8  5.6   Total Bilirubin 0.3 - 1.2 mg/dL 0.4  0.4  0.7   Alkaline Phos 38 - 126 U/L 62  61  55   AST 15 - 41 U/L 16  28  23    ALT 0 - 44 U/L 21  37  26       RADIOGRAPHIC STUDIES: I have personally reviewed the radiological images as listed and agreed with the findings in the report. US Venous Img Lower Unilateral Right  Result Date: 03/07/2023 CLINICAL DATA:  Follow-up right lower extremity DVT Right thigh pain EXAM: RIGHT LOWER EXTREMITY VENOUS DOPPLER ULTRASOUND TECHNIQUE: Gray-scale sonography with compression, as well as color and duplex ultrasound, were performed to evaluate the deep venous system(s) from the level of the common femoral vein through the popliteal and proximal calf veins. COMPARISON:  11/24/2022 FINDINGS: VENOUS Normal compressibility of the common femoral, superficial femoral, and popliteal veins, as well as the visualized calf veins. Visualized portions of profunda femoral vein and great saphenous vein unremarkable. No filling defects to suggest DVT on grayscale or color Doppler imaging. Doppler waveforms show normal direction of venous flow, normal respiratory plasticity and response to augmentation. Limited views of the contralateral common femoral vein are unremarkable. OTHER None. Limitations: none IMPRESSION: No right lower extremity VT. Electronically Signed   By: Acquanetta Belling M.D.   On: 03/07/2023 13:36   DG Chest 1 View  Result Date: 12/18/2022 CLINICAL DATA:  Shortness of breath. EXAM: CHEST  1 VIEW COMPARISON:  04/14/2021 FINDINGS: The heart size and mediastinal contours are within normal limits. Mild linear opacity is seen in the lateral right lung base which is new, and may be due to atelectasis or scarring. No evidence of pulmonary consolidation or edema. No evidence of pleural effusion. IMPRESSION: Mild right basilar  atelectasis versus scarring. Electronically Signed   By: Danae Orleans M.D.   On: 12/18/2022 18:19

## 2023-03-21 ENCOUNTER — Other Ambulatory Visit (HOSPITAL_BASED_OUTPATIENT_CLINIC_OR_DEPARTMENT_OTHER): Payer: PRIVATE HEALTH INSURANCE

## 2023-03-21 ENCOUNTER — Other Ambulatory Visit: Payer: PRIVATE HEALTH INSURANCE

## 2023-03-21 ENCOUNTER — Telehealth: Payer: Self-pay

## 2023-03-21 NOTE — Telephone Encounter (Signed)
Left a message for patient to call the office. We will need to get patient schedule to restart venom. We will also need to let her know this will be her last restart.

## 2023-03-21 NOTE — Telephone Encounter (Signed)
-----   Message from Ellamae Sia sent at 03/17/2023 12:54 PM EDT ----- Morrie Sheldon - this is the last time we will restart VIT for her. I changed the Rx. Mixed vespid + wasp. Start wasp at 0.001 and mixed vespid at 0.003 as per protocol. Thank you.

## 2023-03-22 NOTE — Telephone Encounter (Signed)
Called and left a voicemail asking for patient to return call to get her scheduled.

## 2023-03-24 ENCOUNTER — Other Ambulatory Visit: Payer: PRIVATE HEALTH INSURANCE

## 2023-03-24 ENCOUNTER — Ambulatory Visit: Payer: PRIVATE HEALTH INSURANCE | Admitting: Oncology

## 2023-03-28 ENCOUNTER — Ambulatory Visit: Payer: PRIVATE HEALTH INSURANCE | Admitting: Podiatry

## 2023-03-30 NOTE — Telephone Encounter (Signed)
Patient called back and has been scheduled to start Venom Injections in [facility] due to availability. She has been scheduled out weekly to help work around when she will be out of town.

## 2023-03-31 ENCOUNTER — Ambulatory Visit (INDEPENDENT_AMBULATORY_CARE_PROVIDER_SITE_OTHER): Payer: PRIVATE HEALTH INSURANCE

## 2023-03-31 DIAGNOSIS — Z91038 Other insect allergy status: Secondary | ICD-10-CM | POA: Diagnosis not present

## 2023-04-07 ENCOUNTER — Ambulatory Visit: Payer: PRIVATE HEALTH INSURANCE

## 2023-04-07 DIAGNOSIS — Z91038 Other insect allergy status: Secondary | ICD-10-CM | POA: Diagnosis not present

## 2023-04-12 ENCOUNTER — Ambulatory Visit: Payer: PRIVATE HEALTH INSURANCE | Admitting: Podiatry

## 2023-04-14 ENCOUNTER — Ambulatory Visit: Payer: PRIVATE HEALTH INSURANCE

## 2023-04-14 DIAGNOSIS — Z91038 Other insect allergy status: Secondary | ICD-10-CM | POA: Diagnosis not present

## 2023-04-19 ENCOUNTER — Ambulatory Visit: Payer: PRIVATE HEALTH INSURANCE | Admitting: Podiatry

## 2023-04-19 ENCOUNTER — Ambulatory Visit (INDEPENDENT_AMBULATORY_CARE_PROVIDER_SITE_OTHER): Payer: PRIVATE HEALTH INSURANCE

## 2023-04-19 DIAGNOSIS — M7752 Other enthesopathy of left foot: Secondary | ICD-10-CM

## 2023-04-19 DIAGNOSIS — Z7901 Long term (current) use of anticoagulants: Secondary | ICD-10-CM

## 2023-04-19 DIAGNOSIS — B351 Tinea unguium: Secondary | ICD-10-CM

## 2023-04-21 ENCOUNTER — Ambulatory Visit: Payer: PRIVATE HEALTH INSURANCE

## 2023-04-21 DIAGNOSIS — Z91038 Other insect allergy status: Secondary | ICD-10-CM

## 2023-04-28 ENCOUNTER — Ambulatory Visit: Payer: PRIVATE HEALTH INSURANCE

## 2023-04-30 NOTE — Progress Notes (Signed)
Subjective: No chief complaint on file.    57 year old female presents the office today for concerns of her nail started to get ingrown, dystrophy of the toenail.  Has been getting pain to the left big toenail again when she puts pressure on the central portion of the nail.  She does not report any drainage or pus or any swelling or redness.  She has no new concerns otherwise.  No injuries.   She is on Eliquis for recent blood clot  Objective: AAO x3, NAD DP/PT pulses palpable bilaterally, CRT less than 3 seconds Nails are mildly incurvated along the nail borders of the right hallux the most noticeable.  There is some persistent discoloration of the distal portion of the nail more yellow, brown discoloration.  No hyperpigmentation.  No edema, erythema or signs of infection.  On the left hallux toenail she does get tenderness along palpation of the central portion of the toenail. No pain with calf compression, swelling, warmth, erythema  Assessment: 57 year old female with ingrown toenails, without signs of infection; chronic anticoagulation; bone spur  Plan: -All treatment options discussed with the patient including all alternatives, risks, complications.  -X-rays were obtained and reviewed of the foot.  3 views of the foot were obtained.  Lateral view there is dorsal prominence noted at the distal phalanx laterally which can be contribute to her symptoms given that is worse localized.  There is no evidence of acute fracture. -We discussed the conservative and surgical options given the bone spur.  Continue offloading offloading pads dispensed.  I sharply debrided the nails x 10 and any complications or bleeding.  Return in about 6 weeks (around 05/31/2023).  Vivi Barrack DPM

## 2023-05-02 ENCOUNTER — Ambulatory Visit (INDEPENDENT_AMBULATORY_CARE_PROVIDER_SITE_OTHER): Payer: PRIVATE HEALTH INSURANCE

## 2023-05-02 DIAGNOSIS — Z91038 Other insect allergy status: Secondary | ICD-10-CM

## 2023-05-19 ENCOUNTER — Ambulatory Visit (INDEPENDENT_AMBULATORY_CARE_PROVIDER_SITE_OTHER): Payer: PRIVATE HEALTH INSURANCE | Admitting: *Deleted

## 2023-05-19 DIAGNOSIS — Z91038 Other insect allergy status: Secondary | ICD-10-CM

## 2023-05-24 ENCOUNTER — Ambulatory Visit: Payer: PRIVATE HEALTH INSURANCE | Admitting: Podiatry

## 2023-05-24 DIAGNOSIS — Z7901 Long term (current) use of anticoagulants: Secondary | ICD-10-CM

## 2023-05-24 DIAGNOSIS — L600 Ingrowing nail: Secondary | ICD-10-CM | POA: Diagnosis not present

## 2023-05-24 DIAGNOSIS — B351 Tinea unguium: Secondary | ICD-10-CM | POA: Diagnosis not present

## 2023-05-24 NOTE — Progress Notes (Signed)
Subjective: Chief Complaint  Patient presents with   nail trim    Here for routine nail trimming.     57 year old female presents the office today for concerns of her nail started to get ingrown, dystrophy of the toenail.  She recently went to [place] and did a lot of walking.  She has been having some pain to the right big toe nail and she had some swelling no drainage or pus.  She has cramping to her toes as well in her feet.  This has been ongoing issue.  She is on Eliquis   Objective: AAO x3, NAD DP/PT pulses palpable bilaterally, CRT less than 3 seconds Nails are mildly incurvated along the nail borders of the right hallux the most noticeable.  Nails are dystrophic.  On the right hallux on the send mild edema and erythema on the order is more from inflammation as opposed to infection.  No drainage or pus.  No ascending cellulitis. No pain with calf compression, swelling, warmth, erythema  Assessment: 57 year old female with ingrown toenails, without signs of infection; chronic anticoagulation  Plan: -All treatment options discussed with the patient including all alternatives, risks, complications.  -Prescription given the nails x 10 with any complications or bleeding.  Particular the ingrown toenail was able to be deformed with any complications or bleeding.  Discussed Epsom salt soaks.  Monitoring signs or symptoms of infection. -Discussed various exercises to help with the cramping.  Discussed hydration.  Return in about 6 weeks (around 07/05/2023) for ingrown toenail.  Vivi Barrack DPM

## 2023-05-24 NOTE — Patient Instructions (Signed)

## 2023-05-25 ENCOUNTER — Ambulatory Visit (INDEPENDENT_AMBULATORY_CARE_PROVIDER_SITE_OTHER): Payer: PRIVATE HEALTH INSURANCE | Admitting: *Deleted

## 2023-05-25 DIAGNOSIS — Z91038 Other insect allergy status: Secondary | ICD-10-CM | POA: Diagnosis not present

## 2023-06-02 ENCOUNTER — Ambulatory Visit (INDEPENDENT_AMBULATORY_CARE_PROVIDER_SITE_OTHER): Payer: PRIVATE HEALTH INSURANCE

## 2023-06-02 DIAGNOSIS — Z91038 Other insect allergy status: Secondary | ICD-10-CM | POA: Diagnosis not present

## 2023-06-08 ENCOUNTER — Ambulatory Visit (INDEPENDENT_AMBULATORY_CARE_PROVIDER_SITE_OTHER): Payer: PRIVATE HEALTH INSURANCE | Admitting: *Deleted

## 2023-06-08 DIAGNOSIS — Z91038 Other insect allergy status: Secondary | ICD-10-CM

## 2023-06-14 ENCOUNTER — Ambulatory Visit (INDEPENDENT_AMBULATORY_CARE_PROVIDER_SITE_OTHER): Payer: PRIVATE HEALTH INSURANCE | Admitting: *Deleted

## 2023-06-14 DIAGNOSIS — Z91038 Other insect allergy status: Secondary | ICD-10-CM

## 2023-06-14 DIAGNOSIS — J309 Allergic rhinitis, unspecified: Secondary | ICD-10-CM

## 2023-06-21 ENCOUNTER — Ambulatory Visit (INDEPENDENT_AMBULATORY_CARE_PROVIDER_SITE_OTHER): Payer: PRIVATE HEALTH INSURANCE | Admitting: *Deleted

## 2023-06-21 DIAGNOSIS — Z91038 Other insect allergy status: Secondary | ICD-10-CM

## 2023-06-27 ENCOUNTER — Ambulatory Visit (INDEPENDENT_AMBULATORY_CARE_PROVIDER_SITE_OTHER): Payer: PRIVATE HEALTH INSURANCE | Admitting: *Deleted

## 2023-06-27 DIAGNOSIS — Z91038 Other insect allergy status: Secondary | ICD-10-CM

## 2023-07-04 ENCOUNTER — Ambulatory Visit (INDEPENDENT_AMBULATORY_CARE_PROVIDER_SITE_OTHER): Payer: PRIVATE HEALTH INSURANCE | Admitting: *Deleted

## 2023-07-04 DIAGNOSIS — Z91038 Other insect allergy status: Secondary | ICD-10-CM

## 2023-07-12 ENCOUNTER — Telehealth: Payer: Self-pay | Admitting: *Deleted

## 2023-07-12 ENCOUNTER — Ambulatory Visit (INDEPENDENT_AMBULATORY_CARE_PROVIDER_SITE_OTHER): Payer: PRIVATE HEALTH INSURANCE | Admitting: Podiatry

## 2023-07-12 ENCOUNTER — Ambulatory Visit (INDEPENDENT_AMBULATORY_CARE_PROVIDER_SITE_OTHER): Payer: PRIVATE HEALTH INSURANCE | Admitting: *Deleted

## 2023-07-12 ENCOUNTER — Encounter: Payer: Self-pay | Admitting: Podiatry

## 2023-07-12 DIAGNOSIS — B351 Tinea unguium: Secondary | ICD-10-CM

## 2023-07-12 DIAGNOSIS — L600 Ingrowing nail: Secondary | ICD-10-CM

## 2023-07-12 DIAGNOSIS — Z91038 Other insect allergy status: Secondary | ICD-10-CM | POA: Diagnosis not present

## 2023-07-12 DIAGNOSIS — Z7901 Long term (current) use of anticoagulants: Secondary | ICD-10-CM | POA: Diagnosis not present

## 2023-07-12 NOTE — Telephone Encounter (Signed)
Please call patient back.  It's too late to get that tryptase level drawn now.  It must be drawn within 1-3 hours of an "allergic" reaction. This reaction can be triggered by anything - foods, stinging insects.

## 2023-07-12 NOTE — Telephone Encounter (Signed)
Patient came in to get her venom injection and informed me that this past Saturday she was visiting a friend in [place] and was eating a fair amount of pizza that had gluten in the crust. She stated that she ate some pistachio pizza and a pepperoni pizza with spicy honey. She stated that afterwards she experienced a lot of bloating, stomach upset, sneezing, and runny nose. She didn't think that she was having anaphylaxis so she took 2 Benadryl and her symptoms subsided. She stated that she spoke with you in the past and whenever she had some type of reaction she should go and get her Tryptase drawn. She didn't have the requisition with her and she was in [place] so she wasn't sure what to do. She wasn't sure if she should get the lab drawn when she has a random reaction or if it was a reaction to a stinging insect. Please advise, thank you so much.

## 2023-07-12 NOTE — Telephone Encounter (Signed)
Called patient - DOB/NEED DPR - LMOVM to contact office regarding below provider notation or check her myChart for updated message from provider.  If patient call back please advise of above notation.

## 2023-07-17 NOTE — Progress Notes (Signed)
Subjective: Chief Complaint  Patient presents with   Nail Problem    RM#11 Nail trimming     57 year old female presents the office today for concerns of her nail started to get ingrown, dystrophy of the toenail.  States that the right big toenail still gets uncomfortable.  No drainage or pus.  She is on Eliquis   Objective: AAO x3, NAD DP/PT pulses palpable bilaterally, CRT less than 3 seconds Nails are mildly incurvated along the nail borders of the right hallux the most noticeable.  Grossly unchanged there is no drainage or pus noted today.  Nails are dystrophic.  On the right hallux on the send mild edema and erythema on the order is more from inflammation as opposed to infection.  No drainage or pus.  No ascending cellulitis. No pain with calf compression, swelling, warmth, erythema  Assessment: 57 year old female with ingrown toenails, without signs of infection; chronic anticoagulation  Plan: -All treatment options discussed with the patient including all alternatives, risks, complications.  -Prescription given the nails x 10 with any complications or bleeding.  In particular sharply debrided the ingrown toenail was able to be deformed with any complications or bleeding.  Continue Epsom salts soaks.  Discussed partial nail avulsion.  Vivi Barrack DPM

## 2023-07-19 ENCOUNTER — Ambulatory Visit: Payer: PRIVATE HEALTH INSURANCE

## 2023-07-25 ENCOUNTER — Ambulatory Visit (INDEPENDENT_AMBULATORY_CARE_PROVIDER_SITE_OTHER): Payer: PRIVATE HEALTH INSURANCE

## 2023-07-25 DIAGNOSIS — Z91038 Other insect allergy status: Secondary | ICD-10-CM

## 2023-08-01 ENCOUNTER — Ambulatory Visit (INDEPENDENT_AMBULATORY_CARE_PROVIDER_SITE_OTHER): Payer: PRIVATE HEALTH INSURANCE | Admitting: *Deleted

## 2023-08-01 DIAGNOSIS — Z91038 Other insect allergy status: Secondary | ICD-10-CM | POA: Diagnosis not present

## 2023-08-10 ENCOUNTER — Ambulatory Visit: Payer: PRIVATE HEALTH INSURANCE | Admitting: *Deleted

## 2023-08-10 ENCOUNTER — Ambulatory Visit: Payer: PRIVATE HEALTH INSURANCE

## 2023-08-10 DIAGNOSIS — Z91038 Other insect allergy status: Secondary | ICD-10-CM

## 2023-08-11 ENCOUNTER — Ambulatory Visit: Payer: PRIVATE HEALTH INSURANCE

## 2023-08-16 ENCOUNTER — Ambulatory Visit: Payer: PRIVATE HEALTH INSURANCE

## 2023-08-23 ENCOUNTER — Telehealth: Payer: Self-pay | Admitting: Allergy & Immunology

## 2023-08-23 ENCOUNTER — Ambulatory Visit (INDEPENDENT_AMBULATORY_CARE_PROVIDER_SITE_OTHER): Payer: PRIVATE HEALTH INSURANCE | Admitting: *Deleted

## 2023-08-23 DIAGNOSIS — Z91038 Other insect allergy status: Secondary | ICD-10-CM | POA: Diagnosis not present

## 2023-08-23 NOTE — Telephone Encounter (Signed)
Left a message for patient to contact the office to follow up after her reaction this morning and to inform her of Dr. Elmyra Ricks recommendation.

## 2023-08-23 NOTE — Telephone Encounter (Signed)
Patient's allergy flow sheet has been updated to reflect these changes.

## 2023-08-23 NOTE — Telephone Encounter (Signed)
Please hold mixed vespid injection to the prior dose where she had no issues (strength 3, 0.4cc) repeat x 2 doses and then increase again to next dose.   If she continues to have issues I may have to freeze her at strength 3, 0.4cc.

## 2023-08-23 NOTE — Telephone Encounter (Signed)
I was asked to evaluate her following itching and tingling around her lips. Evidently this happens every time that she reaches this point in her venom shots. She has had this 3 times or so since she started with venom immunotherapy.   Vitals were normal. We gave her Allegra while in the office. I will defer to Dr. Selena Batten for future management.   Malachi Bonds, MD [facility]

## 2023-08-23 NOTE — Telephone Encounter (Signed)
Called and left a voicemail asking for patient to return call to see how she is doing and to update her on her venom immunotherapy per Dr. Selena Batten.

## 2023-08-24 NOTE — Telephone Encounter (Signed)
I called and spoke with the patient and she stated that she is doing fine and she is not having any more issues after she left the office yesterday. She states that she is currently taking 2 antihistamines the day of her injection, she is going to start taking them the day before and day after too to see if that helps.

## 2023-08-24 NOTE — Telephone Encounter (Signed)
Noted, thank you

## 2023-08-29 ENCOUNTER — Encounter (HOSPITAL_BASED_OUTPATIENT_CLINIC_OR_DEPARTMENT_OTHER): Payer: Self-pay | Admitting: Emergency Medicine

## 2023-08-29 ENCOUNTER — Other Ambulatory Visit: Payer: Self-pay

## 2023-08-29 ENCOUNTER — Emergency Department (HOSPITAL_BASED_OUTPATIENT_CLINIC_OR_DEPARTMENT_OTHER)
Admission: EM | Admit: 2023-08-29 | Discharge: 2023-08-29 | Discharge status: Against Medical Advice | Payer: PRIVATE HEALTH INSURANCE | Attending: Emergency Medicine | Admitting: Emergency Medicine

## 2023-08-29 DIAGNOSIS — T7840XA Allergy, unspecified, initial encounter: Secondary | ICD-10-CM | POA: Insufficient documentation

## 2023-08-29 DIAGNOSIS — R0602 Shortness of breath: Secondary | ICD-10-CM | POA: Insufficient documentation

## 2023-08-29 DIAGNOSIS — Z5321 Procedure and treatment not carried out due to patient leaving prior to being seen by health care provider: Secondary | ICD-10-CM | POA: Diagnosis not present

## 2023-08-29 NOTE — ED Triage Notes (Signed)
C/o SHOB, itching around eyes, and tingling around mouth/lips. No obvioius swelling noted. 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 Recently have had cold-like symptoms. Unsure what caused reaction this time. Did not use epipen at home.

## 2023-08-30 ENCOUNTER — Ambulatory Visit: Payer: PRIVATE HEALTH INSURANCE

## 2023-09-05 ENCOUNTER — Ambulatory Visit: Payer: PRIVATE HEALTH INSURANCE | Admitting: Podiatry

## 2023-09-05 ENCOUNTER — Encounter: Payer: Self-pay | Admitting: Podiatry

## 2023-09-05 ENCOUNTER — Ambulatory Visit (INDEPENDENT_AMBULATORY_CARE_PROVIDER_SITE_OTHER): Payer: PRIVATE HEALTH INSURANCE | Admitting: *Deleted

## 2023-09-05 DIAGNOSIS — Z91038 Other insect allergy status: Secondary | ICD-10-CM | POA: Diagnosis not present

## 2023-09-05 DIAGNOSIS — B351 Tinea unguium: Secondary | ICD-10-CM | POA: Diagnosis not present

## 2023-09-05 DIAGNOSIS — L600 Ingrowing nail: Secondary | ICD-10-CM

## 2023-09-05 DIAGNOSIS — Z7901 Long term (current) use of anticoagulants: Secondary | ICD-10-CM

## 2023-09-05 DIAGNOSIS — L603 Nail dystrophy: Secondary | ICD-10-CM

## 2023-09-05 NOTE — Progress Notes (Signed)
Subjective: Chief Complaint  Patient presents with   RFC    RM#RFC     58 year old female presents the office today for concerns of her nail started to get ingrown, dystrophy of the toenail.  She feels that the nails have improved some as they are growing out. No drainage or pus noted.  No new concerns.  She is on Eliquis   Objective: AAO x3, NAD DP/PT pulses palpable bilaterally, CRT less than 3 seconds Bilateral hallux nails are dystrophic and mildly ingrown but there is no edema, erythema or signs of infection.  The nails due to.  Growing out incomplete more clear in color.  No pain with calf compression, swelling, warmth, erythema  Assessment: 58 year old female with ingrown toenails, without signs of infection; chronic anticoagulation  Plan: -All treatment options discussed with the patient including all alternatives, risks, complications.  -There is a debrided the and the toenails x 2 any complications or bleeding.  The ingrown superiorly doing much better and is no signs of infection.  Recommend routine debridement.  Topical medication for nail fungus all the culture previously been negative we will see if any topical anti-fungal would be helpful.   Return in about 9 weeks (around 11/07/2023).  Vivi Barrack DPM

## 2023-09-06 ENCOUNTER — Ambulatory Visit: Payer: PRIVATE HEALTH INSURANCE

## 2023-09-06 ENCOUNTER — Telehealth: Payer: Self-pay | Admitting: *Deleted

## 2023-09-06 NOTE — Telephone Encounter (Signed)
The patient would like to talk to RN or MD about her eliquis. She states that she  travels  a lot and her husband who is a ER doctor is worried about how much she will be taking, wanted to see if they can do more Aspirin

## 2023-09-07 NOTE — Telephone Encounter (Signed)
 Called, no answer. Detailed VM left for pt letting her know that Dr. Wilhelmenia Harada can discuss concerns with her at visit next week on 2/13. She call back if she feels this is urgent and needs to be seen sooner

## 2023-09-12 ENCOUNTER — Ambulatory Visit: Payer: PRIVATE HEALTH INSURANCE

## 2023-09-14 ENCOUNTER — Emergency Department (HOSPITAL_BASED_OUTPATIENT_CLINIC_OR_DEPARTMENT_OTHER): Payer: PRIVATE HEALTH INSURANCE

## 2023-09-14 ENCOUNTER — Encounter (HOSPITAL_BASED_OUTPATIENT_CLINIC_OR_DEPARTMENT_OTHER): Payer: Self-pay | Admitting: Emergency Medicine

## 2023-09-14 ENCOUNTER — Emergency Department (HOSPITAL_BASED_OUTPATIENT_CLINIC_OR_DEPARTMENT_OTHER)
Admission: EM | Admit: 2023-09-14 | Discharge: 2023-09-14 | Disposition: A | Discharge status: Home / Self Care | Payer: PRIVATE HEALTH INSURANCE | Attending: Emergency Medicine | Admitting: Emergency Medicine

## 2023-09-14 ENCOUNTER — Other Ambulatory Visit: Payer: Self-pay

## 2023-09-14 DIAGNOSIS — E882 Lipomatosis, not elsewhere classified: Secondary | ICD-10-CM | POA: Insufficient documentation

## 2023-09-14 DIAGNOSIS — Z86718 Personal history of other venous thrombosis and embolism: Secondary | ICD-10-CM | POA: Diagnosis not present

## 2023-09-14 DIAGNOSIS — R0789 Other chest pain: Secondary | ICD-10-CM | POA: Insufficient documentation

## 2023-09-14 DIAGNOSIS — M797 Fibromyalgia: Secondary | ICD-10-CM | POA: Insufficient documentation

## 2023-09-14 DIAGNOSIS — R059 Cough, unspecified: Secondary | ICD-10-CM | POA: Insufficient documentation

## 2023-09-14 LAB — CBC
HCT: 41.6 % (ref 36.0–46.0)
Hemoglobin: 13.7 g/dL (ref 12.0–15.0)
MCH: 31.1 pg (ref 26.0–34.0)
MCHC: 32.9 g/dL (ref 30.0–36.0)
MCV: 94.3 fL (ref 80.0–100.0)
Platelets: 307 10*3/uL (ref 150–400)
RBC: 4.41 MIL/uL (ref 3.87–5.11)
RDW: 13 % (ref 11.5–15.5)
WBC: 9.9 10*3/uL (ref 4.0–10.5)
nRBC: 0 % (ref 0.0–0.2)

## 2023-09-14 LAB — BASIC METABOLIC PANEL
Anion gap: 11 (ref 5–15)
BUN: 16 mg/dL (ref 6–20)
CO2: 24 mmol/L (ref 22–32)
Calcium: 8.8 mg/dL — ABNORMAL LOW (ref 8.9–10.3)
Chloride: 104 mmol/L (ref 98–111)
Creatinine, Ser: 1.09 mg/dL — ABNORMAL HIGH (ref 0.44–1.00)
GFR, Estimated: 59 mL/min — ABNORMAL LOW (ref >60)
Glucose, Bld: 135 mg/dL — ABNORMAL HIGH (ref 70–99)
Potassium: 3.9 mmol/L (ref 3.5–5.1)
Sodium: 139 mmol/L (ref 135–145)

## 2023-09-14 LAB — D-DIMER, QUANTITATIVE: D-Dimer, Quant: 0.27 ug{FEU}/mL (ref 0.00–0.50)

## 2023-09-14 LAB — TROPONIN I (HIGH SENSITIVITY)
Troponin I (High Sensitivity): 2 ng/L (ref <18)
Troponin I (High Sensitivity): <2 ng/L (ref <18)

## 2023-09-14 MED ORDER — ONDANSETRON HCL 4 MG/2ML IJ SOLN
4.0000 mg | Freq: Once | INTRAMUSCULAR | Status: AC
  Administered 2023-09-14: 4 mg via INTRAVENOUS
  Filled 2023-09-14: qty 2

## 2023-09-14 MED ORDER — KETOROLAC TROMETHAMINE 30 MG/ML IJ SOLN
30.0000 mg | Freq: Once | INTRAMUSCULAR | Status: AC
  Administered 2023-09-14: 30 mg via INTRAVENOUS
  Filled 2023-09-14: qty 1

## 2023-09-14 NOTE — ED Provider Notes (Signed)
[place] EMERGENCY DEPARTMENT AT [facility] Provider Note   CSN: [id number] Arrival date & time: 09/14/23  4098     History  Chief Complaint  Patient presents with   Chest Pain    [name] [name] is a 58 y.o. female.  Patient is a 58 year old female with past medical history of Dercum's disease, DVT, fibromyalgia.  Patient presenting today with complaints of chest discomfort.  She reports a 3-week history of persistent cough for which she was given amoxicillin.  She began to improve, then 3 days ago started noticing discomfort between her shoulder blades.  This pain worsened this evening and is now radiating to her right side.  The pain is worse when she breathes or moves and not associated with any trauma.  She denies shortness of breath, productive cough, fevers, or chills.  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  No history of PE.  No prior cardiac history and had a calcium score of 0 in 2021.  The history is provided by the patient.       Home Medications Prior to Admission medications   Medication Sig Start Date End Date Taking? Authorizing Provider  acetaminophen (TYLENOL) 500 MG tablet Take 500 mg by mouth every 6 (six) hours as needed for mild pain.    [provider]  apixaban (ELIQUIS) 2.5 MG TABS tablet Take 1 tablet (2.5 mg total) by mouth See admin instructions. Take Eliquis 2.5mg  BID for prophylaxis if you anticipate immobilization 03/18/23   Rickard Patience, MD  Ciclopirox 1 % shampoo USE AS DIRECTED ON SCALP. 02/15/23   [provider]  clobetasol (TEMOVATE) 0.05 % external solution Apply topically. 02/15/23   [provider]  diclofenac Sodium (VOLTAREN) 1 % GEL APPLY 2 GRAMS TO THE AFFECTED AREA(S) BY TOPICAL ROUTE 4 TIMES PER DAY 02/16/23   [provider]  EPINEPHrine (EPIPEN 2-PAK) 0.3 mg/0.3 mL IJ SOAJ injection Inject 0.3 mg into the muscle as needed. 02/14/23   Ellamae Sia, DO   ezetimibe (ZETIA) 10 MG tablet Take 1 tablet (10 mg total) by mouth daily. 02/21/23   Jake Bathe, MD  icosapent Ethyl (VASCEPA) 1 g capsule Take 2 capsules (2 g total) by mouth 2 (two) times daily. 02/21/23   Jake Bathe, MD  ipratropium (ATROVENT) 0.03 % nasal spray Place 1-2 sprays into both nostrils 2 (two) times daily as needed (nasal drainage). 11/25/21   Ellamae Sia, DO  ketoconazole (NIZORAL) 2 % cream Apply 1 Application topically daily. 11/16/22   Louann Sjogren, DPM  ketoconazole (NIZORAL) 2 % cream Apply 1 Application topically daily. 02/14/23   Vivi Barrack, DPM  levocetirizine (XYZAL) 5 MG tablet Take 1 tablet (5 mg total) by mouth every evening. 02/14/23   Ellamae Sia, DO  metroNIDAZOLE (METROGEL) 0.75 % gel metronidazole 0.75 % topical gel  APPLY ON THE SKIN DAILY FOR ROSACEA    [provider]  olmesartan (BENICAR) 20 MG tablet Take 20 mg by mouth daily. 11/03/18   [provider]  Olopatadine-Mometasone (Cristal Generous) [phone] MCG/ACT SUSP Place 1-2 sprays into the nose in the morning and at bedtime. 03/17/23   Ellamae Sia, DO  ondansetron (ZOFRAN-ODT) 4 MG disintegrating tablet Take by mouth. 12/15/22   [provider]  RABEprazole (ACIPHEX) 20 MG tablet Take 20 mg by mouth 2 (two) times daily.    [provider]  rizatriptan (MAXALT-MLT) 10 MG disintegrating tablet Take  10 mg by mouth every 2 (two) hours as needed for migraine.  06/18/15   [provider]  rosuvastatin (CRESTOR) 5 MG tablet Take 1 tablet (5 mg total) by mouth daily. 02/21/23   Jake Bathe, MD  scopolamine (TRANSDERM-SCOP) 1 MG/3DAYS Place 1 patch (1.5 mg total) onto the skin every 3 (three) days. 12/17/22   Jonah Blue, MD  Vitamin D, Ergocalciferol, (DRISDOL) 50000 units CAPS capsule Take 50,000 Units by mouth every Wednesday.     [provider]      Allergies    Augmentin [amoxicillin-pot clavulanate], Betadine [povidone iodine], Contrast media  [iodinated contrast media], Morphine and codeine, Prednisone, Yellow jacket venom [bee venom], Azithromycin, Erythromycin, Iohexol, Oxycontin [oxycodone], Avelox [moxifloxacin hcl in nacl], Codeine, Gadolinium derivatives, Iodine, Lamisil [terbinafine], Levaquin [levofloxacin in d5w], Oxycodone-acetaminophen, Pantoprazole, Praluent [alirocumab], Pravastatin, Rosuvastatin, Shellfish allergy, Dilaudid [hydromorphone hcl], Oxycodone hcl, and Tramadol    Review of Systems   Review of Systems  All other systems reviewed and are negative.   Physical Exam Updated Vital Signs BP 115/79   Pulse 99   Temp 98.2 F (36.8 C)   Resp 18   Ht 5\' 5"  (1.651 m)   Wt 90 kg   SpO2 98%   BMI 33.02 kg/m  Physical Exam Vitals and nursing note reviewed.  Constitutional:      General: She is not in acute distress.    Appearance: She is well-developed. She is not diaphoretic.  HENT:     Head: Normocephalic and atraumatic.  Cardiovascular:     Rate and Rhythm: Normal rate and regular rhythm.     Heart sounds: No murmur heard.    No friction rub. No gallop.  Pulmonary:     Effort: Pulmonary effort is normal. No respiratory distress.     Breath sounds: Normal breath sounds. No wheezing.  Abdominal:     General: Bowel sounds are normal. There is no distension.     Palpations: Abdomen is soft.     Tenderness: There is no abdominal tenderness.  Musculoskeletal:        General: Normal range of motion.     Cervical back: Normal range of motion and neck supple.     Right lower leg: No tenderness. No edema.     Left lower leg: No tenderness. No edema.     Comments: [name]' sign is absent bilaterally.  Skin:    General: Skin is warm and dry.  Neurological:     General: No focal deficit present.     Mental Status: She is alert and oriented to person, place, and time.     ED Results / Procedures / Treatments   Labs (all labs ordered are listed, but only abnormal results are displayed) Labs Reviewed   CBC  BASIC METABOLIC PANEL  D-DIMER, QUANTITATIVE  TROPONIN I (HIGH SENSITIVITY)    EKG EKG Interpretation Date/Time:  Wednesday September 14 2023 00:36:46 EST Ventricular Rate:  109 PR Interval:  158 QRS Duration:  74 QT Interval:  322 QTC Calculation: 433 R Axis:   49  Text Interpretation: Sinus tachycardia Cannot rule out Anterior infarct , age undetermined Abnormal ECG Confirmed by Geoffery Lyons ([id number]) on 09/14/2023 12:41:55 AM  Radiology DG Chest Port 1 View Result Date: 09/14/2023 CLINICAL DATA:  Central chest pain radiating to the right back between the shoulder blades. Shortness of breath. EXAM: PORTABLE CHEST 1 VIEW COMPARISON:  12/18/2022 FINDINGS: Stable cardiomediastinal silhouette. Low lung volumes accentuate pulmonary vascularity. No focal consolidation, pleural effusion,  or pneumothorax. No displaced rib fractures. IMPRESSION: No active disease. Electronically Signed   By: Minerva Fester M.D.   On: 09/14/2023 01:04    Procedures Procedures    Medications Ordered in ED Medications  ketorolac (TORADOL) 30 MG/ML injection 30 mg (has no administration in time range)    ED Course/ Medical Decision Making/ A&P  Patient is a 58 year old female with history of Dercum's Disease, prior DVT.  Patient presenting today with complaints of pain between her shoulder blades radiating to her right side.  Symptoms started earlier today following a 3-week history of persistent cough.  Patient arrives with stable vital signs and is afebrile.  There is no hypoxia.  Physical examination is basically unremarkable.  Laboratory studies obtained including CBC, metabolic panel, troponin x 2, and D-dimer.  All studies are basically unremarkable.  There is no elevation of white count and troponin x 2 are normal.  D-dimer is negative.  Chest x-ray obtained showing no acute process.  CT of the abdomen with renal protocol obtained showing no acute intra-abdominal process.  She has received IV  Toradol with some relief of her discomfort.  Patient's symptoms are most likely musculoskeletal in nature.  I feel as though I have ruled out a cardiac etiology.  Her EKG is unchanged, troponin is negative x 2, and she has had a calcium scoring in the past 3 years that was 0.  I have also considered pulmonary embolism, but did feel as though that has been ruled out with a negative D-dimer.  Also there is no evidence for dissection and I highly doubt this possibility.  Patient to be discharged with NSAIDs, rest, and as needed return.  Final Clinical Impression(s) / ED Diagnoses Final diagnoses:  None    Rx / DC Orders ED Discharge Orders     None         Geoffery Lyons, MD 09/14/23 [phone]

## 2023-09-14 NOTE — Progress Notes (Deleted)
 Follow Up Note  RE: [name] [name] MRN: [id number] DOB: 1966-07-18 Date of Office Visit: 09/15/2023  Referring provider: Cleatis Polka., MD Primary care provider: Cleatis Polka., MD  Chief Complaint: No chief complaint on file.  History of Present Illness: I had the pleasure of seeing [name] [name] for a follow up visit at the [facility] on 09/14/2023. She is a 58 y.o. 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 Her previous allergy office visit was on 03/17/2023 with Dr. Selena Batten. Today is a regular follow up visit.  Discussed the use of AI scribe software for clinical note transcription with the patient, who gave verbal consent to proceed.  History of Present Illness            ***  Assessment and Plan: [name] [name] is a 58 y.o. female with: Hymenoptera allergy Past history - respiratory symptoms after getting stung by a yellow jacket. 2020 Hymenoptera panel was positive to honeybee, white faced hornet, yellow jacket, wasp, yellow hornet and bumblebee. Borderline to fire ant. Used Epipen in June 2023 after an ant sting. Interim history - 2024 bloodwork positive to white face hornet, yellow jacket, wasp and yellow hornet; borderline to fire ant. No stings. Discussed at length that patient never reached maintenance and not sure how much her previous injections helped. It is important to come on time for the injections to be able to build up where there is known efficacy from the injections.  Restart mixed vespid and wasp as per protocol - last time to restart. Make sure you take your allergy medication the day of injection. Make sure you bring your Epipen for every visit and wait 30 minutes at the office.  Recommend coming in weekly so we can build up to the maintenance dose where it is the most effective.    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 Shellfish panel and seafood panel were all negative. The scallop and oyster were borderline positive. Tryptase, blood count, urticaria index, and alpha gal were all normal. 2,3 Dinor-11Beta-Prostaglandin F2 Alpha, Urine - 2157pg/mg Cr (ref <5205pg/mg Cr) - normal.  Interim history - reaction to contrast? And used Epipen in May.  Consider Xolair injections (300mg  every 4 weeks) - this is used sometimes off label for allergic reactions. Continue to avoid scallops and red dye. Keep track of reactions. If you have a reaction next time, please get tryptase level drawn within 2-3 hours. This will help in identifying if it's truly an allergic reaction or some other type of adverse reaction that you are having.  For mild symptoms you can take over the counter antihistamines such as Benadryl 1-2 tablets = 25-50mg  and monitor symptoms closely. If symptoms worsen or if you have severe symptoms including breathing issues, throat closure, significant swelling, whole body hives, severe diarrhea and vomiting, lightheadedness then inject epinephrine and seek immediate medical care afterwards. Emergency action plan in place.    Multiple drug allergies Past history - Patient is sensitive to various medications. Some of the reactions are more adverse drug reactions rather than IgE mediated reactions. Continue to avoid medications which gave her issues in the past. See allergy list.    Seasonal allergic rhinitis due to pollen Past history -  2024 bloodwork positive to grass. Use Ryaltris (olopatadine + mometasone nasal spray combination) 1-2 sprays per nostril twice a day.  Use over the counter antihistamines such as Zyrtec (cetirizine), Claritin (loratadine), Allegra (fexofenadine), or Xyzal (levocetirizine) daily as needed. May  take twice a day during allergy flares. May switch antihistamines every few months. Nasal saline spray (i.e., Simply Saline) or nasal saline lavage (i.e., NeilMed) is recommended as needed and prior to medicated nasal sprays. May use olopatadine eye drops 0.2% once a day as needed for itchy/watery eyes Continue environmental control measures to pollen.    Dercum disease Past history - Patient was diagnosed with Dercum's disease by dermatology at [facility]. Continue follow up with dermatology, neurology, cardiology. Assessment and Plan              No follow-ups on file.  No orders of the defined types were placed in this encounter.  Lab Orders  No laboratory test(s) ordered today    Diagnostics: Spirometry:  Tracings reviewed. Her effort: {Blank single:19197::"Good reproducible efforts.","It was hard to get consistent efforts and there is a question as to whether this reflects a maximal maneuver.","Poor effort, data can not be interpreted."} FVC: ***L FEV1: ***L, ***% predicted FEV1/FVC ratio: ***% Interpretation: {Blank single:19197::"Spirometry consistent with mild obstructive disease","Spirometry consistent with moderate obstructive disease","Spirometry consistent with severe obstructive disease","Spirometry consistent with possible restrictive disease","Spirometry consistent with mixed obstructive and restrictive disease","Spirometry uninterpretable due to technique","Spirometry consistent with normal pattern","No overt abnormalities noted given today's efforts"}.  Please see scanned spirometry results for details.  Skin Testing: {Blank single:19197::"Select foods","Environmental allergy panel","Environmental allergy panel and select foods","Food allergy panel","None","Deferred due to recent antihistamines use"}. *** Results discussed with patient/family.   Medication List:  Current Outpatient Medications  Medication Sig Dispense Refill   acetaminophen (TYLENOL) 500 MG  tablet Take 500 mg by mouth every 6 (six) hours as needed for mild pain.     apixaban (ELIQUIS) 2.5 MG TABS tablet Take 1 tablet (2.5 mg total) by mouth See admin instructions. Take Eliquis 2.5mg  BID for prophylaxis if you anticipate immobilization 60 tablet 0   Ciclopirox 1 % shampoo USE AS DIRECTED ON SCALP.     clobetasol (TEMOVATE) 0.05 % external solution Apply topically.     diclofenac Sodium (VOLTAREN) 1 % GEL APPLY 2 GRAMS TO THE AFFECTED AREA(S) BY TOPICAL ROUTE 4 TIMES PER DAY     EPINEPHrine (EPIPEN 2-PAK) 0.3 mg/0.3 mL IJ SOAJ injection Inject 0.3 mg into the muscle as needed. 1 each 1   ezetimibe (ZETIA) 10 MG tablet Take 1 tablet (10 mg total) by mouth daily. 90 tablet 3   icosapent Ethyl (VASCEPA) 1 g capsule Take 2 capsules (2 g total) by mouth 2 (two) times daily. 360 capsule 3   ipratropium (ATROVENT) 0.03 % nasal spray Place 1-2 sprays into both nostrils 2 (two) times daily as needed (nasal drainage). 30 mL 5   ketoconazole (NIZORAL) 2 % cream Apply 1 Application topically daily. 60 g 2   ketoconazole (NIZORAL) 2 % cream Apply 1 Application topically daily. 60 g 0   levocetirizine (XYZAL) 5 MG tablet Take 1 tablet (5 mg total) by mouth every evening. 30 tablet 5   metroNIDAZOLE (METROGEL) 0.75 % gel metronidazole 0.75 % topical gel  APPLY ON THE SKIN DAILY FOR ROSACEA     olmesartan (BENICAR) 20 MG tablet Take 20 mg by mouth daily.     Olopatadine-Mometasone (RYALTRIS) [id number] MCG/ACT SUSP Place 1-2 sprays into the nose in the morning and  at bedtime. 29 g 5   ondansetron (ZOFRAN-ODT) 4 MG disintegrating tablet Take by mouth.     RABEprazole (ACIPHEX) 20 MG tablet Take 20 mg by mouth 2 (two) times daily.     rizatriptan (MAXALT-MLT) 10 MG disintegrating tablet Take 10 mg by mouth every 2 (two) hours as needed for migraine.   11   rosuvastatin (CRESTOR) 5 MG tablet Take 1 tablet (5 mg total) by mouth daily. 90 tablet 3   scopolamine (TRANSDERM-SCOP) 1 MG/3DAYS Place 1 patch (1.5  mg total) onto the skin every 3 (three) days. 10 patch 1   Vitamin D, Ergocalciferol, (DRISDOL) 50000 units CAPS capsule Take 50,000 Units by mouth every Wednesday.      No current facility-administered medications for this visit.   Allergies: Allergies  Allergen Reactions   Augmentin [Amoxicillin-Pot Clavulanate] Anaphylaxis and Nausea And Vomiting   Betadine [Povidone Iodine]     Throat swelling, itchy lips, redness/swelling at application site.   Contrast Media [Iodinated Contrast Media] Anaphylaxis   Morphine And Codeine Anaphylaxis and Nausea And Vomiting   Prednisone Anaphylaxis   Yellow Jacket Venom [Bee Venom] Anaphylaxis   Azithromycin Other (See Comments)    SEVERE STOMACH PAIN    Erythromycin Nausea And Vomiting and Other (See Comments)    SEVERE STOMACH PAIN/abdominal pain    Iohexol Hives, Itching and Swelling    Swelling of upper lip, thick tongue, hives on face and back and itching all over   Oxycontin [Oxycodone] Nausea And Vomiting   Avelox [Moxifloxacin Hcl In Nacl] Swelling   Codeine Nausea And Vomiting and Other (See Comments)    Headaches also   Gadolinium Derivatives Hives, Itching and Swelling   Iodine    Lamisil [Terbinafine]     Pt stated, "It made me feel like I had the flu"   Levaquin [Levofloxacin In D5w] Swelling   Oxycodone-Acetaminophen Nausea And Vomiting   Pantoprazole Other (See Comments)    "Feels like I have the flu"   Praluent [Alirocumab]     Swelling in leg, pain, dizziness   Pravastatin     Myalgias on 20mg  daily   Rosuvastatin     Myalgias and cramps, sore throat, indigestion on 10mg , tolerates 5mg  ok   Shellfish Allergy     Intolerant to scallops, can eat fish   Dilaudid [Hydromorphone Hcl] Nausea And Vomiting   Oxycodone Hcl Nausea And Vomiting   Tramadol Nausea Only   I reviewed her past medical history, social history, family history, and environmental history and no significant changes have been reported from her previous  visit.  Review of Systems  Constitutional:  Negative for appetite change, chills, fever and unexpected weight change.  HENT:  Negative for congestion and rhinorrhea.   Eyes:  Negative for itching.  Respiratory:  Negative for cough, chest tightness, shortness of breath and wheezing.   Cardiovascular:  Negative for chest pain.  Gastrointestinal:  Negative for abdominal pain.  Genitourinary:  Negative for difficulty urinating.  Skin:  Negative for rash.  Allergic/Immunologic: Positive for environmental allergies.  Neurological:  Negative for headaches.    Objective: There were no vitals taken for this visit. There is no height or weight on file to calculate BMI. Physical Exam Vitals and nursing note reviewed.  Constitutional:      Appearance: Normal appearance. She is well-developed.  HENT:     Head: Normocephalic and atraumatic.     Right Ear: Tympanic membrane and external ear normal.     Left Ear: Tympanic  membrane and external ear normal.     Nose: Nose normal.     Mouth/Throat:     Mouth: Mucous membranes are moist.     Pharynx: Oropharynx is clear.  Eyes:     Conjunctiva/sclera: Conjunctivae normal.  Cardiovascular:     Rate and Rhythm: Normal rate and regular rhythm.     Heart sounds: Normal heart sounds. No murmur heard.    No friction rub. No gallop.  Pulmonary:     Effort: Pulmonary effort is normal.     Breath sounds: Normal breath sounds. No wheezing, rhonchi or rales.  Musculoskeletal:     Cervical back: Neck supple.  Skin:    General: Skin is warm.     Findings: No rash.  Neurological:     Mental Status: She is alert and oriented to person, place, and time.  Psychiatric:        Behavior: Behavior normal.    Previous notes and tests were reviewed. The plan was reviewed with the patient/family, and all questions/concerned were addressed.  It was my pleasure to see [name] [name] today and participate in her care. Please feel free to contact me with any questions  or concerns.  Sincerely,  Wyline Mood, DO Allergy & Immunology  [facility] office: [phone] [facility] office: [phone]

## 2023-09-14 NOTE — Discharge Instructions (Signed)
Begin taking ibuprofen 600 mg every 6 hours as needed for pain.  Follow-up with primary doctor if not improving, and return to the ER if symptoms significantly worsen or change.

## 2023-09-14 NOTE — ED Triage Notes (Signed)
Pt to ED from home c/o central chest pain radiating to right side and through to back between shoulder blades for approx 5 days.  SOB as well, pain with inspirations.  States cough for several weeks and on ABX.  Also c/o swelling to bilat hands.  Hx of DVT's, no obvious swelling to extremities.

## 2023-09-14 NOTE — ED Notes (Signed)
ED Provider at bedside.

## 2023-09-15 ENCOUNTER — Inpatient Hospital Stay: Payer: PRIVATE HEALTH INSURANCE | Admitting: Oncology

## 2023-09-15 ENCOUNTER — Inpatient Hospital Stay: Payer: PRIVATE HEALTH INSURANCE

## 2023-09-15 ENCOUNTER — Ambulatory Visit: Payer: PRIVATE HEALTH INSURANCE | Admitting: Allergy

## 2023-09-15 ENCOUNTER — Ambulatory Visit (INDEPENDENT_AMBULATORY_CARE_PROVIDER_SITE_OTHER): Payer: PRIVATE HEALTH INSURANCE

## 2023-09-15 DIAGNOSIS — Z91038 Other insect allergy status: Secondary | ICD-10-CM | POA: Diagnosis not present

## 2023-09-19 NOTE — Progress Notes (Unsigned)
 Follow Up Note  RE: [name] [name] MRN: [id number] DOB: 04-30-1966 Date of Office Visit: 09/20/2023  Referring provider: Cleatis Polka., MD Primary care provider: Cleatis Polka., MD  Chief Complaint: No chief complaint on file.  History of Present Illness: I had the pleasure of seeing [name] [name] for a follow up visit at the [facility] on 09/19/2023. She is a 58 y.o. 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 Her previous allergy office visit was on 03/17/2023 with Dr. Selena Batten. Today is a regular follow up visit.  Discussed the use of AI scribe software for clinical note transcription with the patient, who gave verbal consent to proceed.  History of Present Illness            ***  Assessment and Plan: [name] [name] is a 58 y.o. female with: Hymenoptera allergy Past history - respiratory symptoms after getting stung by a yellow jacket. 2020 Hymenoptera panel was positive to honeybee, white faced hornet, yellow jacket, wasp, yellow hornet and bumblebee. Borderline to fire ant. Used Epipen in June 2023 after an ant sting. Interim history - 2024 bloodwork positive to white face hornet, yellow jacket, wasp and yellow hornet; borderline to fire ant. No stings. Discussed at length that patient never reached maintenance and not sure how much her previous injections helped. It is important to come on time for the injections to be able to build up where there is known efficacy from the injections.  Restart mixed vespid and wasp as per protocol - last time to restart. Make sure you take your allergy medication the day of injection. Make sure you bring your Epipen for every visit and wait 30 minutes at the office.  Recommend coming in weekly so we can build up to the maintenance dose where it is the most effective.    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 Shellfish panel and seafood panel were all negative. The scallop and oyster were borderline positive. Tryptase, blood count, urticaria index, and alpha gal were all normal. 2,3 Dinor-11Beta-Prostaglandin F2 Alpha, Urine - 2157pg/mg Cr (ref <5205pg/mg Cr) - normal.  Interim history - reaction to contrast? And used Epipen in May.  Consider Xolair injections (300mg  every 4 weeks) - this is used sometimes off label for allergic reactions. Continue to avoid scallops and red dye. Keep track of reactions. If you have a reaction next time, please get tryptase level drawn within 2-3 hours. This will help in identifying if it's truly an allergic reaction or some other type of adverse reaction that you are having.  For mild symptoms you can take over the counter antihistamines such as Benadryl 1-2 tablets = 25-50mg  and monitor symptoms closely. If symptoms worsen or if you have severe symptoms including breathing issues, throat closure, significant swelling, whole body hives, severe diarrhea and vomiting, lightheadedness then inject epinephrine and seek immediate medical care afterwards. Emergency action plan in place.    Multiple drug allergies Past history - Patient is sensitive to various medications. Some of the reactions are more adverse drug reactions rather than IgE mediated reactions. Continue to avoid medications which gave her issues in the past. See allergy list.    Seasonal allergic rhinitis due to pollen Past history -  2024 bloodwork positive to grass. Use Ryaltris (olopatadine + mometasone nasal spray combination) 1-2 sprays per nostril twice a day.  Use over the counter antihistamines such as Zyrtec (cetirizine), Claritin (loratadine), Allegra (fexofenadine), or Xyzal (levocetirizine) daily as needed. May  take twice a day during allergy flares. May switch antihistamines every few months. Nasal saline spray (i.e., Simply Saline) or nasal saline lavage (i.e., NeilMed) is recommended as needed and prior to medicated nasal sprays. May use olopatadine eye drops 0.2% once a day as needed for itchy/watery eyes Continue environmental control measures to pollen.    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 Continue follow up with dermatology, neurology, cardiology. Assessment and Plan              No follow-ups on file.  No orders of the defined types were placed in this encounter.  Lab Orders  No laboratory test(s) ordered today    Diagnostics: Spirometry:  Tracings reviewed. Her effort: {Blank single:19197::"Good reproducible efforts.","It was hard to get consistent efforts and there is a question as to whether this reflects a maximal maneuver.","Poor effort, data can not be interpreted."} FVC: ***L FEV1: ***L, ***% predicted FEV1/FVC ratio: ***% Interpretation: {Blank single:19197::"Spirometry consistent with mild obstructive disease","Spirometry consistent with moderate obstructive disease","Spirometry consistent with severe obstructive disease","Spirometry consistent with possible restrictive disease","Spirometry consistent with mixed obstructive and restrictive disease","Spirometry uninterpretable due to technique","Spirometry consistent with normal pattern","No overt abnormalities noted given today's efforts"}.  Please see scanned spirometry results for details.  Skin Testing: {Blank single:19197::"Select foods","Environmental allergy panel","Environmental allergy panel and select foods","Food allergy panel","None","Deferred due to recent antihistamines use"}. *** Results discussed with patient/family.   Medication List:  Current Outpatient Medications  Medication Sig Dispense Refill  . acetaminophen (TYLENOL) 500 MG  tablet Take 500 mg by mouth every 6 (six) hours as needed for mild pain.    Marland Kitchen apixaban (ELIQUIS) 2.5 MG TABS tablet Take 1 tablet (2.5 mg total) by mouth See admin instructions. Take Eliquis 2.5mg  BID for prophylaxis if you anticipate immobilization 60 tablet 0  . Ciclopirox 1 % shampoo USE AS DIRECTED ON SCALP.    . clobetasol (TEMOVATE) 0.05 % external solution Apply topically.    . diclofenac Sodium (VOLTAREN) 1 % GEL APPLY 2 GRAMS TO THE AFFECTED AREA(S) BY TOPICAL ROUTE 4 TIMES PER DAY    . EPINEPHrine (EPIPEN 2-PAK) 0.3 mg/0.3 mL IJ SOAJ injection Inject 0.3 mg into the muscle as needed. 1 each 1  . ezetimibe (ZETIA) 10 MG tablet Take 1 tablet (10 mg total) by mouth daily. 90 tablet 3  . icosapent Ethyl (VASCEPA) 1 g capsule Take 2 capsules (2 g total) by mouth 2 (two) times daily. 360 capsule 3  . ipratropium (ATROVENT) 0.03 % nasal spray Place 1-2 sprays into both nostrils 2 (two) times daily as needed (nasal drainage). 30 mL 5  . ketoconazole (NIZORAL) 2 % cream Apply 1 Application topically daily. 60 g 2  . ketoconazole (NIZORAL) 2 % cream Apply 1 Application topically daily. 60 g 0  . levocetirizine (XYZAL) 5 MG tablet Take 1 tablet (5 mg total) by mouth every evening. 30 tablet 5  . metroNIDAZOLE (METROGEL) 0.75 % gel metronidazole 0.75 % topical gel  APPLY ON THE SKIN DAILY FOR ROSACEA    . olmesartan (BENICAR) 20 MG tablet Take 20 mg by mouth daily.    Marrion Coy (RYALTRIS) [id number] MCG/ACT SUSP Place 1-2 sprays into the nose in the morning and  at bedtime. 29 g 5  . ondansetron (ZOFRAN-ODT) 4 MG disintegrating tablet Take by mouth.    . RABEprazole (ACIPHEX) 20 MG tablet Take 20 mg by mouth 2 (two) times daily.    . rizatriptan (MAXALT-MLT) 10 MG disintegrating tablet Take 10 mg by mouth every 2 (two) hours as needed for migraine.   11  . rosuvastatin (CRESTOR) 5 MG tablet Take 1 tablet (5 mg total) by mouth daily. 90 tablet 3  . scopolamine (TRANSDERM-SCOP) 1 MG/3DAYS  Place 1 patch (1.5 mg total) onto the skin every 3 (three) days. 10 patch 1  . Vitamin D, Ergocalciferol, (DRISDOL) 50000 units CAPS capsule Take 50,000 Units by mouth every Wednesday.      No current facility-administered medications for this visit.   Allergies: Allergies  Allergen Reactions  . Augmentin [Amoxicillin-Pot Clavulanate] Anaphylaxis and Nausea And Vomiting  . Betadine [Povidone Iodine]     Throat swelling, itchy lips, redness/swelling at application site.  . Contrast Media [Iodinated Contrast Media] Anaphylaxis  . Morphine And Codeine Anaphylaxis and Nausea And Vomiting  . Prednisone Anaphylaxis  . Yellow Jacket Venom [Bee Venom] Anaphylaxis  . Azithromycin Other (See Comments)    SEVERE STOMACH PAIN   . Erythromycin Nausea And Vomiting and Other (See Comments)    SEVERE STOMACH PAIN/abdominal pain   . Iohexol Hives, Itching and Swelling    Swelling of upper lip, thick tongue, hives on face and back and itching all over  . Oxycontin [Oxycodone] Nausea And Vomiting  . Avelox [Moxifloxacin Hcl In Nacl] Swelling  . Codeine Nausea And Vomiting and Other (See Comments)    Headaches also  . Gadolinium Derivatives Hives, Itching and Swelling  . Iodine   . Lamisil [Terbinafine]     Pt stated, "It made me feel like I had the flu"  . Levaquin [Levofloxacin In D5w] Swelling  . Oxycodone-Acetaminophen Nausea And Vomiting  . Pantoprazole Other (See Comments)    "Feels like I have the flu"  . Praluent [Alirocumab]     Swelling in leg, pain, dizziness  . Pravastatin     Myalgias on 20mg  daily  . Rosuvastatin     Myalgias and cramps, sore throat, indigestion on 10mg , tolerates 5mg  ok  . Shellfish Allergy     Intolerant to scallops, can eat fish  . Dilaudid [Hydromorphone Hcl] Nausea And Vomiting  . Oxycodone Hcl Nausea And Vomiting  . Tramadol Nausea Only   I reviewed her past medical history, social history, family history, and environmental history and no significant  changes have been reported from her previous visit.  Review of Systems  Constitutional:  Negative for appetite change, chills, fever and unexpected weight change.  HENT:  Negative for congestion and rhinorrhea.   Eyes:  Negative for itching.  Respiratory:  Negative for cough, chest tightness, shortness of breath and wheezing.   Cardiovascular:  Negative for chest pain.  Gastrointestinal:  Negative for abdominal pain.  Genitourinary:  Negative for difficulty urinating.  Skin:  Negative for rash.  Allergic/Immunologic: Positive for environmental allergies.  Neurological:  Negative for headaches.    Objective: There were no vitals taken for this visit. There is no height or weight on file to calculate BMI. Physical Exam Vitals and nursing note reviewed.  Constitutional:      Appearance: Normal appearance. She is well-developed.  HENT:     Head: Normocephalic and atraumatic.     Right Ear: Tympanic membrane and external ear normal.     Left Ear: Tympanic  membrane and external ear normal.     Nose: Nose normal.     Mouth/Throat:     Mouth: Mucous membranes are moist.     Pharynx: Oropharynx is clear.  Eyes:     Conjunctiva/sclera: Conjunctivae normal.  Cardiovascular:     Rate and Rhythm: Normal rate and regular rhythm.     Heart sounds: Normal heart sounds. No murmur heard.    No friction rub. No gallop.  Pulmonary:     Effort: Pulmonary effort is normal.     Breath sounds: Normal breath sounds. No wheezing, rhonchi or rales.  Musculoskeletal:     Cervical back: Neck supple.  Skin:    General: Skin is warm.     Findings: No rash.  Neurological:     Mental Status: She is alert and oriented to person, place, and time.  Psychiatric:        Behavior: Behavior normal.   Previous notes and tests were reviewed. The plan was reviewed with the patient/family, and all questions/concerned were addressed.  It was my pleasure to see [name] [name] today and participate in her care. Please  feel free to contact me with any questions or concerns.  Sincerely,  Wyline Mood, DO Allergy & Immunology  [facility] office: [phone] [facility] office: [phone]

## 2023-09-20 ENCOUNTER — Ambulatory Visit: Payer: PRIVATE HEALTH INSURANCE

## 2023-09-20 ENCOUNTER — Ambulatory Visit (INDEPENDENT_AMBULATORY_CARE_PROVIDER_SITE_OTHER): Payer: PRIVATE HEALTH INSURANCE | Admitting: Allergy

## 2023-09-20 ENCOUNTER — Encounter: Payer: Self-pay | Admitting: Allergy

## 2023-09-20 VITALS — BP 112/74 | HR 84 | Temp 97.7°F | Resp 18 | Ht 65.0 in | Wt 244.5 lb

## 2023-09-20 DIAGNOSIS — T7840XD Allergy, unspecified, subsequent encounter: Secondary | ICD-10-CM

## 2023-09-20 DIAGNOSIS — J302 Other seasonal allergic rhinitis: Secondary | ICD-10-CM

## 2023-09-20 DIAGNOSIS — Z889 Allergy status to unspecified drugs, medicaments and biological substances status: Secondary | ICD-10-CM

## 2023-09-20 DIAGNOSIS — J301 Allergic rhinitis due to pollen: Secondary | ICD-10-CM

## 2023-09-20 DIAGNOSIS — Z91038 Other insect allergy status: Secondary | ICD-10-CM | POA: Diagnosis not present

## 2023-09-20 DIAGNOSIS — H9319 Tinnitus, unspecified ear: Secondary | ICD-10-CM

## 2023-09-20 MED ORDER — RYALTRIS 665-25 MCG/ACT NA SUSP: 1.0000 | Freq: Two times a day (BID) | NASAL | 5 refills | Status: DC

## 2023-09-20 MED ORDER — FEXOFENADINE HCL 180 MG PO TABS: 180.0000 mg | ORAL_TABLET | Freq: Two times a day (BID) | ORAL | 5 refills | Status: AC | PRN

## 2023-09-20 MED ORDER — EPINEPHRINE 0.3 MG/0.3ML IJ SOAJ: 0.3000 mg | INTRAMUSCULAR | 1 refills | Status: DC | PRN

## 2023-09-20 NOTE — Patient Instructions (Addendum)
 Hymenoptera allergy Continue mixed vespid injections (contains yellow jacket, yellow hornet and white faced hornet) and wasp - given today. Make sure you take your allergy medication the day of injection. Make sure you bring your Epipen for every visit and wait 30 minutes at the office.  Recommend coming in weekly so we can build up to the maintenance dose where it is the most effective.   Allergic reaction Continue to avoid scallops and red dye. Keep track of reactions. If you have a reaction next time, please get tryptase level drawn within 2-3 hours. This will help in identifying if it's truly an allergic reaction or some other type of adverse reaction that you are having.  For mild symptoms you can take over the counter antihistamines such as Benadryl 1-2 tablets = 25-50mg  and monitor symptoms closely. If symptoms worsen or if you have severe symptoms including breathing issues, throat closure, significant swelling, whole body hives, severe diarrhea and vomiting, lightheadedness then inject epinephrine and seek immediate medical care afterwards. Emergency action plan in place.    Allergic rhinitis Bloodwork was positive to grass pollen. Start Ryaltris (olopatadine + mometasone nasal spray combination) 1-2 sprays per nostril twice a day. Sample given. This replaces your other nasal sprays. If this works well for you, then have pharmacy ship the medication to your home - prescription already sent in.  Nasal saline spray (i.e., Simply Saline) or nasal saline lavage (i.e., NeilMed) is recommended as needed and prior to medicated nasal sprays. Use over the counter antihistamines such as Zyrtec (cetirizine), Claritin (loratadine), Allegra (fexofenadine), or Xyzal (levocetirizine) daily as needed. May take twice a day during allergy flares. May switch antihistamines every few months. May use olopatadine eye drops 0.2% once a day as needed for itchy/watery eyes   Ear Follow up with ENT as  scheduled.  Multiple drug allergies Continue to avoid medications on allergy list.     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  Return in about 6 months or sooner if needed.   Reducing Pollen Exposure Pollen seasons: trees (spring), grass (summer) and ragweed/weeds (fall). Keep windows closed in your home and car to lower pollen exposure.  Install air conditioning in the bedroom and throughout the house if possible.  Avoid going out in dry windy days - especially early morning. Pollen counts are highest between 5 - 10 AM and on dry, hot and windy days.  Save outside activities for late afternoon or after a heavy rain, when pollen levels are lower.  Avoid mowing of grass if you have grass pollen allergy. Be aware that pollen can also be transported indoors on people and pets.  Dry your clothes in an automatic dryer rather than hanging them outside where they might collect pollen.  Rinse hair and eyes before bedtime.  Buffered Isotonic Saline Irrigations:  Goal: When you irrigate with the isotonic saline (salt water) it washes mucous and other debris from your nose that could be contributing to your nasal symptoms.   Recipe: Obtain 1 quart jar that is clean Fill with clean (bottled, boiled or distilled) water Add 1-2 heaping teaspoons of salt without iodine If the solution with 2 teaspoons of salt is too strong, adjust the amount down until better tolerated Add 1 teaspoon of Arm [employer] baking soda (pure bicarbonate) Mix ingredients together and store at room temperature and discard after 1 week * Alternatively you can buy pre made salt packets for the NeilMed bottle or there  are other over the counter brands available  Instructions: Warm  cup of the solution in the microwave if desired but be careful not to overheat as this will burn the inside of your nose Stand over a sink (or do it while you shower) and squirt the solution into  one side of your nose aiming towards the back of your head Sometimes saying "coca cola" while irrigating can be helpful to prevent fluid from going down your throat  The solution will travel to the back of your nose and then come out the other side Perform this again on the other side Try to do this twice a day If you are using a nasal spray in addition to the irrigation, irrigate first and then use the topical nasal spray otherwise you will wash the nasal spray out of your nose

## 2023-09-21 ENCOUNTER — Encounter: Payer: Self-pay | Admitting: Allergy

## 2023-09-22 ENCOUNTER — Telehealth: Payer: Self-pay | Admitting: *Deleted

## 2023-09-22 NOTE — Telephone Encounter (Signed)
Noted in flowsheet 

## 2023-09-22 NOTE — Telephone Encounter (Signed)
-----   Message from Ellamae Sia sent at 09/21/2023  3:15 PM EST ----- Build up schedule:   For strength 9mcg/mL (0.05, 0.1, 0.2, 0.3, 0.4)   For strength 92mcg/mL (0.05, 0.1, 0.2, 0.3, 0.4)   For strength 362mcg/mL (0.05, 0.1 and increase by 0.1 until reached 1cc maintenance dose).

## 2023-10-03 ENCOUNTER — Inpatient Hospital Stay: Payer: PRIVATE HEALTH INSURANCE | Attending: Oncology

## 2023-10-03 ENCOUNTER — Encounter: Payer: Self-pay | Admitting: Oncology

## 2023-10-03 ENCOUNTER — Inpatient Hospital Stay: Payer: PRIVATE HEALTH INSURANCE | Admitting: Oncology

## 2023-10-03 VITALS — BP 121/91 | HR 80 | Temp 96.8°F | Resp 18 | Wt 238.8 lb

## 2023-10-03 DIAGNOSIS — Z883 Allergy status to other anti-infective agents status: Secondary | ICD-10-CM | POA: Insufficient documentation

## 2023-10-03 DIAGNOSIS — Z79899 Other long term (current) drug therapy: Secondary | ICD-10-CM | POA: Diagnosis not present

## 2023-10-03 DIAGNOSIS — Z888 Allergy status to other drugs, medicaments and biological substances status: Secondary | ICD-10-CM | POA: Diagnosis not present

## 2023-10-03 DIAGNOSIS — Z841 Family history of disorders of kidney and ureter: Secondary | ICD-10-CM | POA: Insufficient documentation

## 2023-10-03 DIAGNOSIS — Z8249 Family history of ischemic heart disease and other diseases of the circulatory system: Secondary | ICD-10-CM | POA: Diagnosis not present

## 2023-10-03 DIAGNOSIS — E669 Obesity, unspecified: Secondary | ICD-10-CM | POA: Diagnosis not present

## 2023-10-03 DIAGNOSIS — Z818 Family history of other mental and behavioral disorders: Secondary | ICD-10-CM | POA: Diagnosis not present

## 2023-10-03 DIAGNOSIS — I824Y1 Acute embolism and thrombosis of unspecified deep veins of right proximal lower extremity: Secondary | ICD-10-CM

## 2023-10-03 DIAGNOSIS — R0602 Shortness of breath: Secondary | ICD-10-CM | POA: Diagnosis not present

## 2023-10-03 DIAGNOSIS — Z823 Family history of stroke: Secondary | ICD-10-CM | POA: Diagnosis not present

## 2023-10-03 DIAGNOSIS — Z9049 Acquired absence of other specified parts of digestive tract: Secondary | ICD-10-CM | POA: Insufficient documentation

## 2023-10-03 DIAGNOSIS — Z880 Allergy status to penicillin: Secondary | ICD-10-CM | POA: Insufficient documentation

## 2023-10-03 DIAGNOSIS — Z91030 Bee allergy status: Secondary | ICD-10-CM | POA: Diagnosis not present

## 2023-10-03 DIAGNOSIS — Z885 Allergy status to narcotic agent status: Secondary | ICD-10-CM | POA: Insufficient documentation

## 2023-10-03 DIAGNOSIS — Z90710 Acquired absence of both cervix and uterus: Secondary | ICD-10-CM | POA: Diagnosis not present

## 2023-10-03 DIAGNOSIS — Z90722 Acquired absence of ovaries, bilateral: Secondary | ICD-10-CM | POA: Insufficient documentation

## 2023-10-03 DIAGNOSIS — Z814 Family history of other substance abuse and dependence: Secondary | ICD-10-CM | POA: Diagnosis not present

## 2023-10-03 DIAGNOSIS — Z86718 Personal history of other venous thrombosis and embolism: Secondary | ICD-10-CM | POA: Diagnosis present

## 2023-10-03 DIAGNOSIS — Z881 Allergy status to other antibiotic agents status: Secondary | ICD-10-CM | POA: Insufficient documentation

## 2023-10-03 DIAGNOSIS — R0789 Other chest pain: Secondary | ICD-10-CM | POA: Diagnosis not present

## 2023-10-03 DIAGNOSIS — Z8379 Family history of other diseases of the digestive system: Secondary | ICD-10-CM | POA: Diagnosis not present

## 2023-10-03 LAB — CBC WITH DIFFERENTIAL (CANCER CENTER ONLY)
Abs Immature Granulocytes: 0.02 10*3/uL (ref 0.00–0.07)
Basophils Absolute: 0 10*3/uL (ref 0.0–0.1)
Basophils Relative: 1 %
Eosinophils Absolute: 0.1 10*3/uL (ref 0.0–0.5)
Eosinophils Relative: 1 %
HCT: 42 % (ref 36.0–46.0)
Hemoglobin: 13.5 g/dL (ref 12.0–15.0)
Immature Granulocytes: 0 %
Lymphocytes Relative: 37 %
Lymphs Abs: 2.6 10*3/uL (ref 0.7–4.0)
MCH: 29.9 pg (ref 26.0–34.0)
MCHC: 32.1 g/dL (ref 30.0–36.0)
MCV: 93.1 fL (ref 80.0–100.0)
Monocytes Absolute: 0.4 10*3/uL (ref 0.1–1.0)
Monocytes Relative: 6 %
Neutro Abs: 3.9 10*3/uL (ref 1.7–7.7)
Neutrophils Relative %: 55 %
Platelet Count: 289 10*3/uL (ref 150–400)
RBC: 4.51 MIL/uL (ref 3.87–5.11)
RDW: 13 % (ref 11.5–15.5)
WBC Count: 7.1 10*3/uL (ref 4.0–10.5)
nRBC: 0 % (ref 0.0–0.2)

## 2023-10-03 NOTE — Assessment & Plan Note (Addendum)
 Based on the clinical history, she had a provoked DVT due to immobilization from her long road trip, and being on estrogen replacement. -per patient, she was on aspirin 81 mg at that time. Repeat right lower extremity complete resolution.  previous hypercoagulable workup showed Antithrombin activity 148 slightly elevated, Protein C 143 protein S 83 wnl. -Obtained by PCP, results were scanned into EMR.Negative factor V leiden mutation, negative prothrombin mutation, negative antiphospholipid syndrome antibodies.  Negative beta-2 glycoprotein antibodies.  Patient has finished 3 months of anticoagulation with Eliquis 5 mg twice daily.  Currently on aspirin 81 mg daily I have previously discussed with her about recommendation of switching to Eliquis 2.5 mg twice daily if she expects prolonged immobilization, i.e. flights, long distance road trip, etc. Patient expresses concerns of bleeding risk of low dose Eliquis, although she has not experienced any bleeding events so far. The role of aspirin and optimized dose to prevent recurrent VTE are less defined. If she decides not to take Eliquis, at least I recommend her to continue Aspirin 81mg  daily as prophylaxis I recommend  ambulating and performing leg exercises while seated. Compression stockings may be appropriate for if she has leg edema.I discussed about the awareness of signs and symptoms of VTE requiring prompt medical attention.

## 2023-10-03 NOTE — Progress Notes (Signed)
 Hematology/Oncology Progress note [phone] [phone]           REFERRING PROVIDER: Cleatis Polka., MD   CHIEF COMPLAINTS/REASON FOR VISIT:  History of DVT   ASSESSMENT & PLAN:   History of DVT (deep vein thrombosis) Based on the clinical history, she had a provoked DVT due to immobilization from her long road trip, and being on estrogen replacement. -per patient, she was on aspirin 81 mg at that time. Repeat right lower extremity complete resolution.  previous hypercoagulable workup showed Antithrombin activity 148 slightly elevated, Protein C 143 protein S 83 wnl. -Obtained by PCP, results were scanned into EMR.Negative factor V leiden mutation, negative prothrombin mutation, negative antiphospholipid syndrome antibodies.  Negative beta-2 glycoprotein antibodies.  Patient has finished 3 months of anticoagulation with Eliquis 5 mg twice daily.  Currently on aspirin 81 mg daily I have previously discussed with her about recommendation of switching to Eliquis 2.5 mg twice daily if she expects prolonged immobilization, i.e. flights, long distance road trip, etc. Patient expresses concerns of bleeding risk of low dose Eliquis, although she has not experienced any bleeding events so far. The role of aspirin and optimized dose to prevent recurrent VTE are less defined. If she decides not to take Eliquis, at least I recommend her to continue Aspirin 81mg  daily as prophylaxis I recommend  ambulating and performing leg exercises while seated. Compression stockings may be appropriate for if she has leg edema.I discussed about the awareness of signs and symptoms of VTE requiring prompt medical attention.      Patient is discharged from my clinic. I recommend patient to continue follow up with primary care physician. Patient may re-establish care in the future if clinically indicated. A total of 25 minutes was spent on this visit.  With 5 minutes spent reviewing  image findings, 15 minutes counseling the patient on the diagnosis and prophylactic anticoagulation plan.  Additional 5 minutes was spent on answering patient's questions.  All questions were answered to patient's satisfaction.   All questions were answered. The patient knows to call the clinic with any problems, questions or concerns.  Rickard Patience, MD, PhD [facility] Hematology Oncology 10/03/2023   HISTORY OF PRESENTING ILLNESS:   [name] [name] is a  58 y.o.  female with PMH listed below was seen in consultation at the request of  Cleatis Polka., MD  for evaluation of recent history of right lower extremity DVT.  - DVT developed in April 2024 after a long road trip (8-9 hours of driving per day over 3 days, with some breaks) - Had been experiencing pain on the side and behind the right knee for 3 weeks prior to the road trip, thought to be due to lipomas in that area, Pain was severe, felt like being hit with a crowbar, and radiated to the calf and thigh, - Leg became warm and swollen in the affected area -11/24/2022, patient presented emergency room and 1. Nonocclusive DVT is observed in the right superficial femoral vein midportion. There is also calf vein DVT in the anterior tibial and posterior tibial veins.  Patient was started on Eliquis starting packet with 10 mg twice daily for 1 week followed by 5 mg twice daily. Patient was referred to establish care with hematology for further evaluation. Since the start of Eliquis, patient reports slight improvement of right lower extremity swelling and the pain. She has felt some shortness of breath and chest pain.  She feels that this  could be secondary to the lipomas in the chest area. 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 She expresses frustration about lack of medical expertise and support for managing this rare condition  She is concerned about the possibility of lipomas entering the bloodstream based on her reading  12/13/2022 CT angiogram PE protocol was obtained and was negative for pulmonary embolism. She has contrast allergy 5/16 - 12/17/2022, admitted due to acute gastroenteritis 12/18/2022, ER visit due to concern of allergic reaction.  She self administered his EpiPen at home prior to EMS transport.  Symptoms improved after she arrived to the ER.  She was treated with a course of prednisone orally.  03/07/2023 right lower extremity ultrasound showed no right lower extremity DVT  INTERVAL HISTORY [name] [name] is a 58 y.o. female who has above history reviewed by me today presents for follow up visit for history of right lower extremity DVT.  Patient is current daily on aspirin 81 mg daily.  Following my instruction, she will stop aspirin 81 mg daily and take prophylactic Eliquis 2.5 mg twice daily if she expects situation with prolonged seating.  She has followed this instruction during her recent trips to [place] and [place]. She is proactive in managing her risk by ambulating frequently during flights and car trips, stopping every two hours to walk for fifteen minutes.  Three weeks ago, she experienced chest pain and shortness of breath, prompting an emergency room visit. Due to her history of blood clots and recent travel, she was concerned about these symptoms. A D-dimer test was performed in the hospital, and the results were normal  Per patient, her husband, who is an ER physician, has seen cases of bleeding complications with Eliquis, which has heightened her concerns about its use.  She would like to explore the option of taking aspirin 162 mg daily as prophylaxis instead of Eliquis.  MEDICAL HISTORY:  Past Medical History:  Diagnosis  Date   Anemia    many years ago   Anxiety    situational   Arthritis    "hands" (07/12/2012)   Asthma    seasonal    Chest pain    Chest pain at rest, on going 07/12/2012   Dercum's disease    DVT (deep venous thrombosis) (HCC)    Eczema    Family history of early CAD 07/12/2012   Fibromyalgia    GERD (gastroesophageal reflux disease)    Headache(784.0)    "often; not daily" (07/12/2012)   Hypertension    not on medications   Kidney stones    Migraine    Pneumonia    as a child   PONV (postoperative nausea and vomiting)    she states she gets very sick    SURGICAL HISTORY: Past Surgical History:  Procedure Laterality Date   breast lift     CARDIAC CATHETERIZATION  07/11/2012   CHOLECYSTECTOMY N/A 09/18/2015   Procedure: LAPAROSCOPIC CHOLECYSTECTOMY;  Surgeon: Abigail Miyamoto, MD;  Location: [facility] OR;  Service: General;  Laterality: N/A;   FOOT SURGERY  09/2019   toe surgery   LEFT HEART CATHETERIZATION WITH CORONARY ANGIOGRAM N/A 07/11/2012   Procedure: LEFT HEART CATHETERIZATION WITH CORONARY ANGIOGRAM;  Surgeon: Runell Gess, MD;  Location: [facility] CATH LAB;  Service: Cardiovascular;  Laterality: N/A;   REDUCTION MAMMAPLASTY Bilateral 10+ years ago   TONSILLECTOMY  ~ 1976   tubes and ovaries removed  2015   VAGINAL HYSTERECTOMY  ~ 2009    SOCIAL HISTORY: Social History   Socioeconomic History   Marital status: Married    Spouse name: Not on file   Number of children: Not on file   Years of education: Not on file   Highest education level: Not on file  Occupational History   Not on file  Tobacco Use   Smoking status: Never   Smokeless tobacco: Never  Vaping Use   Vaping status: Never Used  Substance and Sexual Activity   Alcohol use: No   Drug use: No   Sexual activity: Yes  Other Topics Concern   Not on file  Social History Narrative   Not on file   Social Drivers of Health   Financial Resource Strain: Low Risk  (12/13/2022)   Overall Financial  Resource Strain (CARDIA)    Difficulty of Paying Living Expenses: Not very hard  Food Insecurity: No Food Insecurity (12/16/2022)   Hunger Vital Sign    Worried About Running Out of Food in the Last Year: Never true    Ran Out of Food in the Last Year: Never true  Transportation Needs: No Transportation Needs (12/16/2022)   PRAPARE - Administrator, Civil Service (Medical): No    Lack of Transportation (Non-Medical): No  Physical Activity: Not on file  Stress: No Stress Concern Present (12/13/2022)   [employer] of Occupational Health - Occupational Stress Questionnaire    Feeling of Stress : Not at all  Social Connections: Unknown (12/14/2022)   Received from [facility] Short Social Needs Screening - Social Connection    Would you like help with any of the following needs: food, medicine/medical supplies, transportation, loneliness, housing or utilities?: Not on file  Intimate Partner Violence: Not At Risk (12/16/2022)   Humiliation, Afraid, Rape, and Kick questionnaire    Fear of Current or Ex-Partner: No    Emotionally Abused: No    Physically Abused: No    Sexually Abused: No    FAMILY HISTORY: Family History  Problem Relation Age of Onset   Hiatal hernia Mother    Hypertension Mother    Depression Mother    Allergic rhinitis Mother    Heart disease Father    Stroke Maternal Grandfather    Chronic Renal Failure Paternal Grandmother    Drug abuse Son    Allergic rhinitis Brother    Allergic rhinitis Maternal Aunt     ALLERGIES:  is allergic to augmentin [amoxicillin-pot clavulanate], betadine [povidone iodine], contrast media [iodinated contrast media], morphine and codeine, prednisone, yellow jacket venom [bee venom], azithromycin, erythromycin, iohexol, oxycontin [oxycodone], avelox [moxifloxacin hcl in nacl], codeine, gadolinium derivatives, iodine, lamisil [terbinafine], levaquin [levofloxacin in d5w], oxycodone-acetaminophen,  pantoprazole, praluent [alirocumab], pravastatin, rosuvastatin, shellfish allergy, dilaudid [hydromorphone hcl], oxycodone hcl, and tramadol.  MEDICATIONS:  Current Outpatient Medications  Medication Sig Dispense Refill   acetaminophen (TYLENOL) 500 MG tablet Take 500 mg by mouth every 6 (six) hours as needed for mild pain.     Ciclopirox 1 % shampoo USE AS DIRECTED ON SCALP.     clobetasol (TEMOVATE) 0.05 % external solution Apply topically.     diclofenac Sodium (VOLTAREN) 1 % GEL APPLY 2 GRAMS TO THE AFFECTED AREA(S) BY TOPICAL ROUTE 4 TIMES PER DAY     EPINEPHrine (EPIPEN 2-PAK) 0.3  mg/0.3 mL IJ SOAJ injection Inject 0.3 mg into the muscle as needed. 1 each 1   ezetimibe (ZETIA) 10 MG tablet Take 1 tablet (10 mg total) by mouth daily. 90 tablet 3   fexofenadine (ALLEGRA) 180 MG tablet Take 1 tablet (180 mg total) by mouth 2 (two) times daily as needed for allergies or rhinitis. 60 tablet 5   icosapent Ethyl (VASCEPA) 1 g capsule Take 2 capsules (2 g total) by mouth 2 (two) times daily. 360 capsule 3   ipratropium (ATROVENT) 0.03 % nasal spray Place 1-2 sprays into both nostrils 2 (two) times daily as needed (nasal drainage). 30 mL 5   ketoconazole (NIZORAL) 2 % cream Apply 1 Application topically daily. 60 g 2   ketoconazole (NIZORAL) 2 % cream Apply 1 Application topically daily. 60 g 0   levocetirizine (XYZAL) 5 MG tablet Take 1 tablet (5 mg total) by mouth every evening. 30 tablet 5   metroNIDAZOLE (METROGEL) 0.75 % gel metronidazole 0.75 % topical gel  APPLY ON THE SKIN DAILY FOR ROSACEA     olmesartan (BENICAR) 20 MG tablet Take 20 mg by mouth daily.     Olopatadine-Mometasone (RYALTRIS) [id number] MCG/ACT SUSP Place 1-2 sprays into the nose in the morning and at bedtime. 29 g 5   ondansetron (ZOFRAN-ODT) 4 MG disintegrating tablet Take by mouth.     RABEprazole (ACIPHEX) 20 MG tablet Take 20 mg by mouth 2 (two) times daily.     rizatriptan (MAXALT-MLT) 10 MG disintegrating tablet Take 10  mg by mouth every 2 (two) hours as needed for migraine.   11   rosuvastatin (CRESTOR) 5 MG tablet Take 1 tablet (5 mg total) by mouth daily. 90 tablet 3   scopolamine (TRANSDERM-SCOP) 1 MG/3DAYS Place 1 patch (1.5 mg total) onto the skin every 3 (three) days. 10 patch 1   Vitamin D, Ergocalciferol, (DRISDOL) 50000 units CAPS capsule Take 50,000 Units by mouth every Wednesday.      XIFAXAN 550 MG TABS tablet Take 550 mg by mouth 3 (three) times daily.     albuterol (VENTOLIN HFA) 108 (90 Base) MCG/ACT inhaler Inhale 2 puffs into the lungs every 4 (four) hours as needed. (Patient not taking: Reported on 10/03/2023)     No current facility-administered medications for this visit.    Review of Systems  Constitutional:  Negative for appetite change, chills, fatigue and fever.  HENT:   Negative for hearing loss and voice change.   Eyes:  Negative for eye problems.  Respiratory:  Negative for chest tightness, cough and shortness of breath.   Cardiovascular:  Negative for chest pain and leg swelling.  Gastrointestinal:  Negative for abdominal distention, abdominal pain and blood in stool.  Endocrine: Negative for hot flashes.  Genitourinary:  Negative for difficulty urinating and frequency.   Musculoskeletal:  Negative for arthralgias.  Skin:  Negative for itching and rash.  Neurological:  Negative for extremity weakness.  Hematological:  Negative for adenopathy.  Psychiatric/Behavioral:  Negative for confusion.    PHYSICAL EXAMINATION: ECOG PERFORMANCE STATUS: 1 - Symptomatic but completely ambulatory Vitals:   10/03/23 1401 10/03/23 1414  BP: (!) 130/92 (!) 121/91  Pulse: 80   Resp: 18   Temp: (!) 96.8 F (36 C)   SpO2: 100%    Filed Weights   10/03/23 1401  Weight: 238 lb 12.8 oz (108.3 kg)    Physical Exam Constitutional:      General: She is not in acute distress.    Appearance:  She is obese.  HENT:     Head: Normocephalic and atraumatic.  Eyes:     General: No scleral  icterus. Cardiovascular:     Rate and Rhythm: Normal rate.  Pulmonary:     Effort: Pulmonary effort is normal. No respiratory distress.  Abdominal:     General: There is no distension.  Musculoskeletal:        General: No deformity.     Cervical back: Normal range of motion.  Skin:    Findings: No erythema.  Neurological:     Mental Status: She is alert and oriented to person, place, and time. Mental status is at baseline.  Psychiatric:     Comments: Anxious     LABORATORY DATA:  I have reviewed the data as listed    Latest Ref Rng & Units 10/03/2023    1:44 PM 09/14/2023    1:00 AM 03/18/2023   10:01 AM  CBC  WBC 4.0 - 10.5 K/uL 7.1  9.9  6.7   Hemoglobin 12.0 - 15.0 g/dL 11.9  14.7  82.9   Hematocrit 36.0 - 46.0 % 42.0  41.6  40.8   Platelets 150 - 400 K/uL 289  307  288       Latest Ref Rng & Units 09/14/2023    1:00 AM 03/18/2023   10:01 AM 12/18/2022    5:12 PM  CMP  Glucose 70 - 99 mg/dL 562  96  130   BUN 6 - 20 mg/dL 16  16  8    Creatinine 0.44 - 1.00 mg/dL 8.65  7.84  6.96   Sodium 135 - 145 mmol/L 139  140  140   Potassium 3.5 - 5.1 mmol/L 3.9  4.4  3.3   Chloride 98 - 111 mmol/L 104  106  102   CO2 22 - 32 mmol/L 24  25  27    Calcium 8.9 - 10.3 mg/dL 8.8  8.8  8.8   Total Protein 6.5 - 8.1 g/dL  6.7  6.8   Total Bilirubin 0.3 - 1.2 mg/dL  0.4  0.4   Alkaline Phos 38 - 126 U/L  62  61   AST 15 - 41 U/L  16  28   ALT 0 - 44 U/L  21  37       RADIOGRAPHIC STUDIES: I have personally reviewed the radiological images as listed and agreed with the findings in the report. CT Renal Stone Study Result Date: 09/14/2023 CLINICAL DATA:  Abdominal/flank pain, stone suspected EXAM: CT ABDOMEN AND PELVIS WITHOUT CONTRAST TECHNIQUE: Multidetector CT imaging of the abdomen and pelvis was performed following the standard protocol without IV contrast. RADIATION DOSE REDUCTION: This exam was performed according to the departmental dose-optimization program which includes  automated exposure control, adjustment of the mA and/or kV according to patient size and/or use of iterative reconstruction technique. COMPARISON:  CT 12/05/2020 FINDINGS: Lower chest: No acute abnormality. Hepatobiliary: Unremarkable liver. Cholecystectomy. No biliary dilation. Pancreas: Unremarkable. Spleen: Unremarkable. Adrenals/Urinary Tract: Normal adrenal glands. No urinary calculi or hydronephrosis. Bladder is unremarkable. Stomach/Bowel: Normal caliber large and small bowel. No bowel wall thickening. The appendix is normal.Stomach is within normal limits. Vascular/Lymphatic: No significant vascular findings are present. No enlarged abdominal or pelvic lymph nodes. Reproductive: Unremarkable. Other: No free intraperitoneal fluid or air. Musculoskeletal: No acute fracture. IMPRESSION: No acute abnormality in the abdomen or pelvis. Electronically Signed   By: Minerva Fester M.D.   On: 09/14/2023 02:58   DG Chest [facility]  Result Date: 09/14/2023 CLINICAL DATA:  Central chest pain radiating to the right back between the shoulder blades. Shortness of breath. EXAM: PORTABLE CHEST 1 VIEW COMPARISON:  12/18/2022 FINDINGS: Stable cardiomediastinal silhouette. Low lung volumes accentuate pulmonary vascularity. No focal consolidation, pleural effusion, or pneumothorax. No displaced rib fractures. IMPRESSION: No active disease. Electronically Signed   By: Minerva Fester M.D.   On: 09/14/2023 01:04

## 2023-10-04 ENCOUNTER — Ambulatory Visit: Payer: PRIVATE HEALTH INSURANCE

## 2023-10-04 ENCOUNTER — Telehealth: Payer: Self-pay

## 2023-10-04 NOTE — Telephone Encounter (Signed)
 Patient called in stating she cancelled today's venom injection appointment. She has been experiencing swelling in her hands with pain for the past 2 weeks. Last week she started having joint pain around the elbow/shoulder area. Her hands/feet have been red. When bending her hand she feels as it's asleep/numb. Patient mentioned she has seen hematology, pcp, rheumatology and cannot get to the bottom of it. Patient wants to know what to do moving forward if she is to cancel next week's appt as well as she is having these symptoms. Please advise.

## 2023-10-04 NOTE — Telephone Encounter (Signed)
 Please call patient.  If she thinks the venom injections make her symptoms worse then we can hold the shots for now until she figures out what's going on with her joints.

## 2023-10-04 NOTE — Telephone Encounter (Signed)
 I sent the patient a mychart message and cancelled her next injection. I informed her that she will need to call to make an appointment for her next injection when she is ready to restart them.

## 2023-10-12 ENCOUNTER — Ambulatory Visit: Payer: PRIVATE HEALTH INSURANCE

## 2023-10-17 NOTE — Telephone Encounter (Signed)
 Okay to go back on the injections and monitor her symptoms.

## 2023-10-17 NOTE — Telephone Encounter (Signed)
 Called and left a voicemail asking for patient to return call to discuss.

## 2023-10-17 NOTE — Telephone Encounter (Signed)
 Patient's called and states that she is still having all of the same symptoms in the previous message by Byrd Hesselbach despite being off of venom injections for a few weeks. She states her PCP and Rheumatologist said she did not have to stay off of the venom shots. Is it okay for her to resume?

## 2023-10-18 ENCOUNTER — Ambulatory Visit: Payer: PRIVATE HEALTH INSURANCE

## 2023-10-18 ENCOUNTER — Ambulatory Visit (INDEPENDENT_AMBULATORY_CARE_PROVIDER_SITE_OTHER): Payer: PRIVATE HEALTH INSURANCE | Admitting: *Deleted

## 2023-10-18 DIAGNOSIS — Z91038 Other insect allergy status: Secondary | ICD-10-CM | POA: Diagnosis not present

## 2023-10-18 DIAGNOSIS — T63441D Toxic effect of venom of bees, accidental (unintentional), subsequent encounter: Secondary | ICD-10-CM

## 2023-10-18 NOTE — Telephone Encounter (Signed)
 Patient called back and has been scheduled to come in today and get venom injection.

## 2023-10-19 NOTE — Telephone Encounter (Signed)
 Noted.

## 2023-10-19 NOTE — Telephone Encounter (Signed)
 Patient did come in yesterday and receive her venom injections. She called back today and stated that her hands are red like sunburn and her whole body is inflamed. She states that she is going to hold off for now on her venom injections until she can get things figured out.

## 2023-10-24 ENCOUNTER — Ambulatory Visit: Payer: PRIVATE HEALTH INSURANCE | Admitting: Podiatry

## 2023-10-25 ENCOUNTER — Ambulatory Visit: Payer: PRIVATE HEALTH INSURANCE

## 2023-10-26 ENCOUNTER — Other Ambulatory Visit: Payer: Self-pay | Admitting: Allergy

## 2023-11-01 ENCOUNTER — Ambulatory Visit: Payer: PRIVATE HEALTH INSURANCE

## 2023-11-08 ENCOUNTER — Ambulatory Visit: Payer: PRIVATE HEALTH INSURANCE | Admitting: Podiatry

## 2023-11-08 ENCOUNTER — Encounter: Payer: Self-pay | Admitting: Podiatry

## 2023-11-08 DIAGNOSIS — L600 Ingrowing nail: Secondary | ICD-10-CM

## 2023-11-08 DIAGNOSIS — M7661 Achilles tendinitis, right leg: Secondary | ICD-10-CM | POA: Diagnosis not present

## 2023-11-08 DIAGNOSIS — M7662 Achilles tendinitis, left leg: Secondary | ICD-10-CM

## 2023-11-10 ENCOUNTER — Ambulatory Visit (HOSPITAL_BASED_OUTPATIENT_CLINIC_OR_DEPARTMENT_OTHER): Payer: PRIVATE HEALTH INSURANCE | Admitting: Cardiology

## 2023-11-10 VITALS — BP 146/84 | HR 90 | Ht 65.0 in | Wt 244.1 lb

## 2023-11-10 DIAGNOSIS — E782 Mixed hyperlipidemia: Secondary | ICD-10-CM | POA: Diagnosis not present

## 2023-11-10 DIAGNOSIS — E882 Lipomatosis, not elsewhere classified: Secondary | ICD-10-CM | POA: Diagnosis not present

## 2023-11-10 MED ORDER — EZETIMIBE 10 MG PO TABS: 10.0000 mg | ORAL_TABLET | Freq: Every day | ORAL | 3 refills | Status: AC

## 2023-11-10 MED ORDER — ROSUVASTATIN CALCIUM 5 MG PO TABS: 5.0000 mg | ORAL_TABLET | Freq: Every day | ORAL | 3 refills | Status: AC

## 2023-11-10 MED ORDER — ICOSAPENT ETHYL 1 G PO CAPS: 2.0000 g | ORAL_CAPSULE | Freq: Two times a day (BID) | ORAL | 3 refills | Status: AC

## 2023-11-10 NOTE — Patient Instructions (Signed)
 Medication Instructions:  Your physician recommends that you continue on your current medications as directed. Please refer to the Current Medication list given to you today.   Labwork: NONE  Testing/Procedures: NONE  Follow-Up:  MOVING FORWARD YOU CAN ASK DR SHAW TO REFILL YOUR MEDICATIONS AND SEE DR Anne Fu AS NEEDED

## 2023-11-10 NOTE — Progress Notes (Signed)
 " Cardiology Office Note:  .   Date:  11/10/2023  ID:  [name] [name], DOB Nov 30, 1965, MRN [id number] PCP: Loreli Elsie [name], MD  [facility] Providers Cardiologist:  Oneil Parchment, MD    History of Present Illness: .   [name] [name] is a 58 y.o. female Discussed the use of AI scribe software for clinical note transcription with the patient, who gave verbal consent to proceed.  History of Present Illness [name] [name] is a 58 year old female who presents for management of hyperlipidemia.  She has a history of statin intolerances and is currently managing hyperlipidemia with rosuvastatin  5 mg daily, Zetia  10 mg daily, and Vascepa  2 grams twice daily. She previously experienced intolerance to pravastatin  20 mg daily, rosuvastatin  10 mg daily, and Praluent . Her family history includes premature coronary artery disease and elevated lipoprotein(a), with an LDL goal of less than 70 mg/dL. Her most recent lipid panel in July 2023 showed an LDL of 41 mg/dL, HDL of 81 mg/dL, and triglycerides of 852 mg/dL. A calcium  score of zero and a CT scan showed no coronary disease.  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 The lipomas are located on her joints, causing numbness in her hands, and are also present on her face. The condition has been very painful and has impacted her ability to work and exercise. She has gained weight, which she attributes to the disease and the use of steroids last summer.  She reports a history of inflammation, with symptoms including red, painful hands and fluid retention. She manages these symptoms with her current medication regimen, which includes anti-inflammatory properties. She has not had recent testing for inflammatory markers but is concerned about the potential cardiac implications of her inflammation.  Her family history is significant for her father  having an abdominal aortic aneurysm and carotid artery disease, leading to his death at age 68.      Studies Reviewed: .        Results LABS LDL: 41 (01/2022) HDL: 81 (01/2022) Triglycerides: 147 (01/2022)  RADIOLOGY Calcium  score: 0 CT scan: No coronary artery disease Risk Assessment/Calculations:           Physical Exam:   VS:  BP (!) 146/84   Pulse 90   Ht 5' 5 (1.651 m)   Wt 244 lb 1.6 oz (110.7 kg)   SpO2 98%   BMI 40.62 kg/m    Wt Readings from Last 3 Encounters:  11/10/23 244 lb 1.6 oz (110.7 kg)  10/03/23 238 lb 12.8 oz (108.3 kg)  09/20/23 244 lb 8 oz (110.9 kg)    GEN: Well nourished, well developed in no acute distress NECK: No JVD; No carotid bruits CARDIAC: RRR, no murmurs, no rubs, no gallops RESPIRATORY:  Clear to auscultation without rales, wheezing or rhonchi  ABDOMEN: Soft, non-tender, non-distended EXTREMITIES:  No edema; No deformity   ASSESSMENT AND PLAN: .    Assessment and Plan Assessment & Plan Hyperlipidemia   She has lipid intolerance to several medications, including pravastatin , rosuvastatin , and Praluent . Currently, she is on rosuvastatin  5 mg daily, Zetia  10 mg daily, and Vascepa  2 grams BID. Her LDL goal is less than 70 due to a family history of premature coronary artery disease and elevated Lp(a). Recent lipid panel showed LDL of 41, HDL of 81, and triglycerides of 147. The calcium  score was zero, and a CT scan showed no coronary disease. The current  regimen is effective, and there is no need for additional injections as her cholesterol levels are well-controlled.   - Continue rosuvastatin  5 mg daily.   - Continue Zetia  10 mg daily.   - Continue Vascepa  2 grams BID.   - Coordinate with Dr. Loreli to manage all medications under one provider.    Inflammation Concerns   She reports significant inflammation, particularly in her hands, with redness and swelling. She is concerned about the potential cardiac implications. Previous tests  for inflammatory markers have not been recent, but she is currently on medications that help manage inflammation. The current regimen is appropriate for managing cardiac inflammation risks. Vascepa  and rosuvastatin  are effective in relieving the inflammatory matrix in the heart, helping to prevent myocardial infarction.   - Continue current regimen with Vascepa  and rosuvastatin  for anti-inflammatory effects.    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 She has experienced significant weight gain and inflammation, which she attributes to the disease. The condition is rare, and she is seeking a engineer, petroleum for potential removal of some lipomas. The disease has also contributed to her concerns about inflammation and its impact on her cardiovascular health.   - Consult with a plastic surgeon for potential lipoma removal.    Family History of Aneurysm   She has a family history of aneurysms, including her father who had an abdominal aneurysm and carotid artery issues. The recent heart scan showed no aneurysms, and the aorta was visualized as normal. The family history is noted, but no immediate action is required given the current findings.   - Monitor for any symptoms or changes that may suggest aneurysm development.    Weight Management   She is experiencing weight gain, which she attributes to Dercum's disease and is considering weight management options. She has been advised about the potential benefits of medications like Wegovy, which have shown cardiovascular benefits. However, she is hesitant due to potential side effects and is currently managing her weight through diet and exercise.  - Consider discussing weight management medications like Wegovy with Dr. Loreli if interested.   - Continue current exercise and dietary regimen.    Follow-up   She is advised to coordinate her medication management with  Dr. Loreli to streamline her care. She is encouraged to maintain her current regimen and reach out if any issues arise.   - Coordinate with Dr. Loreli for medication management.   - Reach out to the clinic if any new symptoms or concerns arise.           Signed, Oneil Parchment, MD   "

## 2023-11-11 ENCOUNTER — Telehealth: Payer: Self-pay | Admitting: Pharmacy Technician

## 2023-11-11 ENCOUNTER — Other Ambulatory Visit (HOSPITAL_COMMUNITY): Payer: Self-pay

## 2023-11-11 NOTE — Telephone Encounter (Signed)
 Pharmacy Patient Advocate Encounter  Received notification from  [employer]ocate health  that Prior Authorization for Icosapent Ethyl 1GM capsules has been APPROVED from 11/11/23 to 11/10/24. Spoke to pharmacy to process.Copay is $25.00.

## 2023-11-11 NOTE — Progress Notes (Signed)
 Subjective: Chief Complaint  Patient presents with   Ingrown Toenail    RM#11 Bilateral ingrown      58 year old female presents the office today for concerns of her nail started to get ingrown, dystrophy of the toenail.  They are growing well.  No swelling or redness or any drainage.  No other concerns she will toes.    She is follows with orthopedics for her left Achilles tendon but has questions in regards to this today.  She previously had shockwave treatment which was not helpful.  She was prescribed nitro patches but she has been nervous to use them.    She is on Eliquis   Objective: AAO x3, NAD DP/PT pulses palpable bilaterally, CRT less than 3 seconds Bilateral hallux nails are mild dystrophic and mildly ingrown but there is no edema, erythema or signs of infection.  The nails due to.  Growing out incomplete more clear in color.  Focal nodule present along the left Achilles tendon.  Achilles tendon appears to be intact.  Slight edema over there is no erythema or warmth.  No area pinpoint tenderness. No pain with calf compression, swelling, warmth, erythema  Assessment: 58 year old female with ingrown toenails, without signs of infection; chronic anticoagulation  Plan: Ingrown toenails -There is a debrided the and the toenails x 2 any complications or bleeding.  The ingrown superiorly doing much better and is no signs of infection.   Achilles tendinitis left side -She is also followed with orthopedics for this.  We previously tried shockwave.  We discussed side effects the nitro patches and she is going to try this.  Continue stretching, icing a regular basis.  She is, there are support.  She has a follow-up with with ortho this week.   Return in about 3 months (around 02/07/2024).  Vivi Barrack DPM

## 2023-11-22 ENCOUNTER — Telehealth: Payer: Self-pay | Admitting: Neurology

## 2023-11-22 NOTE — Telephone Encounter (Signed)
 Pt asking for a call to discuss still feeling tired even while using her CPAP

## 2023-11-23 NOTE — Telephone Encounter (Signed)
 Called the pt back. Advised that based off the 30 day download I don't feel like its enough data to determine why she would still be sleepy since she is not considered compliant.  Pt states that she ends up falling asleep before she puts it on.  After 25 mins of talking about her other health concerns and problems with her sleep. Pt was encouraged to follow up with ENT who she had a evaluation with in feb. They discussed ordering a updated MRI to assess her concerns with nasal airway.  She will continue to follow up with them, start putting her mask on as soon as she gets in bed to read so that hopefully she will fall asleep with it on and wear for longer. Advised that we need to at least have 2 weeks of consistent compliance before we can see why she would be feeling tired despite using the machine. Pt verbalized understanding.

## 2023-11-24 ENCOUNTER — Ambulatory Visit: Payer: PRIVATE HEALTH INSURANCE | Admitting: Podiatry

## 2023-12-08 ENCOUNTER — Ambulatory Visit: Payer: PRIVATE HEALTH INSURANCE | Admitting: Adult Health

## 2023-12-19 ENCOUNTER — Ambulatory Visit: Payer: PRIVATE HEALTH INSURANCE | Admitting: Podiatry

## 2023-12-19 ENCOUNTER — Encounter: Payer: Self-pay | Admitting: Podiatry

## 2023-12-19 DIAGNOSIS — B351 Tinea unguium: Secondary | ICD-10-CM | POA: Diagnosis not present

## 2023-12-19 DIAGNOSIS — M79675 Pain in left toe(s): Secondary | ICD-10-CM | POA: Diagnosis not present

## 2023-12-19 DIAGNOSIS — M79674 Pain in right toe(s): Secondary | ICD-10-CM | POA: Diagnosis not present

## 2023-12-19 DIAGNOSIS — L600 Ingrowing nail: Secondary | ICD-10-CM

## 2023-12-19 DIAGNOSIS — L603 Nail dystrophy: Secondary | ICD-10-CM

## 2023-12-19 MED ORDER — KETOCONAZOLE 2 % EX CREA
1.0000 | TOPICAL_CREAM | Freq: Every day | CUTANEOUS | 0 refills | Status: DC
Start: 2023-12-19 — End: 2024-02-23

## 2023-12-19 NOTE — Progress Notes (Signed)
 Subjective: Chief Complaint  Patient presents with   Ingrown Toenail    RM 11 Pt is here to follow-up on ingrown toe nails.      58 year old female presents the office today for concerns of her nail started to get ingrown, dystrophy of the toenail and has been checked.  No significant pain at this time but she gets ongoing pain to the left big toe which been chronic on the nail since she has had procedures done in the past.  Denies any swelling, redness or any drainage.    Objective: AAO x3, NAD DP/PT pulses palpable bilaterally, CRT less than 3 seconds Bilateral hallux nails are mild dystrophic and mildly ingrown but there is no edema, erythema or signs of infection.  Overall to that the nails are growing out.  There is no signs of infection today.  She does get ongoing tenderness over the toenails. No pain with calf compression, swelling, warmth, erythema  Assessment: 58 year old female with ingrown toenails, onychodystrophy  Plan: Ingrown toenails/onychodystrophy -There is a debrided the and the toenails x 10 any complications or bleeding.  They appear to growing out.  Continue to monitor.   Charity Conch DPM

## 2023-12-27 ENCOUNTER — Telehealth: Payer: Self-pay | Admitting: Oncology

## 2023-12-27 NOTE — Telephone Encounter (Signed)
 Called pt and left VM with recommendation to go to ER or urgent care for further eval.

## 2023-12-27 NOTE — Telephone Encounter (Signed)
 Patient left a voicemail that she is traveling and having terrible pain in her leg and thigh where she was having pain before. She is asking what she should do. Patients last visit was 3/25 with wrap up as discharge.   Please advise on scheduling

## 2024-01-02 ENCOUNTER — Other Ambulatory Visit: Payer: Self-pay | Admitting: Allergy

## 2024-01-18 ENCOUNTER — Ambulatory Visit: Payer: PRIVATE HEALTH INSURANCE | Admitting: Cardiology

## 2024-01-19 ENCOUNTER — Telehealth: Payer: Self-pay

## 2024-01-19 NOTE — Telephone Encounter (Signed)
 Noted

## 2024-01-19 NOTE — Telephone Encounter (Signed)
 Patient called and stated that due to her health and she has decided to stop venom immunotherapy. She was informed by her rheumatologist that there may be a preservatives in the injection that could be causing some of her health issues. Patient is coming in for an appointment on 03/19/2024 and will sign discontinuing forms.

## 2024-01-30 ENCOUNTER — Ambulatory Visit (INDEPENDENT_AMBULATORY_CARE_PROVIDER_SITE_OTHER): Payer: PRIVATE HEALTH INSURANCE | Admitting: Podiatry

## 2024-01-30 DIAGNOSIS — B351 Tinea unguium: Secondary | ICD-10-CM

## 2024-01-30 DIAGNOSIS — L603 Nail dystrophy: Secondary | ICD-10-CM

## 2024-01-30 DIAGNOSIS — L600 Ingrowing nail: Secondary | ICD-10-CM | POA: Diagnosis not present

## 2024-01-30 NOTE — Patient Instructions (Signed)
 Look at using UREA 40% on the dry skin

## 2024-02-01 NOTE — Progress Notes (Signed)
 Subjective: Chief Complaint  Patient presents with   RFC    RM#62 RFC    58 year old female presents the office today for concerns of her nail started to get ingrown, dystrophy of the toenail and has been checked.  She does not report any significant pain to her toenails.  She like for me to evaluate the nails to see if they are getting any better.  Objective: AAO x3, NAD DP/PT pulses palpable bilaterally, CRT less than 3 seconds Bilateral hallux nails are mild dystrophic and mildly ingrown but there is no edema, erythema or signs of infection.  They do appear to be growing out and more clear on the proximal nail fold they are attached to the underlying nailbed.  There is no tenderness the nails today.  No edema, erythema.  No pain with calf compression, swelling, warmth, erythema  Assessment: 58 year old female with ingrown toenails, onychodystrophy  Plan: Ingrown toenails/onychodystrophy -There is a debrided the and the toenails x 10 any complications or bleeding.  They appear to growing out.  Discussed different topical medications to help.  Biotin supplements.   [name] [name] DPM

## 2024-02-23 ENCOUNTER — Encounter (HOSPITAL_COMMUNITY): Payer: Self-pay | Admitting: *Deleted

## 2024-02-23 ENCOUNTER — Other Ambulatory Visit: Payer: Self-pay

## 2024-02-23 ENCOUNTER — Emergency Department (HOSPITAL_COMMUNITY): Payer: PRIVATE HEALTH INSURANCE

## 2024-02-23 ENCOUNTER — Emergency Department (HOSPITAL_COMMUNITY)
Admission: EM | Admit: 2024-02-23 | Discharge: 2024-02-23 | Disposition: A | Discharge status: Home / Self Care | Payer: PRIVATE HEALTH INSURANCE | Source: Ambulatory Visit | Attending: Emergency Medicine | Admitting: Emergency Medicine

## 2024-02-23 DIAGNOSIS — Z86718 Personal history of other venous thrombosis and embolism: Secondary | ICD-10-CM | POA: Diagnosis not present

## 2024-02-23 DIAGNOSIS — M79661 Pain in right lower leg: Secondary | ICD-10-CM | POA: Diagnosis not present

## 2024-02-23 DIAGNOSIS — E882 Lipomatosis, not elsewhere classified: Secondary | ICD-10-CM | POA: Diagnosis not present

## 2024-02-23 DIAGNOSIS — M7989 Other specified soft tissue disorders: Secondary | ICD-10-CM | POA: Diagnosis not present

## 2024-02-23 DIAGNOSIS — R079 Chest pain, unspecified: Secondary | ICD-10-CM | POA: Insufficient documentation

## 2024-02-23 DIAGNOSIS — M79605 Pain in left leg: Secondary | ICD-10-CM

## 2024-02-23 DIAGNOSIS — M546 Pain in thoracic spine: Secondary | ICD-10-CM | POA: Diagnosis not present

## 2024-02-23 DIAGNOSIS — K429 Umbilical hernia without obstruction or gangrene: Secondary | ICD-10-CM | POA: Insufficient documentation

## 2024-02-23 DIAGNOSIS — M79662 Pain in left lower leg: Secondary | ICD-10-CM | POA: Diagnosis not present

## 2024-02-23 LAB — COMPREHENSIVE METABOLIC PANEL WITH GFR
ALT: 22 U/L (ref 0–44)
AST: 21 U/L (ref 15–41)
Albumin: 4 g/dL (ref 3.5–5.0)
Alkaline Phosphatase: 63 U/L (ref 38–126)
Anion gap: 12 (ref 5–15)
BUN: 13 mg/dL (ref 6–20)
CO2: 21 mmol/L — ABNORMAL LOW (ref 22–32)
Calcium: 9 mg/dL (ref 8.9–10.3)
Chloride: 109 mmol/L (ref 98–111)
Creatinine, Ser: 0.8 mg/dL (ref 0.44–1.00)
GFR, Estimated: >60 mL/min (ref >60)
Glucose, Bld: 108 mg/dL — ABNORMAL HIGH (ref 70–99)
Potassium: 3.9 mmol/L (ref 3.5–5.1)
Sodium: 142 mmol/L (ref 135–145)
Total Bilirubin: 0.4 mg/dL (ref 0.0–1.2)
Total Protein: 6.8 g/dL (ref 6.5–8.1)

## 2024-02-23 LAB — URINALYSIS, ROUTINE W REFLEX MICROSCOPIC
Bilirubin Urine: NEGATIVE
Glucose, UA: NEGATIVE mg/dL
Hgb urine dipstick: NEGATIVE
Ketones, ur: NEGATIVE mg/dL
Leukocytes,Ua: NEGATIVE
Nitrite: NEGATIVE
Protein, ur: NEGATIVE mg/dL
Specific Gravity, Urine: 1.026 (ref 1.005–1.030)
pH: 5 (ref 5.0–8.0)

## 2024-02-23 LAB — CBC WITH DIFFERENTIAL/PLATELET
Abs Immature Granulocytes: 0.05 K/uL (ref 0.00–0.07)
Basophils Absolute: 0 K/uL (ref 0.0–0.1)
Basophils Relative: 0 %
Eosinophils Absolute: 0.1 K/uL (ref 0.0–0.5)
Eosinophils Relative: 1 %
HCT: 43.5 % (ref 36.0–46.0)
Hemoglobin: 13.7 g/dL (ref 12.0–15.0)
Immature Granulocytes: 1 %
Lymphocytes Relative: 33 %
Lymphs Abs: 2.2 K/uL (ref 0.7–4.0)
MCH: 30 pg (ref 26.0–34.0)
MCHC: 31.5 g/dL (ref 30.0–36.0)
MCV: 95.4 fL (ref 80.0–100.0)
Monocytes Absolute: 0.4 K/uL (ref 0.1–1.0)
Monocytes Relative: 6 %
Neutro Abs: 3.9 K/uL (ref 1.7–7.7)
Neutrophils Relative %: 59 %
Platelets: 255 K/uL (ref 150–400)
RBC: 4.56 MIL/uL (ref 3.87–5.11)
RDW: 13.2 % (ref 11.5–15.5)
WBC: 6.7 K/uL (ref 4.0–10.5)
nRBC: 0 % (ref 0.0–0.2)

## 2024-02-23 LAB — TROPONIN I (HIGH SENSITIVITY)
Troponin I (High Sensitivity): <2 ng/L (ref <18)
Troponin I (High Sensitivity): <2 ng/L (ref <18)

## 2024-02-23 LAB — D-DIMER, QUANTITATIVE: D-Dimer, Quant: 0.28 ug{FEU}/mL (ref 0.00–0.50)

## 2024-02-23 MED ORDER — FAMOTIDINE IN NACL 20-0.9 MG/50ML-% IV SOLN
20.0000 mg | Freq: Once | INTRAVENOUS | Status: AC
  Administered 2024-02-23: 20 mg via INTRAVENOUS
  Filled 2024-02-23: qty 50

## 2024-02-23 MED ORDER — ONDANSETRON HCL 4 MG/2ML IJ SOLN
4.0000 mg | Freq: Once | INTRAMUSCULAR | Status: AC
  Administered 2024-02-23: 4 mg via INTRAVENOUS
  Filled 2024-02-23: qty 2

## 2024-02-23 MED ORDER — KETOROLAC TROMETHAMINE 30 MG/ML IJ SOLN
30.0000 mg | Freq: Once | INTRAMUSCULAR | Status: AC
  Administered 2024-02-23: 30 mg via INTRAVENOUS
  Filled 2024-02-23: qty 1

## 2024-02-23 MED ORDER — KETOROLAC TROMETHAMINE 10 MG PO TABS: 10.0000 mg | ORAL_TABLET | Freq: Four times a day (QID) | ORAL | 0 refills | Status: AC | PRN

## 2024-02-23 NOTE — ED Provider Notes (Signed)
  Physical Exam  BP 129/80   Pulse 71   Temp 98.2 F (36.8 C) (Oral)   Resp 19   Ht 5' 5 (1.651 m)   Wt 99.8 kg   SpO2 97%   BMI 36.61 kg/m   Physical Exam  Procedures  Procedures  ED Course / MDM    Medical Decision Making Care assumed at 4 pm.  Patient is here with back pain and scapula pain and leg pain.  Signed out pending DVT study and CT chest abdomen pelvis.  5:13 PM CT chest abdomen pelvis unremarkable.  DVT study is negative.  Patient is feeling better after Toradol .  Requests prescription of Toradol .  Troponin negative x 2.  At this point patient is stable for discharge  Problems Addressed: Acute thoracic back pain, unspecified back pain laterality: acute illness or injury Pain in both lower extremities: acute illness or injury  Amount and/or Complexity of Data Reviewed Labs: ordered. Decision-making details documented in ED Course. Radiology: ordered and independent interpretation performed. Decision-making details documented in ED Course.  Risk Prescription drug management.         Patt Alm Macho, MD 02/23/24 [phone]

## 2024-02-23 NOTE — ED Notes (Addendum)
 Pt reports heaviness in chest, right sided pain radiating up to shoulder, feels like beginning of anaphlaxis  EDP noltified

## 2024-02-23 NOTE — Progress Notes (Signed)
 BLE venous duplex has been completed.  Preliminary results given to Dr. Armenta.   Results can be found under chart review under CV PROC. 02/23/2024 5:01 PM Alexsis Branscom RVT, RDMS

## 2024-02-23 NOTE — Discharge Instructions (Signed)
 As we discussed, your workup in the ER is very reassuring.  In particular your D-dimer is normal and your troponins are negative.   Your ultrasound did not show any obvI have prescribed Toradol  10 mg every 6 hours as needed  Please continue your current meds  See your doctor   Return to ER if you have worse back pain or leg pain or chest pain or any other worsening symptoms

## 2024-02-23 NOTE — ED Triage Notes (Signed)
 Here by POV from home for pain b/w shoulder blades, constant and worse with inspiration. H/o clots, not currently anticoagulated. Also recent travel. Verbalizes concern for PE.   Also mentions recent sx of dizziness, CP, lips blue, L hand tingling, and R flank/ RUQ pain for 3.5 weeks. Rates pain 7/10.   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    Alert, NAD, calm, interactive, resps e/u, speaking in clear complete sentences. Steady gait.

## 2024-02-23 NOTE — ED Provider Notes (Signed)
  EMERGENCY DEPARTMENT AT [facility] Provider Note   CSN: [id number] Arrival date & time: 02/23/24  1158     Patient presents with: Back Pain   [name] [name] is a 58 y.o. female.   HPI Patient reports about 4 days of sharp stabbing pain behind the shoulder blade and upper flank area on the right.  Patient reports it is worse with a deep breath or certain movements.  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  This was associated with a travel event and after completing anticoagulation therapy was not recommended to remain on chronic anticoagulation.  No known hypercoagulable disorder.  Patient also has a condition of Dercum disease whereby there are painful lipomas.  She is unsure if this could be a manifestation of the Durkan's or possibly concern for PE.  No fevers no chills no productive cough.  Patient reports that she does get pain and swelling in the legs but has not had notable asymmetric swelling recently.  Patient also notes starting treatment with Zepbound about 4 weeks ago.  She did feel that it was giving her abdominal pain and is unsure if that might be a contributing factor as well.    Prior to Admission medications   Medication Sig Start Date End Date Taking? Authorizing Provider  acetaminophen  (TYLENOL ) 500 MG tablet Take 500 mg by mouth every 6 (six) hours as needed for mild pain (pain score 1-3).    [provider]  albuterol  (VENTOLIN  HFA) 108 (90 Base) MCG/ACT inhaler Inhale 2 puffs into the lungs every 4 (four) hours as needed. 09/07/23   [provider]  Ciclopirox  1 % shampoo Apply 1 Application topically as needed. 02/15/23   [provider]  clobetasol (TEMOVATE) 0.05 % external solution Apply topically. 02/15/23   [provider]  diclofenac Sodium (VOLTAREN) 1 % GEL Apply 2 g topically 4 (four) times daily. 02/16/23   [provider]  EPINEPHrine  (EPIPEN  2-PAK) 0.3 mg/0.3 mL IJ SOAJ  injection Inject 0.3 mg into the muscle as needed. 09/20/23   Luke Orlan HERO, DO  ezetimibe  (ZETIA ) 10 MG tablet Take 1 tablet (10 mg total) by mouth daily. 11/10/23   Jeffrie Oneil BROCKS, MD  fexofenadine  (ALLEGRA ) 180 MG tablet Take 1 tablet (180 mg total) by mouth 2 (two) times daily as needed for allergies or rhinitis. 09/20/23   Luke Orlan HERO, DO  icosapent  Ethyl (VASCEPA ) 1 g capsule Take 2 capsules (2 g total) by mouth 2 (two) times daily. 11/10/23   Jeffrie Oneil BROCKS, MD  ipratropium (ATROVENT ) 0.03 % nasal spray Place 1-2 sprays into both nostrils 2 (two) times daily as needed (nasal drainage). 11/25/21   Luke Orlan HERO, DO  ketoconazole  (NIZORAL ) 2 % cream Apply 1 Application topically daily. 11/16/22   Joya Stabs, DPM  ketoconazole  (NIZORAL ) 2 % cream Apply 1 Application topically daily. 02/14/23   Gershon Donnice SAUNDERS, DPM  ketoconazole  (NIZORAL ) 2 % cream Apply 1 Application topically daily. 12/19/23   Gershon Donnice SAUNDERS, DPM  levocetirizine (XYZAL ) 5 MG tablet Take 1 tablet (5 mg total) by mouth every evening. 01/03/24   Luke Orlan HERO, DO  metroNIDAZOLE  (METROGEL ) 0.75 % gel Apply 1 Application topically 2 (two) times daily.    [provider]  olmesartan  (BENICAR ) 20 MG tablet Take 20 mg by mouth daily. 11/03/18   [provider]  Olopatadine -Mometasone  (RYALTRIS ) [id number] MCG/ACT SUSP Place 1-2 sprays into the nose in the morning and at  bedtime. 09/20/23   Luke Orlan HERO, DO  ondansetron  (ZOFRAN -ODT) 4 MG disintegrating tablet Take by mouth. 12/15/22   [provider]  RABEprazole (ACIPHEX) 20 MG tablet Take 20 mg by mouth daily.    [provider]  rizatriptan  (MAXALT -MLT) 10 MG disintegrating tablet Take 10 mg by mouth every 2 (two) hours as needed for migraine.  06/18/15   [provider]  rosuvastatin  (CRESTOR ) 5 MG tablet Take 1 tablet (5 mg total) by mouth daily. 11/10/23   Jeffrie Oneil BROCKS, MD  scopolamine  (TRANSDERM-SCOP) 1 MG/3DAYS Place 1 patch (1.5 mg total) onto  the skin every 3 (three) days. 12/17/22   Barbarann Nest, MD  Vitamin D, Ergocalciferol, (DRISDOL) 50000 units CAPS capsule Take 50,000 Units by mouth every Wednesday.     [provider]  XIFAXAN 550 MG TABS tablet Take 550 mg by mouth 3 (three) times daily.    [provider]    Allergies: Augmentin [amoxicillin-pot clavulanate], Betadine [povidone iodine], Contrast media [iodinated contrast media], Morphine and codeine, Prednisone , Yellow jacket venom [bee venom], Azithromycin, Erythromycin, Iohexol , Oxycontin [oxycodone], Avelox [moxifloxacin hcl in nacl], Codeine, Gadolinium derivatives, Iodine, Lamisil  [terbinafine ], Levaquin [levofloxacin in d5w], Oxycodone-acetaminophen , Pantoprazole , Praluent  [alirocumab ], Pravastatin , Rosuvastatin , Shellfish allergy , Dilaudid [hydromorphone hcl], Oxycodone hcl, and Tramadol    Review of Systems  Updated Vital Signs BP 129/80   Pulse 71   Temp 98.2 F (36.8 C) (Oral)   Resp 19   Ht 5' 5 (1.651 m)   Wt 99.8 kg   SpO2 97%   BMI 36.61 kg/m   Physical Exam Constitutional:      Comments: Alert nontoxic well-nourished well-developed.  No respiratory distress.  HENT:     Mouth/Throat:     Pharynx: Oropharynx is clear.  Eyes:     Extraocular Movements: Extraocular movements intact.  Cardiovascular:     Rate and Rhythm: Normal rate and regular rhythm.     Heart sounds: Normal heart sounds.  Pulmonary:     Effort: Pulmonary effort is normal.     Breath sounds: Normal breath sounds.     Comments: Patient does have some reproducible pain in the subscapularis area on the right as well as toward the lower aspect of the right paraspinous muscle body at about the level of the flank.  No rash or visible soft tissue abnormality.  No palpable soft tissue abnormality. Musculoskeletal:     Comments: Lower extremities grossly symmetric at this time.  Calves are pliable.  Feet are warm and dry.  Neurological:     General: No focal deficit  present.     Mental Status: She is oriented to person, place, and time.     Motor: No weakness.     Coordination: Coordination normal.  Psychiatric:        Mood and Affect: Mood normal.     (all labs ordered are listed, but only abnormal results are displayed) Labs Reviewed  COMPREHENSIVE METABOLIC PANEL WITH GFR - Abnormal; Notable for the following components:      Result Value   CO2 21 (*)    Glucose, Bld 108 (*)    All other components within normal limits  CBC WITH DIFFERENTIAL/PLATELET  D-DIMER, QUANTITATIVE  URINALYSIS, ROUTINE W REFLEX MICROSCOPIC  TROPONIN I (HIGH SENSITIVITY)  TROPONIN I (HIGH SENSITIVITY)    EKG: EKG Interpretation Date/Time:  Thursday February 23 2024 14:54:49 EDT Ventricular Rate:  75 PR Interval:  183 QRS Duration:  85 QT Interval:  396 QTC Calculation: 443 R  Axis:   15  Text Interpretation: Sinus rhythm Low voltage, precordial leads No significant change since last tracing Confirmed by Patt Alm DEL [phone]) on 02/23/2024 4:23:18 PM  Radiology: DG Chest 2 View Result Date: 02/23/2024 CLINICAL DATA:  cp EXAM: CHEST - 2 VIEW COMPARISON:  September 14, 2023 FINDINGS: No focal airspace consolidation, pleural effusion, or pneumothorax. No cardiomegaly. No acute fracture or destructive lesion. Multilevel thoracic osteophytosis. Cholecystectomy clips. IMPRESSION: No acute cardiopulmonary abnormality. Electronically Signed   By: Rogelia Myers M.D.   On: 02/23/2024 14:02     Procedures   Medications Ordered in the ED  ketorolac  (TORADOL ) 30 MG/ML injection 30 mg (30 mg Intravenous Given 02/23/24 1525)  famotidine  (PEPCID ) IVPB 20 mg premix (20 mg Intravenous New Bag/Given 02/23/24 1531)  ondansetron  (ZOFRAN ) injection 4 mg (4 mg Intravenous Given 02/23/24 1549)                                    Medical Decision Making Amount and/or Complexity of Data Reviewed Labs: ordered. Radiology: ordered.  Risk Prescription drug management.   Patient  presents as outlined with a pleuritic sharp quality pain in the right posterior thoracic area.  Differential diagnosis includes PE\musculoskeletal pain\painful lipoma from Dercum disease\kidney stone or pyelonephritis.  Urinalysis negative.  Troponin less than 2.  Comprehensive metabolic panel normal.  D-dimer 0.28.  CBC with differential normal with normal differential DVT study negative.  At this time no positive findings to suggest PE.  Patient is not candidate for contrast study.  Patient has no tachycardia or hypoxia.  DVT study negative and D-dimer not elevated.  Awaiting noncontrast CT results for any other potential abnormality.  Troponins and EKGs are normal at this time.  Clinically patient remained stable.     Final diagnoses:  None    ED Discharge Orders     None          Armenta Canning, MD 02/23/24 [phone]

## 2024-03-15 ENCOUNTER — Emergency Department (HOSPITAL_COMMUNITY)
Admission: EM | Admit: 2024-03-15 | Discharge: 2024-03-15 | Disposition: A | Discharge status: Home / Self Care | Payer: PRIVATE HEALTH INSURANCE | Attending: Emergency Medicine | Admitting: Emergency Medicine

## 2024-03-15 ENCOUNTER — Other Ambulatory Visit: Payer: Self-pay

## 2024-03-15 ENCOUNTER — Emergency Department (HOSPITAL_COMMUNITY): Payer: PRIVATE HEALTH INSURANCE

## 2024-03-15 DIAGNOSIS — E860 Dehydration: Secondary | ICD-10-CM | POA: Insufficient documentation

## 2024-03-15 DIAGNOSIS — Z79899 Other long term (current) drug therapy: Secondary | ICD-10-CM | POA: Insufficient documentation

## 2024-03-15 DIAGNOSIS — R1010 Upper abdominal pain, unspecified: Secondary | ICD-10-CM | POA: Diagnosis not present

## 2024-03-15 DIAGNOSIS — R519 Headache, unspecified: Secondary | ICD-10-CM | POA: Diagnosis not present

## 2024-03-15 DIAGNOSIS — R197 Diarrhea, unspecified: Secondary | ICD-10-CM | POA: Insufficient documentation

## 2024-03-15 DIAGNOSIS — T50905A Adverse effect of unspecified drugs, medicaments and biological substances, initial encounter: Secondary | ICD-10-CM | POA: Insufficient documentation

## 2024-03-15 DIAGNOSIS — R112 Nausea with vomiting, unspecified: Secondary | ICD-10-CM | POA: Diagnosis present

## 2024-03-15 DIAGNOSIS — Z7982 Long term (current) use of aspirin: Secondary | ICD-10-CM | POA: Insufficient documentation

## 2024-03-15 DIAGNOSIS — I10 Essential (primary) hypertension: Secondary | ICD-10-CM | POA: Insufficient documentation

## 2024-03-15 DIAGNOSIS — R079 Chest pain, unspecified: Secondary | ICD-10-CM | POA: Diagnosis not present

## 2024-03-15 DIAGNOSIS — J45909 Unspecified asthma, uncomplicated: Secondary | ICD-10-CM | POA: Insufficient documentation

## 2024-03-15 DIAGNOSIS — T887XXA Unspecified adverse effect of drug or medicament, initial encounter: Secondary | ICD-10-CM | POA: Insufficient documentation

## 2024-03-15 LAB — CBC WITH DIFFERENTIAL/PLATELET
Abs Immature Granulocytes: 0.03 K/uL (ref 0.00–0.07)
Basophils Absolute: 0 K/uL (ref 0.0–0.1)
Basophils Relative: 0 %
Eosinophils Absolute: 0.2 K/uL (ref 0.0–0.5)
Eosinophils Relative: 2 %
HCT: 45.3 % (ref 36.0–46.0)
Hemoglobin: 14.6 g/dL (ref 12.0–15.0)
Immature Granulocytes: 0 %
Lymphocytes Relative: 16 %
Lymphs Abs: 1.6 K/uL (ref 0.7–4.0)
MCH: 30.5 pg (ref 26.0–34.0)
MCHC: 32.2 g/dL (ref 30.0–36.0)
MCV: 94.6 fL (ref 80.0–100.0)
Monocytes Absolute: 0.5 K/uL (ref 0.1–1.0)
Monocytes Relative: 5 %
Neutro Abs: 7.6 K/uL (ref 1.7–7.7)
Neutrophils Relative %: 77 %
Platelets: 286 K/uL (ref 150–400)
RBC: 4.79 MIL/uL (ref 3.87–5.11)
RDW: 13 % (ref 11.5–15.5)
WBC: 9.9 K/uL (ref 4.0–10.5)
nRBC: 0 % (ref 0.0–0.2)

## 2024-03-15 LAB — COMPREHENSIVE METABOLIC PANEL WITH GFR
ALT: 15 U/L (ref 0–44)
AST: 14 U/L — ABNORMAL LOW (ref 15–41)
Albumin: 4.2 g/dL (ref 3.5–5.0)
Alkaline Phosphatase: 74 U/L (ref 38–126)
Anion gap: 11 (ref 5–15)
BUN: 13 mg/dL (ref 6–20)
CO2: 23 mmol/L (ref 22–32)
Calcium: 8.6 mg/dL — ABNORMAL LOW (ref 8.9–10.3)
Chloride: 103 mmol/L (ref 98–111)
Creatinine, Ser: 1.03 mg/dL — ABNORMAL HIGH (ref 0.44–1.00)
GFR, Estimated: >60 mL/min (ref >60)
Glucose, Bld: 120 mg/dL — ABNORMAL HIGH (ref 70–99)
Potassium: 3.4 mmol/L — ABNORMAL LOW (ref 3.5–5.1)
Sodium: 137 mmol/L (ref 135–145)
Total Bilirubin: 1 mg/dL (ref 0.0–1.2)
Total Protein: 7.7 g/dL (ref 6.5–8.1)

## 2024-03-15 LAB — MAGNESIUM: Magnesium: 1.9 mg/dL (ref 1.7–2.4)

## 2024-03-15 LAB — TROPONIN I (HIGH SENSITIVITY): Troponin I (High Sensitivity): <2 ng/L (ref <18)

## 2024-03-15 LAB — HEMOGLOBIN A1C
Hgb A1c MFr Bld: 5.5 % (ref 4.8–5.6)
Mean Plasma Glucose: 111 mg/dL

## 2024-03-15 LAB — LIPASE, BLOOD: Lipase: 29 U/L (ref 11–51)

## 2024-03-15 MED ORDER — KETOROLAC TROMETHAMINE 15 MG/ML IJ SOLN
15.0000 mg | Freq: Once | INTRAMUSCULAR | Status: AC
  Administered 2024-03-15: 15 mg via INTRAVENOUS
  Filled 2024-03-15: qty 1

## 2024-03-15 MED ORDER — SODIUM CHLORIDE 0.9 % IV BOLUS
1000.0000 mL | Freq: Once | INTRAVENOUS | Status: AC
  Administered 2024-03-15: 1000 mL via INTRAVENOUS

## 2024-03-15 MED ORDER — HYDROCODONE-ACETAMINOPHEN 7.5-325 MG/15ML PO SOLN: 15.0000 mL | Freq: Four times a day (QID) | ORAL | 0 refills | Status: AC | PRN

## 2024-03-15 MED ORDER — ONDANSETRON HCL 4 MG/2ML IJ SOLN
4.0000 mg | Freq: Once | INTRAMUSCULAR | Status: AC
  Administered 2024-03-15: 4 mg via INTRAVENOUS
  Filled 2024-03-15: qty 2

## 2024-03-15 NOTE — ED Triage Notes (Signed)
 Patient to ED by POV with c/o headache, emesis and chest pain. Per patient vomiting and diarrhea started Monday, R side chest pain started Tuesday along with R side flank pain that radiates to her back. She states she took OTC and prescribed meds with no relief. She reports dizziness and chills.

## 2024-03-15 NOTE — Discharge Instructions (Addendum)
 You are a bit dehydrated today on your lab work but your white blood cell count and red blood cell count were normal.  Your sodium potassium levels were good as well as your magnesium.  No signs of  any issues with your pancreas or liver.  I would recommend holding Zepbound.  You can also try your maxalt  when you get home but you can try the pain meds too.

## 2024-03-15 NOTE — ED Notes (Signed)
 RN called lab and had Hemoglobin A1c added

## 2024-03-15 NOTE — ED Provider Notes (Addendum)
 [facility] AT [facility] Provider Note   CSN: [id number] Arrival date & time: 03/15/24  1035     Patient presents with: Emesis, Headache, and Chest Pain   [name] [name] is a 58 y.o. female.   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  Patient reports she has been having a pain that wraps around the right side of her chest for about a month now that is worse with palpation, movement and breathing but then since Monday she has felt unwell with headaches, central chest pain, nausea and vomiting as well as diarrhea.  She reports numerous episodes of both but no fever.  She is concerned that her symptoms may be related to the Zepbound.  This is her third dose of the higher dosage which is the second tier.  She took that on Sunday and noticed itching and reaction from the injection site which is slowly getting better but feels that some of her symptoms may be an allergic reaction which she has had multiple and feels a bit similar.  She is not having any significant shortness of breath or throat swelling at this time.  She has also had a headache almost every day since starting the Zepbound.  She is not having any urinary complaints at this time.  Abdominal discomfort is more in the upper abdomen.  She denies any known cardiac disease in herself but reports she does have a heart history in her family.  She had a CT coronary study in 2021 that showed a 0 calcium  score and normal coronary arteries.  The history is provided by the patient and medical records.  Emesis Associated symptoms: headaches   Headache Associated symptoms: vomiting   Chest Pain Associated symptoms: headache and vomiting        Prior to Admission medications   Medication Sig Start Date End Date Taking? Authorizing Provider   HYDROcodone -acetaminophen  (HYCET) 7.5-325 mg/15 ml solution Take 15 mLs by mouth every 6 (six) hours as needed for severe pain (pain score 7-10). 03/15/24 03/15/25 Yes Elesha Thedford, Benton, MD  albuterol  (VENTOLIN  HFA) 108 (90 Base) MCG/ACT inhaler Inhale 2 puffs into the lungs every 4 (four) hours as needed. 09/07/23   [provider]  aspirin  EC 81 MG tablet Take 81 mg by mouth daily. Swallow whole.    [provider]  Ciclopirox  1 % shampoo Apply 1 Application topically as needed. 02/15/23   [provider]  clindamycin (CLEOCIN T) 1 % lotion Apply 1 Application topically 2 (two) times daily.    [provider]  clobetasol (TEMOVATE) 0.05 % external solution Apply 1 Application topically daily. 02/15/23   [provider]  Cyanocobalamin (VITAMIN B-12 IJ) Inject 1 Dose as directed every 30 (thirty) days.    [provider]  diclofenac Sodium (VOLTAREN) 1 % GEL Apply 2 g topically as needed. 02/16/23   [provider]  EPINEPHrine  (EPIPEN  2-PAK) 0.3 mg/0.3 mL IJ SOAJ injection Inject 0.3 mg into the muscle as needed. 09/20/23   Luke Orlan HERO, DO  ezetimibe  (ZETIA ) 10 MG tablet Take 1 tablet (10 mg total) by mouth daily. 11/10/23   Jeffrie Oneil BROCKS, MD  fexofenadine  (ALLEGRA ) 180 MG tablet Take 1 tablet (180 mg total) by mouth 2 (two) times daily as needed for allergies or rhinitis. 09/20/23   Luke Orlan HERO, DO  hydrocortisone 2.5 %  cream Apply 1 Application topically as needed.    [provider]  ibuprofen (ADVIL) 800 MG tablet Take 800 mg by mouth every 8 (eight) hours as needed for mild pain (pain score 1-3) or moderate pain (pain score 4-6).    [provider]  icosapent  Ethyl (VASCEPA ) 1 g capsule Take 2 capsules (2 g total) by mouth 2 (two) times daily. Patient taking differently: Take 2 g by mouth at bedtime. 11/10/23   Jeffrie Oneil BROCKS, MD  ipratropium (ATROVENT ) 0.03 % nasal spray Place 1-2 sprays into both nostrils 2 (two) times  daily as needed (nasal drainage). 11/25/21   Luke Orlan HERO, DO  ketoconazole  (NIZORAL ) 2 % cream Apply 1 Application topically daily. Patient taking differently: Apply 1 Application topically as needed for irritation. 02/14/23   Gershon Donnice SAUNDERS, DPM  ketorolac  (TORADOL ) 10 MG tablet Take 1 tablet (10 mg total) by mouth every 6 (six) hours as needed. 02/23/24   Patt Alm Macho, MD  levocetirizine (XYZAL ) 5 MG tablet Take 1 tablet (5 mg total) by mouth every evening. 01/03/24   Luke Orlan HERO, DO  metroNIDAZOLE  (METROGEL ) 0.75 % gel Apply 1 Application topically as needed.    [provider]  olmesartan  (BENICAR ) 20 MG tablet Take 20 mg by mouth daily. 11/03/18   [provider]  Olopatadine -Mometasone  (RYALTRIS ) [id number] MCG/ACT SUSP Place 1-2 sprays into the nose in the morning and at bedtime. Patient taking differently: Place 1-2 sprays into the nose 2 (two) times daily as needed. 09/20/23   Luke Orlan HERO, DO  Omeprazole-Sodium Bicarbonate (ZEGERID PO) Take 1 tablet by mouth as needed.    [provider]  ondansetron  (ZOFRAN -ODT) 4 MG disintegrating tablet Take 4 mg by mouth as needed for nausea or vomiting. 12/15/22   [provider]  RABEprazole (ACIPHEX) 20 MG tablet Take 20 mg by mouth daily.    [provider]  rizatriptan  (MAXALT -MLT) 10 MG disintegrating tablet Take 10 mg by mouth every 2 (two) hours as needed for migraine.  06/18/15   [provider]  rosuvastatin  (CRESTOR ) 5 MG tablet Take 1 tablet (5 mg total) by mouth daily. 11/10/23   Jeffrie Oneil BROCKS, MD  scopolamine  (TRANSDERM-SCOP) 1 MG/3DAYS Place 1 patch (1.5 mg total) onto the skin every 3 (three) days. Patient taking differently: Place 0.5 patches onto the skin as needed. 12/17/22   Barbarann Nest, MD  Vitamin D, Ergocalciferol, (DRISDOL) 50000 units CAPS capsule Take 50,000 Units by mouth every 7 (seven) days.    [provider]  ZEPBOUND 5 MG/0.5ML Pen Inject 5 mg into the skin  once a week. 02/17/24   [provider]    Allergies: Augmentin [amoxicillin-pot clavulanate], Betadine [povidone iodine], Contrast media [iodinated contrast media], Morphine and codeine, Prednisone , Yellow jacket venom [bee venom], Azithromycin, Erythromycin, Iohexol , Oxycontin [oxycodone], Avelox [moxifloxacin hcl in nacl], Codeine, Gadolinium derivatives, Iodine, Lamisil  [terbinafine ], Levaquin [levofloxacin in d5w], Oxycodone-acetaminophen , Pantoprazole , Praluent  [alirocumab ], Pravastatin , Rosuvastatin , Shellfish allergy , Dilaudid [hydromorphone hcl], Oxycodone hcl, and Tramadol    Review of Systems  Cardiovascular:  Positive for chest pain.  Gastrointestinal:  Positive for vomiting.  Neurological:  Positive for headaches.    Updated Vital Signs BP (!) 123/92 (BP Location: Left Arm)   Pulse 87   Temp 98 F (36.7 C) (Oral)   Resp 17   Ht 5' 5 (1.651 m)   Wt 99.8 kg   SpO2 97%   BMI 36.61 kg/m   Physical Exam Vitals and nursing note reviewed.  Constitutional:      General: She is not in acute distress.    Appearance: She is well-developed.  HENT:     Head: Normocephalic and atraumatic.     Mouth/Throat:     Mouth: Mucous membranes are dry.  Eyes:     Pupils: Pupils are equal, round, and reactive to light.  Cardiovascular:     Rate and Rhythm: Normal rate and regular rhythm.     Heart sounds: Normal heart sounds. No murmur heard.    No friction rub.  Pulmonary:     Effort: Pulmonary effort is normal.     Breath sounds: Normal breath sounds. No wheezing or rales.    Chest:     Chest wall: Tenderness present.    Abdominal:     General: Bowel sounds are normal. There is no distension.     Palpations: Abdomen is soft.     Tenderness: There is abdominal tenderness in the right upper quadrant, epigastric area and left upper quadrant. There is no guarding or rebound.  Musculoskeletal:        General: No tenderness. Normal range of motion.     Right lower leg:  No edema.     Left lower leg: No edema.     Comments: No edema  Skin:    General: Skin is warm and dry.     Findings: No rash.  Neurological:     Mental Status: She is alert and oriented to person, place, and time. Mental status is at baseline.     Cranial Nerves: No cranial nerve deficit.  Psychiatric:        Behavior: Behavior normal.     (all labs ordered are listed, but only abnormal results are displayed) Labs Reviewed  COMPREHENSIVE METABOLIC PANEL WITH GFR - Abnormal; Notable for the following components:      Result Value   Potassium 3.4 (*)    Glucose, Bld 120 (*)    Creatinine, Ser 1.03 (*)    Calcium  8.6 (*)    AST 14 (*)    All other components within normal limits  CBC WITH DIFFERENTIAL/PLATELET  LIPASE, BLOOD  MAGNESIUM  HEMOGLOBIN A1C  TROPONIN I (HIGH SENSITIVITY)    EKG: EKG Interpretation Date/Time:  Thursday March 15 2024 10:43:08 EDT Ventricular Rate:  83 PR Interval:  177 QRS Duration:  83 QT Interval:  363 QTC Calculation: 427 R Axis:   -1  Text Interpretation: Sinus rhythm Low voltage, precordial leads No significant change since last tracing Confirmed by Doretha Folks ([id number]) on 03/15/2024 11:04:49 AM  Radiology: [facility] Chest 2 View Result Date: 03/15/2024 CLINICAL DATA:  Chest pain. EXAM: CHEST - 2 VIEW COMPARISON:  02/23/2024. FINDINGS: Focal scarring/atelectasis noted at the right lung base-middle lobe. Bilateral lung fields are otherwise clear. No acute consolidation or lung collapse. Bilateral costophrenic angles are clear. Normal cardio-mediastinal silhouette. No acute osseous abnormalities. The soft tissues are within normal limits. There are surgical clips in the right upper quadrant, typical of a previous cholecystectomy. IMPRESSION: No active cardiopulmonary disease. Electronically Signed   By: Ree Molt M.D.   On: 03/15/2024 12:05     Procedures   Medications Ordered in the ED  ondansetron  (ZOFRAN ) injection 4 mg (4 mg  Intravenous Given 03/15/24 1121)  sodium chloride  0.9 % bolus 1,000 mL (0 mLs Intravenous Stopped 03/15/24 1226)  ketorolac  (TORADOL ) 15 MG/ML injection 15 mg (15 mg Intravenous Given 03/15/24 1255)  Medical Decision Making Amount and/or Complexity of Data Reviewed External Data Reviewed: notes. Labs: ordered. Decision-making details documented in ED Course. Radiology: ordered and independent interpretation performed. Decision-making details documented in ED Course. ECG/medicine tests: ordered and independent interpretation performed. Decision-making details documented in ED Course.  Risk Prescription drug management.   Pt with multiple medical problems and comorbidities and presenting today with a complaint that caries a high risk for morbidity and mortality.  Here today with the above complaints.  Concern for possible reaction to Zepbound and possibility for an allergic component.  Also concern for dehydration, hepatitis, pancreatitis, gastritis, colitis, AKI.  Lower suspicion for PE at this time due to patient's length of time of symptoms, normal heart rate and normal O2 sats.  Suspect patient's chest pain currently is most likely related to recurrent vomiting.  She has had cardiac evaluation in the past and a normal catheterization but also a CTA coronary study done within the last 5 years that had a 0 calcium  score and coronary arteries were normal.  Will check labs.  Screening EKG which I independently interpreted was within normal limits.  Patient given IV fluids and Zofran .  I independently interpreted patient's labs and CBC, lipase, troponin, magnesium are all within normal limits, CMP with mild bump in creatinine to 1.03 from her baseline of 0.8 but no other significant findings. I have independently visualized and interpreted pt's images today.  Chest x-ray within normal limits.  All the findings were discussed with the patient.  She is starting to feel  better after fluids, Zofran  and Toradol .  Recommended that she discontinue the Zepbound as there is concerned that that could be the cause of her symptoms.      Final diagnoses:  Dehydration  Nausea vomiting and diarrhea  Non-dose-related adverse effect of medication, initial encounter  Intractable headache, unspecified chronicity pattern, unspecified headache type    ED Discharge Orders          Ordered    HYDROcodone -acetaminophen  (HYCET) 7.5-325 mg/15 ml solution  Every 6 hours PRN        03/15/24 1441               Doretha Folks, MD 03/15/24 1442    Doretha Folks, MD 03/15/24 1443

## 2024-03-19 ENCOUNTER — Ambulatory Visit: Payer: PRIVATE HEALTH INSURANCE | Admitting: Allergy

## 2024-03-23 ENCOUNTER — Ambulatory Visit: Payer: PRIVATE HEALTH INSURANCE | Admitting: Podiatry

## 2024-03-23 ENCOUNTER — Telehealth: Payer: Self-pay | Admitting: Podiatry

## 2024-03-23 NOTE — Telephone Encounter (Signed)
 Patient is requesting suggestions to get her toe nails trimmed while in between getting rfc at [facility]. Patient is going to be unavailable during the 3rd week of September (traveling)  Patient

## 2024-04-09 ENCOUNTER — Ambulatory Visit: Payer: PRIVATE HEALTH INSURANCE | Admitting: Allergy

## 2024-04-09 NOTE — Progress Notes (Deleted)
 Follow Up Note  RE: [name] [name] MRN: [id number] DOB: [birth year] Date of Office Visit: 04/09/2024  Referring provider: Loreli Elsie [name], MD Primary care provider: Loreli Elsie [name], MD  Chief Complaint: No chief complaint on file.  History of Present Illness: I had the pleasure of seeing [name] [name] for a follow up visit at the [facility] on 04/09/2024. She is a 58 y.o. female, who is being followed for hymenoptera allergy , allergic reactions, multiple drug allergies, allergic rhinitis, Dercum disease. Her previous allergy  office visit was on 09/20/2023 with Dr. Luke. Today is a regular follow up visit.  Discussed the use of AI scribe software for clinical note transcription with the patient, who gave verbal consent to proceed.  History of Present Illness            ***  Assessment and Plan: Tashaya is a 58 y.o. female with: Hymenoptera allergy  Past history - respiratory symptoms after getting stung by a yellow jacket. 2020 Hymenoptera panel was positive to honeybee, white faced hornet, yellow jacket, wasp, yellow hornet and bumblebee. Borderline to fire  ant. Used Epipen  in June 2023 after an ant sting. 2024 bloodwork positive to white face hornet, yellow jacket, wasp and yellow hornet; borderline to fire  ant. Interim history - had to restart multiple times and currently still building up. Some localized reactions post injection. No stings since the last visit. Continue mixed vespid injections - given today.  Will do modify build up schedule due to her history of reactions with injections. For strength 78mcg/mL (0.05, 0.1, 0.2, 0.3, 0.4) For strength 72mcg/mL (0.05, 0.1, 0.2, 0.3, 0.4) For strength 352mcg/mL (0.05, 0.1 and increase by 0.1 until reached 1cc maintenance dose). Make sure you take your allergy  medication the day of injection. Make sure you bring your Epipen  for every visit and wait 30 minutes at the office.  Recommend coming in  weekly so we can build up to the maintenance dose where it is the most effective.   Will need to add wasp as well.    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 Shellfish panel and seafood panel were all negative. The scallop and oyster were borderline positive. Tryptase, blood count, urticaria index, and alpha gal were all normal. 2,3 Dinor-11Beta-Prostaglandin F2 Alpha, Urine - 2157pg/mg Cr (ref <5205pg/mg Cr) - normal.  Interim history - minor reactions which were treated with Benadryl .  Consider Xolair injections (300mg  every 4 weeks) - this is used sometimes off label for allergic reactions. Will hold on this for now.  Continue to avoid scallops and red dye. Keep track of reactions. If you have a reaction next time, please get tryptase level drawn within 2-3 hours. This will help in identifying if it's truly an allergic reaction or some other type of adverse reaction that you are having.  For mild symptoms you can take over the counter antihistamines such as Benadryl  1-2 tablets = 25-50mg  and monitor symptoms closely. If symptoms worsen or if you have severe symptoms including breathing issues, throat closure, significant swelling, whole body hives, severe diarrhea and vomiting, lightheadedness then inject epinephrine  and seek immediate medical care afterwards. Emergency action plan in place.    Multiple drug allergies Past history - Patient is sensitive to various medications. Some of the reactions are more adverse drug reactions rather than  IgE mediated reactions. Continue to avoid medications which gave her issues in the past. See allergy  list.    Seasonal allergic rhinitis due to pollen Past history - 2024 bloodwork positive to grass. Interim history - Persistent postnasal drip and nasal  congestion. Recent ENT consultation recommended trial of steroid nasal spray. Start Ryaltris  (olopatadine  + mometasone  nasal spray combination) 1-2 sprays per nostril twice a day. Sample given. This replaces your other nasal sprays. If this works well for you, then have pharmacy ship the medication to your home - prescription already sent in.  Nasal saline spray (i.e., Simply Saline) or nasal saline lavage (i.e., NeilMed) is recommended as needed and prior to medicated nasal sprays. Use over the counter antihistamines such as Zyrtec (cetirizine), Claritin (loratadine), Allegra  (fexofenadine ), or Xyzal  (levocetirizine) daily as needed. May take twice a day during allergy  flares. May switch antihistamines every few months. May use olopatadine  eye drops 0.2% once a day as needed for itchy/watery eyes   Tinnitus Follow up with ENT as scheduled.   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 Continue follow up with dermatology, neurology, cardiology. Assessment and Plan              No follow-ups on file.  No orders of the defined types were placed in this encounter.  Lab Orders  No laboratory test(s) ordered today    Diagnostics: Spirometry:  Tracings reviewed. Her effort: {Blank single:19197::Good reproducible efforts.,It was hard to get consistent efforts and there is a question as to whether this reflects a maximal maneuver.,Poor effort, data can not be interpreted.} FVC: ***L FEV1: ***L, ***% predicted FEV1/FVC ratio: ***% Interpretation: {Blank single:19197::Spirometry consistent with mild obstructive disease,Spirometry consistent with moderate obstructive disease,Spirometry consistent with severe obstructive disease,Spirometry consistent with possible restrictive disease,Spirometry consistent with mixed obstructive and restrictive disease,Spirometry uninterpretable due to technique,Spirometry  consistent with normal pattern,No overt abnormalities noted given today's efforts}.  Please see scanned spirometry results for details.  Skin Testing: {Blank single:19197::Select foods,Environmental allergy  panel,Environmental allergy  panel and select foods,Food allergy  panel,None,Deferred due to recent antihistamines use}. *** Results discussed with patient/family.   Medication List:  Current Outpatient Medications  Medication Sig Dispense Refill  . albuterol  (VENTOLIN  HFA) 108 (90 Base) MCG/ACT inhaler Inhale 2 puffs into the lungs every 4 (four) hours as needed.    . aspirin  EC 81 MG tablet Take 81 mg by mouth daily. Swallow whole.    . Ciclopirox  1 % shampoo Apply 1 Application topically as needed.    . clindamycin (CLEOCIN T) 1 % lotion Apply 1 Application topically 2 (two) times daily.    . clobetasol (TEMOVATE) 0.05 % external solution Apply 1 Application topically daily.    . Cyanocobalamin (VITAMIN B-12 IJ) Inject 1 Dose as directed every 30 (thirty) days.    . diclofenac Sodium (VOLTAREN) 1 % GEL Apply 2 g topically as needed.    . EPINEPHrine  (EPIPEN  2-PAK) 0.3 mg/0.3 mL IJ SOAJ injection Inject 0.3 mg into the muscle as needed. 1 each 1  . ezetimibe  (ZETIA ) 10 MG tablet Take 1 tablet (10 mg total) by mouth daily. 90 tablet 3  . fexofenadine  (ALLEGRA ) 180 MG tablet Take 1 tablet (180 mg total) by mouth 2 (two) times daily as needed for allergies or rhinitis. 60 tablet 5  . HYDROcodone -acetaminophen  (HYCET) 7.5-325 mg/15 ml solution Take 15 mLs by mouth every 6 (six) hours as needed for severe pain (pain score 7-10). 120 mL 0  .  hydrocortisone 2.5 % cream Apply 1 Application topically as needed.    SABRA ibuprofen (ADVIL) 800 MG tablet Take 800 mg by mouth every 8 (eight) hours as needed for mild pain (pain score 1-3) or moderate pain (pain score 4-6).    . icosapent  Ethyl (VASCEPA ) 1 g capsule Take 2 capsules (2 g total) by mouth 2 (two) times daily. (Patient taking  differently: Take 2 g by mouth at bedtime.) 360 capsule 3  . ipratropium (ATROVENT ) 0.03 % nasal spray Place 1-2 sprays into both nostrils 2 (two) times daily as needed (nasal drainage). 30 mL 5  . ketoconazole  (NIZORAL ) 2 % cream Apply 1 Application topically daily. (Patient taking differently: Apply 1 Application topically as needed for irritation.) 60 g 0  . ketorolac  (TORADOL ) 10 MG tablet Take 1 tablet (10 mg total) by mouth every 6 (six) hours as needed. 10 tablet 0  . levocetirizine (XYZAL ) 5 MG tablet Take 1 tablet (5 mg total) by mouth every evening. 30 tablet 3  . metroNIDAZOLE  (METROGEL ) 0.75 % gel Apply 1 Application topically as needed.    . olmesartan  (BENICAR ) 20 MG tablet Take 20 mg by mouth daily.    . Olopatadine -Mometasone  (RYALTRIS ) 665-25 MCG/ACT SUSP Place 1-2 sprays into the nose in the morning and at bedtime. (Patient taking differently: Place 1-2 sprays into the nose 2 (two) times daily as needed.) 29 g 5  . Omeprazole-Sodium Bicarbonate (ZEGERID PO) Take 1 tablet by mouth as needed.    . ondansetron  (ZOFRAN -ODT) 4 MG disintegrating tablet Take 4 mg by mouth as needed for nausea or vomiting.    . RABEprazole (ACIPHEX) 20 MG tablet Take 20 mg by mouth daily.    . rizatriptan  (MAXALT -MLT) 10 MG disintegrating tablet Take 10 mg by mouth every 2 (two) hours as needed for migraine.   11  . rosuvastatin  (CRESTOR ) 5 MG tablet Take 1 tablet (5 mg total) by mouth daily. 90 tablet 3  . scopolamine  (TRANSDERM-SCOP) 1 MG/3DAYS Place 1 patch (1.5 mg total) onto the skin every 3 (three) days. (Patient taking differently: Place 0.5 patches onto the skin as needed.) 10 patch 1  . Vitamin D, Ergocalciferol, (DRISDOL) 50000 units CAPS capsule Take 50,000 Units by mouth every 7 (seven) days.    SABRA ZEPBOUND 5 MG/0.5ML Pen Inject 5 mg into the skin once a week.     No current facility-administered medications for this visit.   Allergies: Allergies  Allergen Reactions  . Augmentin  [Amoxicillin-Pot Clavulanate] Anaphylaxis and Nausea And Vomiting  . Betadine [Povidone Iodine]     Throat swelling, itchy lips, redness/swelling at application site.  . Contrast Media [Iodinated Contrast Media] Anaphylaxis  . Morphine And Codeine Anaphylaxis and Nausea And Vomiting  . Prednisone  Anaphylaxis  . Yellow Jacket Venom [Bee Venom] Anaphylaxis  . Azithromycin Other (See Comments)    SEVERE STOMACH PAIN   . Erythromycin Nausea And Vomiting and Other (See Comments)    SEVERE STOMACH PAIN/abdominal pain   . Iohexol  Hives, Itching and Swelling    Swelling of upper lip, thick tongue, hives on face and back and itching all over  . Oxycontin [Oxycodone] Nausea And Vomiting  . Avelox [Moxifloxacin Hcl In Nacl] Swelling  . Codeine Nausea And Vomiting and Other (See Comments)    Headaches also  . Gadolinium Derivatives Hives, Itching and Swelling  . Iodine   . Lamisil  [Terbinafine ]     Pt stated, It made me feel like I had the flu  . Levaquin [Levofloxacin  In D5w] Swelling  . Oxycodone-Acetaminophen  Nausea And Vomiting  . Pantoprazole  Other (See Comments)    Feels like I have the flu  . Praluent  [Alirocumab ]     Swelling in leg, pain, dizziness  . Pravastatin      Myalgias on 20mg  daily  . Rosuvastatin      Myalgias and cramps, sore throat, indigestion on 10mg , tolerates 5mg  ok  . Shellfish Allergy      Intolerant to scallops, can eat fish  . Dilaudid [Hydromorphone Hcl] Nausea And Vomiting  . Oxycodone Hcl Nausea And Vomiting  . Tramadol Nausea Only   I reviewed her past medical history, social history, family history, and environmental history and no significant changes have been reported from her previous visit.  Review of Systems  Constitutional:  Negative for appetite change, chills, fever and unexpected weight change.  HENT:  Positive for tinnitus. Negative for congestion and rhinorrhea.   Eyes:  Negative for itching.  Respiratory:  Negative for cough, chest  tightness, shortness of breath and wheezing.   Cardiovascular:  Negative for chest pain.  Gastrointestinal:  Negative for abdominal pain.  Genitourinary:  Negative for difficulty urinating.  Skin:  Negative for rash.  Allergic/Immunologic: Positive for environmental allergies.  Neurological:  Negative for headaches.    Objective: There were no vitals taken for this visit. There is no height or weight on file to calculate BMI. Physical Exam Vitals and nursing note reviewed.  Constitutional:      Appearance: Normal appearance. She is well-developed.  HENT:     Head: Normocephalic and atraumatic.     Right Ear: Tympanic membrane and external ear normal.     Left Ear: Tympanic membrane and external ear normal.     Nose: Nose normal.     Mouth/Throat:     Mouth: Mucous membranes are moist.     Pharynx: Oropharynx is clear.  Eyes:     Conjunctiva/sclera: Conjunctivae normal.  Cardiovascular:     Rate and Rhythm: Normal rate and regular rhythm.     Heart sounds: Normal heart sounds. No murmur heard.    No friction rub. No gallop.  Pulmonary:     Effort: Pulmonary effort is normal.     Breath sounds: Normal breath sounds. No wheezing, rhonchi or rales.  Musculoskeletal:     Cervical back: Neck supple.  Skin:    General: Skin is warm.     Findings: No rash.  Neurological:     Mental Status: She is alert and oriented to person, place, and time.  Psychiatric:        Behavior: Behavior normal.   Previous notes and tests were reviewed. The plan was reviewed with the patient/family, and all questions/concerned were addressed.  It was my pleasure to see Dekayla today and participate in her care. Please feel free to contact me with any questions or concerns.  Sincerely,  Orlan Cramp, DO Allergy  & Immunology  [facility]  [place] office: [phone] [facility] office: [phone]

## 2024-04-16 ENCOUNTER — Encounter: Payer: Self-pay | Admitting: Adult Health

## 2024-04-16 ENCOUNTER — Ambulatory Visit: Payer: PRIVATE HEALTH INSURANCE | Admitting: Adult Health

## 2024-04-16 ENCOUNTER — Ambulatory Visit (INDEPENDENT_AMBULATORY_CARE_PROVIDER_SITE_OTHER): Payer: PRIVATE HEALTH INSURANCE | Admitting: Allergy

## 2024-04-16 ENCOUNTER — Encounter: Payer: Self-pay | Admitting: Allergy

## 2024-04-16 VITALS — BP 127/74 | HR 77 | Ht 65.0 in | Wt 233.4 lb

## 2024-04-16 VITALS — BP 122/88 | HR 73 | Temp 98.6°F | Resp 16 | Ht 65.0 in | Wt 227.0 lb

## 2024-04-16 DIAGNOSIS — J301 Allergic rhinitis due to pollen: Secondary | ICD-10-CM

## 2024-04-16 DIAGNOSIS — G4733 Obstructive sleep apnea (adult) (pediatric): Secondary | ICD-10-CM

## 2024-04-16 DIAGNOSIS — Z889 Allergy status to unspecified drugs, medicaments and biological substances status: Secondary | ICD-10-CM | POA: Diagnosis not present

## 2024-04-16 DIAGNOSIS — T7840XD Allergy, unspecified, subsequent encounter: Secondary | ICD-10-CM

## 2024-04-16 DIAGNOSIS — Z91038 Other insect allergy status: Secondary | ICD-10-CM

## 2024-04-16 MED ORDER — IPRATROPIUM BROMIDE 0.03 % NA SOLN: 1.0000 | Freq: Two times a day (BID) | NASAL | 5 refills | Status: AC | PRN

## 2024-04-16 MED ORDER — NEFFY 2 MG/0.1ML NA SOLN: 1.0000 | NASAL | 1 refills | Status: AC | PRN

## 2024-04-16 MED ORDER — RYALTRIS 665-25 MCG/ACT NA SUSP: 1.0000 | Freq: Two times a day (BID) | NASAL | 5 refills | Status: AC

## 2024-04-16 MED ORDER — EPINEPHRINE 0.3 MG/0.3ML IJ SOAJ: 0.3000 mg | INTRAMUSCULAR | 1 refills | Status: AC | PRN

## 2024-04-16 NOTE — Patient Instructions (Addendum)
 Hymenoptera allergy  Continue to avoid. Stop venom injections.  I have prescribed epinephrine  device (Neffy ) and demonstrated proper use. For mild symptoms you can take over the counter antihistamines such as zyrtec 10mg  to 20mg  and monitor symptoms closely. If symptoms worsen or if you have severe symptoms including breathing issues, throat closure, significant swelling, whole body hives, severe diarrhea and vomiting, lightheadedness then spray Neffy  in the nose and seek immediate medical care afterwards. Do not use any nasal sprays for 2 weeks afterwards.  If Neffy  is not covered let me know.  Emergency action plan provided.   Allergic reaction Continue to avoid scallops and red dye. Keep track of reactions. If you have a reaction next time, please get tryptase level drawn within 2-3 hours. This will help in identifying if it's truly an allergic reaction or some other type of adverse reaction that you are having.    Allergic rhinitis Bloodwork positive to grass pollen. May use Ryaltris  (olopatadine  + mometasone  nasal spray combination) 1-2 sprays per nostril twice a day.  Use Atrovent  (ipratropium) 0.03% 1-2 sprays per nostril twice a day as needed for runny nose/drainage. Nasal saline spray (i.e., Simply Saline) or nasal saline lavage (i.e., NeilMed) is recommended as needed and prior to medicated nasal sprays. Use over the counter antihistamines such as Zyrtec (cetirizine), Claritin (loratadine), Allegra  (fexofenadine ), or Xyzal  (levocetirizine) daily as needed. May take twice a day during allergy  flares. May switch antihistamines every few months. May use olopatadine  eye drops 0.2% once a day as needed for itchy/watery eyes   Multiple drug allergies Continue to avoid medications on allergy  list.     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  Return in about 12 months or sooner if needed.  Follow up with ENT as scheduled.   Reducing Pollen  Exposure Pollen seasons: trees (spring), grass (summer) and ragweed/weeds (fall). Keep windows closed in your home and car to lower pollen exposure.  Install air conditioning in the bedroom and throughout the house if possible.  Avoid going out in dry windy days - especially early morning. Pollen counts are highest between 5 - 10 AM and on dry, hot and windy days.  Save outside activities for late afternoon or after a heavy rain, when pollen levels are lower.  Avoid mowing of grass if you have grass pollen allergy . Be aware that pollen can also be transported indoors on people and pets.  Dry your clothes in an automatic dryer rather than hanging them outside where they might collect pollen.  Rinse hair and eyes before bedtime.

## 2024-04-16 NOTE — Progress Notes (Signed)
 Follow Up Note  RE: [name] [name] MRN: [id number] DOB: [birth year] Date of Office Visit: 04/16/2024  Referring provider: Loreli Elsie [name], MD Primary care provider: Loreli Elsie [name], MD  Chief Complaint: Follow-up (She says she may be allergy  to  dawn dish soap - she stuffy and congestion nose. She stopped venom injection three month ago. )  History of Present Illness: I had the pleasure of seeing [name] [name] for a follow up visit at the [facility] on 04/16/2024. She is a 58 y.o. female, who is being followed for hymenoptera allergy , allergic reactions, multiple drug allergies, allergic rhinitis, Dercum disease. Her previous allergy  office visit was on 09/20/2023 with Dr. Luke. Today is a regular follow up visit.  Discussed the use of AI scribe software for clinical note transcription with the patient, who gave verbal consent to proceed.    She discontinued her allergy  shots for venom due to significant inflammation in her hands and feet, described as 'blood red' and 'sunburned', accompanied by severe stiffness and pain, making movement difficult. She reported that her symptoms seemed to worsen after receiving the shots, which prompted her to stop them. She has not experienced any insect stings since discontinuation but continues to carry an EpiPen .  She experiences persistent nasal symptoms, including rhinorrhea, congestion, and throat clearing. She uses Ryaltris  and ipratropium nasal sprays for these symptoms. She questions the effectiveness of her current antihistamine, Xyzal .  She has a history of severe reactions to medications, including a significant reaction to Zepbound, which caused chest pain, back pain, vomiting, and diarrhea. She describes a 'chemical anaphylaxis' type reaction, similar to a past reaction to CT contrast. Symptoms resolved upon discontinuation of Zepbound.  She has a history of severe reactions to CT contrast, described as 'the  worst' her husband, a Art therapist, had ever seen. She is anxious about undergoing MRI with contrast due to these past experiences and a lack of clear communication from medical staff during a previous attempt.  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 Exercise exacerbates her condition.     Assessment and Plan: Janera is a 58 y.o. female with: Hymenoptera allergy  Past history - respiratory symptoms after getting stung by a yellow jacket. 2020 Hymenoptera panel was positive to honeybee, white faced hornet, yellow jacket, wasp, yellow hornet and bumblebee. Borderline to fire  ant. Used Epipen  in June 2023 after an ant sting. 2024 bloodwork positive to white face hornet, yellow jacket, wasp and yellow hornet; borderline to fire  ant. Interim history - stopped VIT in March 2025 due to joint inflammation. No stings since the last visit and doing better.  Continue to avoid. Stop venom injections.  I have prescribed epinephrine  device (Neffy ) and demonstrated proper use. For mild symptoms you can take over the counter antihistamines such as zyrtec 10mg  to 20mg  and monitor symptoms closely. If symptoms worsen or if you have severe symptoms including breathing issues, throat closure, significant swelling, whole body hives, severe diarrhea and vomiting, lightheadedness then spray Neffy  in the nose and seek immediate medical care afterwards. Do not use any nasal sprays for 2 weeks afterwards.  If Neffy  is not covered let me know.  Emergency action plan provided.    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 Shellfish panel and seafood panel  were all negative. The scallop and oyster were borderline positive. Tryptase, blood count, urticaria index, and alpha gal  were all normal. 2,3 Dinor-11Beta-Prostaglandin F2 Alpha, Urine - 2157pg/mg Cr (ref <5205pg/mg Cr) - normal.  Interim history - no major reactions.  Continue to avoid scallops and red dye. Keep track of reactions. If you have a reaction next time, please get tryptase level drawn within 2-3 hours. This will help in identifying if it's truly an allergic reaction or some other type of adverse reaction that you are having.    Multiple drug allergies Past history - Patient is sensitive to various medications. Some of the reactions are more adverse drug reactions rather than IgE mediated reactions. Continue to avoid medications which gave her issues in the past. See allergy  list.    Seasonal allergic rhinitis due to pollen Past history - 2024 bloodwork positive to grass. Saw ENT in the past.  Interim history - Persistent drainage.  May use Ryaltris  (olopatadine  + mometasone  nasal spray combination) 1-2 sprays per nostril twice a day.  Use Atrovent  (ipratropium) 0.03% 1-2 sprays per nostril twice a day as needed for runny nose/drainage. Nasal saline spray (i.e., Simply Saline) or nasal saline lavage (i.e., NeilMed) is recommended as needed and prior to medicated nasal sprays. Use over the counter antihistamines such as Zyrtec (cetirizine), Claritin (loratadine), Allegra  (fexofenadine ), or Xyzal  (levocetirizine) daily as needed. May take twice a day during allergy  flares. May switch antihistamines every few months. May use olopatadine  eye drops 0.2% once a day as needed for itchy/watery eyes   Dercum disease Past history - Patient was diagnosed with Dercum's disease by dermatology at [facility]. Continue follow up with dermatology, neurology, cardiology.  Return in about 1 year (around 04/16/2025).  Meds ordered this encounter  Medications   Olopatadine -Mometasone  (RYALTRIS ) 665-25 MCG/ACT SUSP    Sig: Place 1-2 sprays into the nose in the morning and at bedtime.    Dispense:  29 g    Refill:   5   ipratropium (ATROVENT ) 0.03 % nasal spray    Sig: Place 1-2 sprays into both nostrils 2 (two) times daily as needed (nasal drainage).    Dispense:  30 mL    Refill:  5   EPINEPHrine  (NEFFY ) 2 MG/0.1ML SOLN    Sig: Place 1 Dose into the nose as needed (anaphylactic reaction).    Dispense:  4 each    Refill:  1    Z91.148   EPINEPHrine  0.3 mg/0.3 mL IJ SOAJ injection    Sig: Inject 0.3 mg into the muscle as needed for anaphylaxis.    Dispense:  2 each    Refill:  1    May dispense generic/Mylan/Teva brand.   Lab Orders  No laboratory test(s) ordered today    Diagnostics: None.    Medication List:  Current Outpatient Medications  Medication Sig Dispense Refill   albuterol  (VENTOLIN  HFA) 108 (90 Base) MCG/ACT inhaler Inhale 2 puffs into the lungs every 4 (four) hours as needed.     aspirin  EC 81 MG tablet Take 81 mg by mouth daily. Swallow whole.     Ciclopirox  1 % shampoo Apply 1 Application topically as needed.     clindamycin (CLEOCIN T) 1 % lotion Apply 1 Application topically 2 (two) times daily.     clobetasol (TEMOVATE) 0.05 % external solution Apply 1 Application topically daily.     Cyanocobalamin (VITAMIN B-12 IJ) Inject 1 Dose as directed every 30 (thirty) days.     diclofenac Sodium (  VOLTAREN) 1 % GEL Apply 2 g topically as needed.     EPINEPHrine  (NEFFY ) 2 MG/0.1ML SOLN Place 1 Dose into the nose as needed (anaphylactic reaction). 4 each 1   EPINEPHrine  0.3 mg/0.3 mL IJ SOAJ injection Inject 0.3 mg into the muscle as needed for anaphylaxis. 2 each 1   ezetimibe  (ZETIA ) 10 MG tablet Take 1 tablet (10 mg total) by mouth daily. 90 tablet 3   fexofenadine  (ALLEGRA ) 180 MG tablet Take 1 tablet (180 mg total) by mouth 2 (two) times daily as needed for allergies or rhinitis. (Patient taking differently: Take 180 mg by mouth as needed for allergies or rhinitis.) 60 tablet 5   HYDROcodone -acetaminophen  (HYCET) 7.5-325 mg/15 ml solution Take 15 mLs by mouth every 6 (six)  hours as needed for severe pain (pain score 7-10). 120 mL 0   hydrocortisone 2.5 % cream Apply 1 Application topically as needed.     icosapent  Ethyl (VASCEPA ) 1 g capsule Take 2 capsules (2 g total) by mouth 2 (two) times daily. (Patient taking differently: Take 2 g by mouth at bedtime.) 360 capsule 3   ipratropium (ATROVENT ) 0.03 % nasal spray Place 1-2 sprays into both nostrils 2 (two) times daily as needed (nasal drainage). 30 mL 5   ketoconazole  (NIZORAL ) 2 % cream Apply 1 Application topically daily. (Patient taking differently: Apply 1 Application topically as needed for irritation.) 60 g 0   ketorolac  (TORADOL ) 10 MG tablet Take 1 tablet (10 mg total) by mouth every 6 (six) hours as needed. 10 tablet 0   levocetirizine (XYZAL ) 5 MG tablet Take 1 tablet (5 mg total) by mouth every evening. 30 tablet 3   metroNIDAZOLE  (METROGEL ) 0.75 % gel Apply 1 Application topically as needed.     olmesartan  (BENICAR ) 20 MG tablet Take 20 mg by mouth daily.     Olopatadine -Mometasone  (RYALTRIS ) 665-25 MCG/ACT SUSP Place 1-2 sprays into the nose in the morning and at bedtime. 29 g 5   Omeprazole-Sodium Bicarbonate (ZEGERID PO) Take 1 tablet by mouth as needed.     ondansetron  (ZOFRAN -ODT) 4 MG disintegrating tablet Take 4 mg by mouth as needed for nausea or vomiting.     RABEprazole (ACIPHEX) 20 MG tablet Take 20 mg by mouth daily.     rizatriptan  (MAXALT -MLT) 10 MG disintegrating tablet Take 10 mg by mouth every 2 (two) hours as needed for migraine.   11   rosuvastatin  (CRESTOR ) 5 MG tablet Take 1 tablet (5 mg total) by mouth daily. 90 tablet 3   scopolamine  (TRANSDERM-SCOP) 1 MG/3DAYS Place 1 patch (1.5 mg total) onto the skin every 3 (three) days. (Patient taking differently: Place 0.5 patches onto the skin as needed.) 10 patch 1   Vitamin D, Ergocalciferol, (DRISDOL) 50000 units CAPS capsule Take 50,000 Units by mouth every 7 (seven) days.     ZEPBOUND 5 MG/0.5ML Pen Inject 5 mg into the skin once a  week.     No current facility-administered medications for this visit.   Allergies: Allergies  Allergen Reactions   Augmentin [Amoxicillin-Pot Clavulanate] Anaphylaxis and Nausea And Vomiting   Betadine [Povidone Iodine]     Throat swelling, itchy lips, redness/swelling at application site.   Contrast Media [Iodinated Contrast Media] Anaphylaxis   Morphine And Codeine Anaphylaxis and Nausea And Vomiting   Prednisone  Anaphylaxis   Yellow Jacket Venom [Bee Venom] Anaphylaxis   Azithromycin Other (See Comments)    SEVERE STOMACH PAIN    Erythromycin Nausea And Vomiting and Other (See Comments)  SEVERE STOMACH PAIN/abdominal pain    Iohexol  Hives, Itching and Swelling    Swelling of upper lip, thick tongue, hives on face and back and itching all over   Oxycontin [Oxycodone] Nausea And Vomiting   Avelox [Moxifloxacin Hcl In Nacl] Swelling   Codeine Nausea And Vomiting and Other (See Comments)    Headaches also   Gadolinium Derivatives Hives, Itching and Swelling   Iodine    Lamisil  [Terbinafine ]     Pt stated, It made me feel like I had the flu   Levaquin [Levofloxacin In D5w] Swelling   Oxycodone-Acetaminophen  Nausea And Vomiting   Pantoprazole  Other (See Comments)    Feels like I have the flu   Praluent  [Alirocumab ]     Swelling in leg, pain, dizziness   Pravastatin      Myalgias on 20mg  daily   Rosuvastatin      Myalgias and cramps, sore throat, indigestion on 10mg , tolerates 5mg  ok   Shellfish Allergy      Intolerant to scallops, can eat fish   Dilaudid [Hydromorphone Hcl] Nausea And Vomiting   Oxycodone Hcl Nausea And Vomiting   Tramadol Nausea Only   I reviewed her past medical history, social history, family history, and environmental history and no significant changes have been reported from her previous visit.  Review of Systems  Constitutional:  Negative for appetite change, chills, fever and unexpected weight change.  HENT:  Positive for congestion and  rhinorrhea.   Eyes:  Negative for itching.  Respiratory:  Negative for cough, chest tightness, shortness of breath and wheezing.   Cardiovascular:  Negative for chest pain.  Gastrointestinal:  Negative for abdominal pain.  Genitourinary:  Negative for difficulty urinating.  Skin:  Negative for rash.  Allergic/Immunologic: Positive for environmental allergies.  Neurological:  Negative for headaches.    Objective: BP 122/88 (BP Location: Left Arm, Patient Position: Sitting)   Pulse 73   Temp 98.6 F (37 C) (Temporal)   Resp 16   Ht 5' 5 (1.651 m)   Wt 227 lb (103 kg)   SpO2 97%   BMI 37.77 kg/m  Body mass index is 37.77 kg/m. Physical Exam Vitals and nursing note reviewed.  Constitutional:      Appearance: Normal appearance. She is well-developed.  HENT:     Head: Normocephalic and atraumatic.     Right Ear: Tympanic membrane and external ear normal.     Left Ear: Tympanic membrane and external ear normal.     Nose: Congestion and rhinorrhea present.     Mouth/Throat:     Mouth: Mucous membranes are moist.     Pharynx: Oropharynx is clear.  Eyes:     Conjunctiva/sclera: Conjunctivae normal.  Cardiovascular:     Rate and Rhythm: Normal rate and regular rhythm.     Heart sounds: Normal heart sounds. No murmur heard.    No friction rub. No gallop.  Pulmonary:     Effort: Pulmonary effort is normal.     Breath sounds: Normal breath sounds. No wheezing, rhonchi or rales.  Musculoskeletal:     Cervical back: Neck supple.  Skin:    General: Skin is warm.     Findings: No rash.  Neurological:     Mental Status: She is alert and oriented to person, place, and time.  Psychiatric:        Behavior: Behavior normal.    Previous notes and tests were reviewed. The plan was reviewed with the patient/family, and all questions/concerned were addressed.  It was my  pleasure to see Reshanda today and participate in her care. Please feel free to contact me with any questions or  concerns.  Sincerely,  Orlan Cramp, DO Allergy  & Immunology  [facility]  [place] office: [phone] [facility] office: [phone]

## 2024-04-16 NOTE — Progress Notes (Signed)
 PATIENT: [name] [name] DOB: 1967-02-07O  REASON FOR VISIT: follow up HISTORY FROM: patient PRIMARY NEUROLOGIST: Dr. Chalice  Chief Complaint  Patient presents with   Follow-up    Pt in alone Pt here for cpap f/u  Pt states fatigue . Pt states mask is blowing air in eyes Pt states in ED for last month      HISTORY OF PRESENT ILLNESS: Today 04/16/24:  [name] [name] is a 58 y.o. female with a history of OSA on CPAP. Returns today for follow-up.  Patient states that she has not been using the CPAP consistently.  She states that she has a hard time falling asleep because it blows air in her eyes.  She states that she likes to read before bedtime.  She is try to set an alarm to remind her to put it on but if she does send alarm it wakes her up and she cannot go back to sleep.  She also states that her husband disrupts her sleep.  He does not have a consistent work schedule therefore he comes in at different times a night.  She states that when she is home alone or is on vacation she sleeps well with the CPAP.  She is currently wearing the [employer] fullface.  Download is below       HISTORY   REVIEW OF SYSTEMS: Out of a complete 14 system review of symptoms, the patient complains only of the following symptoms, and all other reviewed systems are negative.  FSS ESS  ALLERGIES: Allergies  Allergen Reactions   Augmentin [Amoxicillin-Pot Clavulanate] Anaphylaxis and Nausea And Vomiting   Betadine [Povidone Iodine]     Throat swelling, itchy lips, redness/swelling at application site.   Contrast Media [Iodinated Contrast Media] Anaphylaxis   Morphine And Codeine Anaphylaxis and Nausea And Vomiting   Prednisone  Anaphylaxis   Yellow Jacket Venom [Bee Venom] Anaphylaxis   Azithromycin Other (See Comments)    SEVERE STOMACH PAIN    Erythromycin Nausea And Vomiting and Other (See Comments)    SEVERE STOMACH PAIN/abdominal pain    Iohexol  Hives, Itching and  Swelling    Swelling of upper lip, thick tongue, hives on face and back and itching all over   Oxycontin [Oxycodone] Nausea And Vomiting   Avelox [Moxifloxacin Hcl In Nacl] Swelling   Codeine Nausea And Vomiting and Other (See Comments)    Headaches also   Gadolinium Derivatives Hives, Itching and Swelling   Iodine    Lamisil  [Terbinafine ]     Pt stated, It made me feel like I had the flu   Levaquin [Levofloxacin In D5w] Swelling   Oxycodone-Acetaminophen  Nausea And Vomiting   Pantoprazole  Other (See Comments)    Feels like I have the flu   Praluent  [Alirocumab ]     Swelling in leg, pain, dizziness   Pravastatin      Myalgias on 20mg  daily   Rosuvastatin      Myalgias and cramps, sore throat, indigestion on 10mg , tolerates 5mg  ok   Shellfish Allergy      Intolerant to scallops, can eat fish   Dilaudid [Hydromorphone Hcl] Nausea And Vomiting   Oxycodone Hcl Nausea And Vomiting   Tramadol Nausea Only    HOME MEDICATIONS: Outpatient Medications Prior to Visit  Medication Sig Dispense Refill   albuterol  (VENTOLIN  HFA) 108 (90 Base) MCG/ACT inhaler Inhale 2 puffs into the lungs every 4 (four) hours as needed.     aspirin  EC 81 MG tablet Take 81 mg by mouth daily.  Swallow whole.     Ciclopirox  1 % shampoo Apply 1 Application topically as needed.     clindamycin (CLEOCIN T) 1 % lotion Apply 1 Application topically 2 (two) times daily.     clobetasol (TEMOVATE) 0.05 % external solution Apply 1 Application topically daily.     diclofenac Sodium (VOLTAREN) 1 % GEL Apply 2 g topically as needed.     EPINEPHrine  (EPIPEN  2-PAK) 0.3 mg/0.3 mL IJ SOAJ injection Inject 0.3 mg into the muscle as needed. 1 each 1   ezetimibe  (ZETIA ) 10 MG tablet Take 1 tablet (10 mg total) by mouth daily. 90 tablet 3   fexofenadine  (ALLEGRA ) 180 MG tablet Take 1 tablet (180 mg total) by mouth 2 (two) times daily as needed for allergies or rhinitis. (Patient taking differently: Take 180 mg by mouth as needed for  allergies or rhinitis.) 60 tablet 5   hydrocortisone 2.5 % cream Apply 1 Application topically as needed.     icosapent  Ethyl (VASCEPA ) 1 g capsule Take 2 capsules (2 g total) by mouth 2 (two) times daily. (Patient taking differently: Take 2 g by mouth at bedtime.) 360 capsule 3   ipratropium (ATROVENT ) 0.03 % nasal spray Place 1-2 sprays into both nostrils 2 (two) times daily as needed (nasal drainage). 30 mL 5   ketoconazole  (NIZORAL ) 2 % cream Apply 1 Application topically daily. (Patient taking differently: Apply 1 Application topically as needed for irritation.) 60 g 0   ketorolac  (TORADOL ) 10 MG tablet Take 1 tablet (10 mg total) by mouth every 6 (six) hours as needed. 10 tablet 0   levocetirizine (XYZAL ) 5 MG tablet Take 1 tablet (5 mg total) by mouth every evening. 30 tablet 3   metroNIDAZOLE  (METROGEL ) 0.75 % gel Apply 1 Application topically as needed.     olmesartan  (BENICAR ) 20 MG tablet Take 20 mg by mouth daily.     Olopatadine -Mometasone  (RYALTRIS ) 665-25 MCG/ACT SUSP Place 1-2 sprays into the nose in the morning and at bedtime. (Patient taking differently: Place 1-2 sprays into the nose 2 (two) times daily as needed.) 29 g 5   Omeprazole-Sodium Bicarbonate (ZEGERID PO) Take 1 tablet by mouth as needed.     ondansetron  (ZOFRAN -ODT) 4 MG disintegrating tablet Take 4 mg by mouth as needed for nausea or vomiting.     RABEprazole (ACIPHEX) 20 MG tablet Take 20 mg by mouth daily.     rizatriptan  (MAXALT -MLT) 10 MG disintegrating tablet Take 10 mg by mouth every 2 (two) hours as needed for migraine.   11   rosuvastatin  (CRESTOR ) 5 MG tablet Take 1 tablet (5 mg total) by mouth daily. 90 tablet 3   scopolamine  (TRANSDERM-SCOP) 1 MG/3DAYS Place 1 patch (1.5 mg total) onto the skin every 3 (three) days. (Patient taking differently: Place 0.5 patches onto the skin as needed.) 10 patch 1   Vitamin D, Ergocalciferol, (DRISDOL) 50000 units CAPS capsule Take 50,000 Units by mouth every 7 (seven)  days.     ZEPBOUND 5 MG/0.5ML Pen Inject 5 mg into the skin once a week.     Cyanocobalamin (VITAMIN B-12 IJ) Inject 1 Dose as directed every 30 (thirty) days.     HYDROcodone -acetaminophen  (HYCET) 7.5-325 mg/15 ml solution Take 15 mLs by mouth every 6 (six) hours as needed for severe pain (pain score 7-10). 120 mL 0   ibuprofen (ADVIL) 800 MG tablet Take 800 mg by mouth every 8 (eight) hours as needed for mild pain (pain score 1-3) or moderate pain (pain score  4-6).     No facility-administered medications prior to visit.    PAST MEDICAL HISTORY: Past Medical History:  Diagnosis Date   Anemia    many years ago   Anxiety    situational   Arthritis    hands (07/12/2012)   Asthma    seasonal    Chest pain    Chest pain at rest, on going 07/12/2012   Dercum's disease    DVT (deep venous thrombosis) (HCC)    Eczema    Family history of early CAD 07/12/2012   Fibromyalgia    GERD (gastroesophageal reflux disease)    Headache(784.0)    often; not daily (07/12/2012)   Hypertension    not on medications   Kidney stones    Migraine    Pneumonia    as a child   PONV (postoperative nausea and vomiting)    she states she gets very sick    PAST SURGICAL HISTORY: Past Surgical History:  Procedure Laterality Date   breast lift     CARDIAC CATHETERIZATION  07/11/2012   CHOLECYSTECTOMY N/A 09/18/2015   Procedure: LAPAROSCOPIC CHOLECYSTECTOMY;  Surgeon: Vicenta Poli, MD;  Location: [facility] OR;  Service: General;  Laterality: N/A;   FOOT SURGERY  09/2019   toe surgery   LEFT HEART CATHETERIZATION WITH CORONARY ANGIOGRAM N/A 07/11/2012   Procedure: LEFT HEART CATHETERIZATION WITH CORONARY ANGIOGRAM;  Surgeon: Dorn JINNY Lesches, MD;  Location[facility] C[facility] LAB;  Service: Cardiovascular;  Laterality: N/A;   REDUCTION MAMMAPLASTY Bilateral 10+ years ago   TONSILLECTOMY  ~ 1976   tubes and ovaries removed  2015   VAGINAL HYSTERECTOMY  ~ 2009    FAMILY HISTORY: Family History  Problem  Relation Age of Onset   Hiatal hernia Mother    Hypertension Mother    Depression Mother    Allergic rhinitis Mother    Heart disease Father    Stroke Maternal Grandfather    Chronic Renal Failure Paternal Grandmother    Drug abuse Son    Allergic rhinitis Brother    Allergic rhinitis Maternal Aunt     SOCIAL HISTORY: Social History   Socioeconomic History   Marital status: Married    Spouse name: Not on file   Number of children: Not on file   Years of education: Not on file   Highest education level: Not on file  Occupational History   Not on file  Tobacco Use   Smoking status: Never   Smokeless tobacco: Never  Vaping Use   Vaping status: Never Used  Substance and Sexual Activity   Alcohol use: No   Drug use: No   Sexual activity: Yes  Other Topics Concern   Not on file  Social History Narrative   Not on file   Social Drivers of Health   Financial Resource Strain: Low Risk  (12/13/2022)   Overall Financial Resource Strain (CARDIA)    Difficulty of Paying Living Expenses: Not very hard  Food Insecurity: No Food Insecurity (12/16/2022)   Hunger Vital Sign    Worried About Running Out of Food in the Last Year: Never true    Ran Out of Food in the Last Year: Never true  Transportation Needs: No Transportation Needs (12/16/2022)   PRAPARE - Administrator, Civil Service (Medical): No    Lack of Transportation (Non-Medical): No  Physical Activity: Not on file  Stress: No Stress Concern Present (12/13/2022)   [employer] of Occupational Health - Occupational Stress Questionnaire  Feeling of Stress : Not at all  Social Connections: Unknown (12/14/2022)   Received from [facility] Short Social Needs Screening - Social Connection    Would you like help with any of the following needs: food, medicine/medical supplies, transportation, loneliness, housing or utilities?: Not on file  Intimate Partner Violence: Not At Risk (12/16/2022)    Humiliation, Afraid, Rape, and Kick questionnaire    Fear of Current or Ex-Partner: No    Emotionally Abused: No    Physically Abused: No    Sexually Abused: No      PHYSICAL EXAM  Vitals:   04/16/24 1009  BP: 127/74  Pulse: 77  Weight: 233 lb 6.4 oz (105.9 kg)  Height: 5' 5 (1.651 m)   Body mass index is 38.84 kg/m.  Generalized: Well developed, in no acute distress  Chest: Lungs clear to auscultation bilaterally  Neurological examination  Mentation: Alert oriented to time, place, history taking. Follows all commands speech and language fluent Cranial nerve II-XII: Facial symmetry noted   DIAGNOSTIC DATA (LABS, IMAGING, TESTING) - I reviewed patient records, labs, notes, testing and imaging myself where available.  Lab Results  Component Value Date   WBC 9.9 03/15/2024   HGB 14.6 03/15/2024   HCT 45.3 03/15/2024   MCV 94.6 03/15/2024   PLT 286 03/15/2024      Component Value Date/Time   NA 137 03/15/2024 1123   NA 142 04/17/2020 1152   K 3.4 (L) 03/15/2024 1123   CL 103 03/15/2024 1123   CO2 23 03/15/2024 1123   GLUCOSE 120 (H) 03/15/2024 1123   BUN 13 03/15/2024 1123   BUN 13 04/17/2020 1152   CREATININE 1.03 (H) 03/15/2024 1123   CREATININE 0.89 03/18/2023 1001   CALCIUM  8.6 (L) 03/15/2024 1123   PROT 7.7 03/15/2024 1123   PROT 6.5 02/16/2021 1056   ALBUMIN 4.2 03/15/2024 1123   ALBUMIN 4.4 02/16/2021 1056   AST 14 (L) 03/15/2024 1123   AST 16 03/18/2023 1001   ALT 15 03/15/2024 1123   ALT 21 03/18/2023 1001   ALKPHOS 74 03/15/2024 1123   BILITOT 1.0 03/15/2024 1123   BILITOT 0.4 03/18/2023 1001   GFRNONAA >60 03/15/2024 1123   GFRNONAA >60 03/18/2023 1001   GFRAA 89 04/17/2020 1152   Lab Results  Component Value Date   CHOL 220 (H) 12/03/2019   HDL 76 12/03/2019   LDLCALC 119 (H) 12/03/2019   TRIG 144 12/03/2019   CHOLHDL 2.9 12/03/2019   Lab Results  Component Value Date   HGBA1C 5.5 03/15/2024    ASSESSMENT AND PLAN 58 y.o. year  old female  has a past medical history of Anemia, Anxiety, Arthritis, Asthma, Chest pain, Chest pain at rest, on going (07/12/2012), Dercum's disease, DVT (deep venous thrombosis) (HCC), Eczema, Family history of early CAD (07/12/2012), Fibromyalgia, GERD (gastroesophageal reflux disease), Headache(784.0), Hypertension, Kidney stones, Migraine, Pneumonia, and PONV (postoperative nausea and vomiting). here with:  OSA on CPAP  - CPAP compliance suboptimal - Good treatment of AHI  - Encourage patient to use CPAP nightly and > 4 hours each night - Mask refitting ordered - F/U in 1 year or sooner if needed  Orders Placed This Encounter  Procedures   For home use only DME continuous positive airway pressure (CPAP)    Mask refitting    Length of Need:   12 Months    Patient has OSA or probable OSA:   Yes    Is the patient currently using  CPAP in the home:   Yes    Settings:   Other see comments    CPAP supplies needed:   Mask, headgear, cushions, filters, heated tubing and water chamber      Duwaine Russell, MSN, NP-C 04/16/2024, 2:30 PM [facility] [address] [place], [place] [address] [phone]

## 2024-04-17 ENCOUNTER — Ambulatory Visit (INDEPENDENT_AMBULATORY_CARE_PROVIDER_SITE_OTHER): Payer: PRIVATE HEALTH INSURANCE | Admitting: Podiatry

## 2024-04-17 ENCOUNTER — Encounter: Payer: Self-pay | Admitting: Podiatry

## 2024-04-17 DIAGNOSIS — L600 Ingrowing nail: Secondary | ICD-10-CM | POA: Diagnosis not present

## 2024-04-17 DIAGNOSIS — B351 Tinea unguium: Secondary | ICD-10-CM | POA: Diagnosis not present

## 2024-04-17 NOTE — Progress Notes (Signed)
 Subjective: Chief Complaint  Patient presents with   Nail Problem    Rm14 routine foot care    58 year old female presents the office today for concerns of her nail started to get ingrown, dystrophy of the toenail and has been checked.  No swelling redness or drainage.  No open lesions.  No other concerns.  Objective: AAO x3, NAD DP/PT pulses palpable bilaterally, CRT less than 3 seconds Bilateral hallux nails are mild dystrophic and mildly ingrown but there is no edema, erythema or signs of infection.  They do appear to be growing out and more clear on the proximal nail fold they are attached to the underlying nailbed.  There is no tenderness the nails today.  No edema, erythema.  Overall the nails do appear to be growing out. No pain with calf compression, swelling, warmth, erythema  Assessment: 58 year old female with ingrown toenails, onychodystrophy  Plan: Ingrown toenails/onychodystrophy -There is a debrided the and the toenails x 10 any complications or bleeding.  They appear to growing out.  Discussed different topical medications to help.  Continue biotin supplements.   [name] DPM

## 2024-05-10 ENCOUNTER — Ambulatory Visit (INDEPENDENT_AMBULATORY_CARE_PROVIDER_SITE_OTHER): Payer: PRIVATE HEALTH INSURANCE

## 2024-05-10 ENCOUNTER — Ambulatory Visit: Payer: PRIVATE HEALTH INSURANCE | Admitting: Podiatry

## 2024-05-10 ENCOUNTER — Encounter: Payer: Self-pay | Admitting: Podiatry

## 2024-05-10 VITALS — Ht 65.0 in | Wt 227.0 lb

## 2024-05-10 DIAGNOSIS — M778 Other enthesopathies, not elsewhere classified: Secondary | ICD-10-CM | POA: Diagnosis not present

## 2024-05-10 DIAGNOSIS — M7989 Other specified soft tissue disorders: Secondary | ICD-10-CM | POA: Diagnosis not present

## 2024-05-10 DIAGNOSIS — M84374A Stress fracture, right foot, initial encounter for fracture: Secondary | ICD-10-CM | POA: Diagnosis not present

## 2024-05-10 DIAGNOSIS — M7751 Other enthesopathy of right foot: Secondary | ICD-10-CM

## 2024-05-10 NOTE — Progress Notes (Signed)
 Subjective:   Patient ID: [name] [name], female   DOB: 58 y.o.   MRN: [id number]   HPI Chief Complaint  Patient presents with   Foot Pain    Pt is here due to right foot pain at the top of the foot started a week ago.   58 year old female presents the office with new complaints of pain top of her right foot which is very localized.  She is concerned that she has a lipoma.  She did not do a lot of walking recently as she was on a trip and then she has been on her feet a lot at home barefoot and then she started to notice this area.  She no specific injury that she can recall.  No other treatment.  She has no other concerns.   Review of Systems  All other systems reviewed and are negative.       Objective:  Physical Exam  General: AAO x3, NAD  Dermatological: No open lesions identified.  No erythema or warmth noted.   Vascular: Dorsalis Pedis artery and Posterior Tibial artery pedal pulses are 2/4 bilateral with immedate capillary fill time.  There is no pain with calf compression, swelling, warmth, erythema.   Neruologic: Grossly intact via light touch bilateral.   Musculoskeletal: On the fourth interspace area there is a localized area of edema that she has marked off.  Is well-circumscribed.  There is no fluctuation or crepitation.  This could represent a less area of swelling.  Not able to palpate any significant drainable collection.  She does get some tenderness on the fourth metatarsal as well.  Gait: Unassisted, Nonantalgic.       Assessment:   Right foot swelling, concern for stress fracture     Plan:  -Treatment options discussed including all alternatives, risks, and complications -Etiology of symptoms were discussed - X-ray obtained reviewed of the right foot.  3 views were obtained.  The fracture line is noted.  On the joint space is maintained.  Digital contractures present. -At this time recommend immobilization in a surgical shoe which was  dispensed. -Discussed icing, Voltaren topically -Ultrasound ordered to rule out soft tissue mass given history of lipomas.  Return for ultrasound .  [name] DPM        1.5 weekss ago Went to [place] and did more walking Had to use a cane yesterday Has been going to PT

## 2024-05-14 ENCOUNTER — Ambulatory Visit
Admission: RE | Admit: 2024-05-14 | Discharge: 2024-05-14 | Disposition: A | Discharge status: Home / Self Care | Payer: PRIVATE HEALTH INSURANCE | Source: Ambulatory Visit | Attending: Podiatry | Admitting: Podiatry

## 2024-05-14 DIAGNOSIS — M7989 Other specified soft tissue disorders: Secondary | ICD-10-CM

## 2024-05-15 ENCOUNTER — Ambulatory Visit: Payer: Self-pay | Admitting: Podiatry

## 2024-05-22 ENCOUNTER — Emergency Department (HOSPITAL_COMMUNITY)
Admission: EM | Admit: 2024-05-22 | Discharge: 2024-05-22 | Disposition: A | Discharge status: Home / Self Care | Payer: PRIVATE HEALTH INSURANCE | Attending: Emergency Medicine | Admitting: Emergency Medicine

## 2024-05-22 ENCOUNTER — Other Ambulatory Visit: Payer: Self-pay

## 2024-05-22 ENCOUNTER — Encounter (HOSPITAL_COMMUNITY): Payer: Self-pay

## 2024-05-22 DIAGNOSIS — I10 Essential (primary) hypertension: Secondary | ICD-10-CM | POA: Diagnosis not present

## 2024-05-22 DIAGNOSIS — J45909 Unspecified asthma, uncomplicated: Secondary | ICD-10-CM | POA: Diagnosis not present

## 2024-05-22 DIAGNOSIS — R519 Headache, unspecified: Secondary | ICD-10-CM | POA: Insufficient documentation

## 2024-05-22 DIAGNOSIS — R112 Nausea with vomiting, unspecified: Secondary | ICD-10-CM | POA: Diagnosis present

## 2024-05-22 DIAGNOSIS — Z7982 Long term (current) use of aspirin: Secondary | ICD-10-CM | POA: Insufficient documentation

## 2024-05-22 DIAGNOSIS — R109 Unspecified abdominal pain: Secondary | ICD-10-CM | POA: Diagnosis not present

## 2024-05-22 DIAGNOSIS — Z79899 Other long term (current) drug therapy: Secondary | ICD-10-CM | POA: Diagnosis not present

## 2024-05-22 LAB — CBC WITH DIFFERENTIAL/PLATELET
Abs Immature Granulocytes: 0.01 K/uL (ref 0.00–0.07)
Basophils Absolute: 0 K/uL (ref 0.0–0.1)
Basophils Relative: 0 %
Eosinophils Absolute: 0.1 K/uL (ref 0.0–0.5)
Eosinophils Relative: 1 %
HCT: 43.5 % (ref 36.0–46.0)
Hemoglobin: 14.2 g/dL (ref 12.0–15.0)
Immature Granulocytes: 0 %
Lymphocytes Relative: 24 %
Lymphs Abs: 2 K/uL (ref 0.7–4.0)
MCH: 31.5 pg (ref 26.0–34.0)
MCHC: 32.6 g/dL (ref 30.0–36.0)
MCV: 96.5 fL (ref 80.0–100.0)
Monocytes Absolute: 0.4 K/uL (ref 0.1–1.0)
Monocytes Relative: 5 %
Neutro Abs: 5.8 K/uL (ref 1.7–7.7)
Neutrophils Relative %: 70 %
Platelets: 289 K/uL (ref 150–400)
RBC: 4.51 MIL/uL (ref 3.87–5.11)
RDW: 13 % (ref 11.5–15.5)
WBC: 8.3 K/uL (ref 4.0–10.5)
nRBC: 0 % (ref 0.0–0.2)

## 2024-05-22 LAB — TROPONIN T, HIGH SENSITIVITY: Troponin T High Sensitivity: <15 ng/L (ref 0–19)

## 2024-05-22 LAB — COMPREHENSIVE METABOLIC PANEL WITH GFR
ALT: 15 U/L (ref 0–44)
AST: 18 U/L (ref 15–41)
Albumin: 4.5 g/dL (ref 3.5–5.0)
Alkaline Phosphatase: 78 U/L (ref 38–126)
Anion gap: 12 (ref 5–15)
BUN: 10 mg/dL (ref 6–20)
CO2: 25 mmol/L (ref 22–32)
Calcium: 9.3 mg/dL (ref 8.9–10.3)
Chloride: 105 mmol/L (ref 98–111)
Creatinine, Ser: 0.86 mg/dL (ref 0.44–1.00)
GFR, Estimated: >60 mL/min (ref >60)
Glucose, Bld: 87 mg/dL (ref 70–99)
Potassium: 4.5 mmol/L (ref 3.5–5.1)
Sodium: 141 mmol/L (ref 135–145)
Total Bilirubin: 0.5 mg/dL (ref 0.0–1.2)
Total Protein: 7 g/dL (ref 6.5–8.1)

## 2024-05-22 LAB — RESP PANEL BY RT-PCR (RSV, FLU A&B, COVID)  RVPGX2
Influenza A by PCR: NEGATIVE
Influenza B by PCR: NEGATIVE
Resp Syncytial Virus by PCR: NEGATIVE
SARS Coronavirus 2 by RT PCR: NEGATIVE

## 2024-05-22 MED ORDER — KETOROLAC TROMETHAMINE 30 MG/ML IJ SOLN
30.0000 mg | Freq: Once | INTRAMUSCULAR | Status: AC
  Administered 2024-05-22: 30 mg via INTRAVENOUS
  Filled 2024-05-22: qty 1

## 2024-05-22 MED ORDER — SODIUM CHLORIDE 0.9 % IV BOLUS
1000.0000 mL | Freq: Once | INTRAVENOUS | Status: AC
  Administered 2024-05-22: 1000 mL via INTRAVENOUS

## 2024-05-22 MED ORDER — ACETAMINOPHEN 500 MG PO TABS
1000.0000 mg | ORAL_TABLET | Freq: Once | ORAL | Status: AC
  Administered 2024-05-22: 1000 mg via ORAL
  Filled 2024-05-22: qty 2

## 2024-05-22 MED ORDER — ONDANSETRON HCL 4 MG/2ML IJ SOLN
4.0000 mg | Freq: Once | INTRAMUSCULAR | Status: AC
  Administered 2024-05-22: 4 mg via INTRAVENOUS
  Filled 2024-05-22: qty 2

## 2024-05-22 NOTE — ED Notes (Signed)
 Pt in bed, pt reports that she is feeling better, reports 5/10 headache, water given for po challenge.

## 2024-05-22 NOTE — Discharge Instructions (Addendum)
 Evaluation for your headache and nausea and vomiting here were overall reassuring.  Labs are normal.  If your symptoms worsen please return to the ED for further evaluation otherwise recommend PCP follow-up.  Would also recommend Zofran  Tylenol  and ibuprofen at home for your symptoms if needed.  Please continue assertive hydration at home with water and Gatorade.

## 2024-05-22 NOTE — ED Notes (Signed)
 Pt in bed, pt reports a slight decrease in pain, pt states that she doesn't need anything more for pain at this time.

## 2024-05-22 NOTE — ED Notes (Signed)
Pt in bed with eyes closed, resps even and unlabored.  

## 2024-05-22 NOTE — ED Notes (Signed)
 Pt in bed, pt reports slight nausea and pain, pt states that overall she is doing better.  Pt denies vomiting.

## 2024-05-22 NOTE — ED Notes (Signed)
 Pt in bed, pt reports 5/10 headache, pt states that she is ready to go home, read and reviewed d/c instructions and follow up, advised to return for any concerns or worsening symptoms. Pt ambulatory from department with steady gait.

## 2024-05-22 NOTE — ED Provider Notes (Signed)
 [place] EMERGENCY DEPARTMENT AT [facility] Provider Note   CSN: [id number] Arrival date & time: 05/22/24  1112     Patient presents with: Emesis  HPI [name] [name] is a 58 y.o. female with hypertension, asthma, Dercum's disease presenting for emesis and headache.  Started this morning around 3 AM.  Endorsing associated headache, right sided chest pain and nausea.  Also endorsing abdominal cramping.  She reports symptoms started after administration of Zepbound at home.  She states she was seen for the exact same symptoms about a month ago Darryle Law.  She denies fever.  She states the chest pain is resolved and denies shortness of breath at this time.  It started shortly after she was vomiting last night.    Emesis      Prior to Admission medications   Medication Sig Start Date End Date Taking? Authorizing Provider  albuterol  (VENTOLIN  HFA) 108 (90 Base) MCG/ACT inhaler Inhale 2 puffs into the lungs every 4 (four) hours as needed. 09/07/23   [provider]  aspirin  EC 81 MG tablet Take 81 mg by mouth daily. Swallow whole.    [provider]  Ciclopirox  1 % shampoo Apply 1 Application topically as needed. 02/15/23   [provider]  clindamycin (CLEOCIN T) 1 % lotion Apply 1 Application topically 2 (two) times daily.    [provider]  clobetasol (TEMOVATE) 0.05 % external solution Apply 1 Application topically daily. 02/15/23   [provider]  Cyanocobalamin (VITAMIN B-12 IJ) Inject 1 Dose as directed every 30 (thirty) days.    [provider]  diclofenac Sodium (VOLTAREN) 1 % GEL Apply 2 g topically as needed. 02/16/23   [provider]  EPINEPHrine  (NEFFY ) 2 MG/0.1ML SOLN Place 1 Dose into the nose as needed (anaphylactic reaction). 04/16/24   Luke Orlan HERO, DO  EPINEPHrine  0.3 mg/0.3 mL IJ SOAJ injection Inject 0.3 mg into the muscle as needed for anaphylaxis. 04/16/24   Luke Orlan HERO, DO  ezetimibe   (ZETIA ) 10 MG tablet Take 1 tablet (10 mg total) by mouth daily. 11/10/23   Jeffrie Oneil BROCKS, MD  fexofenadine  (ALLEGRA ) 180 MG tablet Take 1 tablet (180 mg total) by mouth 2 (two) times daily as needed for allergies or rhinitis. Patient taking differently: Take 180 mg by mouth as needed for allergies or rhinitis. 09/20/23   Luke Orlan HERO, DO  HYDROcodone -acetaminophen  (HYCET) 7.5-325 mg/15 ml solution Take 15 mLs by mouth every 6 (six) hours as needed for severe pain (pain score 7-10). 03/15/24 03/15/25  Doretha Folks, MD  hydrocortisone 2.5 % cream Apply 1 Application topically as needed.    [provider]  icosapent  Ethyl (VASCEPA ) 1 g capsule Take 2 capsules (2 g total) by mouth 2 (two) times daily. Patient taking differently: Take 2 g by mouth at bedtime. 11/10/23   Jeffrie Oneil BROCKS, MD  ipratropium (ATROVENT ) 0.03 % nasal spray Place 1-2 sprays into both nostrils 2 (two) times daily as needed (nasal drainage). 04/16/24   Luke Orlan HERO, DO  ketoconazole  (NIZORAL ) 2 % cream Apply 1 Application topically daily. Patient taking differently: Apply 1 Application topically as needed for irritation. 02/14/23   Gershon Donnice SAUNDERS, DPM  ketorolac  (TORADOL ) 10 MG tablet Take 1 tablet (10 mg total) by mouth every 6 (six) hours as needed. 02/23/24   Patt Alm Macho, MD  levocetirizine (XYZAL ) 5 MG tablet Take 1 tablet (5 mg total) by mouth every evening. 01/03/24   Luke Orlan HERO,  DO  metroNIDAZOLE  (METROGEL ) 0.75 % gel Apply 1 Application topically as needed.    [provider]  olmesartan  (BENICAR ) 20 MG tablet Take 20 mg by mouth daily. 11/03/18   [provider]  Olopatadine -Mometasone  (RYALTRIS ) [id number] MCG/ACT SUSP Place 1-2 sprays into the nose in the morning and at bedtime. 04/16/24   Luke Orlan HERO, DO  Omeprazole-Sodium Bicarbonate (ZEGERID PO) Take 1 tablet by mouth as needed.    [provider]  ondansetron  (ZOFRAN -ODT) 4 MG disintegrating tablet Take 4 mg by mouth as needed for  nausea or vomiting. 12/15/22   [provider]  RABEprazole (ACIPHEX) 20 MG tablet Take 20 mg by mouth daily.    [provider]  rizatriptan  (MAXALT -MLT) 10 MG disintegrating tablet Take 10 mg by mouth every 2 (two) hours as needed for migraine.  06/18/15   [provider]  rosuvastatin  (CRESTOR ) 5 MG tablet Take 1 tablet (5 mg total) by mouth daily. 11/10/23   Jeffrie Oneil BROCKS, MD  scopolamine  (TRANSDERM-SCOP) 1 MG/3DAYS Place 1 patch (1.5 mg total) onto the skin every 3 (three) days. Patient taking differently: Place 0.5 patches onto the skin as needed. 12/17/22   Barbarann Nest, MD  Vitamin D, Ergocalciferol, (DRISDOL) 50000 units CAPS capsule Take 50,000 Units by mouth every 7 (seven) days.    [provider]  ZEPBOUND 5 MG/0.5ML Pen Inject 5 mg into the skin once a week. 02/17/24   [provider]    Allergies: Augmentin [amoxicillin-pot clavulanate], Betadine [povidone iodine], Contrast media [iodinated contrast media], Morphine and codeine, Prednisone , Yellow jacket venom [bee venom], Azithromycin, Erythromycin, Iohexol , Oxycontin [oxycodone], Avelox [moxifloxacin hcl in nacl], Codeine, Gadolinium derivatives, Iodine, Lamisil  [terbinafine ], Levaquin [levofloxacin in d5w], Oxycodone-acetaminophen , Pantoprazole , Praluent  [alirocumab ], Pravastatin , Rosuvastatin , Shellfish allergy , Dilaudid [hydromorphone hcl], Oxycodone hcl, and Tramadol    Review of Systems  Gastrointestinal:  Positive for vomiting.    Physical Exam   Vitals:   05/22/24 1445 05/22/24 1516  BP: 131/86 115/82  Pulse: 75 84  Resp: 13 17  Temp:  97.8 F (36.6 C)  SpO2: 97% 100%    CONSTITUTIONAL:  well-appearing, NAD NEURO:  Alert and oriented x 3, CN 3-12 grossly intact EYES:  eyes equal and reactive ENT/NECK:  Supple, no stridor  CARDIO:  regular rate and rhythm, appears well-perfused  PULM:  No respiratory distress, CTAB GI/GU:  non-distended, soft, non tender MSK/SPINE:   No gross deformities, no edema, moves all extremities  SKIN:  no rash, atraumatic  *Additional and/or pertinent findings included in MDM below    (all labs ordered are listed, but only abnormal results are displayed) Labs Reviewed  RESP PANEL BY RT-PCR (RSV, FLU A&B, COVID)  [id number]  CBC WITH DIFFERENTIAL/PLATELET  COMPREHENSIVE METABOLIC PANEL WITH GFR  TROPONIN T, HIGH SENSITIVITY    EKG: EKG Interpretation Date/Time:  Tuesday May 22 2024 11:54:29 EDT Ventricular Rate:  74 PR Interval:  190 QRS Duration:  86 QT Interval:  383 QTC Calculation: 425 R Axis:   10  Text Interpretation: Sinus rhythm Low voltage, precordial leads Confirmed by Suzette Pac [phone]) on 05/22/2024 12:59:05 PM  Radiology: No results found.   Procedures   Medications Ordered in the ED  sodium chloride  0.9 % bolus 1,000 mL (0 mLs Intravenous Stopped 05/22/24 1325)  ketorolac  (TORADOL ) 30 MG/ML injection 30 mg (30 mg Intravenous Given 05/22/24 1158)  ondansetron  (ZOFRAN ) injection 4 mg (4 mg Intravenous Given 05/22/24 1156)  sodium chloride  0.9 % bolus 1,000 mL (0 mLs Intravenous  Stopped 05/22/24 1457)  ondansetron  (ZOFRAN ) injection 4 mg (4 mg Intravenous Given 05/22/24 1454)  acetaminophen  (TYLENOL ) tablet 1,000 mg (1,000 mg Oral Given 05/22/24 1453)                                    Medical Decision Making Amount and/or Complexity of Data Reviewed Labs: ordered.  Risk OTC drugs. Prescription drug management.   Initial Impression and Ddx 58 year old well-appearing female presenting for nausea, vomiting and headache.  Exam was unremarkable.  DDx includes appendicitis, acute cholecystitis, bowel obstruction, adverse reaction to Zepbound, electrolyte derangement, ACS, ICH, migraine, stroke, other. Patient PMH that increases complexity of ED encounter:  hypertension, asthma, Dercum's disease   Interpretation of Diagnostics - I independent reviewed and interpreted the labs as  followed: normal labs  - I personally reviewed and interpreted EKG which revealed sinus rhythm  Patient Reassessment and Ultimate Disposition/Management Workup largely reassuring.  On reassessment she stated her headache and symptoms have improved considerably.  No chest pain on reassessment.  Suspect symptoms today could be related to Zepbound use.  Advised her to follow-up with her PCP.  Patient assured me that she had Zofran  at home.  We did discussed return precautions.  Overall she looks well, nontoxic no acute distress and hemodynamically stable.  Feel she is safe for discharge.  Discharged good condition.  Patient management required discussion with the following services or consulting groups:  None  Complexity of Problems Addressed Acute complicated illness or Injury  Additional Data Reviewed and Analyzed Further history obtained from: Past medical history and medications listed in the EMR and Prior ED visit notes  Patient Encounter Risk Assessment Consideration of hospitalization      Final diagnoses:  Nausea and vomiting, unspecified vomiting type  Nonintractable headache, unspecified chronicity pattern, unspecified headache type    ED Discharge Orders     None          Lang Norleen POUR, PA-C 05/22/24 JUDITHANN Suzette Pac, MD 06/01/24 1250

## 2024-05-22 NOTE — ED Triage Notes (Signed)
 Pt to er, pt states that she was at [facility] for the same thing about a month ago, states that she was up all night with headache, chest pain, vomiting.

## 2024-06-01 ENCOUNTER — Encounter: Payer: Self-pay | Admitting: Podiatry

## 2024-06-01 ENCOUNTER — Ambulatory Visit: Payer: PRIVATE HEALTH INSURANCE | Admitting: Podiatry

## 2024-06-01 DIAGNOSIS — M7989 Other specified soft tissue disorders: Secondary | ICD-10-CM

## 2024-06-01 DIAGNOSIS — L600 Ingrowing nail: Secondary | ICD-10-CM

## 2024-06-01 NOTE — Progress Notes (Signed)
 Subjective:   Patient ID: [name] [name], female   DOB: 58 y.o.   MRN: [id number]   HPI Chief Complaint  Patient presents with   Ingrown Toenail    Patient is here for trim    58 year old female presents the office today for follow up evaluation of pain top of her right foot which is very localized.  She states the pain is intermittent.  At times also sharp pain.  She also states that she needs her nails trimmed as they are getting ingrown most on the right lateral nail border she has tenderness to the nail corners.  Denies any swelling, redness or any drainage.   Review of Systems  All other systems reviewed and are negative.       Objective:  Physical Exam  General: AAO x3, NAD  Dermatological: Incurvation present to right lateral nail border localized edema.  There is no drainage or pus or ascending cellulitis.  There is a general mildly hypertrophic, dystrophic and elongated she has discomfort.  No open lesions.    Vascular: Dorsalis Pedis artery and Posterior Tibial artery pedal pulses are 2/4 bilateral with immedate capillary fill time.  There is no pain with calf compression, swelling, warmth, erythema.   Neruologic: Grossly intact via light touch bilateral.   Musculoskeletal: On the fourth interspace area there is a localized area of edema. There is no erythema or warmth.  There is tenderness palpation of this area.        Assessment:   Soft tissue mass, localized edema; ingrown toenail     Plan:  -Treatment options discussed including all alternatives, risks, and complications -Etiology of symptoms were discussed  Soft tissue mass; edema -We discussed getting a MRI with contrast to further evaluate however we usually prefer to start a steroid injection first.  Clean skin with alcohol no mixture of 0.5 cc of Marcaine  plain, 0.5 cc of dexamethasone  phosphate was infiltrated into the area without complications.  This along the fourth interspace.  Tolerated  well.  Discussed icing.  Ingrown toenail - Sharp debride the corresponding complications or bleeding today.  Discussed Epsom salt soaks and antibiotic ointment.  Monitor any signs or symptoms of infection or any worsening.  Should this occur we will need to proceed with partial nail avulsion.  [name] [name] DPM

## 2024-06-05 ENCOUNTER — Ambulatory Visit: Payer: PRIVATE HEALTH INSURANCE | Admitting: Podiatry

## 2024-06-26 ENCOUNTER — Other Ambulatory Visit: Payer: Self-pay | Admitting: Allergy

## 2024-07-23 ENCOUNTER — Ambulatory Visit: Payer: PRIVATE HEALTH INSURANCE | Admitting: Podiatry

## 2024-07-23 DIAGNOSIS — M7989 Other specified soft tissue disorders: Secondary | ICD-10-CM

## 2024-07-23 DIAGNOSIS — L600 Ingrowing nail: Secondary | ICD-10-CM | POA: Diagnosis not present

## 2024-07-23 NOTE — Progress Notes (Signed)
 Subjective:   Patient ID: [name] [name], female   DOB: 58 y.o.   MRN: [id number]   HPI Chief Complaint  Patient presents with   RFC    Non diabetic foot care      58 year old female presents the office today for follow up evaluation of pain top of her right foot which is very localized.  She said the injection did help reduce the inflammation but if she is on her feet for a long time she feels a sharp pain to the area.  No recent injuries or changes otherwise.  She still gets about her toenails as they grow out how they are looking.  She does get ingrown toe on the right big toe after she does a lot of walking recently on a trip she noticed it becoming more sore.  No drainage or pus.  Review of Systems  All other systems reviewed and are negative.       Objective:  Physical Exam  General: AAO x3, NAD  Dermatological: Incurvation present to right lateral nail border localized edema.  There is no drainage or pus or ascending cellulitis.  Overall the other toenails are also mildly hypertrophic, dystrophic and elongated she has discomfort.  No open lesions.    Vascular: Dorsalis Pedis artery and Posterior Tibial artery pedal pulses are 2/4 bilateral with immedate capillary fill time.  There is no pain with calf compression, swelling, warmth, erythema.   Neruologic: Grossly intact via light touch bilateral.   Musculoskeletal: On the fourth interspace area there is a localized area of edema but in removed.  No significant pain today.  There is no area of pinpoint tenderness.          Assessment:   Soft tissue mass, localized edema; ingrown toenail     Plan:  -Treatment options discussed including all alternatives, risks, and complications -Etiology of symptoms were discussed  Soft tissue mass; edema - Injection did help.  Discussed repeat injection if needed in the future.  Continue supportive shoe gear to help offload.  Ingrown toenail/nail dystrophy - Sharp  debride the ingrown portion of the right hallux toenail today without any complications or bleeding today.  Discussed Epsom salt soaks and antibiotic ointment.  Monitor any signs or symptoms of infection or any worsening.  Should this occur we will need to proceed with partial nail avulsion. - As a courtesy debrided of the callus and complications of bleeding.  [name] [name] DPM

## 2025-04-17 ENCOUNTER — Ambulatory Visit: Payer: PRIVATE HEALTH INSURANCE | Admitting: Allergy

## 2025-04-22 ENCOUNTER — Ambulatory Visit: Payer: PRIVATE HEALTH INSURANCE | Admitting: Neurology
